# Patient Record
Sex: Male | Born: 1990
Health system: Southern US, Community
[De-identification: ages and names within clinical notes are randomized; demographics above are authoritative.]

## PROBLEM LIST (undated history)

## (undated) DIAGNOSIS — E119 Type 2 diabetes mellitus without complications: Secondary | ICD-10-CM

## (undated) DIAGNOSIS — I10 Essential (primary) hypertension: Secondary | ICD-10-CM

## (undated) DIAGNOSIS — F1410 Cocaine abuse, uncomplicated: Secondary | ICD-10-CM

## (undated) DIAGNOSIS — K6289 Other specified diseases of anus and rectum: Secondary | ICD-10-CM

## (undated) DIAGNOSIS — K219 Gastro-esophageal reflux disease without esophagitis: Secondary | ICD-10-CM

## (undated) DIAGNOSIS — Z21 Asymptomatic human immunodeficiency virus [HIV] infection status: Secondary | ICD-10-CM

## (undated) DIAGNOSIS — B20 Human immunodeficiency virus [HIV] disease: Secondary | ICD-10-CM

## (undated) HISTORY — DX: Type 2 diabetes mellitus without complications: E11.9

## (undated) HISTORY — DX: Essential (primary) hypertension: I10

## (undated) HISTORY — PX: NO PAST SURGERIES: SHX2092

---

## 2010-03-16 ENCOUNTER — Encounter: Admission: RE | Admit: 2010-03-16 | Discharge: 2010-03-16 | Payer: Self-pay | Admitting: Family Medicine

## 2011-11-15 IMAGING — CT CT ABD-PELV W/ CM
2 of 4 series · 14 of 32 positions shown, 19 images · IV contrast (30CC OMNI 300 & [ID] OMNI 300)
Comparison: None.

CLINICAL DATA: Abdominal pain and bloating, evaluate for
obstruction

CT ABDOMEN AND PELVIS WITH CONTRAST
TECHNIQUE: Multidetector CT imaging of the abdomen and pelvis was
performed following the standard protocol during bolus
administration of intravenous contrast.
Contrast: 100 ml 5mnipaque-IEE

[Series 2: abdomen w/ · axial · 0.62mm/px · z∈[-305,+30]mm · 7 of 91 slices shown, 12 images]
[im 12/91  soft-tissue]
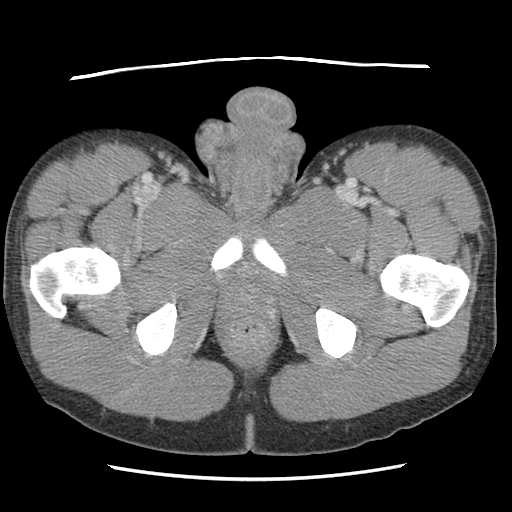
[im 12/91  bone]
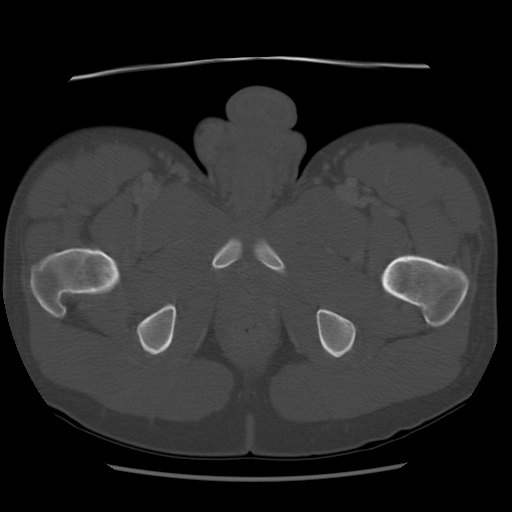
[im 23/91  soft-tissue]
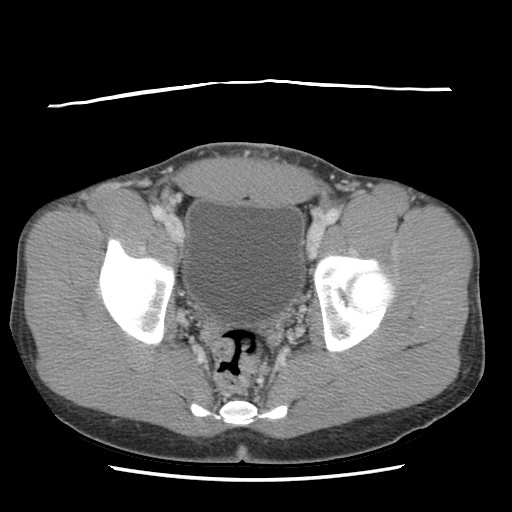
[im 34/91  soft-tissue]
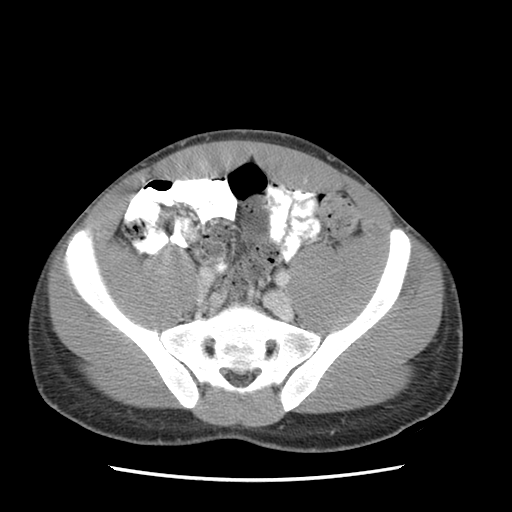
[im 46/91  soft-tissue]
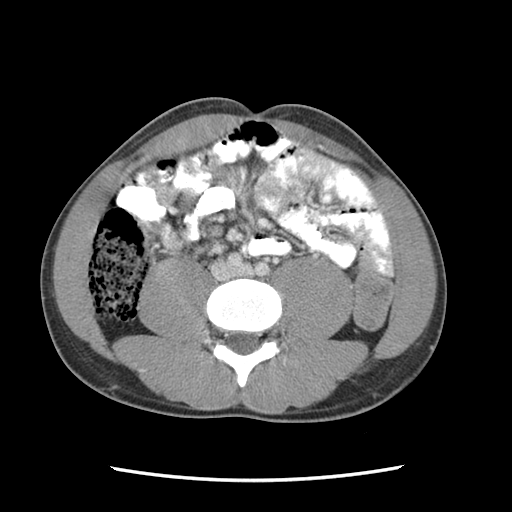
[im 46/91  lung]
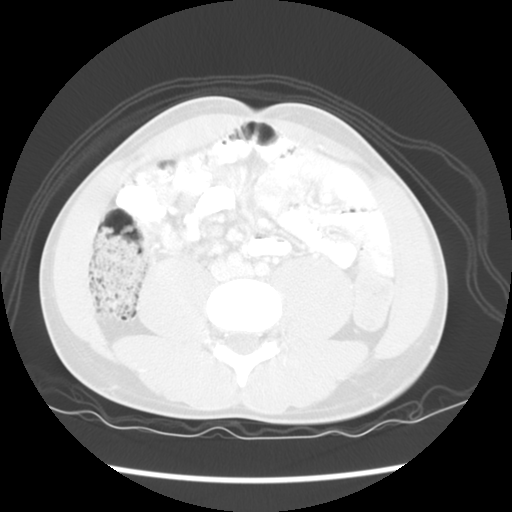
[im 57/91  soft-tissue]
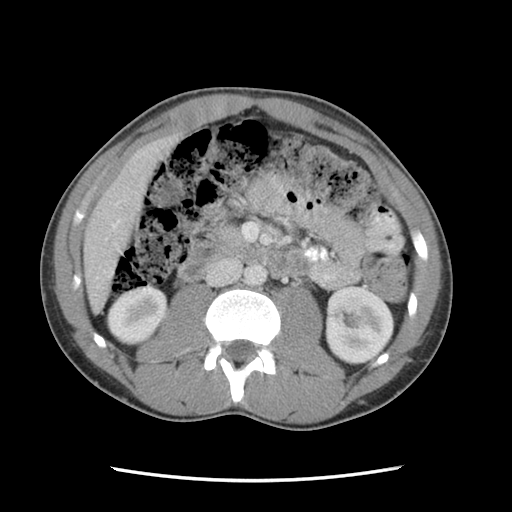
[im 57/91  lung]
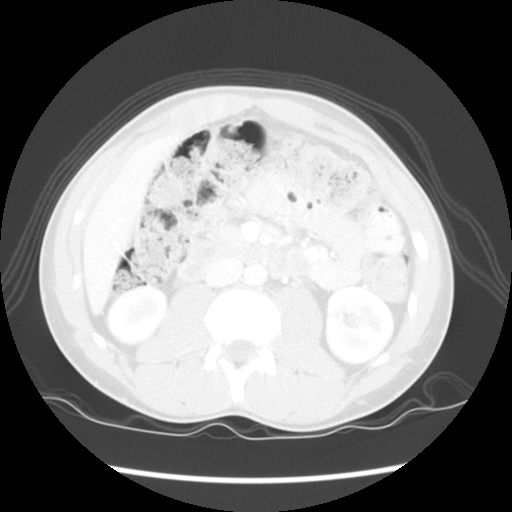
[im 68/91  soft-tissue]
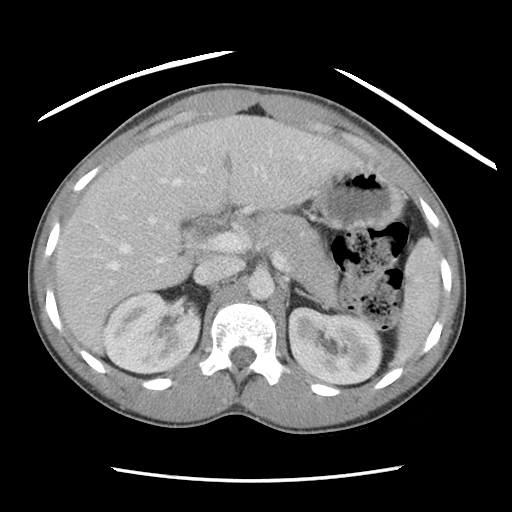
[im 68/91  lung]
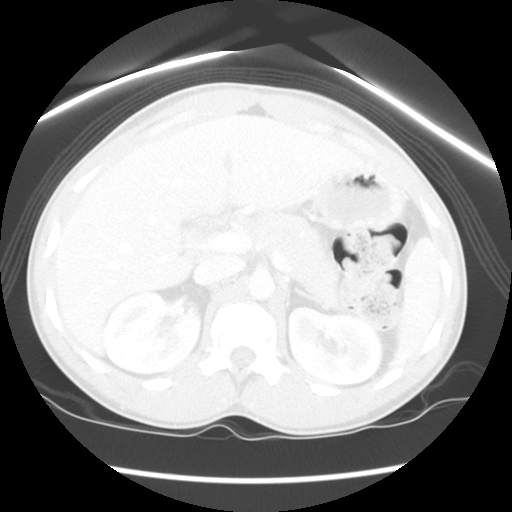
[im 79/91  soft-tissue]
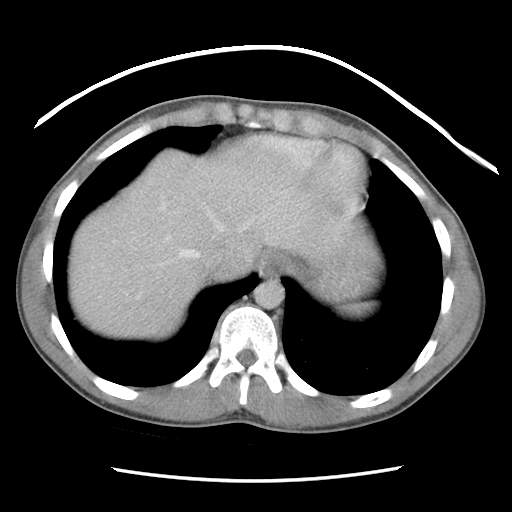
[im 79/91  lung]
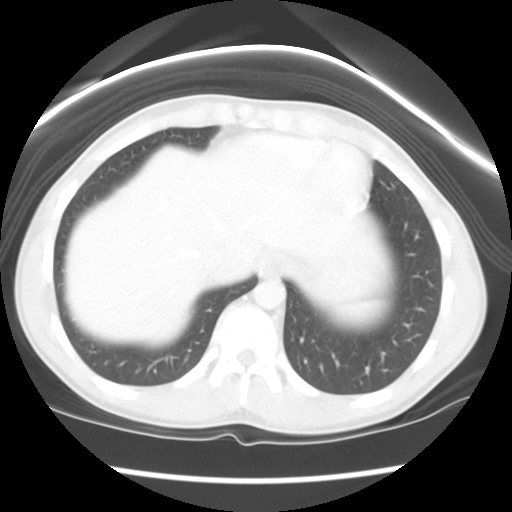

[Series 400: sagittal · sagittal · 0.90mm/px · 7 of 106 slices shown]
[im 11/106  soft-tissue]
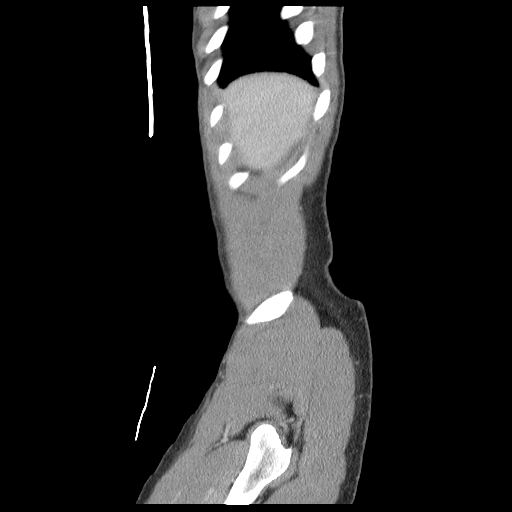
[im 22/106  soft-tissue]
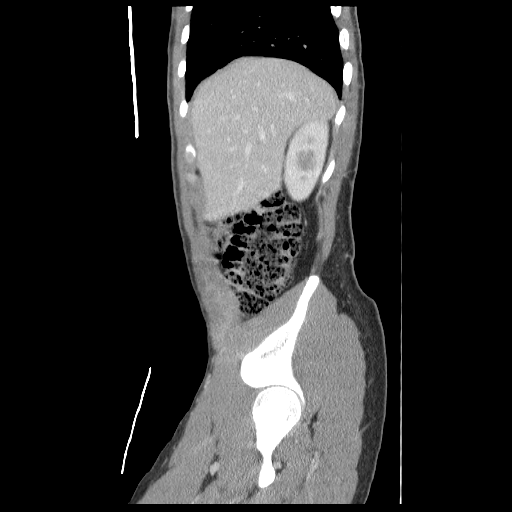
[im 32/106  soft-tissue]
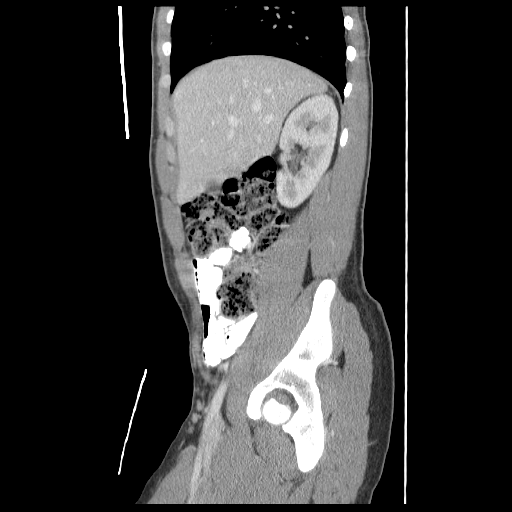
[im 43/106  soft-tissue]
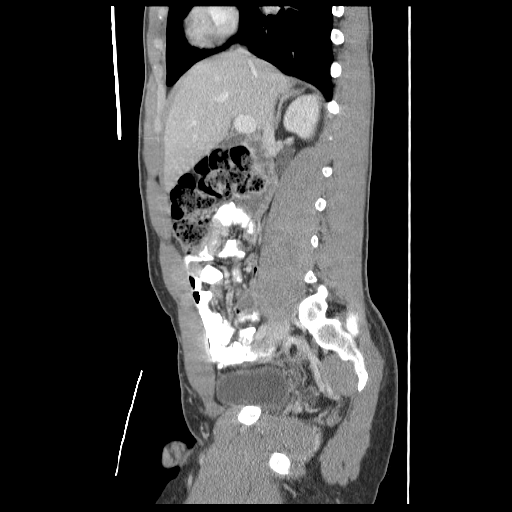
[im 64/106  soft-tissue]
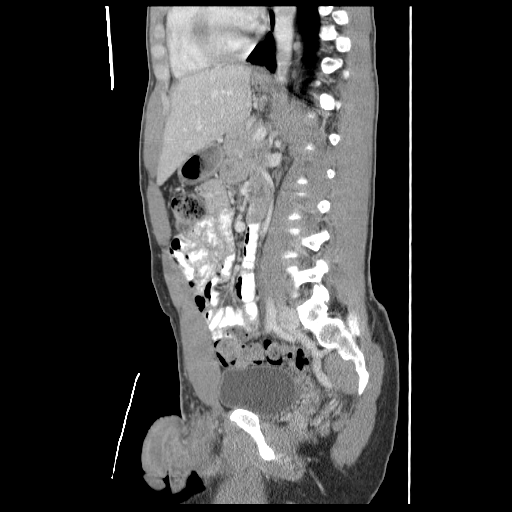
[im 74/106  soft-tissue]
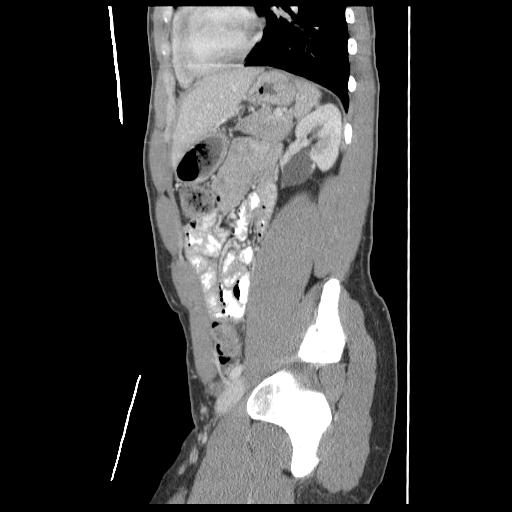
[im 85/106  soft-tissue]
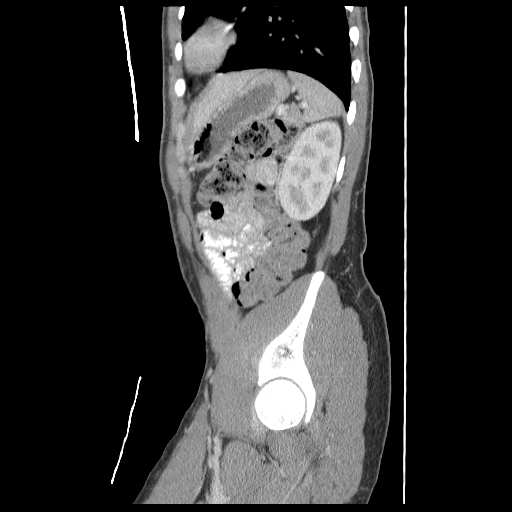

[14 of 32 positions shown; findings below may reference images not displayed]

FINDINGS: The lung bases are clear.  Probable small hemangioma is
noted in the lateral segment left lobe of liver near the dome.  No
ductal dilatation is seen.  No calcified gallstones are noted.  The
pancreas is normal in size and the pancreatic duct is not dilated.
The adrenal glands and spleen are unremarkable.  The stomach is not
well distended but is unremarkable.  The kidneys enhance with no
calculus or mass.  Slight prominence of the pelvocaliceal systems
is noted of questionable significance.  The abdominal aorta is
normal in caliber.  No adenopathy is seen.

There is a moderate amount of feces throughout the entire colon.
No small bowel obstruction is evident.  The appendix fills with air
in the right lower quadrant and appears normal.  There is some
fecalization of distal small bowel loops.  Without evidence of
obstruction this finding may indicate slow transit time.  The
urinary bladder is unremarkable.  The prostate is normal in size.
No fluid is seen within the pelvis.  No bony abnormality is seen.
IMPRESSION: 1.  Moderate amount of feces throughout the colon.  No evidence of
bowel obstruction.
2.  Some fecalization of distal small bowel loops.  With no
evidence of obstruction, this may indicate slow transit time
through the small bowel.
3.  Slight prominence of the pelvocaliceal systems of doubtful
significance.  No renal calculi.
4.  The appendix appears normal.

## 2012-02-27 ENCOUNTER — Ambulatory Visit (INDEPENDENT_AMBULATORY_CARE_PROVIDER_SITE_OTHER): Payer: BC Managed Care – PPO | Admitting: Family Medicine

## 2012-02-27 ENCOUNTER — Ambulatory Visit: Payer: BC Managed Care – PPO

## 2012-02-27 VITALS — BP 110/60 | HR 64 | Temp 98.3°F | Resp 16 | Ht 65.5 in | Wt 118.4 lb

## 2012-02-27 DIAGNOSIS — S99929A Unspecified injury of unspecified foot, initial encounter: Secondary | ICD-10-CM

## 2012-02-27 DIAGNOSIS — IMO0002 Reserved for concepts with insufficient information to code with codable children: Secondary | ICD-10-CM

## 2012-02-27 DIAGNOSIS — S8990XA Unspecified injury of unspecified lower leg, initial encounter: Secondary | ICD-10-CM

## 2012-02-27 MED ORDER — SULFAMETHOXAZOLE-TRIMETHOPRIM 800-160 MG PO TABS: 1.0000 | ORAL_TABLET | Freq: Two times a day (BID) | ORAL | Status: DC

## 2012-02-27 NOTE — Progress Notes (Signed)
Subjective: 21 year old male who dropped a bed frame on the right fifth toe Friday. Over the past 3 days worse. He's been soaking in Epsom salts. It's painful.  Objective: Swollen right fifth toe with paronychia on the medial aspect of the base of the nail.  Assessment: Fifth toe pain and infection  Plan: X-ray Plan to drain the paronychia.  I and D done with ethyl chloride to reduce pain. Culture taken

## 2012-02-27 NOTE — Patient Instructions (Addendum)
Try to elevate foot when possible.  Take full course of antibiotics over next 10 days.    Thursday before 3 PM  return for recheck

## 2012-03-01 LAB — WOUND CULTURE: Gram Stain: NONE SEEN

## 2013-08-08 ENCOUNTER — Emergency Department (INDEPENDENT_AMBULATORY_CARE_PROVIDER_SITE_OTHER)
Admission: EM | Admit: 2013-08-08 | Discharge: 2013-08-08 | Disposition: A | Discharge status: Home / Self Care | Payer: BC Managed Care – PPO | Source: Home / Self Care | Attending: Emergency Medicine | Admitting: Emergency Medicine

## 2013-08-08 ENCOUNTER — Encounter (HOSPITAL_COMMUNITY): Payer: Self-pay | Admitting: Emergency Medicine

## 2013-08-08 ENCOUNTER — Other Ambulatory Visit (HOSPITAL_COMMUNITY)
Admission: RE | Admit: 2013-08-08 | Discharge: 2013-08-08 | Disposition: A | Discharge status: Home / Self Care | Payer: BC Managed Care – PPO | Source: Ambulatory Visit | Attending: Emergency Medicine | Admitting: Emergency Medicine

## 2013-08-08 DIAGNOSIS — Z113 Encounter for screening for infections with a predominantly sexual mode of transmission: Secondary | ICD-10-CM | POA: Insufficient documentation

## 2013-08-08 DIAGNOSIS — Z202 Contact with and (suspected) exposure to infections with a predominantly sexual mode of transmission: Secondary | ICD-10-CM

## 2013-08-08 HISTORY — DX: Human immunodeficiency virus (HIV) disease: B20

## 2013-08-08 HISTORY — DX: Asymptomatic human immunodeficiency virus (hiv) infection status: Z21

## 2013-08-08 MED ORDER — CEFTRIAXONE SODIUM 250 MG IJ SOLR
250.0000 mg | Freq: Once | INTRAMUSCULAR | Status: AC
  Administered 2013-08-08: 250 mg via INTRAMUSCULAR

## 2013-08-08 MED ORDER — AZITHROMYCIN 250 MG PO TABS
ORAL_TABLET | ORAL | Status: AC
  Filled 2013-08-08: qty 4

## 2013-08-08 MED ORDER — AZITHROMYCIN 250 MG PO TABS
1000.0000 mg | ORAL_TABLET | Freq: Once | ORAL | Status: AC
  Administered 2013-08-08: 1000 mg via ORAL

## 2013-08-08 MED ORDER — LIDOCAINE HCL (PF) 1 % IJ SOLN
INTRAMUSCULAR | Status: AC
  Filled 2013-08-08: qty 5

## 2013-08-08 MED ORDER — CEFTRIAXONE SODIUM 250 MG IJ SOLR
INTRAMUSCULAR | Status: AC
  Filled 2013-08-08: qty 250

## 2013-08-08 NOTE — Discharge Instructions (Signed)
You have been diagnosed with a possible STD.  Your results should be back in  1 - 3 days.  We will call you with the results of any positive tests, so if you don't hear from us, you can assume your results are all negative.  If you wish, you can call us here at [phone] and ask for a nurse to give you the results.  They can give you the results of all your tests over the phone.  If your HIV should come back positive, we must give you this result in person to protect your confidentiality.  We can give you a negative HIV result over the phone.   ° °In the meantime, you should avoid intercourse altogether for 1 week.  After that, you should always use condoms--100% of the time.  This will not only prevent pregnancy, but has been shown to prevent HIV, syphilis, gonorrhea, chlamydia, hepatis C and other STDs. ° °If your test comes back positive, we are required by law to report it to the Health Department.  We also suggest you inform your partner or partners so they can get tested and treated as well. ° °You can get STD testing for free at the [facility].  It is recommended that you have repeat testing for HIV and syphilis in 3 and 6 months, since it can take a while for these tests to become positive.  ° °

## 2013-08-08 NOTE — ED Provider Notes (Signed)
  Chief Complaint   Chief Complaint  Patient presents with  . Exposure to STD    History of Present Illness   Cory Russell is -year-old male who is in tonight with his partner. His partner has had some urethral discharge. The patient denies any urethral symptoms but has had some sore throat. He denies any fever or chills. He's had no skin rash or joint pain. No dysuria, penile lesions, inguinal adenopathy, or testicular pain or swelling. He denies any previous STDs. He is HIV positive and is on Stribild. He is being followed by infectious disease specialist in [place], [place] and has not transferred his care here yet.  Review of Systems   Other than as noted above, the patient denies any of the following symptoms: Systemic:  No fevers chills, arthralgias, or adenopathy. GI:  No abdominal pain, nausea or vomiting. GU:  No dysuria, penile pain, discharge, itching, dysuria, genital lesions, testicular pain or swelling. Skin:  No rash or itching.  PMFSH   Past medical history, family history, social history, meds, and allergies were reviewed.   Physical Examination    Vital signs:  BP 128/85  Pulse 63  Temp(Src) 98 F (36.7 C) (Oral)  Resp 16  SpO2 100% Gen:  Alert, oriented, in no distress. ENT: Tonsils are enlarged and red without any exudate. Abdomen:  Soft and flat, non-distended, and non-tender.  No organomegaly or mass. Genital:  No urethral discharge, no genital lesions, no inguinal adenopathy, testes are normal. Skin:  Warm and dry.  No rash.   Labs   Throat swab was obtained for DNA probe for gonorrhea and Chlamydia, and urine obtained for DNA probe for gonorrhea, Chlamydia, Trichomonas.  Course in Urgent Care Center   Given Rocephin 250 mg IM and azithromycin 1000 mg by mouth.   Assessment   The encounter diagnosis was Exposure to STD.  Plan    1.  Meds:  The following meds were prescribed:   New Prescriptions   No medications on file    2.   Patient Education/Counseling:  The patient was given appropriate handouts, self care instructions, and instructed in symptomatic relief.The patient was instructed to inform all sexual contacts, avoid intercourse completely for 2 weeks and then only with a condom.  The patient was told that we would call about all abnormal lab results, and that we would need to report certain kinds of infection to the health department.    3.  Follow up:  The patient was told to follow up here if no better in 3 to 4 days, or sooner if becoming worse in any way, and given some red flag symptoms such as fever, pain, or difficulty urinating which would prompt immediate return.       Reuben Likesavid C Holdan Stucke, MD 08/08/13 2032

## 2013-08-08 NOTE — ED Notes (Signed)
Denies any symptoms, possible exoposure

## 2013-08-09 LAB — GC/CHLAMYDIA PROBE AMP
CT PROBE, AMP APTIMA: NEGATIVE
GC Probe RNA: NEGATIVE

## 2013-08-09 LAB — URINE CYTOLOGY ANCILLARY ONLY
CHLAMYDIA, DNA PROBE: NEGATIVE
Neisseria Gonorrhea: NEGATIVE
Trichomonas: NEGATIVE

## 2013-08-09 NOTE — Progress Notes (Signed)
Quick Note:  Test result was normal. No further action is needed at this time. ______ 

## 2013-10-28 IMAGING — CR DG TOE 5TH 2+V*R*
1 series · 1 of 1 positions shown · non-contrast
Comparison: None.

CLINICAL DATA: Right small toe pain.

RIGHT FIFTH TOE

[AP]
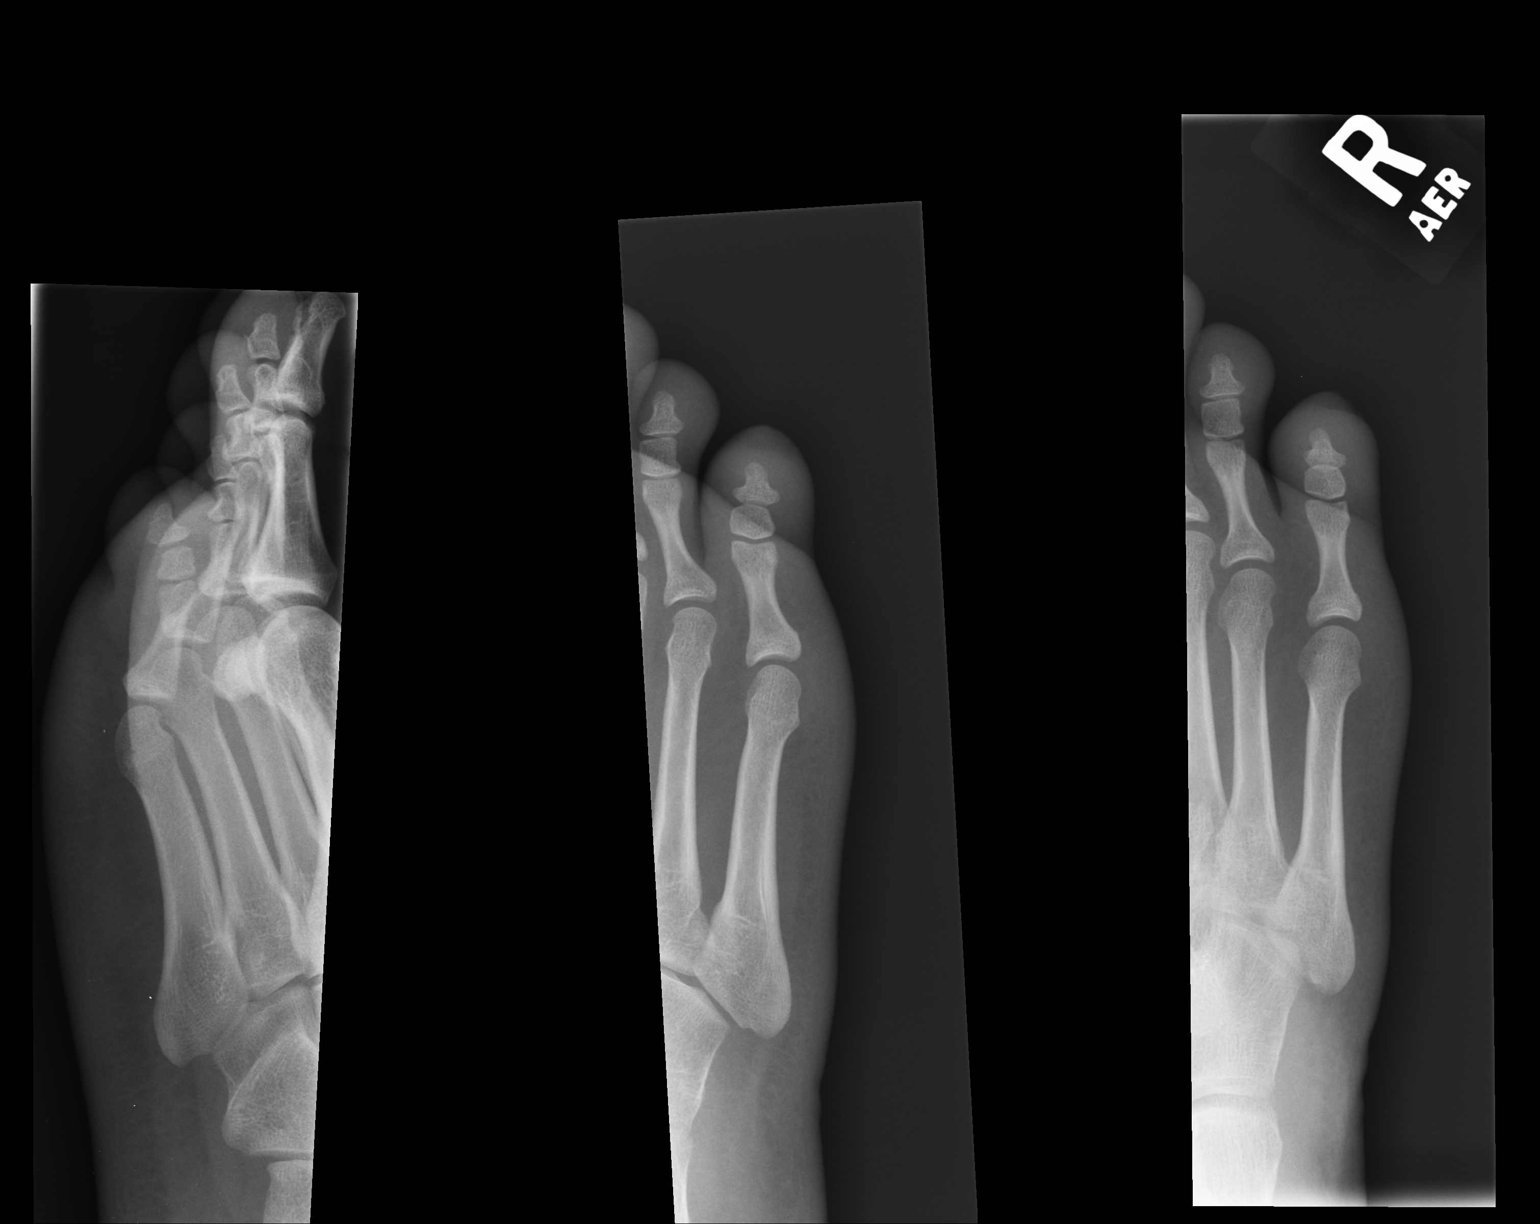

[1 of 1 positions shown; findings below may reference images not displayed]

FINDINGS: Anatomic alignment.  No fracture.  Soft tissue swelling
along the right small toe.  Phalanges appear intact.
IMPRESSION: No osseous injury.  Soft tissue swelling over the right small toe.

## 2013-11-27 ENCOUNTER — Telehealth: Payer: Self-pay

## 2013-11-27 NOTE — Telephone Encounter (Signed)
Patient contacted regarding new intake appointment. Date and time given. Information given regarding documents needed to qualify for financial eligibility.  Pt is insured.  Laurell Josephsammy K King, RN

## 2013-12-02 ENCOUNTER — Ambulatory Visit (INDEPENDENT_AMBULATORY_CARE_PROVIDER_SITE_OTHER): Payer: BC Managed Care – PPO

## 2013-12-02 DIAGNOSIS — Z79899 Other long term (current) drug therapy: Secondary | ICD-10-CM

## 2013-12-02 DIAGNOSIS — Z113 Encounter for screening for infections with a predominantly sexual mode of transmission: Secondary | ICD-10-CM

## 2013-12-02 DIAGNOSIS — B20 Human immunodeficiency virus [HIV] disease: Secondary | ICD-10-CM

## 2013-12-02 LAB — CBC WITH DIFFERENTIAL/PLATELET
BASOS PCT: 0 % (ref 0–1)
Basophils Absolute: 0 10*3/uL (ref 0.0–0.1)
EOS ABS: 0 10*3/uL (ref 0.0–0.7)
Eosinophils Relative: 1 % (ref 0–5)
HCT: 39.1 % (ref 39.0–52.0)
HEMOGLOBIN: 13.4 g/dL (ref 13.0–17.0)
Lymphocytes Relative: 63 % — ABNORMAL HIGH (ref 12–46)
Lymphs Abs: 1.8 10*3/uL (ref 0.7–4.0)
MCH: 29.8 pg (ref 26.0–34.0)
MCHC: 34.3 g/dL (ref 30.0–36.0)
MCV: 87.1 fL (ref 78.0–100.0)
MONO ABS: 0.4 10*3/uL (ref 0.1–1.0)
MONOS PCT: 13 % — AB (ref 3–12)
NEUTROS PCT: 23 % — AB (ref 43–77)
Neutro Abs: 0.6 10*3/uL — ABNORMAL LOW (ref 1.7–7.7)
Platelets: 216 10*3/uL (ref 150–400)
RBC: 4.49 MIL/uL (ref 4.22–5.81)
RDW: 15.4 % (ref 11.5–15.5)
WBC: 2.8 10*3/uL — ABNORMAL LOW (ref 4.0–10.5)

## 2013-12-02 LAB — URINALYSIS
BILIRUBIN URINE: NEGATIVE
GLUCOSE, UA: NEGATIVE mg/dL
HGB URINE DIPSTICK: NEGATIVE
Ketones, ur: NEGATIVE mg/dL
Leukocytes, UA: NEGATIVE
Nitrite: NEGATIVE
PROTEIN: NEGATIVE mg/dL
Specific Gravity, Urine: 1.029 (ref 1.005–1.030)
Urobilinogen, UA: 1 mg/dL (ref 0.0–1.0)
pH: 6 (ref 5.0–8.0)

## 2013-12-02 LAB — COMPLETE METABOLIC PANEL WITH GFR
ALK PHOS: 56 U/L (ref 39–117)
ALT: 15 U/L (ref 0–53)
AST: 20 U/L (ref 0–37)
Albumin: 4.4 g/dL (ref 3.5–5.2)
BILIRUBIN TOTAL: 0.8 mg/dL (ref 0.2–1.2)
BUN: 14 mg/dL (ref 6–23)
CO2: 30 mEq/L (ref 19–32)
CREATININE: 1.06 mg/dL (ref 0.50–1.35)
Calcium: 9.3 mg/dL (ref 8.4–10.5)
Chloride: 103 mEq/L (ref 96–112)
GFR, Est African American: >89 mL/min
GLUCOSE: 78 mg/dL (ref 70–99)
Potassium: 4.8 mEq/L (ref 3.5–5.3)
SODIUM: 140 meq/L (ref 135–145)
Total Protein: 6.9 g/dL (ref 6.0–8.3)

## 2013-12-02 LAB — LIPID PANEL
CHOL/HDL RATIO: 3.5 ratio
CHOLESTEROL: 134 mg/dL (ref 0–200)
HDL: 38 mg/dL — AB (ref >39)
LDL Cholesterol: 84 mg/dL (ref 0–99)
Triglycerides: 62 mg/dL (ref <150)
VLDL: 12 mg/dL (ref 0–40)

## 2013-12-02 LAB — RPR

## 2013-12-02 NOTE — Progress Notes (Signed)
Patient is here today for New patient 042 intake, he is transferring from [facility] in [place], [place]. He reports being off  medications for 2 months because he found a new job and was unable to schedule a visit with them to get refills while waiting [facility]. He has been HIV positive since 10/2008 last tested negative in 2009. He reports some fatigue today and a little stress. He is anxious to get started on medication. Patient tested positive in the past for syphilis, which led to a lumbar puncture to rule out neurosyphilis. Patient reports that was negative and he states he was treated with 3 shots. Records received and vaccines updated.

## 2013-12-03 LAB — HEPATITIS A ANTIBODY, TOTAL: Hep A Total Ab: REACTIVE — AB

## 2013-12-03 LAB — T-HELPER CELL (CD4) - (RCID CLINIC ONLY)
CD4 % Helper T Cell: 21 % — ABNORMAL LOW (ref 33–55)
CD4 T Cell Abs: 430 /uL (ref 400–2700)

## 2013-12-03 LAB — HEPATITIS C ANTIBODY: HCV Ab: NEGATIVE

## 2013-12-03 LAB — HEPATITIS B SURFACE ANTIBODY,QUALITATIVE: HEP B S AB: POSITIVE — AB

## 2013-12-03 LAB — HEPATITIS B SURFACE ANTIGEN: HEP B S AG: NEGATIVE

## 2013-12-03 LAB — HEPATITIS B CORE ANTIBODY, TOTAL: HEP B C TOTAL AB: NONREACTIVE

## 2013-12-04 LAB — HIV-1 RNA ULTRAQUANT REFLEX TO GENTYP+
HIV 1 RNA QUANT: 1917 {copies}/mL — AB (ref <20)
HIV-1 RNA QUANT, LOG: 3.28 {Log} — AB (ref <1.30)

## 2013-12-09 LAB — HLA B*5701: HLA-B*5701 w/rflx HLA-B High: NEGATIVE

## 2013-12-16 LAB — HIV-1 GENOTYPR PLUS

## 2013-12-20 ENCOUNTER — Telehealth: Payer: Self-pay | Admitting: *Deleted

## 2013-12-20 ENCOUNTER — Encounter: Payer: Self-pay | Admitting: Internal Medicine

## 2013-12-20 ENCOUNTER — Ambulatory Visit (INDEPENDENT_AMBULATORY_CARE_PROVIDER_SITE_OTHER): Payer: BC Managed Care – PPO | Admitting: Internal Medicine

## 2013-12-20 VITALS — BP 130/75 | HR 64 | Temp 98.6°F | Ht 66.5 in | Wt 125.0 lb

## 2013-12-20 DIAGNOSIS — B20 Human immunodeficiency virus [HIV] disease: Secondary | ICD-10-CM

## 2013-12-20 MED ORDER — ELVITEG-COBIC-EMTRICIT-TENOFDF 150-150-200-300 MG PO TABS: 1.0000 | ORAL_TABLET | Freq: Every day | ORAL | Status: DC

## 2013-12-20 NOTE — Progress Notes (Signed)
   Subjective:    Patient ID: Cory Russell, male    DOB: 05/30/1991, 23 y.o.   MRN: [id number]  HPI Here as a new patient with HIV.  Was diagnosed in 2012 and has been previously on Atripla, but developed resistance, then a 4 and 3 drug regimen (presumed PI), then started on Stribild about September of 2013.  Had been on that with good suppression but last took it February 2015.  Has not followed up with his ID doc since then since it is out of town and he now lives here.  He had been attending [employer] and now working here.  Some night sweats the last few weeks.  History of syphilis 2 years ago, no gc, no chlamydia, no HSV.  No weight loss, no diarrhea. Unknown CD4 nadir, prior viral load maximum.     Review of Systems  Constitutional: Negative for fever, chills, fatigue and unexpected weight change.  HENT: Negative for trouble swallowing.   Respiratory: Negative for shortness of breath.   Gastrointestinal: Negative for nausea and diarrhea.  Musculoskeletal: Negative for arthralgias.  Skin: Negative for rash.  Neurological: Negative for dizziness and light-headedness.  Hematological: Negative for adenopathy.  Psychiatric/Behavioral: Negative for dysphoric mood.       Objective:   Physical Exam  Constitutional: He appears well-developed and well-nourished. No distress.  HENT:  Mouth/Throat: No oropharyngeal exudate.  Eyes: Right eye exhibits no discharge. Left eye exhibits no discharge. No scleral icterus.  Cardiovascular: Normal rate, regular rhythm and normal heart sounds.   No murmur heard. Pulmonary/Chest: Effort normal and breath sounds normal. No respiratory distress. He has no wheezes.  Abdominal: Soft. Bowel sounds are normal. He exhibits no distension and no mass. There is no tenderness. There is no rebound.  Genitourinary: Penis normal.  Musculoskeletal: He exhibits no edema.  Lymphadenopathy:    He has no cervical adenopathy.  Skin: Skin is warm and dry. No rash noted.    Psychiatric: He has a normal mood and affect.          Assessment & Plan:

## 2013-12-20 NOTE — Telephone Encounter (Signed)
Called patient and left a voice mail if he needs a copay card for the Stribild he can pick one up at this office. Cory MolaJacqueline Joclynn Lumb

## 2013-12-20 NOTE — Assessment & Plan Note (Addendum)
Will restart Stribild and hopefully no resistance.  I discussed issues of resistance with stopping and starting and he has already experienced this.   Will check labs in 3 weeks and follow up after that.   Previous records reviewed.

## 2014-01-06 ENCOUNTER — Other Ambulatory Visit: Payer: BC Managed Care – PPO

## 2014-01-14 ENCOUNTER — Ambulatory Visit: Payer: BC Managed Care – PPO | Admitting: Internal Medicine

## 2014-02-05 ENCOUNTER — Other Ambulatory Visit: Payer: BC Managed Care – PPO

## 2014-02-27 ENCOUNTER — Ambulatory Visit: Payer: BC Managed Care – PPO | Admitting: Internal Medicine

## 2014-03-26 ENCOUNTER — Encounter (HOSPITAL_COMMUNITY): Payer: Self-pay | Admitting: Emergency Medicine

## 2014-03-26 ENCOUNTER — Emergency Department (INDEPENDENT_AMBULATORY_CARE_PROVIDER_SITE_OTHER)
Admission: EM | Admit: 2014-03-26 | Discharge: 2014-03-26 | Disposition: A | Discharge status: Home / Self Care | Payer: BC Managed Care – PPO | Source: Home / Self Care | Attending: Family Medicine | Admitting: Family Medicine

## 2014-03-26 DIAGNOSIS — K600 Acute anal fissure: Secondary | ICD-10-CM

## 2014-03-26 MED ORDER — DILTIAZEM GEL 2 %: 1.0000 "application " | Freq: Three times a day (TID) | CUTANEOUS | Status: DC

## 2014-03-26 NOTE — ED Provider Notes (Signed)
CSN: [id number]     Arrival date & time 03/26/14  1621 History   First MD Initiated Contact with Patient 03/26/14 1655     Chief Complaint  Patient presents with  . Rectal Pain   (Consider location/radiation/quality/duration/timing/severity/associated sxs/prior Treatment) Patient is a 23 y.o. male presenting with hematochezia. The history is provided by the patient.  Rectal Bleeding Quality:  Bright red Amount:  Scant Chronicity:  Recurrent Context: constipation and hemorrhoids   Similar prior episodes: yes   Relieved by:  None tried Associated symptoms: no abdominal pain and no vomiting   Risk factors comment:  HIV DISEASE.   Past Medical History  Diagnosis Date  . HIV (human immunodeficiency virus infection)   . Hypertension   . Diabetes mellitus without complication    History reviewed. No pertinent past surgical history. Family History  Problem Relation Age of Onset  . Diabetes Mother   . Hypertension Father    History  Substance Use Topics  . Smoking status: Never Smoker   . Smokeless tobacco: Never Used  . Alcohol Use: Yes     Comment: occasional    Review of Systems  Gastrointestinal: Positive for constipation, blood in stool, hematochezia and anal bleeding. Negative for nausea, vomiting, abdominal pain and diarrhea.    Allergies  Review of patient's allergies indicates no known allergies.  Home Medications   Prior to Admission medications   Medication Sig Start Date End Date Taking? Authorizing Provider  diltiazem 2 % GEL Apply 1 application topically 3 (three) times daily. 03/26/14   Linna HoffJames D Kindl, MD  elvitegravir-cobicistat-emtricitabine-tenofovir (STRIBILD) 150-150-200-300 MG TABS tablet Take 1 tablet by mouth daily. 12/20/13   Gardiner Barefootobert W Comer, MD   BP 119/68  Pulse 90  Temp(Src) 99.4 F (37.4 C) (Oral)  SpO2 96% Physical Exam  Nursing note and vitals reviewed. Constitutional: He is oriented to person, place, and time. He appears well-developed  and well-nourished. No distress.  Abdominal: Soft. Bowel sounds are normal.  Genitourinary:  No hemorrhoids int or ext, painful fissure to palp., no bleeding.  Neurological: He is alert and oriented to person, place, and time.  Skin: Skin is warm.    ED Course  Procedures (including critical care time) Labs Review Labs Reviewed - No data to display  Imaging Review No results found.   MDM   1. Acute anal fissure        Linna HoffJames D Kindl, MD 03/26/14 [phone]

## 2014-03-26 NOTE — ED Notes (Signed)
C/o rectal pain and bleeding since 10-16. History of prior hemorrhoids , and has been using warm sitz baths, preparation H for symptoms. C/o has been having chills

## 2014-03-26 NOTE — ED Notes (Signed)
Had lengthy discussion w patient regarding his route to heal

## 2014-03-27 MED ORDER — DILTIAZEM GEL 2 %: 1.0000 "application " | Freq: Three times a day (TID) | CUTANEOUS | Status: DC

## 2014-06-11 ENCOUNTER — Other Ambulatory Visit: Payer: BC Managed Care – PPO

## 2014-06-11 DIAGNOSIS — B20 Human immunodeficiency virus [HIV] disease: Secondary | ICD-10-CM

## 2014-06-12 LAB — T-HELPER CELL (CD4) - (RCID CLINIC ONLY)
CD4 T CELL ABS: 530 /uL (ref 400–2700)
CD4 T CELL HELPER: 20 % — AB (ref 33–55)

## 2014-06-12 LAB — COMPLETE METABOLIC PANEL WITH GFR
ALBUMIN: 4.1 g/dL (ref 3.5–5.2)
ALT: 17 U/L (ref 0–53)
AST: 20 U/L (ref 0–37)
Alkaline Phosphatase: 64 U/L (ref 39–117)
BUN: 16 mg/dL (ref 6–23)
CALCIUM: 9.6 mg/dL (ref 8.4–10.5)
CHLORIDE: 103 meq/L (ref 96–112)
CO2: 29 mEq/L (ref 19–32)
Creat: 1.04 mg/dL (ref 0.50–1.35)
GFR, Est African American: >89 mL/min
GFR, Est Non African American: >89 mL/min
Glucose, Bld: 76 mg/dL (ref 70–99)
POTASSIUM: 4.4 meq/L (ref 3.5–5.3)
SODIUM: 139 meq/L (ref 135–145)
TOTAL PROTEIN: 7.2 g/dL (ref 6.0–8.3)
Total Bilirubin: 0.3 mg/dL (ref 0.2–1.2)

## 2014-06-12 LAB — CBC WITH DIFFERENTIAL/PLATELET
BASOS ABS: 0 10*3/uL (ref 0.0–0.1)
BASOS PCT: 0 % (ref 0–1)
EOS ABS: 0 10*3/uL (ref 0.0–0.7)
EOS PCT: 1 % (ref 0–5)
HCT: 41 % (ref 39.0–52.0)
Hemoglobin: 13.4 g/dL (ref 13.0–17.0)
Lymphocytes Relative: 56 % — ABNORMAL HIGH (ref 12–46)
Lymphs Abs: 2.5 10*3/uL (ref 0.7–4.0)
MCH: 29.1 pg (ref 26.0–34.0)
MCHC: 32.7 g/dL (ref 30.0–36.0)
MCV: 89.1 fL (ref 78.0–100.0)
MONO ABS: 0.6 10*3/uL (ref 0.1–1.0)
MONOS PCT: 13 % — AB (ref 3–12)
MPV: 8.2 fL — ABNORMAL LOW (ref 8.6–12.4)
NEUTROS ABS: 1.3 10*3/uL — AB (ref 1.7–7.7)
Neutrophils Relative %: 30 % — ABNORMAL LOW (ref 43–77)
PLATELETS: 279 10*3/uL (ref 150–400)
RBC: 4.6 MIL/uL (ref 4.22–5.81)
RDW: 15.1 % (ref 11.5–15.5)
WBC: 4.4 10*3/uL (ref 4.0–10.5)

## 2014-06-13 LAB — HIV-1 RNA QUANT-NO REFLEX-BLD
HIV 1 RNA Quant: 2497 copies/mL — ABNORMAL HIGH (ref <20)
HIV-1 RNA Quant, Log: 3.4 {Log} — ABNORMAL HIGH (ref <1.30)

## 2014-06-25 ENCOUNTER — Encounter: Payer: Self-pay | Admitting: Internal Medicine

## 2014-06-25 ENCOUNTER — Ambulatory Visit (INDEPENDENT_AMBULATORY_CARE_PROVIDER_SITE_OTHER): Payer: BC Managed Care – PPO | Admitting: Internal Medicine

## 2014-06-25 VITALS — BP 122/76 | HR 81 | Temp 98.2°F | Wt 122.0 lb

## 2014-06-25 DIAGNOSIS — Z23 Encounter for immunization: Secondary | ICD-10-CM | POA: Diagnosis not present

## 2014-06-25 DIAGNOSIS — B20 Human immunodeficiency virus [HIV] disease: Secondary | ICD-10-CM

## 2014-06-25 NOTE — Progress Notes (Signed)
   Subjective:    Patient ID: Cory ShawlCurtis Russell, male    DOB: Jul 05, 1990, 24 y.o.   MRN: [id number]  HPI Here for his second visit with HIV.  Was diagnosed in 2012 and has been previously on Atripla, but developed resistance, then a 4 and 3 drug regimen (presumed PI), then started on Stribild about September of 2013.  Had been on that with good suppression but last took it February 2015.  Has not followed up with his ID doc since then since it is out of town and he now lives here.  He had been attending [employer] and now working here.  History of syphilis 2 years ago, no gc, no chlamydia, no HSV.  No weight loss, no diarrhea. Unknown CD4 nadir, prior viral load up.  He missed an appointment and now is back about 7 months after his first visit. He again reports sporadic compliance until December when he started taking it daily. Unfortunately, one month on Stribild daily, his viral load has no suppression. Fortunately his CD4 count remains okay at 530.   Review of Systems  Constitutional: Negative for fever, chills, fatigue and unexpected weight change.  HENT: Negative for trouble swallowing.   Respiratory: Negative for shortness of breath.   Gastrointestinal: Negative for nausea and diarrhea.  Musculoskeletal: Negative for arthralgias.  Skin: Negative for rash.  Neurological: Negative for dizziness and light-headedness.  Hematological: Negative for adenopathy.  Psychiatric/Behavioral: Negative for dysphoric mood.       Objective:   Physical Exam  Constitutional: He appears well-developed and well-nourished. No distress.  HENT:  Mouth/Throat: No oropharyngeal exudate.  Eyes: Left eye exhibits no discharge. No scleral icterus.  Cardiovascular: Normal rate, regular rhythm and normal heart sounds.   No murmur heard. Pulmonary/Chest: Effort normal and breath sounds normal. No respiratory distress. He has no wheezes.  Lymphadenopathy:    He has no cervical adenopathy.  Skin: No rash noted.           Assessment & Plan:

## 2014-06-25 NOTE — Addendum Note (Signed)
Addended by: Gardiner BarefootOMER, ROBERT W on: 06/25/2014 04:16 PM   Modules accepted: Orders

## 2014-06-25 NOTE — Assessment & Plan Note (Signed)
I discussed with him at length the need to take the medication daily without missing doses. I again, as it did before, The resistance mechanism and taking it sporadically is worse than stopping altogether. He did voice his understanding of this and that he was already aware of this. He understands the need to take it daily and he is motivated to take daily. I do suspect with no activity and his viral load that he is now resistant and 90 and concern that he has more than 1 drug resistance. I'm going to check a genotype for integrates resistance and other mutations and have him come back after these results and consider a new therapy.

## 2014-06-30 LAB — HIV-1 RNA QUANT-NO REFLEX-BLD
HIV 1 RNA Quant: 1132 copies/mL — ABNORMAL HIGH (ref <20)
HIV-1 RNA Quant, Log: 3.05 {Log} — ABNORMAL HIGH (ref <1.30)

## 2014-07-04 LAB — HIV-1 GENOTYPING (RTI,PI,IN INHBTR): HIV-1 Genotype: DETECTED

## 2014-07-24 ENCOUNTER — Ambulatory Visit (INDEPENDENT_AMBULATORY_CARE_PROVIDER_SITE_OTHER): Payer: BC Managed Care – PPO | Admitting: Internal Medicine

## 2014-07-24 ENCOUNTER — Encounter: Payer: Self-pay | Admitting: Internal Medicine

## 2014-07-24 VITALS — BP 123/86 | HR 77 | Temp 98.3°F | Ht 67.0 in | Wt 127.0 lb

## 2014-07-24 DIAGNOSIS — B20 Human immunodeficiency virus [HIV] disease: Secondary | ICD-10-CM | POA: Diagnosis not present

## 2014-07-24 DIAGNOSIS — Z23 Encounter for immunization: Secondary | ICD-10-CM

## 2014-07-24 MED ORDER — RITONAVIR 100 MG PO TABS: 100.0000 mg | ORAL_TABLET | Freq: Every day | ORAL | Status: DC

## 2014-07-24 MED ORDER — ABACAVIR-DOLUTEGRAVIR-LAMIVUD 600-50-300 MG PO TABS
1.0000 | ORAL_TABLET | Freq: Every day | ORAL | Status: DC
Start: 2014-07-24 — End: 2014-09-19

## 2014-07-24 MED ORDER — DARUNAVIR ETHANOLATE 800 MG PO TABS: 800.0000 mg | ORAL_TABLET | Freq: Every day | ORAL | Status: DC

## 2014-07-25 NOTE — Assessment & Plan Note (Signed)
I discussed the resistance and why he got resistance. He understands it is due to his poor compliance and that he will soon run out of chances if he continues to have poor compliance. He tells me now that he is motivated to take. I did discuss with him different regimens and will start Triumeq with Prezista and Norvir. He prefers the smaller pill size of Prezista with an additional Norvir rather than Prezcobix.  Co-pay cards were provided and he will start his regimen this week and follow-up labs in 1 month.

## 2014-07-25 NOTE — Addendum Note (Signed)
Addended by: Andree CossHOWELL, MICHELLE M on: 07/25/2014 08:54 AM   Modules accepted: Orders

## 2014-07-25 NOTE — Progress Notes (Signed)
   Subjective:    Patient ID: Cory Russell, male    DOB: 25-Jan-1991, 24 y.o.   MRN: [id number]  HPI He is here for a follow-up of HIV.   He was diagnosed in 2012 and has been previously on Atripla, but developed resistance, then a 4 and 3 drug regimen (presumed PI), then started on Stribild about September of 2013.  Had been on that with good suppression but stopped it after not following up.   He again reported last visit sporadic compliance until December when he started taking it daily. 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     Review of Systems  Constitutional: Negative for fever, chills, fatigue and unexpected weight change.  HENT: Negative for trouble swallowing.   Respiratory: Negative for shortness of breath.   Gastrointestinal: Negative for nausea and diarrhea.  Musculoskeletal: Negative for arthralgias.  Skin: Negative for rash.  Neurological: Negative for dizziness and light-headedness.  Hematological: Negative for adenopathy.  Psychiatric/Behavioral: Negative for dysphoric mood.       Objective:   Physical Exam  Constitutional: He appears well-developed and well-nourished. No distress.  HENT:  Mouth/Throat: No oropharyngeal exudate.  Eyes: Left eye exhibits no discharge. No scleral icterus.  Cardiovascular: Normal rate, regular rhythm and normal heart sounds.   No murmur heard. Pulmonary/Chest: Effort normal and breath sounds normal. No respiratory distress. He has no wheezes.  Lymphadenopathy:    He has no cervical adenopathy.  Skin: No rash noted.          Assessment & Plan:

## 2014-08-21 ENCOUNTER — Other Ambulatory Visit: Payer: BC Managed Care – PPO

## 2014-08-21 DIAGNOSIS — B20 Human immunodeficiency virus [HIV] disease: Secondary | ICD-10-CM

## 2014-08-21 LAB — CBC WITH DIFFERENTIAL/PLATELET
Basophils Absolute: 0 10*3/uL (ref 0.0–0.1)
Basophils Relative: 0 % (ref 0–1)
EOS PCT: 1 % (ref 0–5)
Eosinophils Absolute: 0 10*3/uL (ref 0.0–0.7)
HCT: 39 % (ref 39.0–52.0)
HEMOGLOBIN: 12.5 g/dL — AB (ref 13.0–17.0)
LYMPHS PCT: 62 % — AB (ref 12–46)
Lymphs Abs: 2.6 10*3/uL (ref 0.7–4.0)
MCH: 28.5 pg (ref 26.0–34.0)
MCHC: 32.1 g/dL (ref 30.0–36.0)
MCV: 88.8 fL (ref 78.0–100.0)
MPV: 8.7 fL (ref 8.6–12.4)
Monocytes Absolute: 0.4 10*3/uL (ref 0.1–1.0)
Monocytes Relative: 10 % (ref 3–12)
NEUTROS ABS: 1.1 10*3/uL — AB (ref 1.7–7.7)
Neutrophils Relative %: 27 % — ABNORMAL LOW (ref 43–77)
Platelets: 212 10*3/uL (ref 150–400)
RBC: 4.39 MIL/uL (ref 4.22–5.81)
RDW: 15.2 % (ref 11.5–15.5)
WBC: 4.2 10*3/uL (ref 4.0–10.5)

## 2014-08-21 LAB — COMPLETE METABOLIC PANEL WITH GFR
ALK PHOS: 61 U/L (ref 39–117)
ALT: 12 U/L (ref 0–53)
AST: 19 U/L (ref 0–37)
Albumin: 3.9 g/dL (ref 3.5–5.2)
BUN: 8 mg/dL (ref 6–23)
CO2: 26 mEq/L (ref 19–32)
CREATININE: 0.98 mg/dL (ref 0.50–1.35)
Calcium: 9.1 mg/dL (ref 8.4–10.5)
Chloride: 104 mEq/L (ref 96–112)
GFR, Est African American: >89 mL/min
Glucose, Bld: 60 mg/dL — ABNORMAL LOW (ref 70–99)
POTASSIUM: 4 meq/L (ref 3.5–5.3)
Sodium: 140 mEq/L (ref 135–145)
Total Bilirubin: 0.3 mg/dL (ref 0.2–1.2)
Total Protein: 7 g/dL (ref 6.0–8.3)

## 2014-08-22 LAB — T-HELPER CELL (CD4) - (RCID CLINIC ONLY)
CD4 % Helper T Cell: 20 % — ABNORMAL LOW (ref 33–55)
CD4 T Cell Abs: 530 /uL (ref 400–2700)

## 2014-08-23 LAB — HIV-1 RNA QUANT-NO REFLEX-BLD: HIV-1 RNA Quant, Log: <1.3 {Log} (ref <1.30)

## 2014-08-28 ENCOUNTER — Ambulatory Visit (INDEPENDENT_AMBULATORY_CARE_PROVIDER_SITE_OTHER): Payer: BC Managed Care – PPO | Admitting: Internal Medicine

## 2014-08-28 ENCOUNTER — Encounter: Payer: Self-pay | Admitting: Internal Medicine

## 2014-08-28 VITALS — BP 111/74 | HR 65 | Temp 98.2°F | Ht 67.0 in | Wt 130.0 lb

## 2014-08-28 DIAGNOSIS — R630 Anorexia: Secondary | ICD-10-CM | POA: Diagnosis not present

## 2014-08-28 DIAGNOSIS — Z113 Encounter for screening for infections with a predominantly sexual mode of transmission: Secondary | ICD-10-CM | POA: Diagnosis not present

## 2014-08-28 DIAGNOSIS — B20 Human immunodeficiency virus [HIV] disease: Secondary | ICD-10-CM | POA: Diagnosis not present

## 2014-08-28 DIAGNOSIS — Z79899 Other long term (current) drug therapy: Secondary | ICD-10-CM | POA: Diagnosis not present

## 2014-08-28 MED ORDER — MEGESTROL ACETATE 625 MG/5ML PO SUSP: 625.0000 mg | Freq: Every day | ORAL | Status: DC

## 2014-08-28 NOTE — Progress Notes (Signed)
   Subjective:    Patient ID: Cory Russell, male    DOB: 1991-04-18, 24 y.o.   MRN: [id number]  HPI He is here for a follow-up of HIV.   He was diagnosed in 2012 and has been previously on Atripla, but developed resistance, then a 4 and 3 drug regimen (presumed PI), then started on Stribild about September of 2013.  Had been on that with good suppression but stopped it after not following up.   He again reported last visit sporadic compliance until December when he started taking it daily. 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  I then changed him to Prezista, norvir, [employer].  He now has been on it for more than a month and viral load now undetectable, CD4 the same at 530.  No complaints. No missed doses.     Review of Systems  Constitutional: Negative for fever, chills, fatigue and unexpected weight change.  HENT: Negative for trouble swallowing.   Respiratory: Negative for shortness of breath.   Gastrointestinal: Negative for nausea and diarrhea.  Musculoskeletal: Negative for arthralgias.  Skin: Negative for rash.  Neurological: Negative for dizziness and light-headedness.  Hematological: Negative for adenopathy.  Psychiatric/Behavioral: Negative for dysphoric mood.       Objective:   Physical Exam  Constitutional: He appears well-developed and well-nourished. No distress.  HENT:  Mouth/Throat: No oropharyngeal exudate.  Eyes: Left eye exhibits no discharge. No scleral icterus.  Cardiovascular: Normal rate, regular rhythm and normal heart sounds.   No murmur heard. Pulmonary/Chest: Effort normal and breath sounds normal. No respiratory distress. He has no wheezes.  Lymphadenopathy:    He has no cervical adenopathy.  Skin: No rash noted.          Assessment & Plan:

## 2014-08-28 NOTE — Assessment & Plan Note (Signed)
He does not eat much and I will try Megace to see if that helps stimulate his appetite.

## 2014-08-28 NOTE — Assessment & Plan Note (Signed)
He is doing great on this regimen pleased with it. He will get labs again in 3 months and I did emphasize again compliance.

## 2014-09-19 ENCOUNTER — Other Ambulatory Visit: Payer: Self-pay | Admitting: *Deleted

## 2014-09-19 DIAGNOSIS — B20 Human immunodeficiency virus [HIV] disease: Secondary | ICD-10-CM

## 2014-09-19 MED ORDER — RITONAVIR 100 MG PO TABS: 100.0000 mg | ORAL_TABLET | Freq: Every day | ORAL | Status: DC

## 2014-09-19 MED ORDER — DARUNAVIR ETHANOLATE 800 MG PO TABS: 800.0000 mg | ORAL_TABLET | Freq: Every day | ORAL | Status: DC

## 2014-09-19 MED ORDER — ABACAVIR-DOLUTEGRAVIR-LAMIVUD 600-50-300 MG PO TABS: 1.0000 | ORAL_TABLET | Freq: Every day | ORAL | Status: DC

## 2014-09-19 MED ORDER — MEGESTROL ACETATE 625 MG/5ML PO SUSP: 625.0000 mg | Freq: Every day | ORAL | Status: DC

## 2014-10-23 ENCOUNTER — Ambulatory Visit (INDEPENDENT_AMBULATORY_CARE_PROVIDER_SITE_OTHER): Payer: BC Managed Care – PPO | Admitting: Internal Medicine

## 2014-10-23 VITALS — BP 130/82 | HR 76 | Temp 98.2°F | Wt 116.0 lb

## 2014-10-23 DIAGNOSIS — Z113 Encounter for screening for infections with a predominantly sexual mode of transmission: Secondary | ICD-10-CM

## 2014-10-23 DIAGNOSIS — Z79899 Other long term (current) drug therapy: Secondary | ICD-10-CM

## 2014-10-23 DIAGNOSIS — B20 Human immunodeficiency virus [HIV] disease: Secondary | ICD-10-CM

## 2014-10-23 DIAGNOSIS — J111 Influenza due to unidentified influenza virus with other respiratory manifestations: Secondary | ICD-10-CM

## 2014-10-23 LAB — COMPLETE METABOLIC PANEL WITH GFR
ALBUMIN: 4.6 g/dL (ref 3.5–5.2)
ALT: 14 U/L (ref 0–53)
AST: 23 U/L (ref 0–37)
Alkaline Phosphatase: 71 U/L (ref 39–117)
BUN: 17 mg/dL (ref 6–23)
CO2: 25 mEq/L (ref 19–32)
Calcium: 9.5 mg/dL (ref 8.4–10.5)
Chloride: 100 mEq/L (ref 96–112)
Creat: 1.01 mg/dL (ref 0.50–1.35)
GFR, Est African American: >89 mL/min
GFR, Est Non African American: >89 mL/min
Glucose, Bld: 82 mg/dL (ref 70–99)
POTASSIUM: 4.3 meq/L (ref 3.5–5.3)
Sodium: 138 mEq/L (ref 135–145)
Total Bilirubin: 0.7 mg/dL (ref 0.2–1.2)
Total Protein: 7.8 g/dL (ref 6.0–8.3)

## 2014-10-23 LAB — CBC WITH DIFFERENTIAL/PLATELET
Basophils Absolute: 0 10*3/uL (ref 0.0–0.1)
Basophils Relative: 0 % (ref 0–1)
Eosinophils Absolute: 0.1 10*3/uL (ref 0.0–0.7)
Eosinophils Relative: 1 % (ref 0–5)
HCT: 42.9 % (ref 39.0–52.0)
Hemoglobin: 14.4 g/dL (ref 13.0–17.0)
LYMPHS ABS: 2.4 10*3/uL (ref 0.7–4.0)
LYMPHS PCT: 29 % (ref 12–46)
MCH: 29.1 pg (ref 26.0–34.0)
MCHC: 33.6 g/dL (ref 30.0–36.0)
MCV: 86.7 fL (ref 78.0–100.0)
MPV: 8.8 fL (ref 8.6–12.4)
Monocytes Absolute: 1 10*3/uL (ref 0.1–1.0)
Monocytes Relative: 12 % (ref 3–12)
Neutro Abs: 4.8 10*3/uL (ref 1.7–7.7)
Neutrophils Relative %: 58 % (ref 43–77)
PLATELETS: 223 10*3/uL (ref 150–400)
RBC: 4.95 MIL/uL (ref 4.22–5.81)
RDW: 15.6 % — AB (ref 11.5–15.5)
WBC: 8.3 10*3/uL (ref 4.0–10.5)

## 2014-10-23 LAB — LIPID PANEL
Cholesterol: 148 mg/dL (ref 0–200)
HDL: 40 mg/dL (ref >=40)
LDL Cholesterol: 91 mg/dL (ref 0–99)
TRIGLYCERIDES: 85 mg/dL (ref <150)
Total CHOL/HDL Ratio: 3.7 Ratio
VLDL: 17 mg/dL (ref 0–40)

## 2014-10-23 MED ORDER — OSELTAMIVIR PHOSPHATE 75 MG PO CAPS: 75.0000 mg | ORAL_CAPSULE | Freq: Two times a day (BID) | ORAL | Status: DC

## 2014-10-23 NOTE — Assessment & Plan Note (Signed)
I will do his labs today and he can return in 3 months everything is reassuring.

## 2014-10-23 NOTE — Assessment & Plan Note (Signed)
I will try Tamiflu for a few days though he is likely getting over it.

## 2014-10-23 NOTE — Progress Notes (Signed)
   Subjective:    Patient ID: Cory Russell, male    DOB: 11-23-1990, 24 y.o.   MRN: [id number]  Sore Throat  This is a new problem. The current episode started in the past 7 days. The problem has been gradually improving. The maximum temperature recorded prior to his arrival was 101 - 101.9 F. The fever has been present for 3 to 4 days. Associated symptoms include diarrhea and trouble swallowing. Pertinent negatives include no shortness of breath. He has tried nothing for the symptoms.  Fever  Associated symptoms include diarrhea. Pertinent negatives include no nausea or rash.   He is here for a follow-up of HIV.   He was diagnosed in 2012 and has been previously on Atripla, but developed resistance, then a 4 and 3 drug regimen (presumed PI), then started on Stribild about September of 2013.  Had been on that with good suppression but stopped it after not following up.   He again reported last visit sporadic compliance until December when he started taking it daily. 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  I then changed him to Prezista, norvir, [employer].  He continues to be on this and last visit was undetectable. No labs prior to this visit.  He also has myalgias, sore throat and fever up to 101.   Review of Systems  Constitutional: Positive for fever. Negative for chills, fatigue and unexpected weight change.  HENT: Positive for trouble swallowing.   Respiratory: Negative for shortness of breath.   Gastrointestinal: Positive for diarrhea. Negative for nausea.  Musculoskeletal: Negative for arthralgias.  Skin: Negative for rash.  Neurological: Negative for dizziness and light-headedness.  Hematological: Negative for adenopathy.  Psychiatric/Behavioral: Negative for dysphoric mood.       Objective:   Physical Exam  Constitutional: He appears well-developed and well-nourished. No  distress.  HENT:  He has enlarged bilateral tonsils with erythema. No exudates.  Eyes: Left eye exhibits no discharge. No scleral icterus.  Cardiovascular: Normal rate, regular rhythm and normal heart sounds.   No murmur heard. Pulmonary/Chest: Effort normal and breath sounds normal. No respiratory distress. He has no wheezes.  Lymphadenopathy:    He has no cervical adenopathy.  Skin: No rash noted.          Assessment & Plan:

## 2014-10-24 LAB — T-HELPER CELL (CD4) - (RCID CLINIC ONLY)
CD4 % Helper T Cell: 19 % — ABNORMAL LOW (ref 33–55)
CD4 T Cell Abs: 460 /uL (ref 400–2700)

## 2014-10-24 LAB — URINE CYTOLOGY ANCILLARY ONLY
CHLAMYDIA, DNA PROBE: NEGATIVE
Neisseria Gonorrhea: NEGATIVE

## 2014-10-24 LAB — RPR

## 2014-10-26 LAB — HIV-1 RNA QUANT-NO REFLEX-BLD: HIV 1 RNA Quant: <20 copies/mL (ref <20)

## 2015-01-12 ENCOUNTER — Other Ambulatory Visit: Payer: BC Managed Care – PPO

## 2015-01-12 DIAGNOSIS — Z113 Encounter for screening for infections with a predominantly sexual mode of transmission: Secondary | ICD-10-CM

## 2015-01-12 DIAGNOSIS — Z79899 Other long term (current) drug therapy: Secondary | ICD-10-CM

## 2015-01-12 DIAGNOSIS — B20 Human immunodeficiency virus [HIV] disease: Secondary | ICD-10-CM

## 2015-01-12 LAB — CBC WITH DIFFERENTIAL/PLATELET
BASOS PCT: 0 % (ref 0–1)
Basophils Absolute: 0 10*3/uL (ref 0.0–0.1)
Eosinophils Absolute: 0.1 10*3/uL (ref 0.0–0.7)
Eosinophils Relative: 1 % (ref 0–5)
HCT: 36.8 % — ABNORMAL LOW (ref 39.0–52.0)
Hemoglobin: 12.6 g/dL — ABNORMAL LOW (ref 13.0–17.0)
LYMPHS PCT: 55 % — AB (ref 12–46)
Lymphs Abs: 2.9 10*3/uL (ref 0.7–4.0)
MCH: 30.2 pg (ref 26.0–34.0)
MCHC: 34.2 g/dL (ref 30.0–36.0)
MCV: 88.2 fL (ref 78.0–100.0)
MONO ABS: 0.6 10*3/uL (ref 0.1–1.0)
MPV: 8.2 fL — ABNORMAL LOW (ref 8.6–12.4)
Monocytes Relative: 11 % (ref 3–12)
NEUTROS ABS: 1.7 10*3/uL (ref 1.7–7.7)
NEUTROS PCT: 33 % — AB (ref 43–77)
Platelets: 220 10*3/uL (ref 150–400)
RBC: 4.17 MIL/uL — ABNORMAL LOW (ref 4.22–5.81)
RDW: 15 % (ref 11.5–15.5)
WBC: 5.2 10*3/uL (ref 4.0–10.5)

## 2015-01-12 LAB — LIPID PANEL
CHOLESTEROL: 148 mg/dL (ref 125–200)
HDL: 36 mg/dL — ABNORMAL LOW (ref >=40)
LDL CALC: 95 mg/dL (ref <130)
Total CHOL/HDL Ratio: 4.1 Ratio (ref <=5.0)
Triglycerides: 84 mg/dL (ref <150)
VLDL: 17 mg/dL (ref <30)

## 2015-01-12 LAB — COMPLETE METABOLIC PANEL WITH GFR
ALBUMIN: 4.2 g/dL (ref 3.6–5.1)
ALK PHOS: 41 U/L (ref 40–115)
ALT: 9 U/L (ref 9–46)
AST: 17 U/L (ref 10–40)
BUN: 11 mg/dL (ref 7–25)
CHLORIDE: 105 mmol/L (ref 98–110)
CO2: 26 mmol/L (ref 20–31)
CREATININE: 1 mg/dL (ref 0.60–1.35)
Calcium: 9.5 mg/dL (ref 8.6–10.3)
GFR, Est African American: >89 mL/min (ref >=60)
GLUCOSE: 85 mg/dL (ref 65–99)
Potassium: 4 mmol/L (ref 3.5–5.3)
Sodium: 139 mmol/L (ref 135–146)
Total Bilirubin: 0.5 mg/dL (ref 0.2–1.2)
Total Protein: 6.9 g/dL (ref 6.1–8.1)

## 2015-01-12 LAB — RPR

## 2015-01-12 NOTE — Addendum Note (Signed)
Addended by: Mariea Clonts D on: 01/12/2015 03:33 PM   Modules accepted: Orders

## 2015-01-13 LAB — T-HELPER CELL (CD4) - (RCID CLINIC ONLY)
CD4 % Helper T Cell: 24 % — ABNORMAL LOW (ref 33–55)
CD4 T Cell Abs: 710 /uL (ref 400–2700)

## 2015-01-13 LAB — URINE CYTOLOGY ANCILLARY ONLY
Chlamydia: NEGATIVE
NEISSERIA GONORRHEA: NEGATIVE

## 2015-01-14 LAB — HIV-1 RNA QUANT-NO REFLEX-BLD
HIV 1 RNA QUANT: 116 {copies}/mL — AB (ref <20)
HIV-1 RNA Quant, Log: 2.06 {Log} — ABNORMAL HIGH (ref <1.30)

## 2015-01-26 ENCOUNTER — Ambulatory Visit (INDEPENDENT_AMBULATORY_CARE_PROVIDER_SITE_OTHER): Payer: BC Managed Care – PPO | Admitting: Internal Medicine

## 2015-01-26 ENCOUNTER — Encounter: Payer: Self-pay | Admitting: Internal Medicine

## 2015-01-26 VITALS — BP 128/82 | HR 88 | Temp 98.1°F | Ht 67.0 in | Wt 125.0 lb

## 2015-01-26 DIAGNOSIS — F329 Major depressive disorder, single episode, unspecified: Secondary | ICD-10-CM | POA: Diagnosis not present

## 2015-01-26 DIAGNOSIS — R630 Anorexia: Secondary | ICD-10-CM | POA: Diagnosis not present

## 2015-01-26 DIAGNOSIS — B20 Human immunodeficiency virus [HIV] disease: Secondary | ICD-10-CM

## 2015-01-26 DIAGNOSIS — F32A Depression, unspecified: Secondary | ICD-10-CM | POA: Insufficient documentation

## 2015-01-26 NOTE — Progress Notes (Unsigned)
Patient ID: Cory Russell, male   DOB: May 28, 1991, 24 y.o.   MRN: [id number] Met with patient for "warm handoff" and gave him an appointment next week. [name], LCSW

## 2015-01-26 NOTE — Assessment & Plan Note (Signed)
Some improvement with megace and will use intermittently.

## 2015-01-26 NOTE — Assessment & Plan Note (Signed)
Doing well, some detectable virus though good compliance.  Will keep at q 3 months.

## 2015-01-26 NOTE — Progress Notes (Signed)
   Subjective:    Patient ID: Cory Russell, male    DOB: [birth year], 24 y.o.   MRN: [id number]  HPI He is here for a follow-up of HIV.   He was diagnosed in 2012 and has been previously on Atripla, but developed resistance, then a 4 and 3 drug regimen (presumed PI), then started on Stribild about September of 2013.  Had been on that with good suppression but stopped it after not following up.   He again reported last visit sporadic compliance until December when he started taking it daily. 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  I then changed him to Prezista, norvir, [employer].  He now has been on it for several months and viral load 116 and CD4 up to 710.  No complaints. No missed doses.  Had to get used to pill size but ok now, does not want to try to change.     Review of Systems  Constitutional: Negative for fever, chills, fatigue and unexpected weight change.  HENT: Negative for trouble swallowing.   Respiratory: Negative for shortness of breath.   Gastrointestinal: Negative for nausea and diarrhea.  Musculoskeletal: Negative for arthralgias.  Skin: Negative for rash.  Neurological: Negative for dizziness and light-headedness.  Hematological: Negative for adenopathy.  Psychiatric/Behavioral: Negative for dysphoric mood.       Objective:   Physical Exam  Constitutional: He appears well-developed and well-nourished. No distress.  HENT:  Mouth/Throat: No oropharyngeal exudate.  Eyes: Left eye exhibits no discharge. No scleral icterus.  Cardiovascular: Normal rate, regular rhythm and normal heart sounds.   No murmur heard. Pulmonary/Chest: Effort normal and breath sounds normal. No respiratory distress. He has no wheezes.  Lymphadenopathy:    He has no cervical adenopathy.  Skin: No rash noted.          Assessment & Plan:

## 2015-01-26 NOTE — Assessment & Plan Note (Signed)
Seen by our counselor today and will continue with him.

## 2015-01-27 ENCOUNTER — Ambulatory Visit: Payer: BC Managed Care – PPO

## 2015-01-27 DIAGNOSIS — F4323 Adjustment disorder with mixed anxiety and depressed mood: Secondary | ICD-10-CM

## 2015-01-27 NOTE — BH Specialist Note (Signed)
I met with Court today for the first time and he reports a history of depression that will last for 2-3 weeks at a time as well as anxiety.  He said his anxiety is usually tied to doing something new or going somewhere new and involves sweaty hands, heart racing, butterflies in his stomach, etc.  He said he has sweaty hands frequently now.  He talked about his parents, who live in [place], [place], and how he used to go there for a week at a time to relax, but then had to return to the real world and made him feel worse.  His mood is currently good, he said, rating it at 6-7 on a 10 pt scale, but when he is depressed it drops to 4-5.  He is feeling pressure from his parents because his successful brother has started to "go downhill", drinking and becoming bizarre and paranoid at times.  He said his father actually said to him "it's on you now to make good, since your brother is doing so poorly".  I provided psycho-education on breathing techniques to reduce anxiety and on cognitive behavior therapy, explaining how thoughts affect feelings and giving him Quay Burow' list of cognitive distortions.  Plan to meet in one week. [name], LCSW

## 2015-02-03 ENCOUNTER — Ambulatory Visit: Payer: BC Managed Care – PPO

## 2015-02-03 DIAGNOSIS — F32A Depression, unspecified: Secondary | ICD-10-CM

## 2015-02-03 DIAGNOSIS — F329 Major depressive disorder, single episode, unspecified: Secondary | ICD-10-CM
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
## 2015-02-12 ENCOUNTER — Ambulatory Visit: Payer: BC Managed Care – PPO

## 2015-03-02 ENCOUNTER — Telehealth: Payer: Self-pay | Admitting: *Deleted

## 2015-03-02 NOTE — Telephone Encounter (Signed)
Patient walked into clinic with a card from the [facility] Department stating he was listed as a contact for an STD. His partner had called the clinic earlier and also told him to come in. Advised patient to go to the health department for testing and possible treatment.

## 2015-03-21 ENCOUNTER — Encounter (HOSPITAL_COMMUNITY): Payer: Self-pay | Admitting: Emergency Medicine

## 2015-03-21 ENCOUNTER — Emergency Department (HOSPITAL_COMMUNITY)
Admission: EM | Admit: 2015-03-21 | Discharge: 2015-03-21 | Disposition: A | Discharge status: Home / Self Care | Payer: BC Managed Care – PPO | Attending: Emergency Medicine | Admitting: Emergency Medicine

## 2015-03-21 DIAGNOSIS — I10 Essential (primary) hypertension: Secondary | ICD-10-CM | POA: Diagnosis not present

## 2015-03-21 DIAGNOSIS — E119 Type 2 diabetes mellitus without complications: Secondary | ICD-10-CM | POA: Insufficient documentation

## 2015-03-21 DIAGNOSIS — Z79899 Other long term (current) drug therapy: Secondary | ICD-10-CM | POA: Diagnosis not present

## 2015-03-21 DIAGNOSIS — B20 Human immunodeficiency virus [HIV] disease: Secondary | ICD-10-CM | POA: Insufficient documentation

## 2015-03-21 DIAGNOSIS — Y92410 Unspecified street and highway as the place of occurrence of the external cause: Secondary | ICD-10-CM | POA: Insufficient documentation

## 2015-03-21 DIAGNOSIS — S3992XA Unspecified injury of lower back, initial encounter: Secondary | ICD-10-CM | POA: Diagnosis not present

## 2015-03-21 DIAGNOSIS — S199XXA Unspecified injury of neck, initial encounter: Secondary | ICD-10-CM | POA: Diagnosis present

## 2015-03-21 DIAGNOSIS — S161XXA Strain of muscle, fascia and tendon at neck level, initial encounter: Secondary | ICD-10-CM | POA: Diagnosis not present

## 2015-03-21 DIAGNOSIS — Y9389 Activity, other specified: Secondary | ICD-10-CM | POA: Diagnosis not present

## 2015-03-21 DIAGNOSIS — Y998 Other external cause status: Secondary | ICD-10-CM | POA: Insufficient documentation

## 2015-03-21 MED ORDER — METHOCARBAMOL 500 MG PO TABS: 500.0000 mg | ORAL_TABLET | Freq: Three times a day (TID) | ORAL | Status: DC | PRN

## 2015-03-21 MED ORDER — NAPROXEN 500 MG PO TABS: 500.0000 mg | ORAL_TABLET | Freq: Two times a day (BID) | ORAL | Status: DC

## 2015-03-21 MED ORDER — METHOCARBAMOL 500 MG PO TABS
1000.0000 mg | ORAL_TABLET | Freq: Once | ORAL | Status: AC
  Administered 2015-03-21: 1000 mg via ORAL
  Filled 2015-03-21: qty 2

## 2015-03-21 MED ORDER — IBUPROFEN 800 MG PO TABS
800.0000 mg | ORAL_TABLET | Freq: Once | ORAL | Status: AC
  Administered 2015-03-21: 800 mg via ORAL
  Filled 2015-03-21: qty 1

## 2015-03-21 NOTE — Discharge Instructions (Signed)
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
## 2015-04-27 ENCOUNTER — Other Ambulatory Visit: Payer: BC Managed Care – PPO

## 2015-04-28 ENCOUNTER — Other Ambulatory Visit: Payer: BC Managed Care – PPO

## 2015-04-29 ENCOUNTER — Other Ambulatory Visit (HOSPITAL_COMMUNITY)
Admission: RE | Admit: 2015-04-29 | Discharge: 2015-04-29 | Disposition: A | Discharge status: Home / Self Care | Payer: BC Managed Care – PPO | Source: Ambulatory Visit | Attending: Family Medicine | Admitting: Family Medicine

## 2015-04-29 ENCOUNTER — Encounter (HOSPITAL_COMMUNITY): Payer: Self-pay | Admitting: Emergency Medicine

## 2015-04-29 ENCOUNTER — Emergency Department (INDEPENDENT_AMBULATORY_CARE_PROVIDER_SITE_OTHER)
Admission: EM | Admit: 2015-04-29 | Discharge: 2015-04-29 | Disposition: A | Discharge status: Home / Self Care | Payer: BC Managed Care – PPO | Source: Home / Self Care

## 2015-04-29 DIAGNOSIS — Z202 Contact with and (suspected) exposure to infections with a predominantly sexual mode of transmission: Secondary | ICD-10-CM

## 2015-04-29 DIAGNOSIS — Z113 Encounter for screening for infections with a predominantly sexual mode of transmission: Secondary | ICD-10-CM | POA: Diagnosis not present

## 2015-04-29 LAB — POCT URINALYSIS DIP (DEVICE)
BILIRUBIN URINE: NEGATIVE
Glucose, UA: NEGATIVE mg/dL
HGB URINE DIPSTICK: NEGATIVE
Ketones, ur: NEGATIVE mg/dL
LEUKOCYTES UA: NEGATIVE
NITRITE: NEGATIVE
Protein, ur: NEGATIVE mg/dL
SPECIFIC GRAVITY, URINE: 1.025 (ref 1.005–1.030)
Urobilinogen, UA: 1 mg/dL (ref 0.0–1.0)
pH: 7 (ref 5.0–8.0)

## 2015-04-29 MED ORDER — AZITHROMYCIN 250 MG PO TABS
ORAL_TABLET | ORAL | Status: AC
  Filled 2015-04-29: qty 4

## 2015-04-29 MED ORDER — PENICILLIN G BENZATHINE 1200000 UNIT/2ML IM SUSP
INTRAMUSCULAR | Status: AC
  Filled 2015-04-29: qty 2

## 2015-04-29 MED ORDER — AZITHROMYCIN 250 MG PO TABS
1000.0000 mg | ORAL_TABLET | Freq: Once | ORAL | Status: AC
  Administered 2015-04-29: 1000 mg via ORAL

## 2015-04-29 MED ORDER — PENICILLIN G BENZATHINE & PROC 900000-300000 UNIT/2ML IM SUSP
2.4000 10*6.[IU] | Freq: Once | INTRAMUSCULAR | Status: AC
  Administered 2015-04-29: 2.4 10*6.[IU] via INTRAMUSCULAR

## 2015-04-29 NOTE — Discharge Instructions (Signed)
Syphilis Syphilis is an infectious disease. It can cause serious complications if left untreated.  CAUSES  Syphilis is caused by a type of bacteria called Treponema pallidum. It is most commonly spread through sexual contact. Syphilis may also spread to a fetus through the blood of the mother.  SIGNS AND SYMPTOMS Symptoms vary depending on the stage of the disease. Some symptoms may disappear without treatment. However, this does not mean that the infection is gone. One form of syphilis (called latent syphilis) has no symptoms.  Primary Syphilis  Painless sores (chancres) in and around the genital organs and mouth.  Swollen lymph nodes near the sores. Secondary Syphilis  A rash or sores over any portion of the body, including the palms of the hands and soles of the feet.  Fever.  Headache.  Sore throat.  Swollen lymph nodes.  New sores in the mouth or on the genitals.  Feeling generally ill.  Having pain in the joints. Tertiary Syphilis The third stage of syphilis involves severe damage to different organs in the body, such as the brain, spinal cord, and heart. Signs and symptoms may include:   Dementia.  Personality and mood changes.  Difficulty walking.  Heart failure.  Fainting.  Enlargement (aneurysm) of the aorta.  Tumors of the skin, bones, or liver.  Muscle weakness.  Sudden "lightning" pains, numbness, or tingling.  Problems with coordination.  Vision changes. DIAGNOSIS   A physical exam will be done.  Blood tests will be done to confirm the diagnosis.  If the disease is in the first or second stages, a fluid (drainage) sample from a sore or rash may be examined under a microscope to detect the disease-causing bacteria.  Fluid around the spine may need to be examined to detect brain damage or inflammation of the brain lining (meningitis).  If the disease is in the third stage, X-rays, CT scans, MRIs, echocardiograms, ultrasounds, or cardiac  catheterization may also be done to detect disease of the heart, aorta, or brain. TREATMENT  Syphilis can be cured with antibiotic medicine if a diagnosis is made early. During the first day of treatment, you may experience fever, chills, headache, nausea, or aching all over your body. This is a normal reaction to the antibiotics.  HOME CARE INSTRUCTIONS   Take your antibiotic medicine as directed by your health care provider. Finish the antibiotic even if you start to feel better. Incomplete treatment will put you at risk for continued infection and could be life threatening.  Take medicines only as directed by your health care provider.  Do not have sexual intercourse until your treatment is completed or as directed by your health care provider.  Inform your recent sexual partners that you were diagnosed with syphilis. They need to seek care and treatment, even if they have no symptoms. It is necessary that all your sexual partners be tested for infection and treated if they have the disease.  Keep all follow-up visits as directed by your health care provider. It is important to keep all your appointments.  If your test results are not ready during your visit, make an appointment with your health care provider to find out the results. Do not assume everything is normal if you have not heard from your health care provider or the medical facility. It is your responsibility to get your test results. SEEK MEDICAL CARE IF:  You continue to have any of the following 24 hours after beginning treatment:  Fever.  Chills.  Headache.  Nausea.  Aching all over your body.  You have symptoms of an allergic reaction to medicine, such as:  Chills.  A headache.  Light-headedness.  A new rash (especially hives).  Difficulty breathing. MAKE SURE YOU:   Understand these instructions.  Will watch your condition.  Will get help right away if you are not doing well or get worse.   This  information is not intended to replace advice given to you by your health care provider. Make sure you discuss any questions you have with your health care provider.   Document Released: 03/13/2013 Document Revised: 06/13/2014 Document Reviewed: 03/13/2013 Elsevier Interactive Patient Education 2016 [employer] Sexually Transmitted Disease A sexually transmitted disease (STD) is a disease or infection often passed to another person during sex. However, STDs can be passed through nonsexual ways. An STD can be passed through:  Spit (saliva).  Semen.  Blood.  Mucus from the vagina.  Pee (urine). HOW CAN I LESSEN MY CHANCES OF GETTING AN STD?  Use:  Latex condoms.  Water-soluble lubricants with condoms. Do not use petroleum jelly or oils.  Dental dams. These are small pieces of latex that are used as a barrier during oral sex.  Avoid having more than one sex partner.  Do not have sex with someone who has other sex partners.  Do not have sex with anyone you do not know or who is at high risk for an STD.  Avoid risky sex that can break your skin.  Do not have sex if you have open sores on your mouth or skin.  Avoid drinking too much alcohol or taking illegal drugs. Alcohol and drugs can affect your good judgment.  Avoid oral and anal sex acts.  Get shots (vaccines) for HPV and hepatitis.  If you are at risk of being infected with HIV, it is advised that you take a certain medicine daily to prevent HIV infection. This is called pre-exposure prophylaxis (PrEP). You may be at risk if:  You are a man who has sex with other men (MSM).  You are attracted to the opposite sex (heterosexual) and are having sex with more than one partner.  You take drugs with a needle.  You have sex with someone who has HIV.  Talk with your doctor about if you are at high risk of being infected with HIV. If you begin to take PrEP, get tested for HIV first. Get tested every 3 months for as long  as you are taking PrEP.  Get tested for STDs every year if you are sexually active. If you are treated for an STD, get tested again 3 months after you are treated. WHAT SHOULD I DO IF I THINK I HAVE AN STD?  See your doctor.  Tell your sex partner(s) that you have an STD. They should be tested and treated.  Do not have sex until your doctor says it is okay. WHEN SHOULD I GET HELP? Get help right away if:  You have bad belly (abdominal) pain.  You are a man and have puffiness (swelling) or pain in your testicles.  You are a woman and have puffiness in your vagina.   This information is not intended to replace advice given to you by your health care provider. Make sure you discuss any questions you have with your health care provider.   Document Released: 06/30/2004 Document Revised: 06/13/2014 Document Reviewed: 11/16/2012 Elsevier Interactive Patient Education [employer] Elsevier Inc.

## 2015-04-29 NOTE — ED Notes (Signed)
[facility] a [facility]. The patient stated that he has no symptoms however his partner does.

## 2015-04-30 LAB — RPR: RPR Ser Ql: NONREACTIVE

## 2015-05-01 LAB — CYTOLOGY, (ORAL, ANAL, URETHRAL) ANCILLARY ONLY
CHLAMYDIA, DNA PROBE: NEGATIVE
Neisseria Gonorrhea: NEGATIVE
TRICH (WINDOWPATH): NEGATIVE

## 2015-05-01 NOTE — ED Notes (Signed)
Final report of STD testing negative for pathogens 

## 2015-05-07 NOTE — ED Provider Notes (Signed)
CSN: [id number]     Arrival date & time 04/29/15  1343 History   None    Chief Complaint  Patient presents with  . SEXUALLY TRANSMITTED DISEASE   (Consider location/radiation/quality/duration/timing/severity/associated sxs/prior Treatment) HPI Pt with known HIV, previous syphilis infection presents for evaluation. Partner with active syphilis infection, pt has no symptoms.   Past Medical History  Diagnosis Date  . HIV (human immunodeficiency virus infection) (HCC)   . Hypertension   . Diabetes mellitus without complication (HCC)    History reviewed. No pertinent past surgical history. Family History  Problem Relation Age of Onset  . Diabetes Mother   . Hypertension Father    Social History  Substance Use Topics  . Smoking status: Never Smoker   . Smokeless tobacco: Never Used  . Alcohol Use: 0.0 oz/week    0 Standard drinks or equivalent per week     Comment: occasional    Review of Systems ROS +'ve STD exposure  Denies: HEADACHE, NAUSEA, ABDOMINAL PAIN, CHEST PAIN, CONGESTION, DYSURIA, SHORTNESS OF BREATH  Allergies  Review of patient's allergies indicates no known allergies.  Home Medications   Prior to Admission medications   Medication Sig Start Date End Date Taking? Authorizing Provider  Abacavir-Dolutegravir-Lamivud 600-50-300 MG TABS Take 1 tablet by mouth daily. Patient not taking: Reported on 03/21/2015 09/19/14   Gardiner Barefoot, MD  Darunavir Ethanolate (PREZISTA) 800 MG tablet Take 1 tablet (800 mg total) by mouth daily. 09/19/14   Gardiner Barefoot, MD  megestrol (MEGACE ES) 625 MG/5ML suspension Take 5 mLs (625 mg total) by mouth daily. 09/19/14   Gardiner Barefoot, MD  methocarbamol (ROBAXIN) 500 MG tablet Take 1 tablet (500 mg total) by mouth 3 (three) times daily between meals as needed. 03/21/15   Rolland Porter, MD  naproxen (NAPROSYN) 500 MG tablet Take 1 tablet (500 mg total) by mouth 2 (two) times daily. 03/21/15   Rolland Porter, MD  ritonavir (NORVIR) 100 MG  TABS tablet Take 1 tablet (100 mg total) by mouth daily. 09/19/14   Gardiner Barefoot, MD   Meds Ordered and Administered this Visit   Medications  penicillin g benzathine-penicillin g procaine (BICILLIN-CR) injection 900000-300000 units (2.4 Million Units Intramuscular Given 04/29/15 1459)  azithromycin (ZITHROMAX) tablet 1,000 mg (1,000 mg Oral Given 04/29/15 1459)    BP 145/88 mmHg  Pulse 71  Temp(Src) 98.1 F (36.7 C) (Oral)  Resp 18  SpO2 98% No data found.   Physical Exam  Constitutional: He is oriented to person, place, and time. He appears well-developed and well-nourished.  HENT:  Head: Atraumatic.  Pulmonary/Chest: Effort normal.  Genitourinary: Penis normal. No penile tenderness.  Musculoskeletal: Normal range of motion.  Neurological: He is alert and oriented to person, place, and time.  Skin: Skin is warm and dry.  Psychiatric: He has a normal mood and affect. His behavior is normal. Judgment normal.  Nursing note and vitals reviewed.   ED Course  Procedures (including critical care time)  Labs Review Labs Reviewed  RPR  POCT URINALYSIS DIP (DEVICE)  CYTOLOGY, (ORAL, ANAL, URETHRAL) ANCILLARY ONLY  URINE CYTOLOGY ANCILLARY ONLY    Imaging Review No results found.   Visual Acuity Review  Right Eye Distance:   Left Eye Distance:   Bilateral Distance:    Right Eye Near:   Left Eye Near:    Bilateral Near:         MDM   1. Exposure to sexually transmitted disease (STD)    Culture  pending, Treatment with bicillin LA/ azithromax Condom use encouraged    Tharon AquasFrank C Natori Gudino, [place] 05/07/15 [phone]

## 2015-05-18 ENCOUNTER — Ambulatory Visit: Payer: BC Managed Care – PPO | Admitting: Infectious Disease

## 2015-07-06 ENCOUNTER — Encounter (HOSPITAL_COMMUNITY): Payer: Self-pay | Admitting: Emergency Medicine

## 2015-07-06 ENCOUNTER — Other Ambulatory Visit (HOSPITAL_COMMUNITY)
Admission: RE | Admit: 2015-07-06 | Discharge: 2015-07-06 | Disposition: A | Discharge status: Home / Self Care | Payer: BC Managed Care – PPO | Source: Ambulatory Visit | Attending: Family Medicine | Admitting: Family Medicine

## 2015-07-06 ENCOUNTER — Emergency Department (INDEPENDENT_AMBULATORY_CARE_PROVIDER_SITE_OTHER)
Admission: EM | Admit: 2015-07-06 | Discharge: 2015-07-06 | Disposition: A | Discharge status: Home / Self Care | Payer: BC Managed Care – PPO | Source: Home / Self Care | Attending: Family Medicine | Admitting: Family Medicine

## 2015-07-06 DIAGNOSIS — R6889 Other general symptoms and signs: Secondary | ICD-10-CM

## 2015-07-06 DIAGNOSIS — H6691 Otitis media, unspecified, right ear: Secondary | ICD-10-CM | POA: Insufficient documentation

## 2015-07-06 LAB — POCT RAPID STREP A: STREPTOCOCCUS, GROUP A SCREEN (DIRECT): NEGATIVE

## 2015-07-06 MED ORDER — AMOXICILLIN 500 MG PO CAPS: 500.0000 mg | ORAL_CAPSULE | Freq: Three times a day (TID) | ORAL | Status: DC

## 2015-07-06 NOTE — ED Notes (Signed)
The patient presented to the [facility] with a complaint of a sore throat, off and on fever, general body aches and a headache x 4 days.

## 2015-07-06 NOTE — Discharge Instructions (Signed)
Otitis Media, Adult Otitis media is redness, soreness, and puffiness (swelling) in the space just behind your eardrum (middle ear). It may be caused by allergies or infection. It often happens along with a cold. HOME CARE  Take your medicine as told. Finish it even if you start to feel better.  Only take over-the-counter or prescription medicines for pain, discomfort, or fever as told by your doctor.  Follow up with your doctor as told. GET HELP IF:  You have otitis media only in one ear, or bleeding from your nose, or both.  You notice a lump on your neck.  You are not getting better in 3-5 days.  You feel worse instead of better. GET HELP RIGHT AWAY IF:   You have pain that is not helped with medicine.  You have puffiness, redness, or pain around your ear.  You get a stiff neck.  You cannot move part of your face (paralysis).  You notice that the bone behind your ear hurts when you touch it. MAKE SURE YOU:   Understand these instructions.  Will watch your condition.  Will get help right away if you are not doing well or get worse.   This information is not intended to replace advice given to you by your health care provider. Make sure you discuss any questions you have with your health care provider.   Document Released: 11/09/2007 Document Revised: 06/13/2014 Document Reviewed: 12/18/2012 Elsevier Interactive Patient Education 2016 Elsevier Inc. Influenza, Adult Influenza ("the flu") is a viral infection of the respiratory tract. It occurs more often in winter months because people spend more time in close contact with one another. Influenza can make you feel very sick. Influenza easily spreads from person to person (contagious). CAUSES  Influenza is caused by a virus that infects the respiratory tract. You can catch the virus by breathing in droplets from an infected person's cough or sneeze. You can also catch the virus by touching something that was recently  contaminated with the virus and then touching your mouth, nose, or eyes. RISKS AND COMPLICATIONS You may be at risk for a more severe case of influenza if you smoke cigarettes, have diabetes, have chronic heart disease (such as heart failure) or lung disease (such as asthma), or if you have a weakened immune system. Elderly people and pregnant women are also at risk for more serious infections. The most common problem of influenza is a lung infection (pneumonia). Sometimes, this problem can require emergency medical care and may be life threatening. SIGNS AND SYMPTOMS  Symptoms typically last 4 to 10 days and may include:  Fever.  Chills.  Headache, body aches, and muscle aches.  Sore throat.  Chest discomfort and cough.  Poor appetite.  Weakness or feeling tired.  Dizziness.  Nausea or vomiting. DIAGNOSIS  Diagnosis of influenza is often made based on your history and a physical exam. A nose or throat swab test can be done to confirm the diagnosis. TREATMENT  In mild cases, influenza goes away on its own. Treatment is directed at relieving symptoms. For more severe cases, your health care provider may prescribe antiviral medicines to shorten the sickness. Antibiotic medicines are not effective because the infection is caused by a virus, not by bacteria. HOME CARE INSTRUCTIONS  Take medicines only as directed by your health care provider.  Use a cool mist humidifier to make breathing easier.  Get plenty of rest until your temperature returns to normal. This usually takes 3 to 4 days.  Drink enough fluid to keep your urine clear or pale yellow.  Cover yourmouth and nosewhen coughing or sneezing,and wash your handswellto prevent thevirusfrom spreading.  Stay homefromwork orschool untilthe fever is gonefor at least 26full day. PREVENTION  An annual influenza vaccination (flu shot) is the best way to avoid getting influenza. An annual flu shot is now routinely  recommended for all adults in the [place] SEEK MEDICAL CARE IF:  You experiencechest pain, yourcough worsens,or you producemore mucus.  Youhave nausea,vomiting, ordiarrhea.  Your fever returns or gets worse. SEEK IMMEDIATE MEDICAL CARE IF:  You havetrouble breathing, you become short of breath,or your skin ornails becomebluish.  You have severe painor stiffnessin the neck.  You develop a sudden headache, or pain in the face or ear.  You have nausea or vomiting that you cannot control. MAKE SURE YOU:   Understand these instructions.  Will watch your condition.  Will get help right away if you are not doing well or get worse.   This information is not intended to replace advice given to you by your health care provider. Make sure you discuss any questions you have with your health care provider.   Document Released: 05/20/2000 Document Revised: 06/13/2014 Document Reviewed: 08/22/2011 Elsevier Interactive Patient Education [employer]

## 2015-07-06 NOTE — ED Provider Notes (Signed)
CSN: [id number]     Arrival date & time 07/06/15  1846 History   First MD Initiated Contact with Patient 07/06/15 1955     Chief Complaint  Patient presents with  . Fever  . Sore Throat  . Generalized Body Aches   (Consider location/radiation/quality/duration/timing/severity/associated sxs/prior Treatment) HPI Sore throat, not feeling well. Fever last week., body aches. Past Medical History  Diagnosis Date  . HIV (human immunodeficiency virus infection) (HCC)   . Hypertension   . Diabetes mellitus without complication (HCC)    No past surgical history on file. Family History  Problem Relation Age of Onset  . Diabetes Mother   . Hypertension Father    Social History  Substance Use Topics  . Smoking status: Never Smoker   . Smokeless tobacco: Never Used  . Alcohol Use: 0.0 oz/week    0 Standard drinks or equivalent per week     Comment: occasional    Review of Systems ROS +'ve body aches, sore throat  Denies: HEADACHE, NAUSEA, ABDOMINAL PAIN, CHEST PAIN, CONGESTION, DYSURIA, SHORTNESS OF BREATH  Allergies  Review of patient's allergies indicates no known allergies.  Home Medications   Prior to Admission medications   Medication Sig Start Date End Date Taking? Authorizing Provider  Darunavir Ethanolate (PREZISTA) 800 MG tablet Take 1 tablet (800 mg total) by mouth daily. 09/19/14  Yes Gardiner Barefoot, MD  ritonavir (NORVIR) 100 MG TABS tablet Take 1 tablet (100 mg total) by mouth daily. 09/19/14  Yes Gardiner Barefoot, MD  Abacavir-Dolutegravir-Lamivud 600-50-300 MG TABS Take 1 tablet by mouth daily. Patient not taking: Reported on 03/21/2015 09/19/14   Gardiner Barefoot, MD  megestrol (MEGACE ES) 625 MG/5ML suspension Take 5 mLs (625 mg total) by mouth daily. 09/19/14   Gardiner Barefoot, MD  methocarbamol (ROBAXIN) 500 MG tablet Take 1 tablet (500 mg total) by mouth 3 (three) times daily between meals as needed. 03/21/15   Rolland Porter, MD  naproxen (NAPROSYN) 500 MG tablet Take 1  tablet (500 mg total) by mouth 2 (two) times daily. 03/21/15   Rolland Porter, MD   Meds Ordered and Administered this Visit  Medications - No data to display  BP 125/77 mmHg  Pulse 64  Temp(Src) 97.5 F (36.4 C) (Oral)  Resp 18  SpO2 100% No data found.   Physical Exam  Constitutional: He is oriented to person, place, and time. He appears well-developed and well-nourished.  HENT:  Head: Normocephalic and atraumatic.  Right Ear: Tympanic membrane is injected, erythematous and bulging. Tympanic membrane mobility is abnormal.  Left Ear: External ear normal.  Mouth/Throat: Oropharynx is clear and moist.  Eyes: Conjunctivae are normal.  Neck: Normal range of motion. Neck supple.  Cardiovascular: Normal rate.   Pulmonary/Chest: Effort normal and breath sounds normal.  Abdominal: Soft. Bowel sounds are normal.  Musculoskeletal: Normal range of motion.  Neurological: He is alert and oriented to person, place, and time.  Skin: Skin is warm and dry.  Psychiatric: He has a normal mood and affect. His behavior is normal.  Nursing note and vitals reviewed.   ED Course  Procedures (including critical care time)  Labs Review Labs Reviewed - No data to display  Imaging Review No results found.   Visual Acuity Review  Right Eye Distance:   Left Eye Distance:   Bilateral Distance:    Right Eye Near:   Left Eye Near:    Bilateral Near:         MDM  1. Acute right otitis media, recurrence not specified, unspecified otitis media type   2. Flu-like symptoms    Patient is advised to continue home symptomatic treatment. Prescription for amoxil sent pharmacy patient has indicated. Patient is advised that if there are new or worsening symptoms or attend the emergency department, or contact primary care provider. Instructions of care provided discharged home in stable condition. Return to work note provided.  THIS NOTE WAS GENERATED USING A VOICE RECOGNITION SOFTWARE PROGRAM.  ALL REASONABLE EFFORTS  WERE MADE TO PROOFREAD THIS DOCUMENT FOR ACCURACY.     Tharon Aquas, PA 07/06/15 2016

## 2015-07-08 LAB — CULTURE, GROUP A STREP (THRC)

## 2015-08-28 ENCOUNTER — Other Ambulatory Visit: Payer: Self-pay | Admitting: Internal Medicine

## 2015-08-28 ENCOUNTER — Other Ambulatory Visit: Payer: BC Managed Care – PPO

## 2015-08-28 DIAGNOSIS — B20 Human immunodeficiency virus [HIV] disease: Secondary | ICD-10-CM

## 2015-08-28 DIAGNOSIS — Z113 Encounter for screening for infections with a predominantly sexual mode of transmission: Secondary | ICD-10-CM

## 2015-08-28 LAB — T-HELPER CELL (CD4) - (RCID CLINIC ONLY)
CD4 % Helper T Cell: 17 % — ABNORMAL LOW (ref 33–55)
CD4 T Cell Abs: 370 /uL — ABNORMAL LOW (ref 400–2700)

## 2015-08-29 LAB — RPR

## 2015-08-31 LAB — URINE CYTOLOGY ANCILLARY ONLY
CHLAMYDIA, DNA PROBE: NEGATIVE
NEISSERIA GONORRHEA: NEGATIVE

## 2015-09-01 ENCOUNTER — Telehealth: Payer: Self-pay | Admitting: *Deleted

## 2015-09-01 LAB — HIV-1 RNA QUANT-NO REFLEX-BLD
HIV 1 RNA QUANT: 15397 {copies}/mL — AB (ref <20)
HIV-1 RNA QUANT, LOG: 4.19 {Log_copies}/mL — AB (ref <1.30)

## 2015-09-01 NOTE — Telephone Encounter (Signed)
Patient called for his lab results, specifically the STD tests. RN advised that we did not test all orifices, only urine, for gonorrhea and chlamydia (negative result).  RPR was negative as well.  RN advised patient that if he was concerned regarding other exposure, he would have to have oral and rectal testing at his appointment.  RN encouraged patient to keep his follow up appointment in May as not all of his labs have resulted as of yet. Patient verbalized understanding and agreement. Andree CossHowell, Shaquelle Hernon M, RN

## 2015-09-02 ENCOUNTER — Telehealth: Payer: Self-pay | Admitting: *Deleted

## 2015-09-02 NOTE — Telephone Encounter (Signed)
Called the lab and added genotype

## 2015-09-02 NOTE — Telephone Encounter (Signed)
-----   Message from Gardiner Barefootobert W Comer, MD sent at 09/02/2015  7:39 AM EDT ----- His virus is detectable despite Triumeq + prezista norvir.  Is he taking it?  Have him see Minh asap and sort it out.  Add a gentoype too, thanks.

## 2015-09-07 ENCOUNTER — Ambulatory Visit (INDEPENDENT_AMBULATORY_CARE_PROVIDER_SITE_OTHER): Payer: BC Managed Care – PPO | Admitting: Pharmacist Clinician (PhC)/ Clinical Pharmacy Specialist

## 2015-09-07 DIAGNOSIS — B20 Human immunodeficiency virus [HIV] disease: Secondary | ICD-10-CM

## 2015-09-07 MED ORDER — EMTRICITABINE-TENOFOVIR AF 200-25 MG PO TABS: 1.0000 | ORAL_TABLET | Freq: Every day | ORAL | Status: DC

## 2015-09-07 MED ORDER — DOLUTEGRAVIR SODIUM 50 MG PO TABS: 50.0000 mg | ORAL_TABLET | Freq: Every day | ORAL | Status: DC

## 2015-09-07 MED ORDER — ONDANSETRON HCL 4 MG PO TABS: 4.0000 mg | ORAL_TABLET | Freq: Every day | ORAL | Status: DC

## 2015-09-07 MED ORDER — DARUNAVIR-COBICISTAT 800-150 MG PO TABS: 1.0000 | ORAL_TABLET | Freq: Every day | ORAL | Status: DC

## 2015-09-07 MED FILL — DESCOVY 200-25 MG TABS: 200-25 | 30 days supply | Qty: 30 | Fill #0

## 2015-09-07 MED FILL — TIVICAY 50 MG TABLET: 50 | 30 days supply | Qty: 30 | Fill #0

## 2015-09-07 MED FILL — PREZCOBIX 800 MG-150 MG TAB: 800-150 | 30 days supply | Qty: 30 | Fill #0

## 2015-09-07 MED FILL — ONDANSETRON HCL 4 MG TABLET: 4 | 30 days supply | Qty: 30 | Fill #0

## 2015-09-07 NOTE — Progress Notes (Signed)
Patient ID: Cory Russell, male   DOB: Oct 15, 1990, 25 y.o.   MRN: [id number] HPI: Cory Russell is a 25 y.o. male who is here after referral for his uncontrolled HIV VL.   Allergies: No Known Allergies  Vitals:    Past Medical History: Past Medical History  Diagnosis Date  . HIV (human immunodeficiency virus infection) (HCC)   . Hypertension   . Diabetes mellitus without complication [facility](HCC)     Social History: Social History   Social History  . Marital Status: Single    Spouse Name: N/A  . Number of Children: N/A  . Years of Education: N/A   Social History Main Topics  . Smoking status: Never Smoker   . Smokeless tobacco: Never Used  . Alcohol Use: 0.0 oz/week    0 Standard drinks or equivalent per week     Comment: occasional  . Drug Use: Yes    Special: Marijuana     Comment: marijuana-- occ  . Sexual Activity:    Partners: Male     Comment: pt given condoms   Other Topics Concern  . Not on file   Social History Narrative    Previous Regimen: ATP>>TRV/ATVr>>Stribild (according to pt)  Current Regimen: Off of Triumeq/DRV/r x 2 months  Labs: HIV 1 RNA QUANT (copies/mL)  Date Value  08/28/2015 15397*  01/12/2015 116*  10/23/2014 <20   CD4 T CELL ABS (/uL)  Date Value  08/28/2015 370*  01/12/2015 710  10/23/2014 460   HEP B S AB (no units)  Date Value  12/02/2013 POS*   HEPATITIS B SURFACE AG (no units)  Date Value  12/02/2013 NEGATIVE   HCV AB (no units)  Date Value  12/02/2013 NEGATIVE    CrCl: CrCl cannot be calculated (Unknown ideal weight.).  Lipids:    Component Value Date/Time   CHOL 148 01/12/2015 1023   TRIG 84 01/12/2015 1023   HDL 36* 01/12/2015 1023   CHOLHDL 4.1 01/12/2015 1023   VLDL 17 01/12/2015 1023   LDLCALC 95 01/12/2015 1023   HIV Genotype Composite Data Genotype Dates:   Mutations in Bold impact drug susceptibility RT Mutations D67N, K70R, M184V, K219E, V179E, Y181C  PI Mutations None  Integrase Mutations  T66I   Interpretation of Genotype Data per [employer] HIV Database Nucleoside RTIs  abacavir (ABC) High-Level Resistance zidovudine (AZT) Intermediate Resistance emtricitabine (FTC) High-Level Resistance lamivudine (3TC) High-Level Resistance tenofovir (TDF) Low-Level Resistance   Non-Nucleoside RTIs  efavirenz (EFV) Intermediate Resistance etravirine (ETR) Intermediate Resistance nevirapine (NVP) High-Level Resistance rilpivirine (RPV) Intermediate Resistance   Protease Inhibitors  atazanavir/r (ATV/r) Susceptible darunavir/r (DRV/r) Susceptible lopinavir/r (LPV/r) Susceptible   Integrase Inhibitors  dolutegravir (DTG) Susceptible elvitegravir (EVG) High-Level Resistance raltegravir (RAL) Low-Level Resistance    Assessment:  Cory Russell is here with his Mom to discuss his HIV issue. He was started on ATP>>ATV/r>>Stribild>>DRV/r/Triumeq. He stated that the reason for some of the change in his ART was due to side effects issue and adherence issue after the new ART. He has been off of ART completely in the past 2 mo. He has acquired quite a few resistance mutations. NRTI/NNRTIs/Integrase are almost gone. See table above. I spent a lot of time talking to him and his mom about the limited choice that we have at this point. Stressed to him that no matter what we give him, it would failed if he doesn't take it. The primary issue might be nausea and food intake. Stressed to him the importance of not missing due to food  and it's ok to take with a small meal if he can't take a large meal. We are going to give him a schedule dose of zofran with his daily ART that he usually take at 8pm each night because that is the most consistent time for his meal. Recent data has shown that when DTG is given with cobicistat, it can boost the level up to almost BID dosing. This might be beneficial here due to his extensive mutations. We still have some activity left for TAF so we are going to use Descovy. He is still  on his mom's insurance coverage. I think will probably need ADAP at some point after he is kicked off of it.   Recommendations: Dc DRV/r/Triumeq Start DTG  PO qday Start Prezcobix 1 PO qday Start Descovy 1 PO qday Start Zofran  PO qday Come back and see me in 2 wks to check  Clide Cliff, PharmD Clinical Infectious Disease Pharmacist [facility] 09/07/2015, 10:50 AM

## 2015-09-07 NOTE — Patient Instructions (Signed)
Stop Prezista/Norvir/Triumeq Start Tivicay/Descovy/Prezcobix Start Zofran everyday at the same time with the HIV medications Come back to see me in 2 wks

## 2015-09-08 LAB — HIV-1 GENOTYPR PLUS

## 2015-09-21 ENCOUNTER — Ambulatory Visit: Payer: BC Managed Care – PPO | Admitting: Pharmacist

## 2015-09-21 DIAGNOSIS — B20 Human immunodeficiency virus [HIV] disease: Secondary | ICD-10-CM

## 2015-09-21 NOTE — Progress Notes (Signed)
Patient ID: Cory Russell, male   DOB: [birth year], 25 y.o.   MRN: [id number]  HPI: Cory ShawlCurtis Russell is a 25 y.o. male here for a 2 week follow up after starting his new HIV medication regimen. Cory Russell reports adherence to his new regimen with no missed doses. He does report constant stomach upset without associated nausea, vomiting, or diarrhea. He does take Zofran daily with his medications and believes this has helped avoid nausea. Nothing has made his stomach upset better or worse since starting the medications.  Allergies: No Known Allergies  Past Medical History: Past Medical History  Diagnosis Date  . HIV (human immunodeficiency virus infection) (HCC)   . Hypertension   . Diabetes mellitus without complication [facility](HCC)     Social History: Social History   Social History  . Marital Status: Single    Spouse Name: N/A  . Number of Children: N/A  . Years of Education: N/A   Social History Main Topics  . Smoking status: Never Smoker   . Smokeless tobacco: Never Used  . Alcohol Use: 0.0 oz/week    0 Standard drinks or equivalent per week     Comment: occasional  . Drug Use: Yes    Special: Marijuana     Comment: marijuana-- occ  . Sexual Activity:    Partners: Male     Comment: pt given condoms   Other Topics Concern  . Not on file   Social History Narrative    Previous Regimen: Tivicay, Prezcobix, Descovy  Current Regimen: Same as above  Labs: HIV 1 RNA QUANT (copies/mL)  Date Value  08/28/2015 15397*  01/12/2015 116*  10/23/2014 <20   CD4 T CELL ABS (/uL)  Date Value  08/28/2015 370*  01/12/2015 710  10/23/2014 460   HEP B S AB (no units)  Date Value  12/02/2013 POS*   HEPATITIS B SURFACE AG (no units)  Date Value  12/02/2013 NEGATIVE   HCV AB (no units)  Date Value  12/02/2013 NEGATIVE    CrCl: CrCl cannot be calculated (Unknown ideal weight.).  Lipids:    Component Value Date/Time   CHOL 148 01/12/2015 1023   TRIG 84 01/12/2015 1023   HDL 36* 01/12/2015 1023   CHOLHDL 4.1 01/12/2015 1023   VLDL 17 01/12/2015 1023   LDLCALC 95 01/12/2015 1023    Assessment: Cory Russell has uncontrolled HIV with an elevated VL and low CD4 count last drawn in March 2017. His HIV treatment is complicated secondary to history of noncompliance and multiple mutations. He is currently on a salvage regimen of Prezcobix, Tivicay, and Descovy. He is in agreement with continuing this medication regimen despite his stomach upset for 2 more weeks to see if there is any improvement in his stomach upset and VL.   Recommendations: Continue taking Prezcobix, Tivicay, and Descovy once daily with a meal along with Zofran to prevent nausea. Return to clinic in 2 weeks for lab work to assess viral load response to new regimen Follow up with Dr. Luciana Axeomer on 10/12/15 for lab results Counseled on adherence and taking his medications with a meal  Greggory Stallionristy Reyes, PharmD Clinical Pharmacy Resident Pager # [phone] 09/21/2015 10:44 AM

## 2015-09-21 NOTE — Patient Instructions (Addendum)
Please continue taking Prezcobix, Tivicay, and Descovy daily along with Zofran. Return in 2 weeks for lab work prior to your appointment with Dr. Luciana Axeomer on 10/12/15.

## 2015-09-30 ENCOUNTER — Other Ambulatory Visit: Payer: Self-pay | Admitting: *Deleted

## 2015-09-30 ENCOUNTER — Telehealth: Payer: Self-pay | Admitting: *Deleted

## 2015-09-30 NOTE — Telephone Encounter (Signed)
Patient called stating he lost his HIV meds and [place] new [place]. Explained that the insurance will not cover these meds early. Asked when he last filled it and he said early April. Advised he should be able to fill it next week. Unfortunately we do not have samples of these meds. Thought he could pay out of pocket and explained they cost several thousand dollars. Wendall MolaJacqueline Cockerham

## 2015-10-02 ENCOUNTER — Other Ambulatory Visit: Payer: Self-pay | Admitting: *Deleted

## 2015-10-02 MED ORDER — DOLUTEGRAVIR SODIUM 50 MG PO TABS: 50.0000 mg | ORAL_TABLET | Freq: Every day | ORAL | Status: DC

## 2015-10-02 MED ORDER — EMTRICITABINE-TENOFOVIR AF 200-25 MG PO TABS: 1.0000 | ORAL_TABLET | Freq: Every day | ORAL | Status: DC

## 2015-10-02 MED ORDER — DARUNAVIR-COBICISTAT 800-150 MG PO TABS: 1.0000 | ORAL_TABLET | Freq: Every day | ORAL | Status: DC

## 2015-10-06 ENCOUNTER — Telehealth: Payer: Self-pay | Admitting: Pharmacy Technician

## 2015-10-06 NOTE — Telephone Encounter (Signed)
Left message to call.  He needs to call his specialty pharmacy (mail order) at [phone]) for future HIV med fills.

## 2015-10-07 ENCOUNTER — Other Ambulatory Visit: Payer: BC Managed Care – PPO

## 2015-10-07 ENCOUNTER — Telehealth: Payer: Self-pay | Admitting: Pharmacy Technician

## 2015-10-07 DIAGNOSIS — B20 Human immunodeficiency virus [HIV] disease: Secondary | ICD-10-CM

## 2015-10-07 NOTE — Telephone Encounter (Signed)
Left VM, must get meds thr. insurance mail order...no longer thr. [facility] pharmacy.

## 2015-10-08 LAB — T-HELPER CELL (CD4) - (RCID CLINIC ONLY)
CD4 % Helper T Cell: 18 % — ABNORMAL LOW (ref 33–55)
CD4 T Cell Abs: 390 /uL — ABNORMAL LOW (ref 400–2700)

## 2015-10-08 LAB — HIV-1 RNA QUANT-NO REFLEX-BLD: HIV 1 RNA Quant: <20 copies/mL (ref <20)

## 2015-10-12 ENCOUNTER — Ambulatory Visit (INDEPENDENT_AMBULATORY_CARE_PROVIDER_SITE_OTHER): Payer: BC Managed Care – PPO | Admitting: Internal Medicine

## 2015-10-12 VITALS — BP 127/87 | HR 96 | Temp 98.0°F | Wt 119.0 lb

## 2015-10-12 DIAGNOSIS — B20 Human immunodeficiency virus [HIV] disease: Secondary | ICD-10-CM | POA: Diagnosis not present

## 2015-10-12 DIAGNOSIS — Z113 Encounter for screening for infections with a predominantly sexual mode of transmission: Secondary | ICD-10-CM

## 2015-10-12 DIAGNOSIS — R630 Anorexia: Secondary | ICD-10-CM

## 2015-10-12 DIAGNOSIS — Z79899 Other long term (current) drug therapy: Secondary | ICD-10-CM

## 2015-10-12 NOTE — Assessment & Plan Note (Signed)
Will continue megace.

## 2015-10-12 NOTE — Assessment & Plan Note (Signed)
No issues.  Will follow up in 3 months.

## 2015-10-12 NOTE — Progress Notes (Signed)
CC: Follow up for HIV  Interval history: He comes in for follow up after starting a new salvage regimen.  He had been on Atripla in the past, then some reported resistance at an outside facility and then went on Stribild in September of 2013.  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  He then changed to Prezista, norvir and Triumeq but again fell out of care.  He returned and got labs and noted he was off of medications.  He came and saw Minh of pharmacy and restarted medications with Prezcobix, tivicay and Descovy.  He is getting zofran as well with some stomach upset.  Poor weight gain and asking for megace.   Prior to Admission medications   Medication Sig Start Date End Date Taking? Authorizing Provider  darunavir-cobicistat (PREZCOBIX) 800-150 MG tablet Take 1 tablet by mouth daily. Can Crush or Cut. Take with food. 10/02/15   Gardiner Barefootobert W Valyncia Wiens, MD  dolutegravir (TIVICAY) 50 MG tablet Take 1 tablet (50 mg total) by mouth daily. 10/02/15   Gardiner Barefootobert W Jasmyne Lodato, MD  emtricitabine-tenofovir AF (DESCOVY) 200-25 MG tablet Take 1 tablet by mouth daily. 10/02/15   Gardiner Barefootobert W Lunabelle Oatley, MD  megestrol (MEGACE ES) 625 MG/5ML suspension Take 5 mLs (625 mg total) by mouth daily. 09/19/14   Gardiner Barefootobert W Shyna Duignan, MD  ondansetron (ZOFRAN) 4 MG tablet Take 1 tablet (4 mg total) by mouth daily at 8 pm. Take with all the other meds 09/07/15   Gardiner Barefootobert W Norman Piacentini, MD    Review of Systems Constitutional: negative for fatigue and malaise Gastrointestinal: negative for diarrhea Musculoskeletal: negative for myalgias and arthralgias All other systems reviewed and are negative   Physical Exam: CONSTITUTIONAL:in no apparent distress and alert  Eyes: anicteric HENT: no thrush, no cervical lymphadenopathy Respiratory: Normal respiratory effort; CTA B  Lab Results  Component Value Date   HIV1RNAQUANT <20 10/07/2015   HIV1RNAQUANT 15397* 08/28/2015   HIV1RNAQUANT 116* 01/12/2015    No components found for: HIV1GENOTYPRPLUS No components found for: THELPERCELL

## 2015-10-26 ENCOUNTER — Other Ambulatory Visit: Payer: Self-pay | Admitting: *Deleted

## 2015-10-26 DIAGNOSIS — R630 Anorexia: Secondary | ICD-10-CM

## 2015-10-26 DIAGNOSIS — B20 Human immunodeficiency virus [HIV] disease: Secondary | ICD-10-CM

## 2015-10-26 MED ORDER — MEGESTROL ACETATE 625 MG/5ML PO SUSP: 625.0000 mg | Freq: Every day | ORAL | Status: DC

## 2015-10-26 MED ORDER — ONDANSETRON HCL 4 MG PO TABS: 4.0000 mg | ORAL_TABLET | Freq: Every day | ORAL | Status: DC

## 2016-01-25 ENCOUNTER — Encounter (HOSPITAL_COMMUNITY): Payer: Self-pay | Admitting: Family Medicine

## 2016-01-25 ENCOUNTER — Ambulatory Visit (HOSPITAL_COMMUNITY)
Admission: EM | Admit: 2016-01-25 | Discharge: 2016-01-25 | Disposition: A | Discharge status: Home / Self Care | Payer: BC Managed Care – PPO | Attending: Emergency Medicine | Admitting: Emergency Medicine

## 2016-01-25 DIAGNOSIS — K644 Residual hemorrhoidal skin tags: Secondary | ICD-10-CM

## 2016-01-25 DIAGNOSIS — K648 Other hemorrhoids: Secondary | ICD-10-CM

## 2016-01-25 MED ORDER — HYDROCORTISONE ACE-PRAMOXINE 2.5-1 % RE CREA: 1.0000 "application " | TOPICAL_CREAM | Freq: Three times a day (TID) | RECTAL | 0 refills | Status: DC

## 2016-01-25 NOTE — ED Triage Notes (Signed)
Pt here for external hemoroid . sts x 1 week with hx of same. sts that he was given a compound a few years a it was very helpful.

## 2016-01-25 NOTE — ED Provider Notes (Signed)
CSN: [id number]     Arrival date & time 01/25/16  1207 History   First MD Initiated Contact with Patient 01/25/16 1357     Chief Complaint  Patient presents with  . Hemorrhoids   (Consider location/radiation/quality/duration/timing/severity/associated sxs/prior Treatment) 25 year old male complaining of painful hemorrhoids for one week. He states about 3 weeks ago he had been constipated and straining. Complaining of pain with bowel movement. No current bleeding. History of hemorrhoids with the last one being approximately 2 years ago.      Past Medical History:  Diagnosis Date  . Diabetes mellitus without complication (HCC)   . HIV (human immunodeficiency virus infection) (HCC)   . Hypertension    History reviewed. No pertinent surgical history. Family History  Problem Relation Age of Onset  . Diabetes Mother   . Hypertension Father    Social History  Substance Use Topics  . Smoking status: Never Smoker  . Smokeless tobacco: Never Used  . Alcohol use 0.0 oz/week     Comment: occasional    Review of Systems  Constitutional: Negative.   Gastrointestinal: Positive for rectal pain. Negative for abdominal pain, blood in stool, nausea and vomiting.       As per history of present illness  Genitourinary: Negative.   Musculoskeletal: Negative.   Neurological: Negative.   All other systems reviewed and are negative.   Allergies  Review of patient's allergies indicates no known allergies.  Home Medications   Prior to Admission medications   Medication Sig Start Date End Date Taking? Authorizing Provider  darunavir-cobicistat (PREZCOBIX) 800-150 MG tablet Take 1 tablet by mouth daily. Can Crush or Cut. Take with food. 10/02/15   Gardiner Barefootobert W Comer, MD  dolutegravir (TIVICAY) 50 MG tablet Take 1 tablet (50 mg total) by mouth daily. 10/02/15   Gardiner Barefootobert W Comer, MD  emtricitabine-tenofovir AF (DESCOVY) 200-25 MG tablet Take 1 tablet by mouth daily. 10/02/15   Gardiner Barefootobert W Comer, MD   hydrocortisone-pramoxine [facility](ANALPRAM HC) 2.5-1 % rectal cream Place 1 application rectally 3 (three) times daily. 01/25/16   Hayden Rasmussenavid Dearl Rudden, NP  megestrol (MEGACE ES) 625 MG/5ML suspension Take 5 mLs (625 mg total) by mouth daily. 10/26/15   Gardiner Barefootobert W Comer, MD  ondansetron (ZOFRAN) 4 MG tablet Take 1 tablet (4 mg total) by mouth daily at 8 pm. Take with all the other meds 10/26/15   Gardiner Barefootobert W Comer, MD   Meds Ordered and Administered this Visit  Medications - No data to display  BP 133/78   Pulse 75   Temp 98.4 F (36.9 C)   Resp 16   SpO2 99%  No data found.   Physical Exam  Constitutional: He is oriented to person, place, and time. He appears well-developed and well-nourished. No distress.  HENT:  Head: Normocephalic and atraumatic.  Eyes: EOM are normal.  Neck: Normal range of motion. Neck supple.  Cardiovascular: Normal rate.   Pulmonary/Chest: Effort normal. No respiratory distress.  Genitourinary:  Genitourinary Comments: Small non-thrombotic hemorrhoid at 4:00 position. Tender to palpation. No bleeding.  Musculoskeletal: He exhibits no edema.  Neurological: He is alert and oriented to person, place, and time. He exhibits normal muscle tone.  Skin: Skin is warm and dry.  Psychiatric: He has a normal mood and affect.  Nursing note and vitals reviewed.   Urgent Care Course   Clinical Course    Procedures (including critical care time)  Labs Review Labs Reviewed - No data to display  Imaging Review No results found.  Visual Acuity Review  Right Eye Distance:   Left Eye Distance:   Bilateral Distance:    Right Eye Near:   Left Eye Near:    Bilateral Near:         MDM   1. External hemorrhoid    Meds ordered this encounter  Medications  . hydrocortisone-pramoxine (ANALPRAM HC) 2.5-1 % rectal cream    Sig: Place 1 application rectally 3 (three) times daily.    Dispense:  30 g    Refill:  0    Order Specific Question:   Supervising Provider    Answer:    Linna HoffKINDL, JAMES D [[id number]]   Stool softeners, Plenty of water, veggies and fruits. Meds ordered this encounter  Medications  . hydrocortisone-pramoxine (ANALPRAM HC) 2.5-1 % rectal cream    Sig: Place 1 application rectally 3 (three) times daily.    Dispense:  30 g    Refill:  0    Order Specific Question:   Supervising Provider    Answer:   Linna HoffKINDL, JAMES D [[id number]]      Hayden Rasmussenavid Clydette Privitera, NP 01/25/16 1425

## 2016-01-26 ENCOUNTER — Other Ambulatory Visit (HOSPITAL_COMMUNITY)
Admission: RE | Admit: 2016-01-26 | Discharge: 2016-01-26 | Disposition: A | Discharge status: Home / Self Care | Payer: BC Managed Care – PPO | Source: Ambulatory Visit | Attending: Internal Medicine | Admitting: Internal Medicine

## 2016-01-26 ENCOUNTER — Other Ambulatory Visit: Payer: BC Managed Care – PPO

## 2016-01-26 DIAGNOSIS — Z113 Encounter for screening for infections with a predominantly sexual mode of transmission: Secondary | ICD-10-CM | POA: Diagnosis not present

## 2016-01-26 DIAGNOSIS — Z79899 Other long term (current) drug therapy: Secondary | ICD-10-CM

## 2016-01-26 DIAGNOSIS — B20 Human immunodeficiency virus [HIV] disease: Secondary | ICD-10-CM

## 2016-01-26 LAB — LIPID PANEL
Cholesterol: 168 mg/dL (ref 125–200)
HDL: 41 mg/dL (ref >=40)
LDL CALC: 110 mg/dL (ref <130)
TRIGLYCERIDES: 84 mg/dL (ref <150)
Total CHOL/HDL Ratio: 4.1 Ratio (ref <=5.0)
VLDL: 17 mg/dL (ref <30)

## 2016-01-26 LAB — COMPLETE METABOLIC PANEL WITH GFR
ALBUMIN: 4.6 g/dL (ref 3.6–5.1)
ALT: 12 U/L (ref 9–46)
AST: 15 U/L (ref 10–40)
Alkaline Phosphatase: 43 U/L (ref 40–115)
BILIRUBIN TOTAL: 0.6 mg/dL (ref 0.2–1.2)
BUN: 16 mg/dL (ref 7–25)
CALCIUM: 10 mg/dL (ref 8.6–10.3)
CHLORIDE: 106 mmol/L (ref 98–110)
CO2: 26 mmol/L (ref 20–31)
CREATININE: 1.2 mg/dL (ref 0.60–1.35)
GFR, Est Non African American: 84 mL/min (ref >=60)
Glucose, Bld: 107 mg/dL — ABNORMAL HIGH (ref 65–99)
Potassium: 4 mmol/L (ref 3.5–5.3)
Sodium: 141 mmol/L (ref 135–146)
TOTAL PROTEIN: 7.3 g/dL (ref 6.1–8.1)

## 2016-01-26 LAB — CBC WITH DIFFERENTIAL/PLATELET
Basophils Absolute: 0 cells/uL (ref 0–200)
Basophils Relative: 0 %
Eosinophils Absolute: 0 cells/uL — ABNORMAL LOW (ref 15–500)
Eosinophils Relative: 0 %
HEMATOCRIT: 37.5 % — AB (ref 38.5–50.0)
Hemoglobin: 12.2 g/dL — ABNORMAL LOW (ref 13.2–17.1)
LYMPHS PCT: 40 %
Lymphs Abs: 1880 cells/uL (ref 850–3900)
MCH: 29.8 pg (ref 27.0–33.0)
MCHC: 32.5 g/dL (ref 32.0–36.0)
MCV: 91.7 fL (ref 80.0–100.0)
MONO ABS: 423 {cells}/uL (ref 200–950)
MONOS PCT: 9 %
MPV: 8.4 fL (ref 7.5–12.5)
NEUTROS PCT: 51 %
Neutro Abs: 2397 cells/uL (ref 1500–7800)
PLATELETS: 235 10*3/uL (ref 140–400)
RBC: 4.09 MIL/uL — AB (ref 4.20–5.80)
RDW: 15 % (ref 11.0–15.0)
WBC: 4.7 10*3/uL (ref 3.8–10.8)

## 2016-01-27 LAB — URINE CYTOLOGY ANCILLARY ONLY
Chlamydia: NEGATIVE
NEISSERIA GONORRHEA: NEGATIVE

## 2016-01-27 LAB — T-HELPER CELL (CD4) - (RCID CLINIC ONLY)
CD4 T CELL ABS: 370 /uL — AB (ref 400–2700)
CD4 T CELL HELPER: 20 % — AB (ref 33–55)

## 2016-01-27 LAB — RPR

## 2016-01-28 LAB — HIV-1 RNA QUANT-NO REFLEX-BLD: HIV-1 RNA Quant, Log: <1.3 Log copies/mL (ref <1.30)

## 2016-02-09 ENCOUNTER — Encounter: Payer: Self-pay | Admitting: Internal Medicine

## 2016-02-09 ENCOUNTER — Ambulatory Visit (INDEPENDENT_AMBULATORY_CARE_PROVIDER_SITE_OTHER): Payer: BC Managed Care – PPO | Admitting: Internal Medicine

## 2016-02-09 ENCOUNTER — Other Ambulatory Visit: Payer: Self-pay | Admitting: Pharmacist Clinician (PhC)/ Clinical Pharmacy Specialist

## 2016-02-09 ENCOUNTER — Other Ambulatory Visit: Payer: Self-pay | Admitting: *Deleted

## 2016-02-09 VITALS — BP 145/92 | HR 65 | Temp 97.9°F | Wt 131.0 lb

## 2016-02-09 DIAGNOSIS — Z23 Encounter for immunization: Secondary | ICD-10-CM | POA: Diagnosis not present

## 2016-02-09 DIAGNOSIS — F329 Major depressive disorder, single episode, unspecified: Secondary | ICD-10-CM

## 2016-02-09 DIAGNOSIS — R630 Anorexia: Secondary | ICD-10-CM

## 2016-02-09 DIAGNOSIS — B20 Human immunodeficiency virus [HIV] disease: Secondary | ICD-10-CM

## 2016-02-09 DIAGNOSIS — F32A Depression, unspecified: Secondary | ICD-10-CM

## 2016-02-09 MED ORDER — DOLUTEGRAVIR SODIUM 50 MG PO TABS: 50.0000 mg | ORAL_TABLET | Freq: Every day | ORAL | 3 refills | Status: DC

## 2016-02-09 MED ORDER — DOLUTEGRAVIR SODIUM 50 MG PO TABS: 50.0000 mg | ORAL_TABLET | Freq: Every day | ORAL | 11 refills | Status: DC

## 2016-02-09 MED ORDER — EMTRICITABINE-TENOFOVIR AF 200-25 MG PO TABS: 1.0000 | ORAL_TABLET | Freq: Every day | ORAL | 3 refills | Status: DC

## 2016-02-09 MED ORDER — DARUNAVIR-COBICISTAT 800-150 MG PO TABS: 1.0000 | ORAL_TABLET | Freq: Every day | ORAL | 5 refills | Status: DC

## 2016-02-09 MED ORDER — EMTRICITABINE-TENOFOVIR AF 200-25 MG PO TABS: 1.0000 | ORAL_TABLET | Freq: Every day | ORAL | 5 refills | Status: DC

## 2016-02-09 MED ORDER — DARUNAVIR-COBICISTAT 800-150 MG PO TABS: 1.0000 | ORAL_TABLET | Freq: Every day | ORAL | 11 refills | Status: DC

## 2016-02-09 MED ORDER — MEGESTROL ACETATE 625 MG/5ML PO SUSP: 625.0000 mg | Freq: Every day | ORAL | 5 refills | Status: DC

## 2016-02-09 MED ORDER — DOLUTEGRAVIR SODIUM 50 MG PO TABS: 50.0000 mg | ORAL_TABLET | Freq: Every day | ORAL | 5 refills | Status: DC

## 2016-02-09 MED ORDER — ONDANSETRON HCL 4 MG PO TABS: 4.0000 mg | ORAL_TABLET | Freq: Every day | ORAL | 11 refills | Status: DC

## 2016-02-09 MED ORDER — EMTRICITABINE-TENOFOVIR AF 200-25 MG PO TABS: 1.0000 | ORAL_TABLET | Freq: Every day | ORAL | 11 refills | Status: DC

## 2016-02-09 MED ORDER — DARUNAVIR-COBICISTAT 800-150 MG PO TABS: 1.0000 | ORAL_TABLET | Freq: Every day | ORAL | 3 refills | Status: DC

## 2016-02-09 MED ORDER — ONDANSETRON HCL 4 MG PO TABS: 4.0000 mg | ORAL_TABLET | Freq: Every day | ORAL | 3 refills | Status: DC

## 2016-02-09 NOTE — Assessment & Plan Note (Signed)
Much improved with megace.  Can taper off as needed.

## 2016-02-09 NOTE — Assessment & Plan Note (Addendum)
Unfortunately out again. I counseled him on the importance of staying on it.  He additionally after the visit let us know he was going to lose his insurance on his birthday from his mother's insurance.   He will get referred for ADAP.  He is going [facility] and then apply for ADAP.  He will follow up with Pharm D in 5 weeks after repeat labs back on medications and hopefully is getting ADAP approved then follow up with me in 4 months if he get on medication

## 2016-02-09 NOTE — Assessment & Plan Note (Signed)
improved

## 2016-02-09 NOTE — Progress Notes (Signed)
CC: Follow up for HIV  Interval history: He comes in for follow up on a salvage regimen.   He had been on Atripla in the past, then some reported resistance at an outside facility and then went on Stribild in September of 2013.  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  He then changed to Prezista, norvir and Triumeq but again fell out of care.  He returned and got labs and noted he was off of medications.  He was then started on Prezcobix, tivicay and Descovy.  He is getting zofran as well with some stomach upset and continues on megace.  Unfortunately, he is again off of medications for the last 2 weeks.  He ran out after changing his pharmacy when he was out of town but then moved back and did not get them locally.    Prior to Admission medications   Medication Sig Start Date End Date Taking? Authorizing Provider  darunavir-cobicistat (PREZCOBIX) 800-150 MG tablet Take 1 tablet by mouth daily. Can Crush or Cut. Take with food. 10/02/15   Gardiner Barefootobert W Comer, MD  dolutegravir (TIVICAY) 50 MG tablet Take 1 tablet (50 mg total) by mouth daily. 10/02/15   Gardiner Barefootobert W Comer, MD  emtricitabine-tenofovir AF (DESCOVY) 200-25 MG tablet Take 1 tablet by mouth daily. 10/02/15   Gardiner Barefootobert W Comer, MD  megestrol (MEGACE ES) 625 MG/5ML suspension Take 5 mLs (625 mg total) by mouth daily. 09/19/14   Gardiner Barefootobert W Comer, MD  ondansetron (ZOFRAN) 4 MG tablet Take 1 tablet (4 mg total) by mouth daily at 8 pm. Take with all the other meds 09/07/15   Gardiner Barefootobert W Comer, MD    Review of Systems Constitutional: negative for fatigue and malaise Gastrointestinal: negative for diarrhea Musculoskeletal: negative for myalgias and arthralgias All other systems reviewed and are negative   Physical Exam: CONSTITUTIONAL:in no apparent distress and alert  Eyes: anicteric HENT: no thrush, no cervical lymphadenopathy Respiratory: Normal respiratory effort; CTA B  Lab Results  Component  Value Date   HIV1RNAQUANT <20 01/26/2016   HIV1RNAQUANT <20 10/07/2015   HIV1RNAQUANT 15,397 (H) 08/28/2015   No components found for: HIV1GENOTYPRPLUS No components found for: THELPERCELL

## 2016-03-08 ENCOUNTER — Other Ambulatory Visit: Payer: BC Managed Care – PPO

## 2016-03-08 DIAGNOSIS — B20 Human immunodeficiency virus [HIV] disease: Secondary | ICD-10-CM

## 2016-03-08 LAB — CBC WITH DIFFERENTIAL/PLATELET
BASOS ABS: 0 {cells}/uL (ref 0–200)
Basophils Relative: 0 %
EOS PCT: 1 %
Eosinophils Absolute: 37 cells/uL (ref 15–500)
HCT: 36.8 % — ABNORMAL LOW (ref 38.5–50.0)
Hemoglobin: 12 g/dL — ABNORMAL LOW (ref 13.2–17.1)
LYMPHS PCT: 46 %
Lymphs Abs: 1702 cells/uL (ref 850–3900)
MCH: 29.9 pg (ref 27.0–33.0)
MCHC: 32.6 g/dL (ref 32.0–36.0)
MCV: 91.8 fL (ref 80.0–100.0)
MONOS PCT: 12 %
MPV: 8.3 fL (ref 7.5–12.5)
Monocytes Absolute: 444 cells/uL (ref 200–950)
NEUTROS ABS: 1517 {cells}/uL (ref 1500–7800)
NEUTROS PCT: 41 %
PLATELETS: 234 10*3/uL (ref 140–400)
RBC: 4.01 MIL/uL — ABNORMAL LOW (ref 4.20–5.80)
RDW: 13.7 % (ref 11.0–15.0)
WBC: 3.7 10*3/uL — ABNORMAL LOW (ref 3.8–10.8)

## 2016-03-08 LAB — COMPLETE METABOLIC PANEL WITH GFR
ALBUMIN: 4.2 g/dL (ref 3.6–5.1)
ALK PHOS: 50 U/L (ref 40–115)
ALT: 13 U/L (ref 9–46)
AST: 19 U/L (ref 10–40)
BILIRUBIN TOTAL: 0.4 mg/dL (ref 0.2–1.2)
BUN: 14 mg/dL (ref 7–25)
CO2: 26 mmol/L (ref 20–31)
CREATININE: 1.09 mg/dL (ref 0.60–1.35)
Calcium: 9.3 mg/dL (ref 8.6–10.3)
Chloride: 105 mmol/L (ref 98–110)
GFR, Est African American: >89 mL/min (ref >=60)
GLUCOSE: 83 mg/dL (ref 65–99)
Potassium: 3.8 mmol/L (ref 3.5–5.3)
SODIUM: 140 mmol/L (ref 135–146)
TOTAL PROTEIN: 6.8 g/dL (ref 6.1–8.1)

## 2016-03-09 LAB — T-HELPER CELL (CD4) - (RCID CLINIC ONLY)
CD4 T CELL ABS: 460 /uL (ref 400–2700)
CD4 T CELL HELPER: 25 % — AB (ref 33–55)

## 2016-03-10 ENCOUNTER — Telehealth: Payer: Self-pay | Admitting: *Deleted

## 2016-03-10 ENCOUNTER — Ambulatory Visit: Payer: Self-pay

## 2016-03-10 LAB — HIV-1 RNA QUANT-NO REFLEX-BLD
HIV 1 RNA QUANT: 624 {copies}/mL — AB (ref <20)
HIV-1 RNA QUANT, LOG: 2.8 {Log_copies}/mL — AB (ref <1.30)

## 2016-03-10 NOTE — Telephone Encounter (Signed)
Patient called and advised he was to have a referral to Dermatology for skin tags. After review of the patient chart asked him if he has insurance and he advised it termed 02/24/16 as he turned 25. Advised him without insurance he will have to pay out of pocket. He advised his is not ale to do this. Asked him how he was going to pay for his medications and reminded him Dr Luciana AxeOmer wanted him to meet with Olegario MessierKathy about [facility] and he has not. He advised he is not sure about paying for medication and asked if I could make him an appt for today. Advised him yes and he will need to [facility] as well to have a Dermatology referral.  [facility] and reminded [facility] to [facility] as [facility].

## 2016-03-22 ENCOUNTER — Ambulatory Visit: Payer: Self-pay | Admitting: Pharmacist

## 2016-03-22 DIAGNOSIS — B20 Human immunodeficiency virus [HIV] disease: Secondary | ICD-10-CM

## 2016-03-22 MED ORDER — EMTRICITABINE-TENOFOVIR AF 200-25 MG PO TABS: 1.0000 | ORAL_TABLET | Freq: Every day | ORAL | 5 refills | Status: DC

## 2016-03-22 MED ORDER — DARUNAVIR-COBICISTAT 800-150 MG PO TABS: 1.0000 | ORAL_TABLET | Freq: Every day | ORAL | 5 refills | Status: DC

## 2016-03-22 MED ORDER — DOLUTEGRAVIR SODIUM 50 MG PO TABS: 50.0000 mg | ORAL_TABLET | Freq: Every day | ORAL | 5 refills | Status: DC

## 2016-03-22 NOTE — Progress Notes (Signed)
I have seen the patient with our pharmacy resident, Cory Russell, and agree with the assessment and plan. Cory Russell comes in today and states he hasn't taken his medications in a week or so because he ran out.  He did bring in his last pay stub and Olegario MessierKathy was able to enroll him in ADAP.  We also signed him up for [facility] in the mean time.  We urged him to take his medications and explained to him the importance of adherence.

## 2016-03-22 NOTE — Progress Notes (Signed)
HPI: Cory Russell is a 25 y.o. male who presents to the clinic today to follow-up on his medications after missing several weeks of therapy.   Allergies: No Known Allergies  Vitals:    Past Medical History: Past Medical History:  Diagnosis Date  . Diabetes mellitus without complication (HCC)   . HIV (human immunodeficiency virus infection) (HCC)   . Hypertension     Social History: Social History   Social History  . Marital status: Single    Spouse name: N/A  . Number of children: N/A  . Years of education: N/A   Social History Main Topics  . Smoking status: Never Smoker  . Smokeless tobacco: Never Used  . Alcohol use 0.0 oz/week     Comment: occasional  . Drug use:     Types: Marijuana     Comment: marijuana-- occ  . Sexual activity: Yes    Partners: Male     Comment: pt given condoms   Other Topics Concern  . Not on file   Social History Narrative  . No narrative on file     Labs: HIV 1 RNA Quant (copies/mL)  Date Value  03/08/2016 624 (H)  01/26/2016 <20  10/07/2015 <20   CD4 T Cell Abs (/uL)  Date Value  03/08/2016 460  01/26/2016 370 (L)  10/07/2015 390 (L)   Hep B S Ab (no units)  Date Value  12/02/2013 POS (A)   Hepatitis B Surface Ag (no units)  Date Value  12/02/2013 NEGATIVE   HCV Ab (no units)  Date Value  12/02/2013 NEGATIVE    CrCl: CrCl cannot be calculated (Unknown ideal weight.).  Lipids:    Component Value Date/Time   CHOL 168 01/26/2016 1544   TRIG 84 01/26/2016 1544   HDL 41 01/26/2016 1544   CHOLHDL 4.1 01/26/2016 1544   VLDL 17 01/26/2016 1544   LDLCALC 110 01/26/2016 1544    Assessment: Cory Russell presents to the clinic today after missing several weeks of therapy of his Prezcobix, Descovy, and Tivicay. 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 He is still without  insurance after falling off of his parents insurance when he turned 25. We assisted him with filling out an application for ADAP and [facility] today. [facility] and he should receive medications by the end of the week.   Recommendations: Continue Prezcobix, Descovy, and Tivicay.  Finish ADAP application    Cory Russell, PharmD PGY1 Pharmacy Resident  Pager: [phone] 03/22/2016, 4:29 PM

## 2016-03-24 ENCOUNTER — Encounter: Payer: Self-pay | Admitting: Internal Medicine

## 2016-04-29 MED FILL — ONDANSETRON HCL 4 MG TABLET: 4 | 30 days supply | Qty: 30 | Fill #1

## 2016-05-07 ENCOUNTER — Encounter (HOSPITAL_COMMUNITY): Payer: Self-pay | Admitting: Emergency Medicine

## 2016-05-07 ENCOUNTER — Ambulatory Visit (HOSPITAL_COMMUNITY)
Admission: EM | Admit: 2016-05-07 | Discharge: 2016-05-07 | Disposition: A | Discharge status: Home / Self Care | Payer: Self-pay | Attending: Emergency Medicine | Admitting: Emergency Medicine

## 2016-05-07 DIAGNOSIS — Z833 Family history of diabetes mellitus: Secondary | ICD-10-CM | POA: Insufficient documentation

## 2016-05-07 DIAGNOSIS — I10 Essential (primary) hypertension: Secondary | ICD-10-CM | POA: Insufficient documentation

## 2016-05-07 DIAGNOSIS — Z79899 Other long term (current) drug therapy: Secondary | ICD-10-CM | POA: Insufficient documentation

## 2016-05-07 DIAGNOSIS — Z8249 Family history of ischemic heart disease and other diseases of the circulatory system: Secondary | ICD-10-CM | POA: Insufficient documentation

## 2016-05-07 DIAGNOSIS — E109 Type 1 diabetes mellitus without complications: Secondary | ICD-10-CM | POA: Insufficient documentation

## 2016-05-07 DIAGNOSIS — Z113 Encounter for screening for infections with a predominantly sexual mode of transmission: Secondary | ICD-10-CM | POA: Insufficient documentation

## 2016-05-07 DIAGNOSIS — B20 Human immunodeficiency virus [HIV] disease: Secondary | ICD-10-CM | POA: Insufficient documentation

## 2016-05-07 NOTE — ED Triage Notes (Signed)
[facility], he states that his partner has a lesion and wants to be tested. Patient states he is not exhibiting symptoms. No burning with urination or lesions present

## 2016-05-07 NOTE — Discharge Instructions (Signed)
STD screening, cytology and RPR obtained. You will be called with results and treated likely over the phone. Sometimes it is necessary to return for specific injection medication.

## 2016-05-07 NOTE — ED Provider Notes (Signed)
CSN: [id number]     Arrival date & time 05/07/16  1739 History   First MD Initiated Contact with Patient 05/07/16 1859     Chief Complaint  Patient presents with  . S74.5   (Consider location/radiation/quality/duration/timing/severity/associated sxs/prior Treatment) 25 year old male with a history of HIV and type 1 diabetes presents with his sexual partner who thought he had syphilis. The patient states that he has no symptoms. He is not completely asymptomatic. He has no lesions, no urethral discharge or pain, dysuria or other urogenital problems. He is here for screening only.      Past Medical History:  Diagnosis Date  . Diabetes mellitus without complication (HCC)   . HIV (human immunodeficiency virus infection) (HCC)   . Hypertension    History reviewed. No pertinent surgical history. Family History  Problem Relation Age of Onset  . Diabetes Mother   . Hypertension Father    Social History  Substance Use Topics  . Smoking status: Never Smoker  . Smokeless tobacco: Never Used  . Alcohol use 0.0 oz/week     Comment: occasional    Review of Systems  Constitutional: Negative.   Genitourinary: Negative.   Musculoskeletal: Negative.   Skin: Negative.     Allergies  Patient has no known allergies.  Home Medications   Prior to Admission medications   Medication Sig Start Date End Date Taking? Authorizing Provider  darunavir-cobicistat (PREZCOBIX) 800-150 MG tablet Take 1 tablet by mouth daily with breakfast. Swallow whole. Do NOT crush, break or chew tablets. Take with food. 02/09/16  Yes Gardiner Barefootobert W Comer, MD  darunavir-cobicistat (PREZCOBIX) 800-150 MG tablet Take 1 tablet by mouth at bedtime. Swallow whole. Do NOT crush, break or chew tablets. Take with food. 03/22/16  Yes [name] Hissornelius N Van Dam, MD  dolutegravir (TIVICAY) 50 MG tablet Take 1 tablet (50 mg total) by mouth daily with breakfast. 02/09/16  Yes Gardiner Barefootobert W Comer, MD  emtricitabine-tenofovir AF (DESCOVY) 200-25 MG  tablet Take 1 tablet by mouth daily with breakfast. 02/09/16  Yes Gardiner Barefootobert W Comer, MD  emtricitabine-tenofovir AF (DESCOVY) 200-25 MG tablet Take 1 tablet by mouth at bedtime. 03/22/16  Yes [name] Hissornelius N Van Dam, MD  ondansetron (ZOFRAN) 4 MG tablet Take 1 tablet (4 mg total) by mouth daily at 8 pm. Take with all the other meds 02/09/16  Yes Gardiner Barefootobert W Comer, MD  dolutegravir (TIVICAY) 50 MG tablet Take 1 tablet (50 mg total) by mouth daily. 03/22/16   [name] Hissornelius N Van Dam, MD  megestrol (MEGACE ES) 625 MG/5ML suspension Take 5 mLs (625 mg total) by mouth daily. 02/09/16   Gardiner Barefootobert W Comer, MD  megestrol (MEGACE ES) 625 MG/5ML suspension Take 5 mLs (625 mg total) by mouth daily. 02/09/16   Gardiner Barefootobert W Comer, MD  ondansetron (ZOFRAN) 4 MG tablet Take 1 tablet (4 mg total) by mouth daily at 8 pm. Take with all the other meds 02/09/16   Gardiner Barefootobert W Comer, MD   Meds Ordered and Administered this Visit  Medications - No data to display  BP 121/77 (BP Location: Left Arm)   Pulse (!) 58   Temp 98.2 F (36.8 C) (Oral)   Resp 16   SpO2 100%  No data found.   Physical Exam  Constitutional: He appears well-developed and well-nourished.  HENT:  Head: Normocephalic and atraumatic.  Neck: Neck supple.  Cardiovascular: Normal rate.   Pulmonary/Chest: Effort normal.  Genitourinary: Penis normal.  Musculoskeletal: Normal range of motion.  Skin: Skin is warm and dry.  Nursing note and vitals reviewed.   Urgent Care Course   Clinical Course     Procedures (including critical care time)  Labs Review Labs Reviewed  RPR  URINE CYTOLOGY ANCILLARY ONLY    Imaging Review No results found.   Visual Acuity Review  Right Eye Distance:   Left Eye Distance:   Bilateral Distance:    Right Eye Near:   Left Eye Near:    Bilateral Near:         MDM   1. Screening examination for STD (sexually transmitted disease)    STD screening, cytology and RPR obtained. You will be called with results and treated  likely over the phone. Sometimes it is necessary to return for specific injection medication.     Hayden Rasmussenavid Arnell Slivinski, NP 05/07/16 Windell [id number]

## 2016-05-07 NOTE — ED Notes (Signed)
Call back number verified and updated in EPIC... Adv pt to not have SI until lab results comeback neg.... Also adv pt lab results will be on MyChart; instructions given .... Pt verb understanding.   

## 2016-05-08 LAB — RPR: RPR: NONREACTIVE

## 2016-05-09 LAB — URINE CYTOLOGY ANCILLARY ONLY
CHLAMYDIA, DNA PROBE: NEGATIVE
NEISSERIA GONORRHEA: NEGATIVE
TRICH (WINDOWPATH): NEGATIVE

## 2016-05-23 ENCOUNTER — Other Ambulatory Visit: Payer: BC Managed Care – PPO

## 2016-06-13 ENCOUNTER — Ambulatory Visit: Payer: BC Managed Care – PPO | Admitting: Internal Medicine

## 2016-07-19 ENCOUNTER — Ambulatory Visit: Payer: Self-pay

## 2016-08-24 ENCOUNTER — Telehealth: Payer: Self-pay | Admitting: *Deleted

## 2016-08-24 DIAGNOSIS — B20 Human immunodeficiency virus [HIV] disease: Secondary | ICD-10-CM

## 2016-08-24 MED ORDER — DOLUTEGRAVIR SODIUM 50 MG PO TABS: 50.0000 mg | ORAL_TABLET | Freq: Every day | ORAL | 0 refills | Status: DC

## 2016-08-24 MED ORDER — DARUNAVIR-COBICISTAT 800-150 MG PO TABS: 1.0000 | ORAL_TABLET | Freq: Every day | ORAL | 0 refills | Status: DC

## 2016-08-24 MED ORDER — EMTRICITABINE-TENOFOVIR AF 200-25 MG PO TABS: 1.0000 | ORAL_TABLET | Freq: Every day | ORAL | 0 refills | Status: DC

## 2016-08-24 NOTE — Telephone Encounter (Signed)
Patient left message in triage asking for medication. RN returned the call, left voicemail instructing him to come in ASAP for RW/ADAP renewal.  He has not renewed ADAP. He no-showed his last 2 MD appointments with Dr Luciana Axeomer. [facility] with the hopes to bridge him through the potential ADAP coverage gap. Andree CossHowell, Dastan Krider M, RN

## 2016-08-25 ENCOUNTER — Telehealth: Payer: Self-pay | Admitting: *Deleted

## 2016-08-25 NOTE — Telephone Encounter (Signed)
agree

## 2016-08-25 NOTE — Telephone Encounter (Signed)
Patient called.  He has been off medication for "many months." He stated he was out of town until April and would not be able to come in for ADAP renewal until 4/16.  Although he has ADAP coverage until 3/31, RN held medication refill until he is guaranteed coverage to actually continue to have access to medication.  Patient has had multiple no-shows, did not communicate with [facility] when he had issues accessing medication, stating "I don't have time to call once a week to figure out why I can't get meds." RN made appointment for 4/16 with Dr Luciana Axeomer.  He will need labs and will bring documentation for ADAP renewal. Patient verbalized agreement. Andree CossHowell, Kalley Nicholl M, RN

## 2016-09-19 ENCOUNTER — Encounter: Payer: Self-pay | Admitting: Internal Medicine

## 2016-09-19 ENCOUNTER — Ambulatory Visit (INDEPENDENT_AMBULATORY_CARE_PROVIDER_SITE_OTHER): Payer: Self-pay | Admitting: Internal Medicine

## 2016-09-19 VITALS — BP 115/74 | HR 69 | Temp 98.5°F | Ht 66.0 in | Wt 124.0 lb

## 2016-09-19 DIAGNOSIS — R630 Anorexia: Secondary | ICD-10-CM

## 2016-09-19 DIAGNOSIS — B20 Human immunodeficiency virus [HIV] disease: Secondary | ICD-10-CM

## 2016-09-19 DIAGNOSIS — Z23 Encounter for immunization: Secondary | ICD-10-CM

## 2016-09-19 MED ORDER — DARUNAVIR ETHANOLATE 800 MG PO TABS: 800.0000 mg | ORAL_TABLET | Freq: Every day | ORAL | 11 refills | Status: DC

## 2016-09-19 MED ORDER — DARUNAVIR ETHANOLATE 800 MG PO TABS: 800.0000 mg | ORAL_TABLET | Freq: Every day | ORAL | 3 refills | Status: DC

## 2016-09-19 MED ORDER — ONDANSETRON HCL 4 MG PO TABS: 4.0000 mg | ORAL_TABLET | Freq: Every day | ORAL | 5 refills | Status: DC

## 2016-09-19 MED ORDER — ELVITEG-COBIC-EMTRICIT-TENOFAF 150-150-200-10 MG PO TABS: 1.0000 | ORAL_TABLET | Freq: Every day | ORAL | 11 refills | Status: DC

## 2016-09-19 MED ORDER — MEGESTROL ACETATE 625 MG/5ML PO SUSP: 625.0000 mg | Freq: Every day | ORAL | 5 refills | Status: DC

## 2016-09-19 MED ORDER — ELVITEG-COBIC-EMTRICIT-TENOFAF 150-150-200-10 MG PO TABS: 1.0000 | ORAL_TABLET | Freq: Every day | ORAL | 3 refills | Status: DC

## 2016-09-19 NOTE — Assessment & Plan Note (Signed)
Can restart megace when coverage active again

## 2016-09-19 NOTE — Assessment & Plan Note (Addendum)
I discussed with him at length the need to stay on treatment, not stopping and starting and to keep up with required paperwork that assures him coverage.  I disussed the severity of untreated HIV. Will get him again medication through [facility] and recheck labs in 4 weeks.  Will use Genvoya and prezista

## 2016-09-19 NOTE — Addendum Note (Signed)
Addended by: Wendall Mola A on: 09/19/2016 11:25 AM   Modules accepted: Orders

## 2016-09-19 NOTE — Progress Notes (Signed)
   Subjective:    Patient ID: Cory Russell, male    DOB: [birth year], 26 y.o.   MRN: [id number]  HPI Here for follow up for HIV.  Unfortunately he is off of medications and really never got started other than what he got with [facility].  He is frustrated with the process and paperwork and not getting on it. He has not completed the ADAP paperwork again.  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  Has some weight loss, night sweats associated with being off of medication.  Some nausea with medication but ok with zofran.    Review of Systems  Constitutional: Negative for fatigue.  Gastrointestinal: Negative for diarrhea.  Skin: Negative for rash.  Neurological: Negative for dizziness.       Objective:   Physical Exam  Constitutional: He appears well-developed and well-nourished. No distress.  HENT:  Mouth/Throat: No oropharyngeal exudate.  Eyes: No scleral icterus.  Cardiovascular: Normal rate, regular rhythm and normal heart sounds.   Skin: No rash noted.   SH: employed at [employer]       Assessment & Plan:

## 2016-10-10 ENCOUNTER — Encounter (HOSPITAL_COMMUNITY): Payer: Self-pay | Admitting: Emergency Medicine

## 2016-10-10 ENCOUNTER — Ambulatory Visit (HOSPITAL_COMMUNITY)
Admission: EM | Admit: 2016-10-10 | Discharge: 2016-10-10 | Disposition: A | Discharge status: Home / Self Care | Payer: Self-pay | Attending: Internal Medicine | Admitting: Internal Medicine

## 2016-10-10 DIAGNOSIS — R1013 Epigastric pain: Secondary | ICD-10-CM

## 2016-10-10 DIAGNOSIS — R1012 Left upper quadrant pain: Secondary | ICD-10-CM

## 2016-10-10 LAB — POCT I-STAT, CHEM 8
BUN: 18 mg/dL (ref 6–20)
CALCIUM ION: 1.23 mmol/L (ref 1.15–1.40)
Chloride: 104 mmol/L (ref 101–111)
Creatinine, Ser: 1.1 mg/dL (ref 0.61–1.24)
Glucose, Bld: 89 mg/dL (ref 65–99)
HEMATOCRIT: 39 % (ref 39.0–52.0)
HEMOGLOBIN: 13.3 g/dL (ref 13.0–17.0)
Potassium: 4.2 mmol/L (ref 3.5–5.1)
Sodium: 141 mmol/L (ref 135–145)
TCO2: 28 mmol/L (ref 0–100)

## 2016-10-10 MED ORDER — OMEPRAZOLE 40 MG PO CPDR: 40.0000 mg | DELAYED_RELEASE_CAPSULE | Freq: Two times a day (BID) | ORAL | 0 refills | Status: DC

## 2016-10-10 MED ORDER — FAMOTIDINE 40 MG PO TABS: 40.0000 mg | ORAL_TABLET | Freq: Two times a day (BID) | ORAL | 0 refills | Status: DC

## 2016-10-10 NOTE — Discharge Instructions (Addendum)
Symptoms today seem most consistent with a stomach acid problem.  Prescriptions for famotidine and for [facility]eprazole [facility] [address].  Could try famotidine first and then add or substitute omeprazole if needed in a few days.  Might need to avoid taking Prezista and Genvoya at the same time as omeprazole, pharmacist will be able to guide you on this.  Anticipate gradual improvement over the next few days; recheck or go to ED if vomiting blood/coffee ground material, or for severe/persistent pain.

## 2016-10-10 NOTE — ED Triage Notes (Signed)
Here for constant LUQ pain onset 2 days associated w/bloody stools this am  Pain increases w/deep breaths and activity  Denies fevers, v/n/d, constipation  A&O x4... NAD  Needing note for work

## 2016-10-10 NOTE — ED Provider Notes (Signed)
[facility]    CSN: [id number] Arrival date & time: 10/10/16  1115     History   Chief Complaint Chief Complaint  Patient presents with  . Abdominal Pain    HPI Cory Russell is a 26 y.o. male. He presents today with about a week's history of discomfort in the left upper quadrant and epigastrium. This felt a little bloated to him, thought maybe it was gas. Aggravated by movement of the torso, more noticeable with deep breaths. No change in appetite. No nausea/vomiting. No change in bowel habits, has sometimes had some trouble with constipation. Tends to have a bowel movement twice a day. Stools are sometimes hard, not usually large, not usually painful. No urinary discomfort, no frequency. No fever. Did have some blood in the bowl this morning, no external mass or pain as with previous hemorrhoids.    HPI  Past Medical History:  Diagnosis Date  . Diabetes mellitus without complication (HCC)   . HIV (human immunodeficiency virus infection) (HCC)   . Hypertension     Patient Active Problem List   Diagnosis Date Noted  . Depression 01/26/2015  . Encounter for long-term (current) use of medications 08/28/2014  . Screening examination for venereal disease 08/28/2014  . Poor appetite 08/28/2014  . Human immunodeficiency virus (HIV) disease (HCC) 12/20/2013    History reviewed. No pertinent surgical history.     Home Medications    Prior to Admission medications   Medication Sig Start Date End Date Taking? Authorizing Provider  darunavir (PREZISTA) 800 MG tablet Take 1 tablet (800 mg total) by mouth daily. 09/19/16   ComerBelia Heman, Robert W, MD  elvitegravir-cobicistat-emtricitabine-tenofovir (GENVOYA) 150-150-200-10 MG TABS tablet Take 1 tablet by mouth daily. 09/19/16   Gardiner Barefootomer, Robert W, MD  famotidine (PEPCID) 40 MG tablet Take 1 tablet (40 mg total) by mouth 2 (two) times daily. 10/10/16 10/24/16  Eustace MooreMurray, Kanyia Heaslip W, MD  megestrol (MEGACE ES) 625 MG/5ML suspension Take 5 mLs  (625 mg total) by mouth daily. 09/19/16   Gardiner Barefootomer, Robert W, MD  omeprazole (PRILOSEC) 40 MG capsule Take 1 capsule (40 mg total) by mouth 2 (two) times daily before a meal. 10/10/16 10/24/16  Eustace MooreMurray, Jarryd Gratz W, MD  ondansetron (ZOFRAN) 4 MG tablet Take 1 tablet (4 mg total) by mouth daily at 8 pm. 09/19/16   Comer, Belia Hemanobert W, MD    Family History Family History  Problem Relation Age of Onset  . Diabetes Mother   . Hypertension Father     Social History Social History  Substance Use Topics  . Smoking status: Never Smoker  . Smokeless tobacco: Never Used  . Alcohol use 0.0 oz/week     Comment: occasional     Allergies   Patient has no known allergies.   Review of Systems Review of Systems  All other systems reviewed and are negative.    Physical Exam Triage Vital Signs ED Triage Vitals  Enc Vitals Group     BP 10/10/16 1148 103/63     Pulse Rate 10/10/16 1148 84     Resp 10/10/16 1148 20     Temp 10/10/16 1148 98.7 F (37.1 C)     Temp Source 10/10/16 1148 Oral     SpO2 10/10/16 1148 100 %     Weight --      Height --      Pain Score 10/10/16 1147 7     Pain Loc --    No data found.   Updated Vital Signs  BP 103/63 (BP Location: Right Arm)   Pulse 84   Temp 98.7 F (37.1 C) (Oral)   Resp 20   SpO2 100%   Physical Exam  Constitutional: He is oriented to person, place, and time. No distress.  Alert, nicely groomed  HENT:  Head: Atraumatic.  Eyes:  Conjugate gaze, no eye redness/drainage  Neck: Neck supple.  Cardiovascular: Normal rate and regular rhythm.   Pulmonary/Chest: No respiratory distress. He has no wheezes. He has no rales.  Lungs clear, symmetric breath sounds  Abdominal: Soft. He exhibits no distension. There is no rebound and no guarding.  Mild epigastric/left upper quadrant tenderness to deep palpation  Musculoskeletal: Normal range of motion.  Neurological: He is alert and oriented to person, place, and time.  Skin: Skin is warm and dry.  No  cyanosis  Nursing note and vitals reviewed.    UC Treatments / Results  Labs Results for orders placed or performed during the hospital encounter of 10/10/16  I-STAT, chem 8  Result Value Ref Range   Sodium 141 135 - 145 mmol/L   Potassium 4.2 3.5 - 5.1 mmol/L   Chloride 104 101 - 111 mmol/L   BUN 18 6 - 20 mg/dL   Creatinine, Ser 1.61 0.61 - 1.24 mg/dL   Glucose, Bld 89 65 - 99 mg/dL   Calcium, Ion 0.96 0.45 - 1.40 mmol/L   TCO2 28 0 - 100 mmol/L   Hemoglobin 13.3 13.0 - 17.0 g/dL   HCT 40.9 81.1 - 91.4 %    Procedures Procedures (including critical care time) None today  Final Clinical Impressions(s) / UC Diagnoses   Final diagnoses:  Acute epigastric pain   Symptoms today seem most consistent with a stomach acid problem.  Prescriptions for famotidine and for omeprazole were sent to the [facility] on [address].  Could try famotidine first and then add or substitute omeprazole if needed in a few days.  Might need to avoid taking Prezista and Genvoya at the same time as omeprazole, pharmacist will be able to guide you on this.  Anticipate gradual improvement over the next few days; recheck or go to ED if vomiting blood/coffee ground material, or for severe/persistent pain.    New Prescriptions Discharge Medication List as of 10/10/2016  1:40 PM    START taking these medications   Details  famotidine (PEPCID) 40 MG tablet Take 1 tablet (40 mg total) by mouth 2 (two) times daily., Starting Mon 10/10/2016, Until Mon 10/24/2016, Normal    omeprazole (PRILOSEC) 40 MG capsule Take 1 capsule (40 mg total) by mouth 2 (two) times daily before a meal., Starting Mon 10/10/2016, Until Mon 10/24/2016, Normal         Eustace Moore, MD 10/11/16 2238

## 2016-10-12 ENCOUNTER — Ambulatory Visit (HOSPITAL_COMMUNITY)
Admission: EM | Admit: 2016-10-12 | Discharge: 2016-10-12 | Disposition: A | Discharge status: Other Acute Inpatient Hospital | Payer: Self-pay

## 2016-10-12 ENCOUNTER — Ambulatory Visit (INDEPENDENT_AMBULATORY_CARE_PROVIDER_SITE_OTHER): Payer: Self-pay | Admitting: Internal Medicine

## 2016-10-12 ENCOUNTER — Encounter: Payer: Self-pay | Admitting: Internal Medicine

## 2016-10-12 VITALS — BP 112/73 | HR 83 | Temp 98.4°F | Ht 67.0 in | Wt 121.0 lb

## 2016-10-12 DIAGNOSIS — B20 Human immunodeficiency virus [HIV] disease: Secondary | ICD-10-CM

## 2016-10-12 DIAGNOSIS — R21 Rash and other nonspecific skin eruption: Secondary | ICD-10-CM

## 2016-10-12 DIAGNOSIS — Z113 Encounter for screening for infections with a predominantly sexual mode of transmission: Secondary | ICD-10-CM

## 2016-10-12 MED ORDER — OMEPRAZOLE 40 MG PO CPDR: 40.0000 mg | DELAYED_RELEASE_CAPSULE | Freq: Every day | ORAL | 1 refills | Status: DC

## 2016-10-12 NOTE — Progress Notes (Signed)
   Subjective:    Patient ID: Cory Russell, male    DOB: [birth year], 26 y.o.   MRN: [id number]  HPI Here for follow up of HIV> Has a history of poor compliance and I saw him last month again off of medications.  He has issues with renewing ADAP and started back on Genvoya with prezista.  He has a known 184V mutation.  Is currently going to start with [employer] and finishing the application.   He came in with a complaint of a rash all over.  He went to urgent care 2 days ago with abdominal pain with bloating and felt to be ulcer and started on famotidine.  He was also supposed to start omeprazole but did not receive it.  After starting that, he got a rash all over.  Is pruritic.  On arms, torso, GU.  Stopped the famotidine.  Also with some recent sexual encounters.   Apparently had some blood in the stool.     Review of Systems  Constitutional: Negative for chills and fever.  Gastrointestinal: Positive for abdominal distention. Negative for diarrhea and nausea.  Skin: Positive for rash.       Objective:   Physical Exam  Constitutional: He appears well-developed and well-nourished. No distress.  HENT:  Mouth/Throat: No oropharyngeal exudate.  Eyes: No scleral icterus.  Cardiovascular: Normal rate, regular rhythm and normal heart sounds.   Pulmonary/Chest: Effort normal and breath sounds normal. No respiratory distress.  Lymphadenopathy:    He has no cervical adenopathy.  Skin: Rash noted.   SH: no tobacco       Assessment & Plan:

## 2016-10-12 NOTE — Assessment & Plan Note (Signed)
May be famotidine reaction  Advised it may last 1-2 weeks

## 2016-10-12 NOTE — Assessment & Plan Note (Signed)
Will push back his appt to 2 months since he has not yet started and hopefully will be on Genvoya and prezista by then.

## 2016-10-12 NOTE — Assessment & Plan Note (Signed)
Will check an RPR to be sure not syphilis.

## 2016-10-13 LAB — RPR: RPR Ser Ql: REACTIVE — AB

## 2016-10-13 LAB — RPR TITER: RPR Titer: 1:32 {titer} — AB

## 2016-10-13 LAB — FLUORESCENT TREPONEMAL AB(FTA)-IGG-BLD: Fluorescent Treponemal ABS: REACTIVE — AB

## 2016-10-14 ENCOUNTER — Telehealth: Payer: Self-pay | Admitting: *Deleted

## 2016-10-14 ENCOUNTER — Ambulatory Visit (INDEPENDENT_AMBULATORY_CARE_PROVIDER_SITE_OTHER): Payer: Self-pay | Admitting: Internal Medicine

## 2016-10-14 VITALS — BP 108/66 | HR 79 | Temp 98.4°F | Ht 67.0 in | Wt 120.0 lb

## 2016-10-14 DIAGNOSIS — A539 Syphilis, unspecified: Secondary | ICD-10-CM

## 2016-10-14 MED ORDER — PENICILLIN G BENZATHINE 1200000 UNIT/2ML IM SUSP
2.4000 10*6.[IU] | Freq: Once | INTRAMUSCULAR | Status: AC
  Administered 2016-10-14: 2.4 10*6.[IU] via INTRAMUSCULAR

## 2016-10-14 NOTE — Progress Notes (Signed)
Pt came in to receive treatment for syphilis

## 2016-10-14 NOTE — Telephone Encounter (Signed)
Patient informed and he is coming in at 11:30 AM for treatment. He was not reactive when RPR was last checked. Wendall MolaJacqueline Cockerham

## 2016-10-14 NOTE — Telephone Encounter (Signed)
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 He needs one bicillin shot unless his titer was higher recently. I don't have access to his history on my phone.

## 2016-10-17 ENCOUNTER — Other Ambulatory Visit: Payer: Self-pay

## 2016-10-24 ENCOUNTER — Ambulatory Visit: Payer: Self-pay | Admitting: Internal Medicine

## 2016-12-14 ENCOUNTER — Ambulatory Visit: Payer: Self-pay

## 2016-12-16 ENCOUNTER — Encounter: Payer: Self-pay | Admitting: Internal Medicine

## 2017-01-04 ENCOUNTER — Ambulatory Visit: Payer: Self-pay | Admitting: Internal Medicine

## 2017-01-06 ENCOUNTER — Other Ambulatory Visit: Payer: Self-pay | Admitting: *Deleted

## 2017-01-06 DIAGNOSIS — R630 Anorexia: Secondary | ICD-10-CM

## 2017-01-06 MED ORDER — DARUNAVIR ETHANOLATE 800 MG PO TABS: 800.0000 mg | ORAL_TABLET | Freq: Every day | ORAL | 5 refills | Status: DC

## 2017-01-06 MED ORDER — ELVITEG-COBIC-EMTRICIT-TENOFAF 150-150-200-10 MG PO TABS: 1.0000 | ORAL_TABLET | Freq: Every day | ORAL | 5 refills | Status: DC

## 2017-01-06 MED ORDER — MEGESTROL ACETATE 625 MG/5ML PO SUSP: 625.0000 mg | Freq: Every day | ORAL | 5 refills | Status: DC

## 2017-01-26 ENCOUNTER — Ambulatory Visit: Payer: Self-pay

## 2017-02-03 ENCOUNTER — Other Ambulatory Visit: Payer: Self-pay | Admitting: *Deleted

## 2017-02-03 ENCOUNTER — Telehealth: Payer: Self-pay | Admitting: Internal Medicine

## 2017-02-03 MED ORDER — ELVITEG-COBIC-EMTRICIT-TENOFAF 150-150-200-10 MG PO TABS: 1.0000 | ORAL_TABLET | Freq: Every day | ORAL | 5 refills | Status: DC

## 2017-02-03 MED ORDER — DARUNAVIR ETHANOLATE 800 MG PO TABS: 800.0000 mg | ORAL_TABLET | Freq: Every day | ORAL | 5 refills | Status: DC

## 2017-02-03 NOTE — Telephone Encounter (Signed)
Patient says that [facility] does not have his prescriptions.  He said that [facility] has them for some reason but he is on the Adap program.

## 2017-02-03 NOTE — Telephone Encounter (Signed)
I do not see that [facility] was [facility]. [facility] Wendall MolaJacqueline Cockerham

## 2017-02-21 ENCOUNTER — Ambulatory Visit: Payer: Self-pay | Admitting: Internal Medicine

## 2017-03-16 ENCOUNTER — Ambulatory Visit (INDEPENDENT_AMBULATORY_CARE_PROVIDER_SITE_OTHER): Payer: Self-pay | Admitting: Internal Medicine

## 2017-03-16 ENCOUNTER — Encounter: Payer: Self-pay | Admitting: Internal Medicine

## 2017-03-16 VITALS — BP 100/63 | HR 64 | Temp 98.1°F | Wt 112.0 lb

## 2017-03-16 DIAGNOSIS — B20 Human immunodeficiency virus [HIV] disease: Secondary | ICD-10-CM

## 2017-03-16 DIAGNOSIS — Z7189 Other specified counseling: Secondary | ICD-10-CM

## 2017-03-16 DIAGNOSIS — Z7185 Encounter for immunization safety counseling: Secondary | ICD-10-CM

## 2017-03-16 DIAGNOSIS — Z23 Encounter for immunization: Secondary | ICD-10-CM | POA: Insufficient documentation

## 2017-03-16 DIAGNOSIS — R630 Anorexia: Secondary | ICD-10-CM

## 2017-03-16 MED ORDER — DARUNAVIR ETHANOLATE 800 MG PO TABS: 800.0000 mg | ORAL_TABLET | Freq: Every day | ORAL | 5 refills | Status: DC

## 2017-03-16 MED ORDER — MEGESTROL ACETATE 625 MG/5ML PO SUSP: 625.0000 mg | Freq: Every day | ORAL | 5 refills | Status: DC

## 2017-03-16 MED ORDER — ELVITEG-COBIC-EMTRICIT-TENOFAF 150-150-200-10 MG PO TABS: 1.0000 | ORAL_TABLET | Freq: Every day | ORAL | 5 refills | Status: DC

## 2017-03-16 NOTE — Progress Notes (Signed)
   Subjective:    Patient ID: Cory Russell, male    DOB: 30-Jun-1990, 26 y.o.   MRN: [id number]  HPI Here for follow up of HIV Has again been off of medications for about 6 months. Has also lost about 25 lbs.  Tells me he could not get the medication from the pharmacy though his ADAP has been approved.  No associated n/v/d.  No rashes.  Poor appetite and asking for megace again.     Review of Systems  Constitutional: Negative for fatigue.  Gastrointestinal: Negative for diarrhea and nausea.  Neurological: Negative for dizziness.       Objective:   Physical Exam  Constitutional: He appears well-developed. No distress.  Thin appearing  HENT:  Mouth/Throat: No oropharyngeal exudate.  Eyes: No scleral icterus.  Cardiovascular: Normal rate, regular rhythm and normal heart sounds.   Pulmonary/Chest: Effort normal and breath sounds normal. No respiratory distress.  Abdominal: Soft. Bowel sounds are normal.  Lymphadenopathy:    He has no cervical adenopathy.  Skin: No rash noted.   SH: no tobacco       Assessment & Plan:

## 2017-03-16 NOTE — Assessment & Plan Note (Signed)
I resent his medication and he will pick up now.  rtc 3 weeks and I will do labs then

## 2017-03-16 NOTE — Assessment & Plan Note (Signed)
Counseled on flu shot and given 

## 2017-03-16 NOTE — Assessment & Plan Note (Signed)
With weight loss.  I will resend megace

## 2017-04-25 ENCOUNTER — Encounter: Payer: Self-pay | Admitting: Internal Medicine

## 2017-04-25 ENCOUNTER — Ambulatory Visit (INDEPENDENT_AMBULATORY_CARE_PROVIDER_SITE_OTHER): Payer: Self-pay | Admitting: Internal Medicine

## 2017-04-25 VITALS — BP 125/86 | HR 84 | Temp 98.0°F | Wt 123.0 lb

## 2017-04-25 DIAGNOSIS — Z113 Encounter for screening for infections with a predominantly sexual mode of transmission: Secondary | ICD-10-CM

## 2017-04-25 DIAGNOSIS — R630 Anorexia: Secondary | ICD-10-CM

## 2017-04-25 DIAGNOSIS — B20 Human immunodeficiency virus [HIV] disease: Secondary | ICD-10-CM

## 2017-04-25 NOTE — Assessment & Plan Note (Signed)
Will check labs today and rtc 2 months to recheck again then.  Counseled on compliance

## 2017-04-25 NOTE — Assessment & Plan Note (Signed)
Improved with megace and gained weight.  Will continue to monitor

## 2017-04-25 NOTE — Assessment & Plan Note (Signed)
Will recheck his rpr with history of syphilis

## 2017-04-25 NOTE — Progress Notes (Signed)
   Subjective:    Patient ID: Cory Russell, male    DOB: [birth year], 26 y.o.   MRN: [id number]  HPI Here for follow up of HIV He was off medication last visit after letting ADAP lapse.  Started back on Genvoya and Prezista and tolerating well.  Has gained some weight back.  Some fatigue and mood swings but better in the last week.  No associated diarrhea, rash. Last CD4 460.     Review of Systems  Constitutional: Negative for fatigue.  Gastrointestinal: Negative for diarrhea and nausea.  Skin: Negative for rash.       Objective:   Physical Exam  Constitutional: He appears well-developed and well-nourished. No distress.  Eyes: No scleral icterus.  Cardiovascular: Normal rate, regular rhythm and normal heart sounds.  No murmur heard. Pulmonary/Chest: Effort normal and breath sounds normal. No respiratory distress.  Lymphadenopathy:    He has no cervical adenopathy.  Skin: No rash noted.   SH: no tobacco       Assessment & Plan:

## 2017-04-26 LAB — COMPLETE METABOLIC PANEL WITH GFR
AG RATIO: 1.4 (calc) (ref 1.0–2.5)
ALT: 22 U/L (ref 9–46)
AST: 24 U/L (ref 10–40)
Albumin: 4.4 g/dL (ref 3.6–5.1)
Alkaline phosphatase (APISO): 38 U/L — ABNORMAL LOW (ref 40–115)
BUN: 13 mg/dL (ref 7–25)
CALCIUM: 9.9 mg/dL (ref 8.6–10.3)
CHLORIDE: 101 mmol/L (ref 98–110)
CO2: 26 mmol/L (ref 20–32)
Creat: 0.89 mg/dL (ref 0.60–1.35)
GFR, EST NON AFRICAN AMERICAN: 117 mL/min/{1.73_m2} (ref >=60)
GFR, Est African American: 136 mL/min/{1.73_m2} (ref >=60)
GLUCOSE: 88 mg/dL (ref 65–99)
Globulin: 3.2 g/dL (calc) (ref 1.9–3.7)
POTASSIUM: 4.2 mmol/L (ref 3.5–5.3)
Sodium: 135 mmol/L (ref 135–146)
Total Bilirubin: 0.5 mg/dL (ref 0.2–1.2)
Total Protein: 7.6 g/dL (ref 6.1–8.1)

## 2017-04-26 LAB — CBC WITH DIFFERENTIAL/PLATELET
BASOS ABS: 18 {cells}/uL (ref 0–200)
Basophils Relative: 0.3 %
EOS ABS: 18 {cells}/uL (ref 15–500)
Eosinophils Relative: 0.3 %
HCT: 38.6 % (ref 38.5–50.0)
Hemoglobin: 13.2 g/dL (ref 13.2–17.1)
Lymphs Abs: 2885 cells/uL (ref 850–3900)
MCH: 29 pg (ref 27.0–33.0)
MCHC: 34.2 g/dL (ref 32.0–36.0)
MCV: 84.8 fL (ref 80.0–100.0)
MONOS PCT: 10.8 %
MPV: 8.2 fL (ref 7.5–12.5)
NEUTROS PCT: 41.3 %
Neutro Abs: 2519 cells/uL (ref 1500–7800)
PLATELETS: 248 10*3/uL (ref 140–400)
RBC: 4.55 10*6/uL (ref 4.20–5.80)
RDW: 15.4 % — AB (ref 11.0–15.0)
TOTAL LYMPHOCYTE: 47.3 %
WBC: 6.1 10*3/uL (ref 3.8–10.8)
WBCMIX: 659 {cells}/uL (ref 200–950)

## 2017-04-26 LAB — T-HELPER CELL (CD4) - (RCID CLINIC ONLY)
CD4 T CELL HELPER: 17 % — AB (ref 33–55)
CD4 T Cell Abs: 520 /uL (ref 400–2700)

## 2017-04-26 LAB — RPR: RPR: REACTIVE — AB

## 2017-04-26 LAB — FLUORESCENT TREPONEMAL AB(FTA)-IGG-BLD: Fluorescent Treponemal ABS: REACTIVE — AB

## 2017-04-26 LAB — RPR TITER

## 2017-05-01 LAB — HIV-1 RNA QUANT-NO REFLEX-BLD
HIV 1 RNA QUANT: 260 {copies}/mL — AB
HIV-1 RNA QUANT, LOG: 2.41 {Log_copies}/mL — AB

## 2017-06-20 ENCOUNTER — Ambulatory Visit: Payer: Self-pay | Admitting: Internal Medicine

## 2017-07-06 ENCOUNTER — Telehealth: Payer: Self-pay | Admitting: *Deleted

## 2017-07-06 NOTE — Telephone Encounter (Signed)
[facility]. Patient had a visit with them on 1/29 and tested positive RPR, titer = 1:64. He presented with rash on his chest, torso, hands, arms. He was given doxycycline 100 mg twice daily x 28 days. [facility]. RN gave Cloria Springarissa our fax number for her to fax the notes, notified [facility]bi [facility]. Patient missed his appointment 1/15, but rescheduled 1/28 for follow up on 2/28. Andree CossHowell, Rex Magee M, RN

## 2017-08-03 ENCOUNTER — Ambulatory Visit: Payer: Self-pay | Admitting: Internal Medicine

## 2017-10-03 ENCOUNTER — Other Ambulatory Visit: Payer: Self-pay | Admitting: *Deleted

## 2017-10-03 ENCOUNTER — Other Ambulatory Visit: Payer: Self-pay

## 2017-10-03 DIAGNOSIS — B20 Human immunodeficiency virus [HIV] disease: Secondary | ICD-10-CM

## 2017-10-03 LAB — CBC WITH DIFFERENTIAL/PLATELET
BASOS PCT: 0.4 %
Basophils Absolute: 19 cells/uL (ref 0–200)
EOS ABS: 29 {cells}/uL (ref 15–500)
EOS PCT: 0.6 %
HCT: 40.3 % (ref 38.5–50.0)
HEMOGLOBIN: 13.4 g/dL (ref 13.2–17.1)
Lymphs Abs: 2395 cells/uL (ref 850–3900)
MCH: 29.5 pg (ref 27.0–33.0)
MCHC: 33.3 g/dL (ref 32.0–36.0)
MCV: 88.6 fL (ref 80.0–100.0)
MONOS PCT: 9.4 %
MPV: 9.2 fL (ref 7.5–12.5)
NEUTROS ABS: 1906 {cells}/uL (ref 1500–7800)
Neutrophils Relative %: 39.7 %
PLATELETS: 222 10*3/uL (ref 140–400)
RBC: 4.55 10*6/uL (ref 4.20–5.80)
RDW: 13.6 % (ref 11.0–15.0)
TOTAL LYMPHOCYTE: 49.9 %
WBC mixed population: 451 cells/uL (ref 200–950)
WBC: 4.8 10*3/uL (ref 3.8–10.8)

## 2017-10-03 LAB — COMPLETE METABOLIC PANEL WITH GFR
AG RATIO: 1.4 (calc) (ref 1.0–2.5)
ALT: 18 U/L (ref 9–46)
AST: 24 U/L (ref 10–40)
Albumin: 4.5 g/dL (ref 3.6–5.1)
Alkaline phosphatase (APISO): 58 U/L (ref 40–115)
BILIRUBIN TOTAL: 0.6 mg/dL (ref 0.2–1.2)
BUN: 15 mg/dL (ref 7–25)
CHLORIDE: 104 mmol/L (ref 98–110)
CO2: 28 mmol/L (ref 20–32)
Calcium: 9.8 mg/dL (ref 8.6–10.3)
Creat: 1.14 mg/dL (ref 0.60–1.35)
GFR, EST NON AFRICAN AMERICAN: 88 mL/min/{1.73_m2} (ref >=60)
GFR, Est African American: 102 mL/min/{1.73_m2} (ref >=60)
GLOBULIN: 3.3 g/dL (ref 1.9–3.7)
Glucose, Bld: 86 mg/dL (ref 65–99)
POTASSIUM: 4.4 mmol/L (ref 3.5–5.3)
SODIUM: 139 mmol/L (ref 135–146)
Total Protein: 7.8 g/dL (ref 6.1–8.1)

## 2017-10-04 LAB — T-HELPER CELL (CD4) - (RCID CLINIC ONLY)
CD4 % Helper T Cell: 19 % — ABNORMAL LOW (ref 33–55)
CD4 T Cell Abs: 470 /uL (ref 400–2700)

## 2017-10-05 LAB — HIV-1 RNA QUANT-NO REFLEX-BLD
HIV 1 RNA QUANT: 70400 {copies}/mL — AB
HIV-1 RNA QUANT, LOG: 4.85 {Log_copies}/mL — AB

## 2017-10-17 ENCOUNTER — Ambulatory Visit: Payer: Self-pay | Admitting: Internal Medicine

## 2017-11-02 ENCOUNTER — Ambulatory Visit: Payer: Self-pay | Admitting: Internal Medicine

## 2018-06-08 ENCOUNTER — Other Ambulatory Visit: Payer: Self-pay

## 2018-06-08 ENCOUNTER — Other Ambulatory Visit: Payer: Self-pay | Admitting: *Deleted

## 2018-06-08 ENCOUNTER — Ambulatory Visit: Payer: Self-pay

## 2018-06-08 DIAGNOSIS — B20 Human immunodeficiency virus [HIV] disease: Secondary | ICD-10-CM

## 2018-06-08 LAB — T-HELPER CELL (CD4) - (RCID CLINIC ONLY)
CD4 % Helper T Cell: 14 % — ABNORMAL LOW (ref 33–55)
CD4 T Cell Abs: 340 /uL — ABNORMAL LOW (ref 400–2700)

## 2018-06-12 LAB — COMPLETE METABOLIC PANEL WITH GFR
AG RATIO: 1.3 (calc) (ref 1.0–2.5)
ALT: 36 U/L (ref 9–46)
AST: 29 U/L (ref 10–40)
Albumin: 4.6 g/dL (ref 3.6–5.1)
Alkaline phosphatase (APISO): 69 U/L (ref 40–115)
BUN: 15 mg/dL (ref 7–25)
CHLORIDE: 103 mmol/L (ref 98–110)
CO2: 28 mmol/L (ref 20–32)
Calcium: 9.7 mg/dL (ref 8.6–10.3)
Creat: 1.08 mg/dL (ref 0.60–1.35)
GFR, Est African American: 108 mL/min/{1.73_m2} (ref >=60)
GFR, Est Non African American: 93 mL/min/{1.73_m2} (ref >=60)
GLOBULIN: 3.5 g/dL (ref 1.9–3.7)
Glucose, Bld: 92 mg/dL (ref 65–99)
POTASSIUM: 4.1 mmol/L (ref 3.5–5.3)
Sodium: 138 mmol/L (ref 135–146)
Total Bilirubin: 0.5 mg/dL (ref 0.2–1.2)
Total Protein: 8.1 g/dL (ref 6.1–8.1)

## 2018-06-12 LAB — CBC WITH DIFFERENTIAL/PLATELET
Absolute Monocytes: 572 cells/uL (ref 200–950)
Basophils Absolute: 12 cells/uL (ref 0–200)
Basophils Relative: 0.3 %
Eosinophils Absolute: 12 cells/uL — ABNORMAL LOW (ref 15–500)
Eosinophils Relative: 0.3 %
HEMATOCRIT: 40.3 % (ref 38.5–50.0)
Hemoglobin: 13.4 g/dL (ref 13.2–17.1)
Lymphs Abs: 2360 cells/uL (ref 850–3900)
MCH: 29.1 pg (ref 27.0–33.0)
MCHC: 33.3 g/dL (ref 32.0–36.0)
MCV: 87.4 fL (ref 80.0–100.0)
MPV: 9.4 fL (ref 7.5–12.5)
Monocytes Relative: 14.3 %
Neutro Abs: 1044 cells/uL — ABNORMAL LOW (ref 1500–7800)
Neutrophils Relative %: 26.1 %
Platelets: 217 10*3/uL (ref 140–400)
RBC: 4.61 10*6/uL (ref 4.20–5.80)
RDW: 14 % (ref 11.0–15.0)
Total Lymphocyte: 59 %
WBC: 4 10*3/uL (ref 3.8–10.8)

## 2018-06-12 LAB — FLUORESCENT TREPONEMAL AB(FTA)-IGG-BLD: Fluorescent Treponemal ABS: REACTIVE — AB

## 2018-06-12 LAB — HIV-1 RNA QUANT-NO REFLEX-BLD
HIV 1 RNA Quant: 168000 copies/mL — ABNORMAL HIGH
HIV-1 RNA Quant, Log: 5.23 Log copies/mL — ABNORMAL HIGH

## 2018-06-12 LAB — RPR: RPR Ser Ql: REACTIVE — AB

## 2018-06-12 LAB — RPR TITER: RPR Titer: 1:8 {titer} — ABNORMAL HIGH

## 2018-06-18 ENCOUNTER — Encounter: Payer: Self-pay | Admitting: Family

## 2018-06-22 ENCOUNTER — Telehealth: Payer: Self-pay | Admitting: Pharmacist

## 2018-06-22 NOTE — Telephone Encounter (Signed)
error 

## 2018-06-25 ENCOUNTER — Ambulatory Visit (INDEPENDENT_AMBULATORY_CARE_PROVIDER_SITE_OTHER): Payer: Self-pay | Admitting: Pharmacist

## 2018-06-25 ENCOUNTER — Ambulatory Visit (INDEPENDENT_AMBULATORY_CARE_PROVIDER_SITE_OTHER): Payer: Self-pay | Admitting: Family

## 2018-06-25 ENCOUNTER — Encounter: Payer: Self-pay | Admitting: Family

## 2018-06-25 VITALS — BP 127/82 | HR 64 | Temp 98.1°F | Wt 132.0 lb

## 2018-06-25 DIAGNOSIS — B20 Human immunodeficiency virus [HIV] disease: Secondary | ICD-10-CM

## 2018-06-25 DIAGNOSIS — F324 Major depressive disorder, single episode, in partial remission: Secondary | ICD-10-CM

## 2018-06-25 DIAGNOSIS — Z23 Encounter for immunization: Secondary | ICD-10-CM

## 2018-06-25 DIAGNOSIS — Z0000 Encounter for general adult medical examination without abnormal findings: Secondary | ICD-10-CM

## 2018-06-25 MED ORDER — DOLUTEGRAVIR-RILPIVIRINE 50-25 MG PO TABS: 1.0000 | ORAL_TABLET | Freq: Every day | ORAL | 2 refills | Status: DC

## 2018-06-25 MED ORDER — DARUNAVIR-COBICISTAT 800-150 MG PO TABS: 1.0000 | ORAL_TABLET | Freq: Every day | ORAL | 2 refills | Status: DC

## 2018-06-25 NOTE — Assessment & Plan Note (Signed)
Cory Russell has poorly controlled HIV disease due to poor medication adherence over the past 1+ year. He has no signs/symptoms of opportunistic infection or progressive HIV disease at present. He does have significant mutations leading to multiple medication resistance including his most recent regimen of Genvoya and Prezista. Will obtain new gentotype with integrase inhibitors. Discussed importance of taking medication to reduce risk of progression of HIV disease associated with inflammation. ADAP remains pending at present and pharmacy staff was able to obtain medication from assistance program. Will start him on Juluca and Prezcobix pending results of genotype. Follow up in 1 month or sooner if needed to recheck viral load and progress.

## 2018-06-25 NOTE — Progress Notes (Signed)
HPI: Cory Russell is a 28 y.o. male presenting to [facility] for an appointment with Tammy Sours.  Patient Active Problem List   Diagnosis Date Noted  . Healthcare maintenance 06/25/2018  . Vaccine counseling 03/16/2017  . Depression 01/26/2015  . Encounter for long-term (current) use of medications 08/28/2014  . Poor appetite 08/28/2014  . Human immunodeficiency virus (HIV) disease (HCC) 12/20/2013    Patient's Medications  New Prescriptions   DARUNAVIR-COBICISTAT (PREZCOBIX) 800-150 MG TABLET    Take 1 tablet by mouth daily. Swallow whole. Do NOT crush, break or chew tablets. Take with food.   DOLUTEGRAVIR-RILPIVIRINE (JULUCA) 50-25 MG TABS    Take 1 tablet by mouth daily.  Previous Medications   ONDANSETRON (ZOFRAN) 4 MG TABLET    Take 1 tablet (4 mg total) by mouth daily at 8 pm.  Modified Medications   No medications on file  Discontinued Medications   No medications on file    Allergies: No Known Allergies  Past Medical History: Past Medical History:  Diagnosis Date  . Diabetes mellitus without complication (HCC)   . HIV (human immunodeficiency virus infection) (HCC)   . Hypertension     Social History: Social History   Socioeconomic History  . Marital status: Single    Spouse name: Not on file  . Number of children: Not on file  . Years of education: Not on file  . Highest education level: Not on file  Occupational History  . Not on file  Social Needs  . Financial resource strain: Not on file  . Food insecurity:    Worry: Not on file    Inability: Not on file  . Transportation needs:    Medical: Not on file    Non-medical: Not on file  Tobacco Use  . Smoking status: Never Smoker  . Smokeless tobacco: Never Used  Substance and Sexual Activity  . Alcohol use: Yes    Alcohol/week: 0.0 standard drinks    Comment: occasional  . Drug use: Yes    Types: Marijuana  . Sexual activity: Yes    Partners: Male    Comment: declined condoms  Lifestyle  . Physical  activity:    Days per week: Not on file    Minutes per session: Not on file  . Stress: Not on file  Relationships  . Social connections:    Talks on phone: Not on file    Gets together: Not on file    Attends religious service: Not on file    Active member of club or organization: Not on file    Attends meetings of clubs or organizations: Not on file    Relationship status: Not on file  Other Topics Concern  . Not on file  Social History Narrative  . Not on file    Labs: Lab Results  Component Value Date   HIV1RNAQUANT 168,000 (H) 06/08/2018   HIV1RNAQUANT 70,400 (H) 10/03/2017   HIV1RNAQUANT 260 (H) 04/25/2017   CD4TABS 340 (L) 06/08/2018   CD4TABS 470 10/03/2017   CD4TABS 520 04/25/2017    RPR and STI Lab Results  Component Value Date   LABRPR REACTIVE (A) 06/08/2018   LABRPR REACTIVE (A) 04/25/2017   LABRPR REACTIVE (A) 10/12/2016   LABRPR Non Reactive 05/07/2016   LABRPR NON REAC 01/26/2016   RPRTITER 1:8 (H) 06/08/2018   RPRTITER 1:4 (H) 04/25/2017   RPRTITER 1:32 (A) 10/12/2016    STI Results GC GC CT CT  Latest Ref Rng & Units - NEGATIVE - NEGATIVE  05/07/2016 Negative - Negative -  01/26/2016 Negative - Negative -  08/28/2015 Negative - Negative -  04/29/2015 Negative - Negative -  01/12/2015 Negative - Negative -  10/23/2014 Negative - Negative -  08/08/2013 NG: Negative NEGATIVE CT: Negative NEGATIVE    Hepatitis B Lab Results  Component Value Date   HEPBSAB POS (A) 12/02/2013   HEPBSAG NEGATIVE 12/02/2013   HEPBCAB NON REACTIVE 12/02/2013   Hepatitis C No results found for: HEPCAB, HCVRNAPCRQN Hepatitis A Lab Results  Component Value Date   HAV REACTIVE (A) 12/02/2013   Lipids: Lab Results  Component Value Date   CHOL 168 01/26/2016   TRIG 84 01/26/2016   HDL 41 01/26/2016   CHOLHDL 4.1 01/26/2016   VLDL 17 01/26/2016   LDLCALC 110 01/26/2016    Current HIV Regimen: Genvoya & darunavir  Assessment: Cory Russell is here today to establish  care with Tammy Sours due to his poorly controlled HIV disease as a result of poor medication adherence. He has significant mutations leading to resistance of many medications including his most recent regimen of Genvoya and darunavir. Discussed new medication regimen of Juluca and Prezcobix based on current resistance patterns.  Counseled to take both medications once daily with a meal. Advised taking with largest meal, dinner, to promote efficacy and adherence to medications. Counseled on how important it is to take both of the medications everyday to prevent further resistance so we can effectively treat HIV. Counseled on possible side effects of headache and nausea but that these will typically go away after a couple weeks of taking the new medications. Discussed having a meal with the medications will also mitigate some of the associated nausea that may occur. Discussed taking Zofran about 30 minutes before taking the medications to also reduce nausea as the patient has stopped taking medications in the past due to nausea.  Ralphel has been prescribed acid suppressants in the past but states that this was for a short-term treatment. He is no longer taking these medications and was advised to call if he begins any new medications to ensure no drug interactions.  His ADAP is still pending and will likely be approved within the next week or two. Kathie Rhodes is working on getting him approved for a 30-day supply from the two manufacturers to get him started on his medications as soon as possible. Gave him Cassie's card and encouraged him to call her with any questions or concerns regarding his medications.    Plan: -Discontinue Genvoya & darunavir -Apply for manufacturer assistance for one month until ADAP approved -Initiate Juluca PO daily -Initiate Prezcobix PO daily -Zofran PRN -Follow-up with Tammy Sours in one month on 2/20 at 9:30am   Lawernce Keas P4 pharmacy Student [facility] 06/25/2018,  3:03 PM

## 2018-06-25 NOTE — Telephone Encounter (Addendum)
Cumulative HIV Genotype Data  Genotype Dates: June 2015, January 2016, March 2017  RT Mutations  D67N, A62V, K70R, M184V, K219E, Y179E, Y181C, V90I  PI Mutations  E35D, I62V, L63P, I64L, A71V, V77I  Integrase Mutations  N/A   Interpretation of Genotype Data per [employer] HIV Drug Resistance Database:  Nucleoside RTIs  Abacavir - high level resistance Zidovudine - high level resistance Emtricitabine - high level resistance Lamivudine - high level resistance Tenofovir - low level resistance   Non-Nucleoside RTIs  Doravirine - potential low level resistance Efavirenz - intermediate resistance Etravirine - intermediate resistance Nevirapine - high level resistance Rilpivirine - intermediate resistance   Protease Inhibitors  Atazanavir - susceptible Darunavir - susceptible Lopinavir - susceptible   Integrase Inhibitors  Bictegravir - N/A Dolutegravir - N/A Elvitegravir - predicted resistance Raltegravir - no predicted resistance   Patient's labs also indicate predicted resistance for elvitegravir and no predicted resistance for raltegravir.   Arnesia Vincelette L. Clova Morlock, PharmD, BCIDP, AAHIVP, CPP Infectious Diseases Clinical Pharmacist [facility] 06/25/2018, 9:08 AM

## 2018-06-25 NOTE — Patient Instructions (Signed)
Nice to meet you.  We will get you started on medication - Juluca and Prezcobix.  We will check your blood work today.  Plan for office follow up and blood work in 1 month or sooner if needed.  Let us know if you have any questions!

## 2018-06-25 NOTE — Assessment & Plan Note (Signed)
   Menveo and influenza updated today.  He is of good weight and discussed no indication for Megace at present.  Declines condoms. Discussed importance of safe sexual practice to reduce risk of acquisition and transmission of STI.  Referral to [facility] placed for dental screening.

## 2018-06-25 NOTE — Progress Notes (Signed)
Subjective:    Patient ID: Cory Russell, male    DOB: 1990/09/02, 28 y.o.   MRN: [id number]  Chief Complaint  Patient presents with  . Follow-up     HPI:  Cory ShawlCurtis Russell is a 28 y.o. male who presents today for routine follow up of HIV disease. He is present today with his mother who is present with his permission.   Cory Russell was last seen in the office on 04/25/17 for routine follow up with an ART regimen of Genvoya and Prezista having lt his ADAP lapse. Viral load at the time was 260 with CD4 of 470. Cory Russell has been out of care since that office appointment and most recently completed blood work on 06/08/18 with a CD4 count of 340 and viral load of 168,000. RPR was positive with a titer of 1:8 with most recent being 1:4. Kidney function, liver function and electrolytes are within the normal ranges. Health maintenance due includes Mevneo, influenza vaccination and dental screening.   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 He is currently unemployed and seeking work while living with his mother here in [place]. Treated for Syphilis by the Health Department receiving 2.4 million units of Bicillin once. Having occasional night sweats,decreased appetite and flares of depression. Has not been to a dentist in several years. Wanting to get back into care and get re-established. Cory Russell has history of nausea with most of his medication regimens including the most recent Genvoya and Prezcobix.   Cory Russell has a significant amount of resistance to medications as follows:  Cumulative HIV Genotype Data  Genotype Dates: June 2015, January 2016, March 2017  RT Mutations  D67N, A62V, K70R, M184V, K219E, Y179E, Y181C, V90I  PI Mutations  E35D, I62V, L63P, I64L, A71V, V77I  Integrase Mutations  N/A   Interpretation of Genotype Data per [facility] HIV Drug Resistance Database:  Nucleoside RTIs   Abacavir - high level resistance Zidovudine - high level resistance Emtricitabine - high level resistance Lamivudine - high level resistance Tenofovir - low level resistance   Non-Nucleoside RTIs  Doravirine - potential low level resistance Efavirenz - intermediate resistance Etravirine - intermediate resistance Nevirapine - high level resistance Rilpivirine - intermediate resistance   Protease Inhibitors  Atazanavir - susceptible Darunavir - susceptible Lopinavir - susceptible   Integrase Inhibitors  Bictegravir - N/A Dolutegravir - N/A Elvitegravir - predicted resistance Raltegravir - no predicted resistance   Patient's labs also indicate predicted resistance for elvitegravir and no predicted resistance for raltegravir.    No Known Allergies    Outpatient Medications Prior to Visit  Medication Sig Dispense Refill  . darunavir (PREZISTA) 800 MG tablet Take 1 tablet (800 mg total) by mouth daily. 30 tablet 5  . elvitegravir-cobicistat-emtricitabine-tenofovir (GENVOYA) 150-150-200-10 MG TABS tablet Take 1 tablet by mouth daily. 30 tablet 5  . famotidine (PEPCID) 40 MG tablet   0  . megestrol (MEGACE ES) 625 MG/5ML suspension Take 5 mLs (625 mg total) by mouth daily. 150 mL 5  . ondansetron (ZOFRAN) 4 MG tablet Take 1 tablet (4 mg total) by mouth daily at 8 pm. (Patient not taking: Reported on 10/12/2016) 30 tablet 5  . omeprazole (PRILOSEC) 40 MG capsule Take 1 capsule (40 mg total) by mouth daily. (Patient not taking: Reported on 10/14/2016) 28 capsule 1   No facility-administered medications prior to visit.      Past Medical  History:  Diagnosis Date  . Diabetes mellitus without complication (HCC)   . HIV (human immunodeficiency virus infection) (HCC)   . Hypertension      History reviewed. No pertinent surgical history.     Review of Systems  Constitutional: Positive for appetite change and unexpected weight change. Negative for chills, fatigue and fever.   Eyes: Negative for visual disturbance.  Respiratory: Negative for cough, chest tightness, shortness of breath and wheezing.   Cardiovascular: Negative for chest pain and leg swelling.  Gastrointestinal: Positive for nausea. Negative for abdominal pain, constipation, diarrhea and vomiting.  Genitourinary: Negative for dysuria, flank pain, frequency, genital sores, hematuria and urgency.  Skin: Negative for rash.  Allergic/Immunologic: Negative for immunocompromised state.  Neurological: Negative for dizziness and headaches.  Psychiatric/Behavioral: Positive for dysphoric mood.      Objective:    BP 127/82   Pulse 64   Temp 98.1 F (36.7 C) (Oral)   Wt 132 lb (59.9 kg)   BMI 20.67 kg/m  Nursing note and vital signs reviewed.  Physical Exam Constitutional:      General: He is not in acute distress.    Appearance: He is well-developed.  Eyes:     Conjunctiva/sclera: Conjunctivae normal.  Neck:     Musculoskeletal: Neck supple.  Cardiovascular:     Rate and Rhythm: Normal rate and regular rhythm.     Heart sounds: Normal heart sounds. No murmur. No friction rub. No gallop.   Pulmonary:     Effort: Pulmonary effort is normal. No respiratory distress.     Breath sounds: Normal breath sounds. No wheezing or rales.  Chest:     Chest wall: No tenderness.  Abdominal:     General: Bowel sounds are normal.     Palpations: Abdomen is soft.     Tenderness: There is no abdominal tenderness.  Lymphadenopathy:     Cervical: No cervical adenopathy.  Skin:    General: Skin is warm and dry.     Findings: No rash.  Neurological:     Mental Status: He is alert and oriented to person, place, and time.  Psychiatric:        Behavior: Behavior normal.        Thought Content: Thought content normal.        Judgment: Judgment normal.     Depression screen [facility] 2/9 06/25/2018 03/16/2017 10/12/2016 09/19/2016 02/09/2016  Decreased Interest 0 0 0 0 0  Down, Depressed, Hopeless 0 0 0 0 0  PHQ -  2 Score 0 0 0 0 0  Altered sleeping - - - - -  Tired, decreased energy - - - - -  Change in appetite - - - - -  Feeling bad or failure about yourself  - - - - -  Trouble concentrating - - - - -  Moving slowly or fidgety/restless - - - - -  Suicidal thoughts - - - - -  PHQ-9 Score - - - - -  Difficult doing work/chores - - - - -       Assessment & Plan:   Problem List Items Addressed This Visit      Other   Human immunodeficiency virus (HIV) disease (HCC) - Primary    Cory Russell has poorly controlled HIV disease due to poor medication adherence over the past 1+ year. He has no signs/symptoms of opportunistic infection or progressive HIV disease at present. He does have significant mutations leading to multiple medication resistance including his most recent  regimen of Genvoya and Prezista. Will obtain new gentotype with integrase inhibitors. Discussed importance of taking medication to reduce risk of progression of HIV disease associated with inflammation. ADAP remains pending at present and pharmacy staff was able to obtain medication from assistance program. Will start him on Juluca and Prezcobix pending results of genotype. Follow up in 1 month or sooner if needed to recheck viral load and progress.       Relevant Medications   darunavir-cobicistat (PREZCOBIX) 800-150 MG tablet   Dolutegravir-Rilpivirine (JULUCA) 50-25 MG TABS   Other Relevant Orders   HIV RNA, RTPCR W/R GT (RTI, PI,INT)   MENINGOCOCCAL MCV4O (Completed)   Depression    Current PHQ score of 0 and not maintained on medication. Declines speaking with counseling staff today. No evidence of exacerbation or signs of psychosis. Continue to monitor.         Healthcare maintenance     Menveo and influenza updated today.  He is of good weight and discussed no indication for Megace at present.  Declines condoms. Discussed importance of safe sexual practice to reduce risk of acquisition and transmission of  STI.  [facility] for dental screening.        Other Visit Diagnoses    Need for immunization against influenza       Relevant Orders   Flu Vaccine QUAD 36+ mos IM (Completed)       I have discontinued Cory Russell's omeprazole, famotidine, elvitegravir-cobicistat-emtricitabine-tenofovir, darunavir, and megestrol. I am also having him start on darunavir-cobicistat and Dolutegravir-Rilpivirine. Additionally, I am having him maintain his ondansetron.   Meds ordered this encounter  Medications  . darunavir-cobicistat (PREZCOBIX) 800-150 MG tablet    Sig: Take 1 tablet by mouth daily. Swallow whole. Do NOT crush, break or chew tablets. Take with food.    Dispense:  30 tablet    Refill:  2    Order Specific Question:   Supervising Provider    Answer:   Judyann MunsonSNIDER, CYNTHIA [[id number]]  . Dolutegravir-Rilpivirine (JULUCA) 50-25 MG TABS    Sig: Take 1 tablet by mouth daily.    Dispense:  30 tablet    Refill:  2    Order Specific Question:   Supervising Provider    Answer:   Judyann MunsonSNIDER, CYNTHIA [[id number]]    Follow-up: Return in about 1 month (around 07/26/2018), or if symptoms worsen or fail to improve.   Marcos EkeGreg Calone, MSN, FNP-C Nurse Practitioner [facility] Office phone: [phone] Pager: [phone] [facility] Main number: [phone]

## 2018-06-25 NOTE — Assessment & Plan Note (Signed)
Current PHQ score of 0 and not maintained on medication. Declines speaking with counseling staff today. No evidence of exacerbation or signs of psychosis. Continue to monitor.

## 2018-07-02 MED ORDER — DOLUTEGRAVIR-RILPIVIRINE 50-25 MG PO TABS: 1.0000 | ORAL_TABLET | Freq: Every day | ORAL | 0 refills | Status: DC

## 2018-07-02 NOTE — Addendum Note (Signed)
Addended by: Robinette Haines on: 07/02/2018 03:15 PM   Modules accepted: Orders

## 2018-07-04 LAB — HIV-1 GENOTYPE: HIV-1 Genotype: DETECTED — AB

## 2018-07-04 LAB — HIV RNA, RTPCR W/R GT (RTI, PI,INT)
HIV 1 RNA Quant: 264000 copies/mL — ABNORMAL HIGH
HIV-1 RNA Quant, Log: 5.42 Log copies/mL — ABNORMAL HIGH

## 2018-07-04 LAB — HIV-1 INTEGRASE GENOTYPE

## 2018-07-09 ENCOUNTER — Telehealth: Payer: Self-pay | Admitting: Pharmacy Technician

## 2018-07-18 NOTE — Telephone Encounter (Signed)
Per Cory Russell, now approved for ADAP.  Called Cory Russell to give the pharmacy phone number so that he can refill at [facility], [address] in [place].  Cory Russell. [name] CPhT [facility] Pharmacy Patient [facility] Phone: [phone] Fax:  [phone]

## 2018-07-19 ENCOUNTER — Other Ambulatory Visit: Payer: Self-pay | Admitting: Behavioral Health

## 2018-07-19 ENCOUNTER — Other Ambulatory Visit: Payer: Self-pay | Admitting: Internal Medicine

## 2018-07-19 DIAGNOSIS — B20 Human immunodeficiency virus [HIV] disease: Secondary | ICD-10-CM

## 2018-07-19 DIAGNOSIS — R630 Anorexia: Secondary | ICD-10-CM

## 2018-07-19 MED ORDER — DOLUTEGRAVIR-RILPIVIRINE 50-25 MG PO TABS: 1.0000 | ORAL_TABLET | Freq: Every day | ORAL | 2 refills | Status: DC

## 2018-07-19 MED ORDER — DARUNAVIR-COBICISTAT 800-150 MG PO TABS: 1.0000 | ORAL_TABLET | Freq: Every day | ORAL | 2 refills | Status: DC

## 2018-07-26 ENCOUNTER — Ambulatory Visit: Payer: Self-pay | Admitting: Family

## 2018-08-23 ENCOUNTER — Ambulatory Visit: Payer: Self-pay | Admitting: Family

## 2018-09-06 ENCOUNTER — Telehealth: Payer: Self-pay

## 2018-09-06 NOTE — Telephone Encounter (Signed)
Attempted to call patient to schedule overdue office visit. Unable to reach patient at this time. Left message requesting a call back. Lorenso Courier, [place]

## 2018-09-24 ENCOUNTER — Other Ambulatory Visit: Payer: Self-pay

## 2018-09-24 ENCOUNTER — Other Ambulatory Visit: Payer: Self-pay | Admitting: *Deleted

## 2018-09-24 DIAGNOSIS — B20 Human immunodeficiency virus [HIV] disease: Secondary | ICD-10-CM

## 2018-09-24 DIAGNOSIS — K649 Unspecified hemorrhoids: Secondary | ICD-10-CM

## 2018-09-24 MED ORDER — HYDROCORTISONE ACE-PRAMOXINE 2.5-1 % RE CREA: 1.0000 "application " | TOPICAL_CREAM | Freq: Three times a day (TID) | RECTAL | 1 refills | Status: DC

## 2018-09-24 NOTE — Telephone Encounter (Signed)
Patient was in the office for labs today and advised he has been having issues with his hemorrhoids. He advised for the past 3-4 days he has been having pain and light bleeding. He advised he was given Analpram 2.5-1% cream in the past and it worked and wants to ask the provider if he will prescribe it again. Advised him will ask the provider and give him a call once he responds.

## 2018-09-24 NOTE — Telephone Encounter (Signed)
Yes, ok to fill

## 2018-09-24 NOTE — Addendum Note (Signed)
Addended by: Lurlean Leyden on: 09/24/2018 03:59 PM   Modules accepted: Orders

## 2018-09-25 LAB — URINE CYTOLOGY ANCILLARY ONLY
Chlamydia: NEGATIVE
Neisseria Gonorrhea: NEGATIVE

## 2018-09-25 LAB — T-HELPER CELL (CD4) - (RCID CLINIC ONLY)
CD4 % Helper T Cell: 14 % — ABNORMAL LOW (ref 33–55)
CD4 T Cell Abs: 422 /uL (ref 400–2700)

## 2018-09-26 LAB — CBC WITH DIFFERENTIAL/PLATELET
Absolute Monocytes: 459 cells/uL (ref 200–950)
Basophils Absolute: 11 cells/uL (ref 0–200)
Basophils Relative: 0.2 %
Eosinophils Absolute: 11 cells/uL — ABNORMAL LOW (ref 15–500)
Eosinophils Relative: 0.2 %
HCT: 39.6 % (ref 38.5–50.0)
Hemoglobin: 13.1 g/dL — ABNORMAL LOW (ref 13.2–17.1)
Lymphs Abs: 2576 cells/uL (ref 850–3900)
MCH: 28.2 pg (ref 27.0–33.0)
MCHC: 33.1 g/dL (ref 32.0–36.0)
MCV: 85.2 fL (ref 80.0–100.0)
MPV: 9.4 fL (ref 7.5–12.5)
Monocytes Relative: 8.5 %
Neutro Abs: 2344 cells/uL (ref 1500–7800)
Neutrophils Relative %: 43.4 %
Platelets: 221 10*3/uL (ref 140–400)
RBC: 4.65 10*6/uL (ref 4.20–5.80)
RDW: 14 % (ref 11.0–15.0)
Total Lymphocyte: 47.7 %
WBC: 5.4 10*3/uL (ref 3.8–10.8)

## 2018-09-26 LAB — COMPLETE METABOLIC PANEL WITH GFR
AG Ratio: 1.3 (calc) (ref 1.0–2.5)
ALT: 11 U/L (ref 9–46)
AST: 16 U/L (ref 10–40)
Albumin: 4.3 g/dL (ref 3.6–5.1)
Alkaline phosphatase (APISO): 55 U/L (ref 36–130)
BUN: 15 mg/dL (ref 7–25)
CO2: 29 mmol/L (ref 20–32)
Calcium: 9.9 mg/dL (ref 8.6–10.3)
Chloride: 104 mmol/L (ref 98–110)
Creat: 1.02 mg/dL (ref 0.60–1.35)
GFR, Est African American: 115 mL/min/{1.73_m2} (ref >=60)
GFR, Est Non African American: 100 mL/min/{1.73_m2} (ref >=60)
Globulin: 3.4 g/dL (calc) (ref 1.9–3.7)
Glucose, Bld: 98 mg/dL (ref 65–99)
Potassium: 4.1 mmol/L (ref 3.5–5.3)
Sodium: 138 mmol/L (ref 135–146)
Total Bilirubin: 0.4 mg/dL (ref 0.2–1.2)
Total Protein: 7.7 g/dL (ref 6.1–8.1)

## 2018-09-26 LAB — HIV-1 RNA QUANT-NO REFLEX-BLD
HIV 1 RNA Quant: 303 copies/mL — ABNORMAL HIGH
HIV-1 RNA Quant, Log: 2.48 Log copies/mL — ABNORMAL HIGH

## 2018-10-23 ENCOUNTER — Telehealth: Payer: Self-pay | Admitting: Family

## 2018-10-23 NOTE — Telephone Encounter (Signed)
COVID-19 Pre-Screening Questions: ° °Do you currently have a fever (>100 °F), chills or unexplained body aches? No  ° °Are you currently experiencing new cough, shortness of breath, sore throat, runny nose?no   °•  °Have you recently travelled outside the state of [place] in the last 14 days? No  °•  °1. Have you been in contact with someone that is currently pending confirmation of Covid19 testing or has been confirmed to have the Covid19 virus?  No  ° °

## 2018-10-24 ENCOUNTER — Ambulatory Visit: Payer: Self-pay | Admitting: Internal Medicine

## 2018-10-24 ENCOUNTER — Ambulatory Visit: Payer: Self-pay | Admitting: Family

## 2018-11-26 ENCOUNTER — Telehealth: Payer: Self-pay | Admitting: Internal Medicine

## 2018-11-26 NOTE — Telephone Encounter (Signed)
COVID-19 Pre-Screening Questions: ° °Do you currently have a fever (>100 °F), chills or unexplained body aches? No  ° °Are you currently experiencing new cough, shortness of breath, sore throat, runny nose?no   °•  °Have you recently travelled outside the state of [place] in the last 14 days? No  °•  °1. Have you been in contact with someone that is currently pending confirmation of Covid19 testing or has been confirmed to have the Covid19 virus?  No  ° °

## 2018-11-27 ENCOUNTER — Other Ambulatory Visit: Payer: Self-pay

## 2018-11-27 ENCOUNTER — Ambulatory Visit (INDEPENDENT_AMBULATORY_CARE_PROVIDER_SITE_OTHER): Payer: Self-pay | Admitting: Internal Medicine

## 2018-11-27 ENCOUNTER — Encounter: Payer: Self-pay | Admitting: Internal Medicine

## 2018-11-27 VITALS — BP 110/68 | HR 60 | Temp 98.1°F | Wt 120.0 lb

## 2018-11-27 DIAGNOSIS — Z23 Encounter for immunization: Secondary | ICD-10-CM

## 2018-11-27 DIAGNOSIS — R630 Anorexia: Secondary | ICD-10-CM

## 2018-11-27 DIAGNOSIS — B20 Human immunodeficiency virus [HIV] disease: Secondary | ICD-10-CM

## 2018-11-27 DIAGNOSIS — Z113 Encounter for screening for infections with a predominantly sexual mode of transmission: Secondary | ICD-10-CM | POA: Insufficient documentation

## 2018-11-27 DIAGNOSIS — Z79899 Other long term (current) drug therapy: Secondary | ICD-10-CM

## 2018-11-27 MED ORDER — BICTEGRAVIR-EMTRICITAB-TENOFOV 50-200-25 MG PO TABS: 1.0000 | ORAL_TABLET | Freq: Every day | ORAL | 11 refills | Status: DC

## 2018-11-27 MED ORDER — MEGESTROL ACETATE 625 MG/5ML PO SUSP: 625.0000 mg | Freq: Every day | ORAL | 5 refills | Status: DC

## 2018-11-27 NOTE — Assessment & Plan Note (Signed)
Doing better.  I will go ahead and recheck his labs today since it has been two months.   Also, he feels he will be better off with a one pill a day regimen and despite a 184 v mutation, I do feel once daily Biktarvy will be sufficient, though I reminded him it will require better compliance and he is eager to change.   I will have him return in 3 months.

## 2018-11-27 NOTE — Progress Notes (Signed)
   Subjective:    Patient ID: Cory Russell, male    DOB: [birth year], 28 y.o.   MRN: [id number]  HPI Here for follow up of HIV After a long abscence saw my partner, Terri Piedra NP in January after having been off of medication.  He has a history of a 184V mutation along with a 181 and has been on Juluca and Prezcobix.  He misses about 2-3 doses per month.  He continues to struggle with weight gain.  CD4 in April up to 422 and viral load down to 303 from 264,000.  He feels well now and is getting back on track.     Review of Systems  Constitutional: Negative for fatigue.  Gastrointestinal: Negative for diarrhea and nausea.  Skin: Negative for rash.       Objective:   Physical Exam Constitutional:      Appearance: Normal appearance.  Eyes:     General: No scleral icterus. Musculoskeletal:        General: No swelling.  Skin:    Findings: No rash.  Neurological:     General: No focal deficit present.     Mental Status: He is alert.  Psychiatric:        Mood and Affect: Mood normal.        Behavior: Behavior normal.    SH: no tobacco       Assessment & Plan:

## 2018-11-27 NOTE — Assessment & Plan Note (Signed)
Lipid panel today

## 2018-11-27 NOTE — Assessment & Plan Note (Signed)
prevnar today Also HPV series #1

## 2018-11-27 NOTE — Assessment & Plan Note (Signed)
Will screen today 

## 2018-11-28 LAB — T-HELPER CELL (CD4) - (RCID CLINIC ONLY)
CD4 % Helper T Cell: 16 % — ABNORMAL LOW (ref 33–65)
CD4 T Cell Abs: 472 /uL (ref 400–1790)

## 2018-11-28 LAB — URINE CYTOLOGY ANCILLARY ONLY
Chlamydia: NEGATIVE
Neisseria Gonorrhea: NEGATIVE

## 2018-12-03 LAB — COMPLETE METABOLIC PANEL WITH GFR
AG Ratio: 1.5 (calc) (ref 1.0–2.5)
ALT: 13 U/L (ref 9–46)
AST: 20 U/L (ref 10–40)
Albumin: 4.3 g/dL (ref 3.6–5.1)
Alkaline phosphatase (APISO): 55 U/L (ref 36–130)
BUN: 16 mg/dL (ref 7–25)
CO2: 30 mmol/L (ref 20–32)
Calcium: 9.8 mg/dL (ref 8.6–10.3)
Chloride: 105 mmol/L (ref 98–110)
Creat: 1.27 mg/dL (ref 0.60–1.35)
GFR, Est African American: 89 mL/min/{1.73_m2} (ref >=60)
GFR, Est Non African American: 76 mL/min/{1.73_m2} (ref >=60)
Globulin: 2.9 g/dL (calc) (ref 1.9–3.7)
Glucose, Bld: 61 mg/dL — ABNORMAL LOW (ref 65–99)
Potassium: 4.9 mmol/L (ref 3.5–5.3)
Sodium: 139 mmol/L (ref 135–146)
Total Bilirubin: 0.5 mg/dL (ref 0.2–1.2)
Total Protein: 7.2 g/dL (ref 6.1–8.1)

## 2018-12-03 LAB — CBC WITH DIFFERENTIAL/PLATELET
Absolute Monocytes: 518 cells/uL (ref 200–950)
Basophils Absolute: 18 cells/uL (ref 0–200)
Basophils Relative: 0.4 %
Eosinophils Absolute: 41 cells/uL (ref 15–500)
Eosinophils Relative: 0.9 %
HCT: 38.7 % (ref 38.5–50.0)
Hemoglobin: 12.5 g/dL — ABNORMAL LOW (ref 13.2–17.1)
Lymphs Abs: 3128 cells/uL (ref 850–3900)
MCH: 28.5 pg (ref 27.0–33.0)
MCHC: 32.3 g/dL (ref 32.0–36.0)
MCV: 88.4 fL (ref 80.0–100.0)
MPV: 9.1 fL (ref 7.5–12.5)
Monocytes Relative: 11.5 %
Neutro Abs: 797 cells/uL — ABNORMAL LOW (ref 1500–7800)
Neutrophils Relative %: 17.7 %
Platelets: 222 10*3/uL (ref 140–400)
RBC: 4.38 10*6/uL (ref 4.20–5.80)
RDW: 14.7 % (ref 11.0–15.0)
Total Lymphocyte: 69.5 %
WBC: 4.5 10*3/uL (ref 3.8–10.8)

## 2018-12-03 LAB — LIPID PANEL
Cholesterol: 159 mg/dL (ref <200)
HDL: 40 mg/dL (ref >=40)
LDL Cholesterol (Calc): 105 mg/dL (calc) — ABNORMAL HIGH
Non-HDL Cholesterol (Calc): 119 mg/dL (calc) (ref <130)
Total CHOL/HDL Ratio: 4 (calc) (ref <5.0)
Triglycerides: 62 mg/dL (ref <150)

## 2018-12-03 LAB — HIV-1 RNA QUANT-NO REFLEX-BLD
HIV 1 RNA Quant: <20 copies/mL — AB
HIV-1 RNA Quant, Log: <1.3 Log copies/mL — AB

## 2018-12-03 LAB — RPR: RPR Ser Ql: REACTIVE — AB

## 2018-12-03 LAB — FLUORESCENT TREPONEMAL AB(FTA)-IGG-BLD: Fluorescent Treponemal ABS: REACTIVE — AB

## 2018-12-03 LAB — RPR TITER: RPR Titer: 1:2 {titer} — ABNORMAL HIGH

## 2018-12-04 ENCOUNTER — Telehealth: Payer: Self-pay | Admitting: *Deleted

## 2018-12-04 NOTE — Telephone Encounter (Signed)
Patient called to check and see what injections he needs when he comes in to re certify for his ADAP. After review of the chart advised him he needs HPV which is 3 shots.

## 2018-12-13 ENCOUNTER — Ambulatory Visit: Payer: Self-pay

## 2019-01-03 ENCOUNTER — Encounter: Payer: Self-pay | Admitting: Family

## 2019-01-23 ENCOUNTER — Ambulatory Visit (INDEPENDENT_AMBULATORY_CARE_PROVIDER_SITE_OTHER): Payer: Self-pay | Admitting: Internal Medicine

## 2019-01-23 ENCOUNTER — Encounter: Payer: Self-pay | Admitting: Internal Medicine

## 2019-01-23 ENCOUNTER — Other Ambulatory Visit: Payer: Self-pay

## 2019-01-23 DIAGNOSIS — K649 Unspecified hemorrhoids: Secondary | ICD-10-CM

## 2019-01-23 DIAGNOSIS — B20 Human immunodeficiency virus [HIV] disease: Secondary | ICD-10-CM

## 2019-01-23 MED ORDER — GABAPENTIN 300 MG PO CAPS: 300.0000 mg | ORAL_CAPSULE | Freq: Three times a day (TID) | ORAL | 1 refills | Status: DC

## 2019-01-23 NOTE — Assessment & Plan Note (Signed)
He has continued to well with this now.

## 2019-01-23 NOTE — Assessment & Plan Note (Addendum)
Known but now with pain.  ? If other issue going on - ? Abscess, ? Mass I will get him evaluated by a surgeon  22 minutes spent with patient and discussion of plan

## 2019-01-23 NOTE — Progress Notes (Signed)
   Subjective:    Patient ID: Normon Pettijohn, male    DOB: 1990/07/05, 28 y.o.   MRN: [id number]  HPI I connected with  Clary Meeker on 01/23/19 by a video enabled telemedicine application and verified that I am speaking with the correct person using two identifiers.   I discussed the limitations of evaluation and management by telemedicine. The patient expressed understanding and agreed to proceed. I engaged with him via phone.  He is complaining of rectal pain.  He has known hemorrhoids and I have treated him with hemorrhoidal cream.  He though now is having significant pain in his rectum.  Never had this before.  Is a nerve-like pain.  He also has a fever and decreased po intake.  No known sick contact.  No blood in his stool.  Some mucous-like stool output.  No weight loss.  Feels pain can be unbearable.     Review of Systems     Objective:   Physical Exam        Assessment & Plan:

## 2019-04-11 ENCOUNTER — Other Ambulatory Visit: Payer: Self-pay

## 2019-04-11 DIAGNOSIS — Z113 Encounter for screening for infections with a predominantly sexual mode of transmission: Secondary | ICD-10-CM

## 2019-04-11 DIAGNOSIS — Z79899 Other long term (current) drug therapy: Secondary | ICD-10-CM

## 2019-04-11 DIAGNOSIS — B20 Human immunodeficiency virus [HIV] disease: Secondary | ICD-10-CM

## 2019-04-29 ENCOUNTER — Encounter: Payer: Self-pay | Admitting: Internal Medicine

## 2019-06-28 ENCOUNTER — Other Ambulatory Visit: Payer: Self-pay

## 2019-06-28 MED ORDER — GABAPENTIN 300 MG PO CAPS: 300.0000 mg | ORAL_CAPSULE | Freq: Three times a day (TID) | ORAL | 1 refills | Status: DC

## 2019-07-03 ENCOUNTER — Ambulatory Visit: Payer: Self-pay

## 2019-07-03 ENCOUNTER — Other Ambulatory Visit: Payer: Self-pay

## 2019-07-16 ENCOUNTER — Ambulatory Visit: Payer: Self-pay | Admitting: Internal Medicine

## 2019-09-09 ENCOUNTER — Other Ambulatory Visit: Payer: Self-pay

## 2019-09-09 ENCOUNTER — Ambulatory Visit: Payer: Self-pay

## 2019-09-09 DIAGNOSIS — B20 Human immunodeficiency virus [HIV] disease: Secondary | ICD-10-CM

## 2019-09-09 DIAGNOSIS — Z113 Encounter for screening for infections with a predominantly sexual mode of transmission: Secondary | ICD-10-CM

## 2019-09-09 DIAGNOSIS — Z79899 Other long term (current) drug therapy: Secondary | ICD-10-CM

## 2019-09-10 LAB — URINE CYTOLOGY ANCILLARY ONLY
Chlamydia: NEGATIVE
Comment: NEGATIVE
Comment: NORMAL
Neisseria Gonorrhea: NEGATIVE

## 2019-09-10 LAB — T-HELPER CELL (CD4) - (RCID CLINIC ONLY)
CD4 % Helper T Cell: 18 % — ABNORMAL LOW (ref 33–65)
CD4 T Cell Abs: 359 /uL — ABNORMAL LOW (ref 400–1790)

## 2019-09-11 ENCOUNTER — Telehealth: Payer: Self-pay | Admitting: *Deleted

## 2019-09-11 LAB — COMPREHENSIVE METABOLIC PANEL
AG Ratio: 1.1 (calc) (ref 1.0–2.5)
ALT: 9 U/L (ref 9–46)
AST: 13 U/L (ref 10–40)
Albumin: 4.2 g/dL (ref 3.6–5.1)
Alkaline phosphatase (APISO): 72 U/L (ref 36–130)
BUN: 12 mg/dL (ref 7–25)
CO2: 23 mmol/L (ref 20–32)
Calcium: 9.6 mg/dL (ref 8.6–10.3)
Chloride: 106 mmol/L (ref 98–110)
Creat: 0.8 mg/dL (ref 0.60–1.35)
Globulin: 4 g/dL (calc) — ABNORMAL HIGH (ref 1.9–3.7)
Glucose, Bld: 97 mg/dL (ref 65–99)
Potassium: 3.6 mmol/L (ref 3.5–5.3)
Sodium: 137 mmol/L (ref 135–146)
Total Bilirubin: 0.5 mg/dL (ref 0.2–1.2)
Total Protein: 8.2 g/dL — ABNORMAL HIGH (ref 6.1–8.1)

## 2019-09-11 LAB — LIPID PANEL
Cholesterol: 148 mg/dL (ref <200)
HDL: 26 mg/dL — ABNORMAL LOW (ref >=40)
LDL Cholesterol (Calc): 106 mg/dL (calc) — ABNORMAL HIGH
Non-HDL Cholesterol (Calc): 122 mg/dL (calc) (ref <130)
Total CHOL/HDL Ratio: 5.7 (calc) — ABNORMAL HIGH (ref <5.0)
Triglycerides: 73 mg/dL (ref <150)

## 2019-09-11 LAB — CBC WITH DIFFERENTIAL/PLATELET
Absolute Monocytes: 903 cells/uL (ref 200–950)
Basophils Absolute: 22 cells/uL (ref 0–200)
Basophils Relative: 0.3 %
Eosinophils Absolute: 67 cells/uL (ref 15–500)
Eosinophils Relative: 0.9 %
HCT: 36.7 % — ABNORMAL LOW (ref 38.5–50.0)
Hemoglobin: 12.1 g/dL — ABNORMAL LOW (ref 13.2–17.1)
Lymphs Abs: 2146 cells/uL (ref 850–3900)
MCH: 28.6 pg (ref 27.0–33.0)
MCHC: 33 g/dL (ref 32.0–36.0)
MCV: 86.8 fL (ref 80.0–100.0)
MPV: 8.9 fL (ref 7.5–12.5)
Monocytes Relative: 12.2 %
Neutro Abs: 4262 cells/uL (ref 1500–7800)
Neutrophils Relative %: 57.6 %
Platelets: 360 10*3/uL (ref 140–400)
RBC: 4.23 10*6/uL (ref 4.20–5.80)
RDW: 14.8 % (ref 11.0–15.0)
Total Lymphocyte: 29 %
WBC: 7.4 10*3/uL (ref 3.8–10.8)

## 2019-09-11 LAB — RPR: RPR Ser Ql: REACTIVE — AB

## 2019-09-11 LAB — HIV-1 RNA QUANT-NO REFLEX-BLD
HIV 1 RNA Quant: 64 copies/mL — ABNORMAL HIGH
HIV-1 RNA Quant, Log: 1.81 Log copies/mL — ABNORMAL HIGH

## 2019-09-11 LAB — RPR TITER: RPR Titer: 1:128 {titer} — ABNORMAL HIGH

## 2019-09-11 LAB — FLUORESCENT TREPONEMAL AB(FTA)-IGG-BLD: Fluorescent Treponemal ABS: REACTIVE — AB

## 2019-09-11 NOTE — Telephone Encounter (Signed)
RN relayed results and treatment plan per Dr Luciana Axe. Patient verbalized understanding, will come 4/8 at 2:00 for treatment. Andree Coss, RN

## 2019-09-11 NOTE — Telephone Encounter (Signed)
-----   Message from Gardiner Barefoot, MD sent at 09/11/2019  9:52 AM EDT ----- New positive syphilis and needs 2.4 million units Bicillin x 1  thanks

## 2019-09-12 ENCOUNTER — Other Ambulatory Visit: Payer: Self-pay

## 2019-09-12 ENCOUNTER — Ambulatory Visit (INDEPENDENT_AMBULATORY_CARE_PROVIDER_SITE_OTHER): Payer: Self-pay

## 2019-09-12 DIAGNOSIS — A539 Syphilis, unspecified: Secondary | ICD-10-CM

## 2019-09-12 MED ORDER — PENICILLIN G BENZATHINE 1200000 UNIT/2ML IM SUSP
1.2000 10*6.[IU] | Freq: Once | INTRAMUSCULAR | Status: AC
  Administered 2019-09-12: 1.2 10*6.[IU] via INTRAMUSCULAR

## 2019-09-27 ENCOUNTER — Encounter: Payer: Self-pay | Admitting: Internal Medicine

## 2019-09-30 ENCOUNTER — Ambulatory Visit (INDEPENDENT_AMBULATORY_CARE_PROVIDER_SITE_OTHER): Payer: Self-pay | Admitting: Internal Medicine

## 2019-09-30 ENCOUNTER — Encounter: Payer: Self-pay | Admitting: Internal Medicine

## 2019-09-30 ENCOUNTER — Other Ambulatory Visit: Payer: Self-pay

## 2019-09-30 VITALS — BP 121/78 | HR 90 | Temp 97.9°F | Ht 67.0 in | Wt 123.0 lb

## 2019-09-30 DIAGNOSIS — Z113 Encounter for screening for infections with a predominantly sexual mode of transmission: Secondary | ICD-10-CM

## 2019-09-30 DIAGNOSIS — A539 Syphilis, unspecified: Secondary | ICD-10-CM | POA: Insufficient documentation

## 2019-09-30 DIAGNOSIS — K6289 Other specified diseases of anus and rectum: Secondary | ICD-10-CM

## 2019-09-30 DIAGNOSIS — B20 Human immunodeficiency virus [HIV] disease: Secondary | ICD-10-CM

## 2019-09-30 NOTE — Assessment & Plan Note (Signed)
Concerns found on recent rectal exam by surgery at [facility]  We are going to try to get him on the insurance exchange and then I will refer him to surgery here in [place] ([facility]) asap.

## 2019-09-30 NOTE — Assessment & Plan Note (Signed)
Doing well on his current regimen and no changes.   rtc 6 months

## 2019-09-30 NOTE — Progress Notes (Signed)
   Subjective:    Patient ID: Million Maharaj, male    DOB: 1991/01/27, 29 y.o.   MRN: [id number]  HPI Here for follow up of HIV Diagnosed with syphilis with his recent labs and received Bicillin x 1.  Since last visit he saw a surgeon at [facility] for his rectal pain and bleeding.  The surgeon found a concerning area but the patient was unable to afford the procedure cost.  No penile discharge.     Review of Systems  Constitutional: Negative for fatigue.  Gastrointestinal: Negative for diarrhea and nausea.  Skin: Negative for rash.       Objective:   Physical Exam Constitutional:      Appearance: Normal appearance.  Eyes:     General: No scleral icterus. Cardiovascular:     Rate and Rhythm: Normal rate and regular rhythm.     Heart sounds: No murmur.  Pulmonary:     Effort: Pulmonary effort is normal.  Neurological:     Mental Status: He is alert.  Psychiatric:        Mood and Affect: Mood normal.   SH: no tobacco        Assessment & Plan:

## 2019-09-30 NOTE — Assessment & Plan Note (Signed)
Will screen today 

## 2019-09-30 NOTE — Assessment & Plan Note (Signed)
Now treated.

## 2019-10-01 LAB — CYTOLOGY, (ORAL, ANAL, URETHRAL) ANCILLARY ONLY
Chlamydia: NEGATIVE
Chlamydia: NEGATIVE
Comment: NEGATIVE
Comment: NEGATIVE
Comment: NORMAL
Comment: NORMAL
Neisseria Gonorrhea: NEGATIVE
Neisseria Gonorrhea: POSITIVE — AB

## 2019-10-03 ENCOUNTER — Telehealth: Payer: Self-pay

## 2019-10-03 NOTE — Telephone Encounter (Signed)
Per MD called patient to schedule nurse visit for Gonorrhea. Will need Azithromycin and Rocephin.  Patient was made aware. Will come tomorrow for treatment.  Advised patient to inform recent partners to get tested treated at local health department Cory Russell, [place]

## 2019-10-03 NOTE — Telephone Encounter (Signed)
-----   Message from Gardiner Barefoot, MD sent at 10/02/2019  8:53 AM EDT ----- He is positive for GC if we could get him in for treatment.  thanks

## 2019-10-04 ENCOUNTER — Ambulatory Visit (INDEPENDENT_AMBULATORY_CARE_PROVIDER_SITE_OTHER): Payer: Self-pay

## 2019-10-04 ENCOUNTER — Other Ambulatory Visit: Payer: Self-pay

## 2019-10-04 DIAGNOSIS — Z202 Contact with and (suspected) exposure to infections with a predominantly sexual mode of transmission: Secondary | ICD-10-CM

## 2019-10-04 MED ORDER — CEFTRIAXONE SODIUM 250 MG IJ SOLR
500.0000 mg | Freq: Once | INTRAMUSCULAR | Status: AC
  Administered 2019-10-04: 500 mg via INTRAMUSCULAR

## 2019-10-04 MED ORDER — AZITHROMYCIN 250 MG PO TABS
1000.0000 mg | ORAL_TABLET | Freq: Once | ORAL | Status: AC
  Administered 2019-10-04: 1000 mg via ORAL

## 2019-12-02 ENCOUNTER — Other Ambulatory Visit: Payer: Self-pay | Admitting: Internal Medicine

## 2020-01-07 ENCOUNTER — Telehealth: Payer: Self-pay

## 2020-01-07 NOTE — Telephone Encounter (Signed)
Patient called to see if there was any response on his request; there is not yet. RN gave patient Internal Medicine phone number to establish primary care - [phone], asked him to please call to make an appointment for primary care. Will follow up with MD's response as well. Andree Coss, RN

## 2020-01-07 NOTE — Telephone Encounter (Signed)
Patient called office today requesting prescription to help with sleep/anxiety. States that he was prescribed Ambien by previous ID provider in [place], [place]. Has been off this medication for a year now, but due to recent stress factors feels like he needs to restart.  Patient states that for the past three months he has had issues falling/staying asleep. Has seen counselor in the past regarding anxiety, feels like appointments did not help. Patient does not have a PCP. Will look into finding local provider. Has tried Melatonin to help with sleep but does not help. Will forward message to MD to advise on prescription. Will call patient with response. Cory Russell, [place]

## 2020-01-08 ENCOUNTER — Other Ambulatory Visit: Payer: Self-pay | Admitting: Internal Medicine

## 2020-01-09 NOTE — Telephone Encounter (Signed)
Patient returned call. Patient updated with providers response.  Cory Russell

## 2020-01-09 NOTE — Telephone Encounter (Signed)
Called patient to update with MD's response. Patient is not able to take call at this time. Left voicemail requesting call back. Lorenso Courier, [place]

## 2020-01-09 NOTE — Telephone Encounter (Signed)
Ok to fill ambien 5 mg nightly prn.  I do not have current access to the verification app for controlled substances so will be a couple of days to get that set up.  thanks

## 2020-01-16 ENCOUNTER — Telehealth: Payer: Self-pay

## 2020-01-16 ENCOUNTER — Other Ambulatory Visit: Payer: Self-pay

## 2020-01-16 DIAGNOSIS — G4700 Insomnia, unspecified: Secondary | ICD-10-CM

## 2020-01-16 MED ORDER — ZOLPIDEM TARTRATE 5 MG PO TABS: 5.0000 mg | ORAL_TABLET | Freq: Every evening | ORAL | 0 refills | Status: DC | PRN

## 2020-01-16 NOTE — Telephone Encounter (Signed)
Yes, if you can send it, go ahead.  I have not yet got it set up to send electronically.  thanks

## 2020-01-16 NOTE — Progress Notes (Signed)
Medication called to [facility] in [place]  [phone]  Due to medication not being covered by  ADAP. Patient lives closer to this location. Previous order at [employer] [facility] [employer] location Rx canceled via phone in. Valarie Cones

## 2020-01-16 NOTE — Telephone Encounter (Signed)
Patient called to follow up on medication refill; previous notes indicate provider needed to check state verification app. Will follow up with patient after prescription sent.  Islay Polanco Loyola Mast, RN

## 2020-01-16 NOTE — Addendum Note (Signed)
Addended by: Valarie Cones on: 01/16/2020 04:29 PM   Modules accepted: Orders

## 2020-01-16 NOTE — Telephone Encounter (Signed)
Called in prescription per provider order. No refills provided; instructed patient to get further refills at PCP office.  Ulric Salzman Loyola Mast, RN

## 2020-01-16 NOTE — Telephone Encounter (Signed)
error 

## 2020-01-17 ENCOUNTER — Other Ambulatory Visit: Payer: Self-pay | Admitting: Internal Medicine

## 2020-01-22 ENCOUNTER — Ambulatory Visit: Payer: Self-pay | Admitting: Internal Medicine

## 2020-03-18 ENCOUNTER — Encounter: Payer: Self-pay | Admitting: Student

## 2020-03-19 ENCOUNTER — Encounter: Payer: Self-pay | Admitting: Student

## 2020-03-19 ENCOUNTER — Encounter: Payer: Self-pay | Admitting: Internal Medicine

## 2020-03-19 ENCOUNTER — Other Ambulatory Visit (HOSPITAL_COMMUNITY): Payer: Self-pay | Admitting: Student

## 2020-03-19 ENCOUNTER — Ambulatory Visit: Payer: Self-pay | Admitting: Student

## 2020-03-19 ENCOUNTER — Other Ambulatory Visit: Payer: Self-pay

## 2020-03-19 ENCOUNTER — Ambulatory Visit (INDEPENDENT_AMBULATORY_CARE_PROVIDER_SITE_OTHER): Payer: Self-pay | Admitting: Internal Medicine

## 2020-03-19 VITALS — BP 140/81 | HR 62 | Temp 98.4°F | Ht 67.0 in | Wt 130.0 lb

## 2020-03-19 DIAGNOSIS — K6289 Other specified diseases of anus and rectum: Secondary | ICD-10-CM

## 2020-03-19 DIAGNOSIS — E119 Type 2 diabetes mellitus without complications: Secondary | ICD-10-CM

## 2020-03-19 DIAGNOSIS — B20 Human immunodeficiency virus [HIV] disease: Secondary | ICD-10-CM

## 2020-03-19 DIAGNOSIS — I10 Essential (primary) hypertension: Secondary | ICD-10-CM

## 2020-03-19 DIAGNOSIS — F32A Depression, unspecified: Secondary | ICD-10-CM

## 2020-03-19 DIAGNOSIS — Z113 Encounter for screening for infections with a predominantly sexual mode of transmission: Secondary | ICD-10-CM

## 2020-03-19 DIAGNOSIS — F322 Major depressive disorder, single episode, severe without psychotic features: Secondary | ICD-10-CM

## 2020-03-19 DIAGNOSIS — Z23 Encounter for immunization: Secondary | ICD-10-CM

## 2020-03-19 HISTORY — DX: Depression, unspecified: F32.A

## 2020-03-19 MED ORDER — MIRTAZAPINE 15 MG PO TABS: 15.0000 mg | ORAL_TABLET | Freq: Every day | ORAL | 0 refills | Status: DC

## 2020-03-19 MED FILL — MIRTAZAPINE 15 MG TABLET: 15 | 30 days supply | Qty: 30 | Fill #0

## 2020-03-19 NOTE — Assessment & Plan Note (Signed)
He continues to do well with this and no issues.  Continue Biktarvy and rtc in 6 months.

## 2020-03-19 NOTE — Assessment & Plan Note (Signed)
Will screen today 

## 2020-03-19 NOTE — Assessment & Plan Note (Signed)
There was a concerning finding on his digital rectal exam by surgery at [facility] and needs follow up for this.  Will try to arrange again.

## 2020-03-19 NOTE — Progress Notes (Signed)
° °  CC: To establish care  HPI:  [name] is a 29 y.o. M with PMH of HIV, depression and hypertension who presents to the clinic to establish care.   Please see problem based charting for evaluation, assessment and plan.  Past Medical History:  Diagnosis Date   Depression 03/19/2020   Started on Rameron on 03/19/20   Diabetes mellitus without complication (HCC)    HIV (human immunodeficiency virus infection) (HCC)    Hypertension    Family History:  Mother with history of diabetes. History of hypertension in father who passed away from metastatic prostate cancer in 06-30-19. Brother has a history of HTN and alcohol abuse.  Social History:  Patient states he completed college at [employer] in 2015. 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 He denies ETOH use or smoking. He endorse marijuana use daily.   Review of Systems: Review of Systems  Constitutional: Negative for weight loss.  Eyes: Negative for blurred vision.  Respiratory: Negative for shortness of breath.   Cardiovascular: Negative for chest pain.  Gastrointestinal: Positive for nausea. Negative for abdominal pain.  Skin: Negative for rash.  Neurological: Negative for dizziness and headaches.  Psychiatric/Behavioral: Positive for depression. The patient has insomnia.    Physical Exam:  General: Pleasant young male. No acute distress. Well nourished, well developed. Head: Normocephalic, atraumatic w/o tenderness Cardiac: Bradycardia. Regular rhythm. No murmurs, rubs or gallops. S1, S2. No lower extremities edema Respiratory: Lungs CTAB. No wheezing or crackles. No increased WOB Abdominal: Soft, symmetric and non tender. No organomegaly. Normal bowel sounds Skin: Warm, dry and intact without rashes or lesions Extremities: Atraumatic. Full ROM. Pulse palpable. Neuro: A&O x  3. Moves all extremities. Normal sensation. Psych: Appropriate mood and affect. Normal judgement  Vitals:   03/19/20 1424  BP: 137/84  Pulse: (!) 54  Temp: 98.4 F (36.9 C)  TempSrc: Oral  SpO2: 99%  Weight: 130 lb 4.8 oz (59.1 kg)    Assessment & Plan:   See Encounters Tab for problem based charting.  Patient seen with Dr. Curt Bears, MD, MPH

## 2020-03-19 NOTE — Progress Notes (Signed)
   Subjective:    Patient ID: Cory Russell, male    DOB: April 24, 1991, 29 y.o.   MRN: [id number]  HPI Here for follow up of HIV He continues on Biktarvy with no missed doses.  I referred him last year to surgery where a concerning mass was found.  He did not follow up with them due to cost issues and was going to get on the exchange but never did.     Review of Systems  Constitutional: Negative for fatigue.  Gastrointestinal: Negative for diarrhea and nausea.  Skin: Negative for rash.       Objective:   Physical Exam Constitutional:      Appearance: Normal appearance.  Eyes:     General: No scleral icterus. Cardiovascular:     Rate and Rhythm: Normal rate and regular rhythm.     Heart sounds: No murmur heard.   Pulmonary:     Effort: Pulmonary effort is normal.  Neurological:     Mental Status: He is alert.  Psychiatric:        Mood and Affect: Mood normal.   SH: no tobacco        Assessment & Plan:

## 2020-03-19 NOTE — Patient Instructions (Addendum)
Thank you, [name] for allowing [place] to provide your care today. Today we discussed your depression. We plan to start you on a medication and this medication will be titrated up to help treat your depression. We want you to reach out to your local hospice to assist their free grief counseling. Feel free to call us if you have any questions or concerns.   I have ordered the following medication/changed the following medications:  1. Mirtazapine (Remeron) 15 mg daily at bedtime  Please follow-up in 4 weeks.  Should you have any questions or concerns please call the internal medicine clinic at [phone].    Sharrell Ku, MD, MPH [facility]  My Chart Access: [email or web address]   If you have not already done so, please get your COVID 19 vaccine  To schedule an appointment for a COVID vaccine choice any of the following: Go to [email or web address]   Go to [email or web address]                  Call [phone]                                     Call [phone] and select Option 2

## 2020-03-20 ENCOUNTER — Encounter: Payer: Self-pay | Admitting: Student

## 2020-03-20 LAB — URINE CYTOLOGY ANCILLARY ONLY
Chlamydia: NEGATIVE
Comment: NEGATIVE
Comment: NORMAL
Neisseria Gonorrhea: NEGATIVE

## 2020-03-20 LAB — CYTOLOGY, (ORAL, ANAL, URETHRAL) ANCILLARY ONLY
Chlamydia: NEGATIVE
Chlamydia: NEGATIVE
Comment: NEGATIVE
Comment: NEGATIVE
Comment: NORMAL
Comment: NORMAL
Neisseria Gonorrhea: NEGATIVE
Neisseria Gonorrhea: NEGATIVE

## 2020-03-20 LAB — T-HELPER CELL (CD4) - (RCID CLINIC ONLY)
CD4 % Helper T Cell: 13 % — ABNORMAL LOW (ref 33–65)
CD4 T Cell Abs: 339 /uL — ABNORMAL LOW (ref 400–1790)
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
## 2020-03-20 NOTE — Progress Notes (Signed)
Internal Medicine Clinic Attending  I saw and evaluated the patient.  I personally confirmed the key portions of the history and exam documented by Dr. Amponsah and I reviewed pertinent patient test results.  The assessment, diagnosis, and plan were formulated together and I agree with the documentation in the resident's note.  

## 2020-03-20 NOTE — Assessment & Plan Note (Signed)
Well-controlled on Biktarvy. Viral load is undetectable. Follows up with Dr. Luciana Axe every 6 months. Has rectal mass that needs to be addressed. Will continue to monitor.

## 2020-03-20 NOTE — Assessment & Plan Note (Signed)
BP is stable at 137/84. No need for an antihypertensive at the moment. Will continue to monitor. BMP at next follow up.

## 2020-03-21 LAB — RPR: RPR Ser Ql: REACTIVE — AB

## 2020-03-21 LAB — HIV-1 RNA QUANT-NO REFLEX-BLD
HIV 1 RNA Quant: 3150 Copies/mL — ABNORMAL HIGH
HIV-1 RNA Quant, Log: 3.5 Log cps/mL — ABNORMAL HIGH

## 2020-03-21 LAB — FLUORESCENT TREPONEMAL AB(FTA)-IGG-BLD: Fluorescent Treponemal ABS: REACTIVE — AB

## 2020-03-21 LAB — RPR TITER: RPR Titer: 1:8 {titer} — ABNORMAL HIGH

## 2020-03-23 ENCOUNTER — Telehealth: Payer: Self-pay | Admitting: Student

## 2020-03-23 ENCOUNTER — Telehealth: Payer: Self-pay

## 2020-03-23 DIAGNOSIS — B20 Human immunodeficiency virus [HIV] disease: Secondary | ICD-10-CM

## 2020-03-23 NOTE — Addendum Note (Signed)
Addended by: Valarie Cones on: 03/23/2020 04:27 PM   Modules accepted: Orders

## 2020-03-23 NOTE — Telephone Encounter (Signed)
Pt states mirtazapine (REMERON) 15 MG tablet is not working for him, please call pt back.

## 2020-03-23 NOTE — Addendum Note (Signed)
Addended by: Harley Alto on: 03/23/2020 04:35 PM   Modules accepted: Orders

## 2020-03-23 NOTE — Telephone Encounter (Signed)
-----   Message from Gardiner Barefoot, MD sent at 03/23/2020 12:49 PM EDT ----- Can a genotype be added to his viral load? Thanks

## 2020-03-23 NOTE — Addendum Note (Signed)
Addended by: Harley Alto on: 03/23/2020 04:30 PM   Modules accepted: Orders

## 2020-03-23 NOTE — Telephone Encounter (Signed)
Spoke with patient on the phone who expressed that he was thinking about stopping his Remeron because he has not had any improvement in his mood since starting the medication last Friday. Patient states the Remeron helps him sleep but makes him drowsy and down all day. I informed patient that these medications takes a while to work and that he should continue it for now and we can reassess after a week of starting the medication. He was informed to call the office if he starts having suicidal thoughts.

## 2020-04-06 ENCOUNTER — Telehealth: Payer: Self-pay | Admitting: *Deleted

## 2020-04-06 DIAGNOSIS — B20 Human immunodeficiency virus [HIV] disease: Secondary | ICD-10-CM

## 2020-04-06 NOTE — Telephone Encounter (Signed)
-----   Message from Gardiner Barefoot, MD sent at 04/06/2020  1:41 PM EDT ----- Genotype added on recent labs without significant concerns.  Let's just repeat his viral load in the next couple of weeks or so.  thanks

## 2020-04-06 NOTE — Telephone Encounter (Signed)
Relayed to patient, scheduled him for labs 11/16. Orders placed. Andree Coss, RN

## 2020-04-11 LAB — HIV-1 INTEGRASE GENOTYPE

## 2020-04-11 LAB — HIV RNA, RTPCR W/R GT (RTI, PI,INT)
HIV 1 RNA Quant: 4220 copies/mL — ABNORMAL HIGH
HIV-1 RNA Quant, Log: 3.63 Log copies/mL — ABNORMAL HIGH

## 2020-04-11 LAB — HIV-1 GENOTYPE: HIV-1 Genotype: DETECTED — AB

## 2020-04-21 ENCOUNTER — Other Ambulatory Visit: Payer: Self-pay

## 2020-04-21 NOTE — Telephone Encounter (Signed)
mirtazapine (REMERON) 15 MG tablet(Expired, refill request @  [facility] - [place], [place] - [address] [facility]. Phone:  [phone]  Fax:  [phone]

## 2020-04-22 MED ORDER — MIRTAZAPINE 15 MG PO TABS: 15.0000 mg | ORAL_TABLET | Freq: Every day | ORAL | 0 refills | Status: DC

## 2020-04-22 MED FILL — MIRTAZAPINE 15 MG TABLET: 15 | 30 days supply | Qty: 30 | Fill #0

## 2020-04-22 NOTE — Telephone Encounter (Signed)
Patient needs a follow up appointment to discuss efficacy of mirtazapine

## 2020-05-19 ENCOUNTER — Ambulatory Visit (INDEPENDENT_AMBULATORY_CARE_PROVIDER_SITE_OTHER): Payer: Self-pay | Admitting: Internal Medicine

## 2020-05-19 ENCOUNTER — Other Ambulatory Visit (HOSPITAL_COMMUNITY): Payer: Self-pay | Admitting: Internal Medicine

## 2020-05-19 VITALS — BP 131/81 | HR 87 | Temp 98.4°F | Wt 138.5 lb

## 2020-05-19 DIAGNOSIS — B20 Human immunodeficiency virus [HIV] disease: Secondary | ICD-10-CM

## 2020-05-19 DIAGNOSIS — F322 Major depressive disorder, single episode, severe without psychotic features: Secondary | ICD-10-CM

## 2020-05-19 DIAGNOSIS — K6289 Other specified diseases of anus and rectum: Secondary | ICD-10-CM

## 2020-05-19 DIAGNOSIS — Z21 Asymptomatic human immunodeficiency virus [HIV] infection status: Secondary | ICD-10-CM

## 2020-05-19 MED ORDER — ESCITALOPRAM OXALATE 10 MG PO TABS: 10.0000 mg | ORAL_TABLET | Freq: Every day | ORAL | 2 refills | Status: DC

## 2020-05-19 MED FILL — ESCITALOPRAM 10 MG TABLET: 10 | 30 days supply | Qty: 30 | Fill #0

## 2020-05-19 NOTE — Assessment & Plan Note (Signed)
Does not feel like mirtazapine has helped his depression much. Previous PHQ-9 20, today 17. It has helped him fall asleep but he continues to wake up around 4am. He also has started to have headaches sometimes since starting the medication.   - switch to lexapro 10 mg  - referred to behavioral health  - f/u in one month with telehealth visit or in person

## 2020-05-19 NOTE — Progress Notes (Signed)
° °  CC: depression  HPI:  Cory Russell is a 29 y.o. with PMH as below.   Please see A&P for assessment of the patient's acute and chronic medical conditions.   Past Medical History:  Diagnosis Date   Depression 03/19/2020   Started on Rameron on 03/19/20   Diabetes mellitus without complication (HCC)    HIV (human immunodeficiency virus infection) (HCC)    Hypertension    Review of Systems:   Review of Systems  Constitutional: Negative for chills, fever, malaise/fatigue and weight loss.  Gastrointestinal: Negative for abdominal pain, blood in stool, constipation, diarrhea, melena and nausea.  Neurological: Positive for headaches. Negative for dizziness.  Psychiatric/Behavioral: Positive for depression. Negative for suicidal ideas. The patient is not nervous/anxious and does not have insomnia.    Physical Exam:  Constitution: NAD, appears stated age Cardio: RRR, no m/r/g, no LE edema  Respiratory: CTA, no w/r/r MSK: moving all extremities Neuro: normal affect, a&ox3, pleasant Skin: c/d/i    Vitals:   05/19/20 1116  BP: 131/81  Pulse: 87  Temp: 98.4 F (36.9 C)  TempSrc: Oral  SpO2: 99%  Weight: 138 lb 8 oz (62.8 kg)    Assessment & Plan:   See Encounters Tab for problem based charting.  Patient discussed with Dr. Sandre Kitty

## 2020-05-19 NOTE — Patient Instructions (Addendum)
Thank you for allowing [place] to provide your care today. Today we discussed your depression.   I have ordered the following labs for you:  Complete metabolic panel, complete blood count   I will call if any are abnormal.    Today we made the following changes to your medications:   Please START taking  lexapro 10 mg - take one tablet per day  Please STOP taking   Mirtazapine  Please follow-up in one month to assess how the medication is working for you.   Please follow-up with the front desk for financial counseling.   Please call the internal medicine center clinic if you have any questions or concerns, we may be able to help and keep you from a long and expensive emergency room wait. Our clinic and after hours phone number is [phone], the best time to call is Monday through Friday 9 am to 4 pm but there is always someone available 24/7 if you have an emergency. If you need medication refills please notify your pharmacy one week in advance and they will send [place] a request.

## 2020-05-19 NOTE — Assessment & Plan Note (Addendum)
History of rectal mass on prior exam. He was unable to follow-up with surgery due to no insurance. Dr. Luciana Axe is planning to have him follow-up with another surgeon. The patient does not currently have the orange card. He has no bloody stool. No weight loss, no fever, chills.   - will have him meet with financial counseling to obtain orange card - message sent to Dr. Luciana Axe to see if they have additional resources for referral  - check CBC

## 2020-05-20 ENCOUNTER — Encounter: Payer: Self-pay | Admitting: Internal Medicine

## 2020-05-20 LAB — CBC
Hematocrit: 37.8 % (ref 37.5–51.0)
Hemoglobin: 12.6 g/dL — ABNORMAL LOW (ref 13.0–17.7)
MCH: 29.3 pg (ref 26.6–33.0)
MCHC: 33.3 g/dL (ref 31.5–35.7)
MCV: 88 fL (ref 79–97)
Platelets: 249 10*3/uL (ref 150–450)
RBC: 4.3 x10E6/uL (ref 4.14–5.80)
RDW: 14.1 % (ref 11.6–15.4)
WBC: 6.6 10*3/uL (ref 3.4–10.8)

## 2020-05-20 LAB — CMP14 + ANION GAP
ALT: 24 IU/L (ref 0–44)
AST: 34 IU/L (ref 0–40)
Albumin/Globulin Ratio: 1.6 (ref 1.2–2.2)
Albumin: 4.5 g/dL (ref 4.1–5.2)
Alkaline Phosphatase: 71 IU/L (ref 44–121)
Anion Gap: 15 mmol/L (ref 10.0–18.0)
BUN/Creatinine Ratio: 11 (ref 9–20)
BUN: 10 mg/dL (ref 6–20)
Bilirubin Total: 0.3 mg/dL (ref 0.0–1.2)
CO2: 26 mmol/L (ref 20–29)
Calcium: 9.4 mg/dL (ref 8.7–10.2)
Chloride: 97 mmol/L (ref 96–106)
Creatinine, Ser: 0.93 mg/dL (ref 0.76–1.27)
GFR calc Af Amer: 127 mL/min/{1.73_m2} (ref >59)
GFR calc non Af Amer: 110 mL/min/{1.73_m2} (ref >59)
Globulin, Total: 2.9 g/dL (ref 1.5–4.5)
Glucose: 104 mg/dL — ABNORMAL HIGH (ref 65–99)
Potassium: 3.7 mmol/L (ref 3.5–5.2)
Sodium: 138 mmol/L (ref 134–144)
Total Protein: 7.4 g/dL (ref 6.0–8.5)

## 2020-05-20 NOTE — Progress Notes (Signed)
Internal Medicine Clinic Attending  Case discussed with Dr. Seawell at the time of the visit.  We reviewed the resident's history and exam and pertinent patient test results.  I agree with the assessment, diagnosis, and plan of care documented in the resident's note.  Caya Soberanis, M.D., Ph.D.  

## 2020-06-16 ENCOUNTER — Institutional Professional Consult (permissible substitution): Payer: Self-pay | Admitting: Behavioral Health

## 2020-07-01 ENCOUNTER — Telehealth: Payer: Self-pay

## 2020-07-01 NOTE — Telephone Encounter (Signed)
ERROR

## 2020-07-01 NOTE — Telephone Encounter (Signed)
Received TC from patient who is asking for refills on Lexapro.  Pt informed there should be 2 refills available at [facility].  Patient states he would like to pick them up at another pharmacy.  Pt instructed to call his pharmacy of choice and ask if RX's can be transferred from [facility], he verbalized understanding. SChaplin, RN,BSN

## 2020-07-06 ENCOUNTER — Other Ambulatory Visit: Payer: Self-pay | Admitting: *Deleted

## 2020-07-06 ENCOUNTER — Other Ambulatory Visit (HOSPITAL_COMMUNITY)
Admission: RE | Admit: 2020-07-06 | Discharge: 2020-07-06 | Disposition: A | Discharge status: Home / Self Care | Payer: 59 | Source: Ambulatory Visit | Attending: Internal Medicine | Admitting: Internal Medicine

## 2020-07-06 ENCOUNTER — Ambulatory Visit (INDEPENDENT_AMBULATORY_CARE_PROVIDER_SITE_OTHER): Payer: 59 | Admitting: Internal Medicine

## 2020-07-06 ENCOUNTER — Encounter: Payer: Self-pay | Admitting: Internal Medicine

## 2020-07-06 ENCOUNTER — Other Ambulatory Visit: Payer: Self-pay

## 2020-07-06 VITALS — BP 131/81 | HR 72 | Temp 97.9°F | Ht 68.0 in | Wt 133.0 lb

## 2020-07-06 DIAGNOSIS — A539 Syphilis, unspecified: Secondary | ICD-10-CM

## 2020-07-06 DIAGNOSIS — B20 Human immunodeficiency virus [HIV] disease: Secondary | ICD-10-CM | POA: Diagnosis not present

## 2020-07-06 DIAGNOSIS — Z7689 Persons encountering health services in other specified circumstances: Secondary | ICD-10-CM | POA: Diagnosis not present

## 2020-07-06 DIAGNOSIS — Z113 Encounter for screening for infections with a predominantly sexual mode of transmission: Secondary | ICD-10-CM | POA: Insufficient documentation

## 2020-07-06 MED ORDER — BIKTARVY 50-200-25 MG PO TABS: 1.0000 | ORAL_TABLET | Freq: Every day | ORAL | 4 refills | Status: DC

## 2020-07-06 MED ORDER — PENICILLIN G BENZATHINE 1200000 UNIT/2ML IM SUSP
1.2000 10*6.[IU] | Freq: Once | INTRAMUSCULAR | Status: AC
  Administered 2020-07-06: 1.2 10*6.[IU] via INTRAMUSCULAR

## 2020-07-06 MED ORDER — CEFTRIAXONE SODIUM 500 MG IJ SOLR
500.0000 mg | Freq: Once | INTRAMUSCULAR | Status: AC
  Administered 2020-07-06: 500 mg via INTRAMUSCULAR

## 2020-07-06 MED ORDER — AZITHROMYCIN 250 MG PO TABS
1000.0000 mg | ORAL_TABLET | Freq: Once | ORAL | Status: AC
  Administered 2020-07-06: 1000 mg via ORAL

## 2020-07-06 MED FILL — ESCITALOPRAM 10 MG TABLET: 10 | 30 days supply | Qty: 30 | Fill #1
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
## 2020-07-06 NOTE — Addendum Note (Signed)
Addended by: Andree Coss on: 07/06/2020 12:27 PM   Modules accepted: Orders

## 2020-07-06 NOTE — Assessment & Plan Note (Signed)
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  Previous RPR 1:8 in October 2021.  PLAN: . Treat with Bicillin x 1 today . RPR . Follow up in 3 months

## 2020-07-06 NOTE — Progress Notes (Addendum)
[facility]  Reason for Consult: STI exposure  Referring Provider: n/a   HPI:    Cory Russell is a 30 y.o. male with PMHx as below who presents to the clinic for STI exposure.   He is followed by Dr Luciana Axe for HIV and currently on Biktarvy.  He had a new unprotected encounter with partner a few weeks back.  Subsequently developed an oral ulcer and one on tip of tongue about 2-3 weeks ago.  Partner told patient he tested positive for syphilis. Patient engaged in oral and anal intercourse with this partner.     Patient's Medications  New Prescriptions   No medications on file  Previous Medications   ESCITALOPRAM (LEXAPRO) 10 MG TABLET    Take 1 tablet (10 mg total) by mouth daily.   MEGESTROL (MEGACE ES) 625 MG/5ML SUSPENSION    SHAKE LIQUID AND TAKE 5 ML(625 MG) BY MOUTH DAILY  Modified Medications   Modified Medication Previous Medication   BICTEGRAVIR-EMTRICITABINE-TENOFOVIR AF (BIKTARVY) 50-200-25 MG TABS TABLET BIKTARVY 50-200-25 MG TABS tablet      Take 1 tablet by mouth daily.    TAKE 1 TABLET BY MOUTH DAILY  Discontinued Medications   GABAPENTIN (NEURONTIN) 300 MG CAPSULE    TAKE 1 CAPSULE(300 MG) BY MOUTH THREE TIMES DAILY   HYDROCORTISONE-PRAMOXINE (ANALPRAM-HC) 2.5-1 % RECTAL CREAM    Place 1 application rectally 3 (three) times daily.   ONDANSETRON (ZOFRAN) 4 MG TABLET    Take 1 tablet (4 mg total) by mouth daily at 8 pm.   ZOLPIDEM (AMBIEN) 5 MG TABLET    Take 1 tablet (5 mg total) by mouth at bedtime as needed for up to 30 doses for sleep.      Past Medical History:  Diagnosis Date  . Depression 03/19/2020   Started on Rameron on 03/19/20  . Diabetes mellitus without complication (HCC)   . HIV (human immunodeficiency virus infection) (HCC)   . Hypertension     Social History   Tobacco Use  . Smoking status: Never Smoker  . Smokeless tobacco: Never Used  Substance Use Topics  . Alcohol use: Yes    Alcohol/week: 0.0  standard drinks    Comment: occasional  . Drug use: Yes    Types: Marijuana    Comment: daily    Family History  Problem Relation Age of Onset  . Diabetes Mother   . Hypertension Father   . Prostate cancer Father   . Alcoholism Brother   . Hypertension Brother     No Known Allergies  Review of Systems  Constitutional: Negative.   HENT:       + oral lesion  Genitourinary: Negative.       OBJECTIVE:    Vitals:   07/06/20 1057  BP: 131/81  Pulse: 72  Temp: 97.9 F (36.6 C)  TempSrc: Oral  SpO2: 99%  Weight: 133 lb (60.3 kg)  Height: 5\' 8"  (1.727 m)     Body mass index is 20.22 kg/m.  Physical Exam Constitutional:      General: He is not in acute distress.    Appearance: Normal appearance.  HENT:     Head: Normocephalic and atraumatic.     Mouth/Throat:     Comments: Lesion noted on tip of tongue and right side of gum line Neurological:     General: No focal deficit present.     Mental Status: He is alert and oriented to person, place, and time.  Psychiatric:        Mood and Affect: Mood normal.        Behavior: Behavior normal.      Labs and Microbiology:  CBC Latest Ref Rng & Units 05/19/2020 09/09/2019 11/27/2018  WBC 3.4 - 10.8 x10E3/uL 6.6 7.4 4.5  Hemoglobin 13.0 - 17.7 g/dL 12.6(L) 12.1(L) 12.5(L)  Hematocrit 37.5 - 51.0 % 37.8 36.7(L) 38.7  Platelets 150 - 450 x10E3/uL 249 360 222   CMP Latest Ref Rng & Units 05/19/2020 09/09/2019 11/27/2018  Glucose 65 - 99 mg/dL 518(A) 97 41(Y)  BUN 6 - 20 mg/dL 10 12 16   Creatinine 0.76 - 1.27 mg/dL 6.06 3.01  Sodium 134 - 144 mmol/L 138 137 139  Potassium 3.5 - 5.2 mmol/L 3.7 3.6 4.9  Chloride 96 - 106 mmol/L 97 106 105  CO2 20 - 29 mmol/L 26 23 30   Calcium 8.7 - 10.2 mg/dL 9.4 9.6 9.8  Total Protein 6.0 - 8.5 g/dL 7.4 6.01) 7.2  Total Bilirubin 0.0 - 1.2 mg/dL 0.3 0.5 0.5  Alkaline Phos 44 - 121 IU/L 71 - -  AST 0 - 40 IU/L 34 13 20  ALT 0 - 44 IU/L 24 9 13      No results found for this or  any previous visit (from the past 240 hour(s)).     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  Previous RPR 1:8 in October 2021.  PLAN: . Treat with Bicillin x 1 today . RPR . Follow up in 3 months   Routine screening for STI (sexually transmitted infection) Recent exposure to syphilis from new partner.    PLAN: . Will treat with azithromycin 1gm and CTX IM 500mg  and screen for GC/CT from urine, oral, and anal source     0.9(N for Infectious Disease [facility] 07/06/2020, 12:03 PM

## 2020-07-06 NOTE — Patient Instructions (Signed)
Thank you for coming to see me today. It was a pleasure seeing you.  To Do: Marland Kitchen Bicillin injection today . Labs and swabs . Will call you if any other positive results . Follow up with Dr Luciana Axe as scheduled.   If you have any questions or concerns, please do not hesitate to call the office at [phone].  Take Care,   Gwynn Burly, DO

## 2020-07-07 ENCOUNTER — Other Ambulatory Visit: Payer: Self-pay

## 2020-07-07 LAB — RPR: RPR Ser Ql: REACTIVE — AB

## 2020-07-07 LAB — CYTOLOGY, (ORAL, ANAL, URETHRAL) ANCILLARY ONLY
Chlamydia: NEGATIVE
Chlamydia: NEGATIVE
Comment: NEGATIVE
Comment: NEGATIVE
Comment: NORMAL
Comment: NORMAL
Neisseria Gonorrhea: NEGATIVE
Neisseria Gonorrhea: NEGATIVE

## 2020-07-07 LAB — RPR TITER: RPR Titer: 1:128 {titer} — ABNORMAL HIGH

## 2020-07-07 LAB — FLUORESCENT TREPONEMAL AB(FTA)-IGG-BLD: Fluorescent Treponemal ABS: REACTIVE — AB

## 2020-07-09 ENCOUNTER — Telehealth: Payer: Self-pay

## 2020-07-09 NOTE — Progress Notes (Signed)
Patient ID: Cory Russell, male   DOB: 10-07-1990, 30 y.o.   MRN: [id number] Called patient left message to call to see if he has ins   [facility] surgical spoke with Vicente Serene who advised they have called the patient multiple times and he has not returned their calls  They will still see him until October of 2022

## 2020-07-09 NOTE — Telephone Encounter (Signed)
RN spoke with patient to really that per Dr. Earlene Plater, his labs show a reinfection with syphilis, but that he was adequately treated during his visit. Also let the patient know that his rectal and oral swabs for gonorrhea and chlamydia were negative, but that we are still waiting on urine results. Advised patient that per Dr. Earlene Plater, he was treated for both gonorrhea and chlamydia at his last visit, but that we will let him know if his urine comes back positive. Patient verbalized understanding and has no further questions.   [name], RN

## 2020-07-09 NOTE — Telephone Encounter (Signed)
-----   Message from Kathlynn Grate, DO sent at 07/09/2020  9:43 AM EST ----- Can you please let patient know that his labs showed reinfection with syphilis but he was adequately treated during his visit.  Also, rectal and oral GC/CT were negative.  I am waiting on urine result and will update him if it is positive.  Regardless, he was also treated for both during visit just in case.  Greig Castilla

## 2020-07-13 ENCOUNTER — Ambulatory Visit: Payer: 59

## 2020-07-14 ENCOUNTER — Institutional Professional Consult (permissible substitution): Payer: Self-pay | Admitting: Behavioral Health

## 2020-07-14 ENCOUNTER — Telehealth: Payer: Self-pay | Admitting: Behavioral Health

## 2020-07-14 NOTE — Telephone Encounter (Signed)
Contacted Pt on both h & m phone several times ea. Lft msg on home phone after no answer. Unable to leave msg on mobile due to mailbox being full.  Dr. Monna Fam

## 2020-07-20 NOTE — Progress Notes (Signed)
Patient ID: Cory Russell, male   DOB: 1990-07-10, 30 y.o.   MRN: [id number] [facility] Surgical at [phone] spoke with Alissa  Who advised they called patient numerous times and he has not returned the call   Previously saw Dr Rubye Oaks on 02-14-19 They have closed referral but advised still good until 10-22 if patient wants to call

## 2020-09-14 ENCOUNTER — Ambulatory Visit: Payer: Self-pay | Admitting: Internal Medicine

## 2020-09-30 ENCOUNTER — Ambulatory Visit: Payer: 59 | Admitting: Internal Medicine

## 2020-10-27 ENCOUNTER — Other Ambulatory Visit: Payer: Self-pay

## 2020-10-27 DIAGNOSIS — B20 Human immunodeficiency virus [HIV] disease: Secondary | ICD-10-CM

## 2020-10-27 MED ORDER — BIKTARVY 50-200-25 MG PO TABS: 1.0000 | ORAL_TABLET | Freq: Every day | ORAL | 0 refills | Status: DC

## 2020-10-27 NOTE — Telephone Encounter (Signed)
Patient placed call to triage nurse requesting to reschedule previously missed appointment for labs and office visit with Dr. Luciana Axe from April of this year. Patient also requesting medication refill. Patient advised that x1 refill will be sent to preferred pharmacy. Call transferred to front office staff to schedule appointments. Valarie Cones

## 2020-10-28 ENCOUNTER — Other Ambulatory Visit: Payer: Self-pay

## 2020-10-28 ENCOUNTER — Other Ambulatory Visit: Payer: 59

## 2020-10-28 ENCOUNTER — Ambulatory Visit (INDEPENDENT_AMBULATORY_CARE_PROVIDER_SITE_OTHER): Payer: 59

## 2020-10-28 ENCOUNTER — Encounter: Payer: Self-pay | Admitting: Internal Medicine

## 2020-10-28 ENCOUNTER — Telehealth: Payer: Self-pay

## 2020-10-28 ENCOUNTER — Ambulatory Visit: Payer: 59

## 2020-10-28 ENCOUNTER — Other Ambulatory Visit (HOSPITAL_COMMUNITY)
Admission: RE | Admit: 2020-10-28 | Discharge: 2020-10-28 | Disposition: A | Discharge status: Home / Self Care | Payer: 59 | Source: Ambulatory Visit | Attending: Internal Medicine | Admitting: Internal Medicine

## 2020-10-28 DIAGNOSIS — Z113 Encounter for screening for infections with a predominantly sexual mode of transmission: Secondary | ICD-10-CM | POA: Diagnosis present

## 2020-10-28 DIAGNOSIS — B20 Human immunodeficiency virus [HIV] disease: Secondary | ICD-10-CM

## 2020-10-28 DIAGNOSIS — Z79899 Other long term (current) drug therapy: Secondary | ICD-10-CM

## 2020-10-28 DIAGNOSIS — A539 Syphilis, unspecified: Secondary | ICD-10-CM | POA: Diagnosis not present

## 2020-10-28 MED ORDER — PENICILLIN G BENZATHINE 1200000 UNIT/2ML IM SUSY
1.2000 10*6.[IU] | PREFILLED_SYRINGE | Freq: Once | INTRAMUSCULAR | Status: AC
  Administered 2020-10-28: 1.2 10*6.[IU] via INTRAMUSCULAR

## 2020-10-28 NOTE — Progress Notes (Signed)
Patient walked in office today for labs and reported he was in contact with someone who tested positive for syphilis. Per Dr. Earlene Plater ok to treat patient for syphilis with 2.4 MU of Bicillin. Patient tolerated well. Patient advised to abstain from sex for 10 days and make sure his partner is treated. Patient verbalized understanding. Shey Yott [name]

## 2020-10-28 NOTE — Telephone Encounter (Signed)
Patient came in office today for labs and reported he had been in contact with someone with syphilis 3 weeks ago. Patient reports having anal pain today. Patient was last treated in January 2022 for syphilis.   I asked patient was his partner treated when he tested positive in January and patient reported he was unsure and that this time it was a new partner.  Per Dr. Earlene Plater ok to treat patient today without being seen with 2.4 MU of Bicillin by a provider. Patient tested today for syphilis as well. Patient reported he would not be able to return to the clinic if results came back positive because he will be out of town for a while.  Patient treated today in office with 2.4 MU of bicillin. Patient tolerated well. Patient advised no sex for 10 days and make sure his partner is treated. Patient verbalized understanding. Makaiya Geerdes [name]

## 2020-10-29 LAB — T-HELPER CELL (CD4) - (RCID CLINIC ONLY)
CD4 % Helper T Cell: 13 % — ABNORMAL LOW (ref 33–65)
CD4 T Cell Abs: 310 /uL — ABNORMAL LOW (ref 400–1790)

## 2020-10-29 LAB — URINE CYTOLOGY ANCILLARY ONLY
Chlamydia: NEGATIVE
Comment: NEGATIVE
Comment: NORMAL
Neisseria Gonorrhea: NEGATIVE

## 2020-10-31 LAB — COMPREHENSIVE METABOLIC PANEL
AG Ratio: 1.5 (calc) (ref 1.0–2.5)
ALT: 17 U/L (ref 9–46)
AST: 20 U/L (ref 10–40)
Albumin: 4.6 g/dL (ref 3.6–5.1)
Alkaline phosphatase (APISO): 58 U/L (ref 36–130)
BUN: 19 mg/dL (ref 7–25)
CO2: 30 mmol/L (ref 20–32)
Calcium: 9.5 mg/dL (ref 8.6–10.3)
Chloride: 106 mmol/L (ref 98–110)
Creat: 1.08 mg/dL (ref 0.60–1.35)
Globulin: 3.1 g/dL (calc) (ref 1.9–3.7)
Glucose, Bld: 83 mg/dL (ref 65–99)
Potassium: 4 mmol/L (ref 3.5–5.3)
Sodium: 140 mmol/L (ref 135–146)
Total Bilirubin: 0.5 mg/dL (ref 0.2–1.2)
Total Protein: 7.7 g/dL (ref 6.1–8.1)

## 2020-10-31 LAB — CBC WITH DIFFERENTIAL/PLATELET
Absolute Monocytes: 525 cells/uL (ref 200–950)
Basophils Absolute: 8 cells/uL (ref 0–200)
Basophils Relative: 0.2 %
Eosinophils Absolute: 21 cells/uL (ref 15–500)
Eosinophils Relative: 0.5 %
HCT: 42.9 % (ref 38.5–50.0)
Hemoglobin: 13.7 g/dL (ref 13.2–17.1)
Lymphs Abs: 2512 cells/uL (ref 850–3900)
MCH: 28.7 pg (ref 27.0–33.0)
MCHC: 31.9 g/dL — ABNORMAL LOW (ref 32.0–36.0)
MCV: 89.7 fL (ref 80.0–100.0)
MPV: 9.2 fL (ref 7.5–12.5)
Monocytes Relative: 12.5 %
Neutro Abs: 1134 cells/uL — ABNORMAL LOW (ref 1500–7800)
Neutrophils Relative %: 27 %
Platelets: 234 10*3/uL (ref 140–400)
RBC: 4.78 10*6/uL (ref 4.20–5.80)
RDW: 13.7 % (ref 11.0–15.0)
Total Lymphocyte: 59.8 %
WBC: 4.2 10*3/uL (ref 3.8–10.8)

## 2020-10-31 LAB — LIPID PANEL
Cholesterol: 158 mg/dL (ref <200)
HDL: 47 mg/dL (ref >=40)
LDL Cholesterol (Calc): 94 mg/dL (calc)
Non-HDL Cholesterol (Calc): 111 mg/dL (calc) (ref <130)
Total CHOL/HDL Ratio: 3.4 (calc) (ref <5.0)
Triglycerides: 79 mg/dL (ref <150)

## 2020-10-31 LAB — HIV-1 RNA QUANT-NO REFLEX-BLD
HIV 1 RNA Quant: 529 Copies/mL — ABNORMAL HIGH
HIV-1 RNA Quant, Log: 2.72 Log cps/mL — ABNORMAL HIGH

## 2020-10-31 LAB — RPR: RPR Ser Ql: REACTIVE — AB

## 2020-10-31 LAB — FLUORESCENT TREPONEMAL AB(FTA)-IGG-BLD: Fluorescent Treponemal ABS: REACTIVE — AB

## 2020-10-31 LAB — RPR TITER: RPR Titer: 1:16 {titer} — ABNORMAL HIGH

## 2020-11-11 ENCOUNTER — Ambulatory Visit: Payer: 59 | Admitting: Internal Medicine

## 2020-11-11 NOTE — Progress Notes (Deleted)
[facility]   CHIEF COMPLAINT    HIV follow up.   SUBJECTIVE:    Cory Russell is a 30 y.o. male with PMHx as below who presents to the clinic for HIV follow up.   Presents today for HIV follow up. Previously seen by me in January as same day visit for syphilis infection.  Treated with Bicillin x 1 and RPR titer was 1:128.  His GC/CT testing was negative but he was empirically treated. Presented 10/28/20 as a walk in for STI exposure again.  Was treated empirically for syphilis as he reported exposure.  RPR titer was 1:16.  Urine GC/CT was negative.  Patient has missed other follow up appointments with his routine HIV provider, Dr Linus Salmons, and presents today for follow up with me.  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   Other labs in May 25: CD4 310, 13%, viral load 529 copies.  Please see A&P for the details of today's visit and status of the patient's medical problems.   Patient's Medications  New Prescriptions   No medications on file  Previous Medications   BICTEGRAVIR-EMTRICITABINE-TENOFOVIR AF (BIKTARVY) 50-200-25 MG TABS TABLET    Take 1 tablet by mouth daily.   ESCITALOPRAM (LEXAPRO) 10 MG TABLET    Take 1 tablet (10 mg total) by mouth daily.   ESCITALOPRAM (LEXAPRO) 10 MG TABLET    TAKE 1 TABLET (10 MG TOTAL) BY MOUTH DAILY.   MEGESTROL (MEGACE ES) 625 MG/5ML SUSPENSION    SHAKE LIQUID AND TAKE 5 ML(625 MG) BY MOUTH DAILY   MIRTAZAPINE (REMERON) 15 MG TABLET    TAKE 1 TABLET (15 MG TOTAL) BY MOUTH AT BEDTIME.  Modified Medications   No medications on file  Discontinued Medications   No medications on file      Past Medical History:  Diagnosis Date  . Depression 03/19/2020   Started on Rameron on 03/19/20  . Diabetes mellitus without complication ([place])   . HIV (human immunodeficiency virus infection) ([place])   . Hypertension     Social  History   Tobacco Use  . Smoking status: Never Smoker  . Smokeless tobacco: Never Used  Substance Use Topics  . Alcohol use: Yes    Alcohol/week: 0.0 standard drinks    Comment: occasional  . Drug use: Yes    Types: Marijuana    Comment: daily    Family History  Problem Relation Age of Onset  . Diabetes Mother   . Hypertension Father   . Prostate cancer Father   . Alcoholism Brother   . Hypertension Brother     No Known Allergies  ROS   OBJECTIVE:    There were no vitals filed for this visit.   There is no height or weight on file to calculate BMI.  Physical Exam  Labs and Microbiology: CMP Latest Ref Rng & Units 10/28/2020 05/19/2020 09/09/2019  Glucose 65 - 99 mg/dL 83 104(H) 97  BUN 7 - 25 mg/dL '19 10 12  ' Creatinine 0.60 - 1.35 mg/dL 1.08 0.93 0.80  Sodium 135 - 146 mmol/L 140 138 137  Potassium 3.5 - 5.3 mmol/L 4.0 3.7 3.6  Chloride 98 - 110 mmol/L 106 97 106  CO2 20 - 32 mmol/L '30 26 23  ' Calcium 8.6 - 10.3 mg/dL 9.5 9.4 9.6  Total Protein 6.1 - 8.1 g/dL 7.7  7.4 8.2(H)  Total Bilirubin 0.2 - 1.2 mg/dL 0.5 0.3 0.5  Alkaline Phos 44 - 121 IU/L - 71 -  AST 10 - 40 U/L 20 34 13  ALT 9 - 46 U/L '17 24 9   ' CBC Latest Ref Rng & Units 10/28/2020 05/19/2020 09/09/2019  WBC 3.8 - 10.8 Thousand/uL 4.2 6.6 7.4  Hemoglobin 13.2 - 17.1 g/dL 13.7 12.6(L) 12.1(L)  Hematocrit 38.5 - 50.0 % 42.9 37.8 36.7(L)  Platelets 140 - 400 Thousand/uL 234 249 360     Lab Results  Component Value Date   HIV1RNAQUANT 529 (H) 10/28/2020   HIV1RNAQUANT 3,150 (H) 03/19/2020   HIV1RNAQUANT 4,220 (H) 03/19/2020   CD4TABS 310 (L) 10/28/2020   CD4TABS 339 (L) 03/19/2020   CD4TABS 359 (L) 09/09/2019    RPR and STI: Lab Results  Component Value Date   LABRPR REACTIVE (A) 10/28/2020   LABRPR REACTIVE (A) 07/06/2020   LABRPR REACTIVE (A) 03/19/2020   LABRPR REACTIVE (A) 09/09/2019   LABRPR REACTIVE (A) 11/27/2018   RPRTITER 1:16 (H) 10/28/2020   RPRTITER 1:128 (H) 07/06/2020    RPRTITER 1:8 (H) 03/19/2020   RPRTITER 1:128 (H) 09/09/2019   RPRTITER 1:2 (H) 11/27/2018    STI Results GC GC CT CT  Latest Ref Rng & Units - NEGATIVE - NEGATIVE  10/28/2020 Negative - Negative -  07/06/2020 Negative - Negative -  07/06/2020 Negative - Negative -  03/19/2020 Negative - Negative -  03/19/2020 Negative - Negative -  03/19/2020 Negative - Negative -  09/30/2019 Negative - Negative -  09/30/2019 Positive(A) - Negative -  09/09/2019 Negative - Negative -  11/27/2018 Negative - Negative -  09/24/2018 Negative - Negative -  05/07/2016 Negative - Negative -  01/26/2016 Negative - Negative -  08/28/2015 Negative - Negative -  04/29/2015 Negative - Negative -  01/12/2015 Negative - Negative -  10/23/2014 Negative - Negative -  08/08/2013 NG: Negative NEGATIVE CT: Negative NEGATIVE    Hepatitis B: Lab Results  Component Value Date   HEPBSAB POS (A) 12/02/2013   HEPBSAG NEGATIVE 12/02/2013   HEPBCAB NON REACTIVE 12/02/2013   Hepatitis C: No results found for: HEPCAB, HCVRNAPCRQN Hepatitis A: Lab Results  Component Value Date   HAV REACTIVE (A) 12/02/2013   Lipids: Lab Results  Component Value Date   CHOL 158 10/28/2020   TRIG 79 10/28/2020   HDL 47 10/28/2020   CHOLHDL 3.4 10/28/2020   VLDL 17 01/26/2016   LDLCALC 94 10/28/2020    Imaging: ***   ASSESSMENT & PLAN:    No problem-specific Assessment & Plan notes found for this encounter.   No orders of the defined types were placed in this encounter.      *** Vaccines Influenza: give every year COVID: recommend vaccination if not already done Pneumovax-23: (if CD4 >200) give twice every 5 years apart before age 46, then once at age 77.  Give >8 weeks from [place] Prevnar-13: (preferably when CD4 >200) give once, give >1 year from last Pneumovax-23 Hepatitis A: give Havrix 2 dose series at 0 and 6-12 months if non-immune Hepatitis B: give Heplisav 2 dose series at 0 and 4 weeks if non-immune.  Repeat  serology 2 months after vaccine and revaccinate if needed MenACWY: 2 dose primary series 8 weeks apart, then 1 dose booster every 5 years HPV: Gardasil-9 at 0, 2, and 6 months for ages 9-26 should be vaccinated.  Ages 71-45 should be offered if appropriate Tdap: give every 10 years Shingles:  give Shingrix 2 dose series at 0 and 2-6 months if >50 years on ART with CD4 cell count >200 Varicella: primary vaccination may be considered in VZV seronegative persons aged >8 years (if CD4 >200)  MMR: vaccine should be given if born in 1958 or after and do not have immunity (if CD4 >200)  Screening DEXA Scan: if age >23 Quantiferon: check at initiation of care Hepatitis C: check at initiation of care.  Screen annually if risk factors HLA B5701: check at initiation of care G6PD: check if starting therapy with oxidant drugs Lipids: check annually Urinalysis: check annually or every 6 months if on tenofovir Hgb A1c: check annually  ASCVD Risk Score Consider high-intensity statin therapy if 10-year ASCVD risk score >7.5% The ASCVD Risk score Mikey Bussing DC Jr., et al., 2013) failed to calculate for the following reasons:   The 2013 ASCVD risk score is only valid for ages 51 to [place] [facility] [place] 11/11/2020, 6:31 AM  HIV: ***  Vaccines: ***  STI: ***

## 2020-11-12 ENCOUNTER — Telehealth: Payer: Self-pay

## 2020-11-12 NOTE — Telephone Encounter (Signed)
Patient called office and spoke with front staff in regards to rescheduling his appointment. Patient stated he need to check his work schedule and he will then call back to make an appointment. [name] [name]

## 2020-11-12 NOTE — Telephone Encounter (Signed)
-----   Message from Kathlynn Grate, DO sent at 11/12/2020 11:24 AM EDT ----- Regarding: Needs appt Hi,  This pt was on my schedule yesterday but cancelled.  Can we try to get him rescheduled?  I saw him x 1 in January as a walk in and he has previously seen Dr Luciana Axe.  Follow up with either of [place] would probably be appropriate.  Thanks, Greig Castilla

## 2020-11-12 NOTE — Telephone Encounter (Signed)
I attempted to contact the patient to reschedule him. Patient does not have a secured voicemail setup. No message left and mychart message sent to have him call to reschedule.  Cory Russell [name]

## 2020-11-16 ENCOUNTER — Other Ambulatory Visit: Payer: Self-pay | Admitting: Family

## 2020-11-16 DIAGNOSIS — B20 Human immunodeficiency virus [HIV] disease: Secondary | ICD-10-CM

## 2020-11-16 NOTE — Progress Notes (Unsigned)
m °

## 2020-12-08 ENCOUNTER — Encounter: Payer: Self-pay | Admitting: *Deleted

## 2021-03-03 ENCOUNTER — Other Ambulatory Visit (HOSPITAL_COMMUNITY): Payer: Self-pay

## 2021-04-07 ENCOUNTER — Other Ambulatory Visit: Payer: Self-pay

## 2021-04-07 ENCOUNTER — Other Ambulatory Visit (HOSPITAL_COMMUNITY)
Admission: RE | Admit: 2021-04-07 | Discharge: 2021-04-07 | Disposition: A | Discharge status: Home / Self Care | Payer: 59 | Source: Ambulatory Visit | Attending: Internal Medicine | Admitting: Internal Medicine

## 2021-04-07 ENCOUNTER — Ambulatory Visit (INDEPENDENT_AMBULATORY_CARE_PROVIDER_SITE_OTHER): Payer: 59 | Admitting: Pharmacist

## 2021-04-07 DIAGNOSIS — B20 Human immunodeficiency virus [HIV] disease: Secondary | ICD-10-CM

## 2021-04-07 DIAGNOSIS — Z23 Encounter for immunization: Secondary | ICD-10-CM | POA: Diagnosis not present

## 2021-04-07 DIAGNOSIS — A630 Anogenital (venereal) warts: Secondary | ICD-10-CM

## 2021-04-07 MED ORDER — BICTEGRAVIR-EMTRICITAB-TENOFOV 50-200-25 MG PO TABS: 1.0000 | ORAL_TABLET | Freq: Every day | ORAL | 2 refills | Status: DC

## 2021-04-07 NOTE — Progress Notes (Signed)
HPI: Cory Russell is a 30 y.o. male who presents to the [facility] pharmacy clinic for HIV follow-up.  Patient Active Problem List   Diagnosis Date Noted   HIV (human immunodeficiency virus infection) (HCC)    Hypertension    Rectal mass 09/30/2019   Syphilis 09/30/2019   Hemorrhoids 01/23/2019   Routine screening for STI (sexually transmitted infection) 11/27/2018   Healthcare maintenance 06/25/2018   Depression 01/26/2015   Encounter for long-term (current) use of medications 08/28/2014   Poor appetite 08/28/2014   Human immunodeficiency virus (HIV) disease (HCC) 12/20/2013    Patient's Medications  New Prescriptions   No medications on file  Previous Medications   BICTEGRAVIR-EMTRICITABINE-TENOFOVIR AF (BIKTARVY) 50-200-25 MG TABS TABLET    Take 1 tablet by mouth daily.   ESCITALOPRAM (LEXAPRO) 10 MG TABLET    Take 1 tablet (10 mg total) by mouth daily.   ESCITALOPRAM (LEXAPRO) 10 MG TABLET    TAKE 1 TABLET (10 MG TOTAL) BY MOUTH DAILY.   MEGESTROL (MEGACE ES) 625 MG/5ML SUSPENSION    SHAKE LIQUID AND TAKE 5 ML(625 MG) BY MOUTH DAILY   MIRTAZAPINE (REMERON) 15 MG TABLET    TAKE 1 TABLET (15 MG TOTAL) BY MOUTH AT BEDTIME.  Modified Medications   No medications on file  Discontinued Medications   No medications on file    Allergies: No Known Allergies  Past Medical History: Past Medical History:  Diagnosis Date   Depression 03/19/2020   Started on Rameron on 03/19/20   Diabetes mellitus without complication (HCC)    HIV (human immunodeficiency virus infection) (HCC)    Hypertension     Social History: Social History   Socioeconomic History   Marital status: Single    Spouse name: Not on file   Number of children: 0   Years of education: Not on file   Highest education level: Bachelor's degree (e.g., BA, AB, BS)  Occupational History   Not on file  Tobacco Use   Smoking status: Never   Smokeless tobacco: Never  Substance and Sexual Activity   Alcohol use:  Yes    Alcohol/week: 0.0 standard drinks    Comment: occasional   Drug use: Yes    Types: Marijuana    Comment: daily   Sexual activity: Yes    Partners: Male    Comment: declined condoms  Other Topics Concern   Not on file  Social History Narrative   Not on file   Social Determinants of Health   Financial Resource Strain: Not on file  Food Insecurity: Not on file  Transportation Needs: Not on file  Physical Activity: Not on file  Stress: Not on file  Social Connections: Not on file    Labs: Lab Results  Component Value Date   HIV1RNAQUANT 529 (H) 10/28/2020   HIV1RNAQUANT 3,150 (H) 03/19/2020   HIV1RNAQUANT 4,220 (H) 03/19/2020   CD4TABS 310 (L) 10/28/2020   CD4TABS 339 (L) 03/19/2020   CD4TABS 359 (L) 09/09/2019    RPR and STI Lab Results  Component Value Date   LABRPR REACTIVE (A) 10/28/2020   LABRPR REACTIVE (A) 07/06/2020   LABRPR REACTIVE (A) 03/19/2020   LABRPR REACTIVE (A) 09/09/2019   LABRPR REACTIVE (A) 11/27/2018   RPRTITER 1:16 (H) 10/28/2020   RPRTITER 1:128 (H) 07/06/2020   RPRTITER 1:8 (H) 03/19/2020   RPRTITER 1:128 (H) 09/09/2019   RPRTITER 1:2 (H) 11/27/2018    STI Results GC GC CT CT  Latest Ref Rng & Units - NEGATIVE -  NEGATIVE  10/28/2020 Negative - Negative -  07/06/2020 Negative - Negative -  07/06/2020 Negative - Negative -  03/19/2020 Negative - Negative -  03/19/2020 Negative - Negative -  03/19/2020 Negative - Negative -  09/30/2019 Negative - Negative -  09/30/2019 Positive(A) - Negative -  09/09/2019 Negative - Negative -  11/27/2018 Negative - Negative -  09/24/2018 Negative - Negative -  05/07/2016 Negative - Negative -  01/26/2016 Negative - Negative -  08/28/2015 Negative - Negative -  04/29/2015 Negative - Negative -  01/12/2015 Negative - Negative -  10/23/2014 Negative - Negative -  08/08/2013 NG: Negative NEGATIVE CT: Negative NEGATIVE    Hepatitis B Lab Results  Component Value Date   HEPBSAB POS (A) 12/02/2013   HEPBSAG  NEGATIVE 12/02/2013   HEPBCAB NON REACTIVE 12/02/2013   Hepatitis C No results found for: HEPCAB, HCVRNAPCRQN Hepatitis A Lab Results  Component Value Date   HAV REACTIVE (A) 12/02/2013   Lipids: Lab Results  Component Value Date   CHOL 158 10/28/2020   TRIG 79 10/28/2020   HDL 47 10/28/2020   CHOLHDL 3.4 10/28/2020   VLDL 17 01/26/2016   LDLCALC 94 10/28/2020    Current HIV Regimen: Biktarvy  Assessment: Cory Russell presents to clinic today for HIV follow-up. His last HIV RNA was 210 in May and has fluctuated mostly staying detectable with a CD4 310. He states he has been out of medicine for 2 weeks. Patient would like Biktarvy refill sent to [facility] on [address].Will check HIV RNA with genotype, CD4, Bmet, CBC, lipid panel, RPR, and urine/oral/rectal cytologies. Follow-up with Dr. Luciana Axe in 1 month to assess adherence and any issues with pharmacy as well as address some of his complications discussed below.  States he has been experiencing increased body aches, headaches, and night sweats since off the Biktarvy. He states he has experienced all of these symptoms consistently since being diagnosed with HIV over 10 years ago. The headaches are generally behind one eye, and he may experience dizziness with them as well. They seem to improve on their own without anything making them better or worse. Discussed that his CD4 count is above 200 thus he is likely not presenting with any opportunistic infection. Informed him that I am unable to prescribe any further medication for migraines or pain, but this is something he can discuss further with Dr. Luciana Axe at his follow-up visit.   Patient also with history of HPV and treatment as well as abnormal rectal screening. Previously referred to surgery in [facility] for biopsy/testing but was denied due to ADAP. Patient now insured with [facility] and requested another referral; sending referral to [facility].   Patient due for annual flu  vaccine and updated COVID booster; both administered in clinic today.   Plan: Refill Biktarvy x 3 months Check HIV RNA with genotype, CD4, Bmet, CBC, lipid panel, RPR, and urine/oral/rectal cytologies Administer flu and COVID vaccine Follow-up with Dr. Luciana Axe on 12/5 at 2:30pm  Margarite Gouge, PharmD, CPP Clinical Pharmacist Practitioner Infectious Diseases Clinical Pharmacist [facility] 04/07/2021, 2:27 PM

## 2021-04-07 NOTE — Patient Instructions (Signed)
[facility] [address], [place], [place] [address] [phone] 

## 2021-04-08 LAB — T-HELPER CELLS (CD4) COUNT (NOT AT ARMC)
CD4 % Helper T Cell: 12 % — ABNORMAL LOW (ref 33–65)
CD4 T Cell Abs: 285 /uL — ABNORMAL LOW (ref 400–1790)

## 2021-04-08 LAB — CYTOLOGY, (ORAL, ANAL, URETHRAL) ANCILLARY ONLY
Chlamydia: NEGATIVE
Chlamydia: NEGATIVE
Comment: NEGATIVE
Comment: NEGATIVE
Comment: NORMAL
Comment: NORMAL
Neisseria Gonorrhea: NEGATIVE
Neisseria Gonorrhea: NEGATIVE

## 2021-04-08 LAB — URINE CYTOLOGY ANCILLARY ONLY
Chlamydia: NEGATIVE
Comment: NEGATIVE
Comment: NORMAL
Neisseria Gonorrhea: NEGATIVE

## 2021-04-08 NOTE — Addendum Note (Signed)
Addended by: Jennette Kettle on: 04/08/2021 11:06 AM   Modules accepted: Orders

## 2021-04-16 LAB — COMPREHENSIVE METABOLIC PANEL
AG Ratio: 1.4 (calc) (ref 1.0–2.5)
ALT: 19 U/L (ref 9–46)
AST: 22 U/L (ref 10–40)
Albumin: 4.4 g/dL (ref 3.6–5.1)
Alkaline phosphatase (APISO): 61 U/L (ref 36–130)
BUN: 12 mg/dL (ref 7–25)
CO2: 30 mmol/L (ref 20–32)
Calcium: 9.6 mg/dL (ref 8.6–10.3)
Chloride: 103 mmol/L (ref 98–110)
Creat: 1.09 mg/dL (ref 0.60–1.26)
Globulin: 3.1 g/dL (calc) (ref 1.9–3.7)
Glucose, Bld: 83 mg/dL (ref 65–99)
Potassium: 4.4 mmol/L (ref 3.5–5.3)
Sodium: 139 mmol/L (ref 135–146)
Total Bilirubin: 0.5 mg/dL (ref 0.2–1.2)
Total Protein: 7.5 g/dL (ref 6.1–8.1)

## 2021-04-16 LAB — RPR TITER: RPR Titer: 1:8 {titer} — ABNORMAL HIGH

## 2021-04-16 LAB — RPR: RPR Ser Ql: REACTIVE — AB

## 2021-04-16 LAB — HIV-1 INTEGRASE GENOTYPE

## 2021-04-16 LAB — HIV-1 GENOTYPE: HIV-1 Genotype: DETECTED — AB

## 2021-04-16 LAB — LIPID PANEL
Cholesterol: 147 mg/dL (ref <200)
HDL: 45 mg/dL (ref >=40)
LDL Cholesterol (Calc): 88 mg/dL (calc)
Non-HDL Cholesterol (Calc): 102 mg/dL (calc) (ref <130)
Total CHOL/HDL Ratio: 3.3 (calc) (ref <5.0)
Triglycerides: 46 mg/dL (ref <150)

## 2021-04-16 LAB — HIV RNA, RTPCR W/R GT (RTI, PI,INT)
HIV 1 RNA Quant: 1640 copies/mL — ABNORMAL HIGH
HIV-1 RNA Quant, Log: 3.21 Log copies/mL — ABNORMAL HIGH

## 2021-04-16 LAB — FLUORESCENT TREPONEMAL AB(FTA)-IGG-BLD: Fluorescent Treponemal ABS: REACTIVE — AB

## 2021-04-19 ENCOUNTER — Telehealth: Payer: Self-pay

## 2021-04-19 ENCOUNTER — Other Ambulatory Visit (HOSPITAL_COMMUNITY): Payer: Self-pay

## 2021-04-19 ENCOUNTER — Telehealth: Payer: Self-pay | Admitting: Pharmacist

## 2021-04-19 NOTE — Telephone Encounter (Signed)
[facility] Patient Advocate Encounter  Prior Authorization for Prezcobix has been approved.    PA# [id number] Effective dates: 04/19/21 through 04/19/22  Patients co-pay is $659.51.   I was able to get a [place] copay card to make the copay $0.00.       [facility] Clinic will continue to follow.  Clearance Coots, CPhT Specialty Pharmacy Patient [facility] for Infectious Disease Phone: [phone] Fax:  [phone]

## 2021-04-19 NOTE — Telephone Encounter (Signed)
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   Interpretation of Genotype Data per [employer] HIV Drug Resistance Database:  Nucleoside RTIs  Abacavir - High-level resistance Zidovudine - Intermediate resistance Emtricitabine - High-level resistance Lamivudine - High-level resistance Tenofovir - Low-level resistance   Non-Nucleoside RTIs  Doravirine - Potential low-level resistance Efavirenz - Intermediate resistance Etravirine - Intermediate resistance Nevirapine - High-level resistance Rilpivirine - Intermediate resistance   Protease Inhibitors  Atazanavir - Susceptible Darunavir - Susceptible Lopinavir - Susceptible   Integrase Inhibitors  Bictegravir - Susceptible Cabotegravir - Potential low-level resistance Dolutegravir - Susceptible Elvitegravir - High-level resistance Raltegravir - Low-level resistance   Patient currently receiving Biktarvy which provides incomplete coverage for HIV. Will add Prezcobix for additional coverage. Pending PA. LVM with patient's home number to call back about lab results. To follow-up with Dr. Luciana Axe in December.  Margarite Gouge, PharmD, CPP Clinical Pharmacist Practitioner Infectious Diseases Clinical Pharmacist [facility] for Infectious Disease

## 2021-04-19 NOTE — Telephone Encounter (Signed)
[facility] Patient Advocate Encounter   Received notification from [employer] that prior authorization for Prezcobix is required.   PA submitted on 04/19/21 Key [id number] Status is pending    [facility] Clinic will continue to follow.   Clearance Coots, CPhT Specialty Pharmacy Patient [facility] for Infectious Disease Phone: [phone] Fax:  [phone]

## 2021-04-20 ENCOUNTER — Telehealth: Payer: Self-pay | Admitting: Pharmacist

## 2021-04-20 ENCOUNTER — Other Ambulatory Visit: Payer: Self-pay | Admitting: Pharmacist

## 2021-04-20 DIAGNOSIS — B20 Human immunodeficiency virus [HIV] disease: Secondary | ICD-10-CM

## 2021-04-20 MED ORDER — PREZCOBIX 800-150 MG PO TABS: 1.0000 | ORAL_TABLET | Freq: Every day | ORAL | 2 refills | Status: DC

## 2021-04-20 NOTE — Progress Notes (Signed)
Adding Prezcobix to patient's current regimen (Biktarvy) due to newly developed resistance from non-adherence.  Margarite Gouge, PharmD, CPP Clinical Pharmacist Practitioner Infectious Diseases Clinical Pharmacist [facility] for Infectious Disease

## 2021-04-20 NOTE — Telephone Encounter (Signed)
Discussed new HIV resistance with Cory Russell today and reviewed adding Prezcobix to his current HIV regimen (Biktarvy). Emphasized the importance of adherence and need to take Prezcobix with Biktarvy always. Reviewed that if he only takes one or the other, the risk of resistance increases. He has taken Prezcobix before, but counseled that it is a once daily medicine he should take with food. Prescription sent to [facility] on [address]. He will follow-up with Dr. Luciana Axe on 12/5.  Margarite Gouge, PharmD, CPP Clinical Pharmacist Practitioner Infectious Diseases Clinical Pharmacist [facility] for Infectious Disease

## 2021-05-10 ENCOUNTER — Other Ambulatory Visit: Payer: Self-pay

## 2021-05-10 ENCOUNTER — Ambulatory Visit (INDEPENDENT_AMBULATORY_CARE_PROVIDER_SITE_OTHER): Payer: 59 | Admitting: Internal Medicine

## 2021-05-10 ENCOUNTER — Encounter: Payer: Self-pay | Admitting: Internal Medicine

## 2021-05-10 VITALS — BP 118/74 | HR 60 | Temp 98.2°F | Wt 125.0 lb

## 2021-05-10 DIAGNOSIS — B20 Human immunodeficiency virus [HIV] disease: Secondary | ICD-10-CM

## 2021-05-10 DIAGNOSIS — I10 Essential (primary) hypertension: Secondary | ICD-10-CM | POA: Diagnosis not present

## 2021-05-10 DIAGNOSIS — K6289 Other specified diseases of anus and rectum: Secondary | ICD-10-CM | POA: Diagnosis not present

## 2021-05-10 MED ORDER — ELVITEG-COBIC-EMTRICIT-TENOFAF 150-150-200-10 MG PO TABS: 1.0000 | ORAL_TABLET | Freq: Every day | ORAL | 11 refills | Status: DC

## 2021-05-10 MED ORDER — DARUNAVIR 800 MG PO TABS: 800.0000 mg | ORAL_TABLET | Freq: Every day | ORAL | 11 refills | Status: DC

## 2021-05-10 NOTE — Assessment & Plan Note (Signed)
Blood pressure stable today

## 2021-05-10 NOTE — Progress Notes (Signed)
   Subjective:    Patient ID: Cory Russell, male    DOB: 1990-10-18, 30 y.o.   MRN: [id number]  HPI Here for follow up of HIV He has been on Biktarvy and Prezcobix was added last visit with pharmacy due to resistance.  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  He had been on Biktarvy alone but has had sporadic follow up and more detectable virus so Prezcobix was added and he is here today for follow up.  He is asking about a different pill due to the size of Prezcobix.  He has an appointment with [facility] in January regarding his rectal mass.     Review of Systems  Constitutional:  Negative for fatigue.  Gastrointestinal:  Negative for diarrhea and nausea.  Skin:  Negative for rash.      Objective:   Physical Exam Eyes:     General: No scleral icterus. Pulmonary:     Effort: Pulmonary effort is normal.  Skin:    Findings: No rash.  Neurological:     General: No focal deficit present.     Mental Status: He is alert.  Psychiatric:        Mood and Affect: Mood normal.   SH: no tobacco       Assessment & Plan:

## 2021-05-10 NOTE — Assessment & Plan Note (Signed)
He has an appointment now with [facility] and I emphasized the need for him to keep this.  I will check an updated Hgb to be sure he has not lost too much blood.

## 2021-05-10 NOTE — Assessment & Plan Note (Signed)
He is doing well but struggles a bit with the pill size.  I discussed options and will change him to [facility] and prezista instead and see if that is a better fit for him.  I otherwise will check his labs today and if ok, he will return in about 2 months.

## 2021-05-11 LAB — T-HELPER CELL (CD4) - (RCID CLINIC ONLY)
CD4 % Helper T Cell: 12 % — ABNORMAL LOW (ref 33–65)
CD4 T Cell Abs: 302 /uL — ABNORMAL LOW (ref 400–1790)

## 2021-05-13 LAB — COMPLETE METABOLIC PANEL WITH GFR
AG Ratio: 1.3 (calc) (ref 1.0–2.5)
ALT: 19 U/L (ref 9–46)
AST: 20 U/L (ref 10–40)
Albumin: 4.2 g/dL (ref 3.6–5.1)
Alkaline phosphatase (APISO): 62 U/L (ref 36–130)
BUN: 14 mg/dL (ref 7–25)
CO2: 29 mmol/L (ref 20–32)
Calcium: 9.5 mg/dL (ref 8.6–10.3)
Chloride: 106 mmol/L (ref 98–110)
Creat: 0.96 mg/dL (ref 0.60–1.26)
Globulin: 3.2 g/dL (calc) (ref 1.9–3.7)
Glucose, Bld: 83 mg/dL (ref 65–99)
Potassium: 4.6 mmol/L (ref 3.5–5.3)
Sodium: 141 mmol/L (ref 135–146)
Total Bilirubin: 0.7 mg/dL (ref 0.2–1.2)
Total Protein: 7.4 g/dL (ref 6.1–8.1)
eGFR: 108 mL/min/{1.73_m2} (ref >=60)

## 2021-05-13 LAB — CBC WITH DIFFERENTIAL/PLATELET
Absolute Monocytes: 550 cells/uL (ref 200–950)
Basophils Absolute: 20 cells/uL (ref 0–200)
Basophils Relative: 0.4 %
Eosinophils Absolute: 40 cells/uL (ref 15–500)
Eosinophils Relative: 0.8 %
HCT: 38.8 % (ref 38.5–50.0)
Hemoglobin: 12.8 g/dL — ABNORMAL LOW (ref 13.2–17.1)
Lymphs Abs: 2640 cells/uL (ref 850–3900)
MCH: 29.8 pg (ref 27.0–33.0)
MCHC: 33 g/dL (ref 32.0–36.0)
MCV: 90.2 fL (ref 80.0–100.0)
MPV: 8.9 fL (ref 7.5–12.5)
Monocytes Relative: 11 %
Neutro Abs: 1750 cells/uL (ref 1500–7800)
Neutrophils Relative %: 35 %
Platelets: 254 10*3/uL (ref 140–400)
RBC: 4.3 10*6/uL (ref 4.20–5.80)
RDW: 14 % (ref 11.0–15.0)
Total Lymphocyte: 52.8 %
WBC: 5 10*3/uL (ref 3.8–10.8)

## 2021-05-13 LAB — HIV-1 RNA QUANT-NO REFLEX-BLD
HIV 1 RNA Quant: 401 Copies/mL — ABNORMAL HIGH
HIV-1 RNA Quant, Log: 2.6 Log cps/mL — ABNORMAL HIGH

## 2021-06-15 ENCOUNTER — Ambulatory Visit: Payer: Self-pay | Admitting: General Surgery

## 2021-06-15 NOTE — H&P (Signed)
°  REFERRING PHYSICIAN:  Comer, Robert W, MD ° °PROVIDER:  Analiz Tvedt CHRISTINE Shamiracle Gorden, MD ° °MRN: [id number] °DOB: 11/22/1990 °DATE OF ENCOUNTER: 06/15/2021 ° °Subjective  ° °Chief Complaint: Rectal Pain °  ° ° °History of Present Illness: °Cory Russell is a 31 y.o. male who is seen today as an office consultation at the request of Dr. Comer for evaluation of Rectal Pain ° 31-year-old male who presents to the office with about 2-year history of rectal pain and a rectal mass.  Over the past several months it has been enlarging in size and he has noticed several other skin tags in the area.  It occasionally causes him significant pain requiring narcotics as well as some intermittent bleeding.  He reports regular bowel habits.  Patient is HIV positive on treatment.   ° °  ° °Review of Systems: °A complete review of systems was obtained from the patient.  I have reviewed this information and discussed as appropriate with the patient.  See HPI as well for other ROS. ° ° ° °Medical History: °Past Medical History:  °Diagnosis Date  ° HIV -NO current or prior AIDS criteria or opportunistic infection, Asymptomatic (CMS-HCC)   ° ° °There is no problem list on file for this patient. ° ° °History reviewed. No pertinent surgical history.  ° °No Known Allergies ° °Current Outpatient Medications on File Prior to Visit  °Medication Sig Dispense Refill  ° darunavir ethanolate (PREZISTA) 800 mg tablet     ° elvitegravir-cobicistat-emtricitabine-tenofovir alafenamide (GENVOYA) 150-150-200-10 mg tablet Take 1 tablet by mouth once daily    ° °No current facility-administered medications on file prior to visit.  ° ° °Family History  °Problem Relation Age of Onset  ° Diabetes Mother   °  ° °Social History  ° °Tobacco Use  °Smoking Status Never  °Smokeless Tobacco Never  °  ° °Social History  ° °Socioeconomic History  ° Marital status: Single  °Tobacco Use  ° Smoking status: Never  ° Smokeless tobacco: Never  °Vaping Use  ° Vaping Use: Never  used  °Substance and Sexual Activity  ° Alcohol use: Defer  ° Drug use: Defer  ° ° °Objective:  ° ° °Vitals:  ° 06/15/21 1109  °BP: 112/62  °Pulse: 76  °SpO2: 100%  °Weight: 56 kg (123 lb 6.4 oz)  °Height: 170.2 cm (5' 7")  °  ° °Exam °Gen: NAD °CV: RRR °Lungs: CTA °Abd: soft °Rectal: Left lateral skin tag, pedunculated mass arising from the anal canal with prolapse, inflammatory polyp versus condyloma.  No external condyloma noted.  Unable to do digital rectal exam due to pain. ° ° °Labs, Imaging and Diagnostic Testing: °Last CD4 count was 302. 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  This is enlarging and becoming more painful.  I have recommended excision in the operating room.  We will then have this evaluated by pathology.  If he does have any internal condylomatous lesions we will plan on an ablation at the same time.  We have discussed this in detail.  We have discussed risk of postoperative pain and bleeding.  We have discussed risk of recurrence depending on pathology.  All questions were answered.  Patient would like to proceed with surgery. °

## 2021-06-15 NOTE — H&P (View-Only) (Signed)
°  REFERRING PHYSICIAN:  Comer, Okey Regal, MD  PROVIDER:  Monico Blitz, MD  MRN: [id number] DOB: [birth year] DATE OF ENCOUNTER: 06/15/2021  Subjective   Chief Complaint: Rectal Pain     History of Present Illness: Cory Russell is a 31 y.o. male who is seen today as an office consultation at the request of Dr. Linus Salmons for evaluation of Rectal Pain  31 year old male who presents to the office with about 2-year history of rectal pain and a rectal mass.  Over the past several months it has been enlarging in size and he has noticed several other skin tags in the area.  It occasionally causes him significant pain requiring narcotics as well as some intermittent bleeding.  He reports regular bowel habits.  Patient is HIV positive on treatment.       Review of Systems: A complete review of systems was obtained from the patient.  I have reviewed this information and discussed as appropriate with the patient.  See HPI as well for other ROS.    Medical History: Past Medical History:  Diagnosis Date   HIV -NO current or prior AIDS criteria or opportunistic infection, Asymptomatic (CMS-HCC)     There is no problem list on file for this patient.   History reviewed. No pertinent surgical history.   No Known Allergies  Current Outpatient Medications on File Prior to Visit  Medication Sig Dispense Refill   darunavir ethanolate (PREZISTA) 800 mg tablet      elvitegravir-cobicistat-emtricitabine-tenofovir alafenamide (GENVOYA) 150-150-200-10 mg tablet Take 1 tablet by mouth once daily     No current facility-administered medications on file prior to visit.    Family History  Problem Relation Age of Onset   Diabetes Mother      Social History   Tobacco Use  Smoking Status Never  Smokeless Tobacco Never     Social History   Socioeconomic History   Marital status: Single  Tobacco Use   Smoking status: Never   Smokeless tobacco: Never  Vaping Use   Vaping Use: Never  used  Substance and Sexual Activity   Alcohol use: Defer   Drug use: Defer    Objective:    Vitals:   06/15/21 1109  BP: 112/62  Pulse: 76  SpO2: 100%  Weight: 56 kg (123 lb 6.4 oz)  Height: 170.2 cm (5\' 7" )     Exam Gen: NAD CV: RRR Lungs: CTA Abd: soft Rectal: Left lateral skin tag, pedunculated mass arising from the anal canal with prolapse, inflammatory polyp versus condyloma.  No external condyloma noted.  Unable to do digital rectal exam due to pain.   Labs, Imaging and Diagnostic Testing: Last CD4 count was 302.  Assessment and Plan:  Diagnoses and all orders for this visit:  Mass of anus     31 year old male with HIV on treatment who presents to the office with a prolapsing anal mass over the past 2 years.  This is enlarging and becoming more painful.  I have recommended excision in the operating room.  We will then have this evaluated by pathology.  If he does have any internal condylomatous lesions we will plan on an ablation at the same time.  We have discussed this in detail.  We have discussed risk of postoperative pain and bleeding.  We have discussed risk of recurrence depending on pathology.  All questions were answered.  Patient would like to proceed with surgery.

## 2021-06-28 ENCOUNTER — Other Ambulatory Visit: Payer: Self-pay

## 2021-06-28 ENCOUNTER — Encounter (HOSPITAL_BASED_OUTPATIENT_CLINIC_OR_DEPARTMENT_OTHER): Payer: Self-pay | Admitting: General Surgery

## 2021-06-28 NOTE — Progress Notes (Signed)
Spoke w/ via phone for pre-op interview--- pt Lab needs dos----  [employer] , ekg and urine drug screen if mda request it (last cocaine use one month ago per pt)         Lab results------ no COVID test -----patient states asymptomatic no test needed Arrive at ------- 1300 on 06-30-2021 NPO after MN NO Solid Food.  Clear liquids from MN until--- 1200 Med rec completed Medications to take morning of surgery ----- none Diabetic medication ----- n/a Patient instructed no nail polish to be worn day of surgery Patient instructed to bring photo id and insurance card day of surgery Patient aware to have Driver (ride ) / caregiver for 24 hours after surgery --mother, jenny Patient Special Instructions ----- n/a Pre-Op special Istructions ----- n/a Patient verbalized understanding of instructions that were given at this phone interview. Patient denies shortness of breath, chest pain, fever, cough at this phone interview.

## 2021-06-29 NOTE — Progress Notes (Signed)
Patient notified of surgery time change and arrival time change. Patient is now to arrive at 0900 for 1100 case. Clear liquids until 0800. Patient notified of instructions and verbalized understanding.

## 2021-06-30 ENCOUNTER — Other Ambulatory Visit: Payer: Self-pay

## 2021-06-30 ENCOUNTER — Encounter (HOSPITAL_BASED_OUTPATIENT_CLINIC_OR_DEPARTMENT_OTHER)
Admission: RE | Disposition: A | Discharge status: Home / Self Care | Payer: Self-pay | Source: Ambulatory Visit | Attending: General Surgery

## 2021-06-30 ENCOUNTER — Encounter (HOSPITAL_BASED_OUTPATIENT_CLINIC_OR_DEPARTMENT_OTHER): Payer: Self-pay | Admitting: General Surgery

## 2021-06-30 ENCOUNTER — Other Ambulatory Visit (HOSPITAL_COMMUNITY): Payer: Self-pay

## 2021-06-30 ENCOUNTER — Ambulatory Visit (HOSPITAL_BASED_OUTPATIENT_CLINIC_OR_DEPARTMENT_OTHER): Payer: 59 | Admitting: Certified Registered Nurse Anesthetist

## 2021-06-30 ENCOUNTER — Ambulatory Visit (HOSPITAL_BASED_OUTPATIENT_CLINIC_OR_DEPARTMENT_OTHER)
Admission: RE | Admit: 2021-06-30 | Discharge: 2021-06-30 | Disposition: A | Discharge status: Home / Self Care | Payer: 59 | Source: Ambulatory Visit | Attending: General Surgery | Admitting: General Surgery

## 2021-06-30 DIAGNOSIS — K219 Gastro-esophageal reflux disease without esophagitis: Secondary | ICD-10-CM | POA: Diagnosis not present

## 2021-06-30 DIAGNOSIS — Z01818 Encounter for other preprocedural examination: Secondary | ICD-10-CM

## 2021-06-30 DIAGNOSIS — I10 Essential (primary) hypertension: Secondary | ICD-10-CM | POA: Diagnosis not present

## 2021-06-30 DIAGNOSIS — K6289 Other specified diseases of anus and rectum: Secondary | ICD-10-CM | POA: Diagnosis not present

## 2021-06-30 DIAGNOSIS — Z21 Asymptomatic human immunodeficiency virus [HIV] infection status: Secondary | ICD-10-CM | POA: Diagnosis not present

## 2021-06-30 DIAGNOSIS — R229 Localized swelling, mass and lump, unspecified: Secondary | ICD-10-CM | POA: Diagnosis present

## 2021-06-30 DIAGNOSIS — A630 Anogenital (venereal) warts: Secondary | ICD-10-CM | POA: Insufficient documentation

## 2021-06-30 HISTORY — DX: Cocaine abuse, uncomplicated: F14.10

## 2021-06-30 HISTORY — DX: Other specified diseases of anus and rectum: K62.89

## 2021-06-30 HISTORY — PX: MASS EXCISION: SHX2000

## 2021-06-30 LAB — POCT I-STAT, CHEM 8
BUN: 16 mg/dL (ref 6–20)
Calcium, Ion: 1.2 mmol/L (ref 1.15–1.40)
Chloride: 103 mmol/L (ref 98–111)
Creatinine, Ser: 0.9 mg/dL (ref 0.61–1.24)
Glucose, Bld: 105 mg/dL — ABNORMAL HIGH (ref 70–99)
HCT: 40 % (ref 39.0–52.0)
Hemoglobin: 13.6 g/dL (ref 13.0–17.0)
Potassium: 4.3 mmol/L (ref 3.5–5.1)
Sodium: 140 mmol/L (ref 135–145)
TCO2: 28 mmol/L (ref 22–32)

## 2021-06-30 SURGERY — EXCISION MASS
Anesthesia: Monitor Anesthesia Care | Site: Rectum

## 2021-06-30 MED ORDER — MEPERIDINE HCL 25 MG/ML IJ SOLN: 6.2500 mg | INTRAMUSCULAR | Status: DC | PRN

## 2021-06-30 MED ORDER — PROPOFOL 10 MG/ML IV BOLUS
INTRAVENOUS | Status: AC
  Filled 2021-06-30: qty 20

## 2021-06-30 MED ORDER — MIDAZOLAM HCL 5 MG/5ML IJ SOLN
INTRAMUSCULAR | Status: DC | PRN
  Administered 2021-06-30: 2 mg via INTRAVENOUS

## 2021-06-30 MED ORDER — ACETAMINOPHEN 500 MG PO TABS
1000.0000 mg | ORAL_TABLET | ORAL | Status: AC
Start: 2021-06-30 — End: 2021-06-30
  Administered 2021-06-30: 09:00:00 1000 mg via ORAL

## 2021-06-30 MED ORDER — KETAMINE HCL 10 MG/ML IJ SOLN
INTRAMUSCULAR | Status: DC | PRN
  Administered 2021-06-30: 20 mg via INTRAVENOUS
  Administered 2021-06-30: 10 mg via INTRAVENOUS

## 2021-06-30 MED ORDER — FENTANYL CITRATE (PF) 100 MCG/2ML IJ SOLN
INTRAMUSCULAR | Status: AC
  Filled 2021-06-30: qty 2

## 2021-06-30 MED ORDER — MIDAZOLAM HCL 2 MG/2ML IJ SOLN
INTRAMUSCULAR | Status: AC
  Filled 2021-06-30: qty 2

## 2021-06-30 MED ORDER — GABAPENTIN 300 MG PO CAPS
ORAL_CAPSULE | ORAL | Status: AC
  Filled 2021-06-30: qty 1

## 2021-06-30 MED ORDER — PROPOFOL 500 MG/50ML IV EMUL
INTRAVENOUS | Status: DC | PRN
  Administered 2021-06-30: 200 ug/kg/min via INTRAVENOUS

## 2021-06-30 MED ORDER — ONDANSETRON HCL 4 MG/2ML IJ SOLN
INTRAMUSCULAR | Status: DC | PRN
  Administered 2021-06-30: 4 mg via INTRAVENOUS

## 2021-06-30 MED ORDER — BUPIVACAINE LIPOSOME 1.3 % IJ SUSP
INTRAMUSCULAR | Status: DC | PRN
  Administered 2021-06-30: 20 mL

## 2021-06-30 MED ORDER — FENTANYL CITRATE (PF) 100 MCG/2ML IJ SOLN: 25.0000 ug | INTRAMUSCULAR | Status: DC | PRN

## 2021-06-30 MED ORDER — MIDAZOLAM HCL 2 MG/2ML IJ SOLN: 0.5000 mg | Freq: Once | INTRAMUSCULAR | Status: DC | PRN

## 2021-06-30 MED ORDER — OXYCODONE HCL 5 MG PO TABS
5.0000 mg | ORAL_TABLET | Freq: Four times a day (QID) | ORAL | 0 refills | Status: DC | PRN
  Filled 2021-06-30: qty 30, 4d supply, fill #0

## 2021-06-30 MED ORDER — FENTANYL CITRATE (PF) 100 MCG/2ML IJ SOLN
INTRAMUSCULAR | Status: DC | PRN
  Administered 2021-06-30: 50 ug via INTRAVENOUS

## 2021-06-30 MED ORDER — LACTATED RINGERS IV SOLN: INTRAVENOUS | Status: DC

## 2021-06-30 MED ORDER — PROPOFOL 10 MG/ML IV BOLUS
INTRAVENOUS | Status: DC | PRN
  Administered 2021-06-30 (×2): 20 mg via INTRAVENOUS

## 2021-06-30 MED ORDER — BUPIVACAINE-EPINEPHRINE 0.5% -1:200000 IJ SOLN
INTRAMUSCULAR | Status: DC | PRN
  Administered 2021-06-30: 30 mL

## 2021-06-30 MED ORDER — ONDANSETRON HCL 4 MG/2ML IJ SOLN
INTRAMUSCULAR | Status: AC
  Filled 2021-06-30: qty 2

## 2021-06-30 MED ORDER — 0.9 % SODIUM CHLORIDE (POUR BTL) OPTIME
TOPICAL | Status: DC | PRN
  Administered 2021-06-30: 12:00:00 500 mL

## 2021-06-30 MED ORDER — OXYCODONE HCL 5 MG PO TABS: 5.0000 mg | ORAL_TABLET | Freq: Once | ORAL | Status: DC | PRN

## 2021-06-30 MED ORDER — ACETAMINOPHEN 500 MG PO TABS
ORAL_TABLET | ORAL | Status: AC
  Filled 2021-06-30: qty 2

## 2021-06-30 MED ORDER — ACETAMINOPHEN 500 MG PO TABS: 1000.0000 mg | ORAL_TABLET | Freq: Once | ORAL | Status: AC

## 2021-06-30 MED ORDER — OXYCODONE HCL 5 MG/5ML PO SOLN: 5.0000 mg | Freq: Once | ORAL | Status: DC | PRN

## 2021-06-30 MED ORDER — GABAPENTIN 300 MG PO CAPS
300.0000 mg | ORAL_CAPSULE | ORAL | Status: AC
  Administered 2021-06-30: 09:00:00 300 mg via ORAL

## 2021-06-30 MED ORDER — HEMOSTATIC AGENTS (NO CHARGE) OPTIME
TOPICAL | Status: DC | PRN
  Administered 2021-06-30: 1

## 2021-06-30 MED ORDER — DIAZEPAM 5 MG PO TABS
5.0000 mg | ORAL_TABLET | Freq: Three times a day (TID) | ORAL | 0 refills | Status: DC | PRN
  Filled 2021-06-30: qty 10, 4d supply, fill #0

## 2021-06-30 MED ORDER — KETAMINE HCL 50 MG/5ML IJ SOSY
PREFILLED_SYRINGE | INTRAMUSCULAR | Status: AC
  Filled 2021-06-30: qty 5

## 2021-06-30 MED ORDER — PROMETHAZINE HCL 25 MG/ML IJ SOLN: 6.2500 mg | INTRAMUSCULAR | Status: DC | PRN

## 2021-06-30 MED ORDER — GLYCOPYRROLATE PF 0.2 MG/ML IJ SOSY
PREFILLED_SYRINGE | INTRAMUSCULAR | Status: DC | PRN
Start: 2021-06-30 — End: 2021-06-30
  Administered 2021-06-30: .2 mg via INTRAVENOUS

## 2021-06-30 SURGICAL SUPPLY — 75 items
ADH SKN CLS APL DERMABOND .7 (GAUZE/BANDAGES/DRESSINGS)
APL PRP STRL LF DISP 70% ISPRP (MISCELLANEOUS) ×1
APL SKNCLS STERI-STRIP NONHPOA (GAUZE/BANDAGES/DRESSINGS) ×1
BENZOIN TINCTURE PRP APPL 2/3 (GAUZE/BANDAGES/DRESSINGS) ×2 IMPLANT
BLADE CLIPPER SENSICLIP SURGIC (BLADE) IMPLANT
BLADE EXTENDED COATED 6.5IN (ELECTRODE) ×1 IMPLANT
BLADE SURG 10 STRL SS (BLADE) ×2 IMPLANT
CHLORAPREP W/TINT 26 (MISCELLANEOUS) ×2 IMPLANT
CLEANER CAUTERY TIP 5X5 PAD (MISCELLANEOUS) IMPLANT
COVER BACK TABLE 60X90IN (DRAPES) ×2 IMPLANT
COVER MAYO STAND STRL (DRAPES) ×2 IMPLANT
DECANTER SPIKE VIAL GLASS SM (MISCELLANEOUS) IMPLANT
DERMABOND ADVANCED (GAUZE/BANDAGES/DRESSINGS)
DERMABOND ADVANCED .7 DNX12 (GAUZE/BANDAGES/DRESSINGS) IMPLANT
DRAPE HYSTEROSCOPY (MISCELLANEOUS) IMPLANT
DRAPE LAPAROTOMY 100X72 PEDS (DRAPES) ×2 IMPLANT
DRAPE SHEET LG 3/4 BI-LAMINATE (DRAPES) IMPLANT
DRAPE UTILITY XL STRL (DRAPES) ×2 IMPLANT
DRSG PAD ABDOMINAL 8X10 ST (GAUZE/BANDAGES/DRESSINGS) ×2 IMPLANT
DRSG TEGADERM 4X4.75 (GAUZE/BANDAGES/DRESSINGS) IMPLANT
ELECT REM PT RETURN 9FT ADLT (ELECTROSURGICAL) ×2
ELECTRODE REM PT RTRN 9FT ADLT (ELECTROSURGICAL) ×1 IMPLANT
GAUZE 4X4 16PLY ~~LOC~~+RFID DBL (SPONGE) ×2 IMPLANT
GAUZE SPONGE 4X4 12PLY STRL (GAUZE/BANDAGES/DRESSINGS) ×2 IMPLANT
GLOVE SURG ENC MOIS LTX SZ6.5 (GLOVE) ×4 IMPLANT
GLOVE SURG UNDER LTX SZ6.5 (GLOVE) ×2 IMPLANT
GLOVE SURG UNDER POLY LF SZ6.5 (GLOVE) ×2 IMPLANT
GLOVE SURG UNDER POLY LF SZ7.5 (GLOVE) ×1 IMPLANT
GOWN STRL REUS W/ TWL LRG LVL3 (GOWN DISPOSABLE) IMPLANT
GOWN STRL REUS W/TWL LRG LVL3 (GOWN DISPOSABLE) ×2
GOWN STRL REUS W/TWL XL LVL3 (GOWN DISPOSABLE) ×2 IMPLANT
HYDROGEN PEROXIDE 16OZ (MISCELLANEOUS) IMPLANT
IV CATH 14GX2 1/4 (CATHETERS) IMPLANT
IV CATH 18G SAFETY (IV SOLUTION) IMPLANT
KIT SIGMOIDOSCOPE (SET/KITS/TRAYS/PACK) IMPLANT
KIT TURNOVER CYSTO (KITS) ×2 IMPLANT
LEGGING LITHOTOMY PAIR STRL (DRAPES) IMPLANT
LOOP VESSEL MAXI BLUE (MISCELLANEOUS) IMPLANT
NEEDLE HYPO 22GX1.5 SAFETY (NEEDLE) ×2 IMPLANT
NS IRRIG 500ML POUR BTL (IV SOLUTION) ×3 IMPLANT
PACK BASIN DAY SURGERY FS (CUSTOM PROCEDURE TRAY) ×2 IMPLANT
PAD ARMBOARD 7.5X6 YLW CONV (MISCELLANEOUS) ×2 IMPLANT
PAD CLEANER CAUTERY TIP 5X5 (MISCELLANEOUS) ×1
PAD PREP 24X48 CUFFED NSTRL (MISCELLANEOUS) IMPLANT
PANTS MESH DISP LRG (UNDERPADS AND DIAPERS) ×1 IMPLANT
PANTS MESH DISPOSABLE L (UNDERPADS AND DIAPERS) ×1
PENCIL SMOKE EVACUATOR (MISCELLANEOUS) ×2 IMPLANT
SPONGE HEMORRHOID 8X3CM (HEMOSTASIS) IMPLANT
SPONGE SURGIFOAM ABS GEL 100 (HEMOSTASIS) IMPLANT
SPONGE SURGIFOAM ABS GEL 12-7 (HEMOSTASIS) IMPLANT
STRIP CLOSURE SKIN 1/2X4 (GAUZE/BANDAGES/DRESSINGS) IMPLANT
SUT CHROMIC 2 0 SH (SUTURE) ×3 IMPLANT
SUT CHROMIC 3 0 SH 27 (SUTURE) IMPLANT
SUT ETHIBOND 0 (SUTURE) IMPLANT
SUT ETHILON 2 0 FS 18 (SUTURE) IMPLANT
SUT ETHILON 4 0 PS 2 18 (SUTURE) IMPLANT
SUT SILK 2 0 SH (SUTURE) IMPLANT
SUT VIC AB 2-0 SH 27 (SUTURE)
SUT VIC AB 2-0 SH 27XBRD (SUTURE) IMPLANT
SUT VIC AB 3-0 SH 18 (SUTURE) IMPLANT
SUT VIC AB 3-0 SH 27 (SUTURE)
SUT VIC AB 3-0 SH 27XBRD (SUTURE) IMPLANT
SUT VIC AB 4-0 PS2 18 (SUTURE) IMPLANT
SUT VIC AB 4-0 SH 18 (SUTURE) IMPLANT
SWAB CULTURE ESWAB REG 1ML (MISCELLANEOUS) IMPLANT
SYR 10ML LL (SYRINGE) IMPLANT
SYR BULB IRRIG 60ML STRL (SYRINGE) ×2 IMPLANT
SYR CONTROL 10ML LL (SYRINGE) ×2 IMPLANT
TOWEL OR 17X26 10 PK STRL BLUE (TOWEL DISPOSABLE) ×2 IMPLANT
TRAY DSU PREP LF (CUSTOM PROCEDURE TRAY) ×2 IMPLANT
TUBE CONNECTING 12X1/4 (SUCTIONS) ×2 IMPLANT
UNDERPAD 30X36 HEAVY ABSORB (UNDERPADS AND DIAPERS) ×2 IMPLANT
VACUUM HOSE 7/8X10 W/ WAND (MISCELLANEOUS) ×1 IMPLANT
WATER STERILE IRR 500ML POUR (IV SOLUTION) ×1 IMPLANT
YANKAUER SUCT BULB TIP NO VENT (SUCTIONS) ×2 IMPLANT

## 2021-06-30 NOTE — Anesthesia Preprocedure Evaluation (Addendum)
Anesthesia Evaluation  Patient identified by MRN, date of birth, ID band Patient awake    Reviewed: Allergy & Precautions, NPO status , Patient's Chart, lab work & pertinent test results  History of Anesthesia Complications Negative for: history of anesthetic complications  Airway Mallampati: I  TM Distance: >3 FB Neck ROM: Full    Dental  (+) Dental Advisory Given, Teeth Intact   Pulmonary Recent URI , Resolved, Current Smoker (vape)Patient did not abstain from smoking.,    breath sounds clear to auscultation       Cardiovascular hypertension, (-) angina Rhythm:Regular Rate:Normal     Neuro/Psych negative neurological ROS     GI/Hepatic GERD  Controlled,(+)     substance abuse (not recently)  cocaine use,   Endo/Other  negative endocrine ROS  Renal/GU negative Renal ROS     Musculoskeletal   Abdominal   Peds  Hematology negative hematology ROS (+) HIV,   Anesthesia Other Findings   Reproductive/Obstetrics                            Anesthesia Physical Anesthesia Plan  ASA: 3  Anesthesia Plan: MAC   Post-op Pain Management: Tylenol PO (pre-op)   Induction:   PONV Risk Score and Plan: 1 and Ondansetron and Treatment may vary due to age or medical condition  Airway Management Planned: Simple Face Mask and Natural Airway  Additional Equipment: None  Intra-op Plan:   Post-operative Plan:   Informed Consent: I have reviewed the patients History and Physical, chart, labs and discussed the procedure including the risks, benefits and alternatives for the proposed anesthesia with the patient or authorized representative who has indicated his/her understanding and acceptance.     Dental advisory given  Plan Discussed with: CRNA and Surgeon  Anesthesia Plan Comments:        Anesthesia Quick Evaluation

## 2021-06-30 NOTE — Anesthesia Postprocedure Evaluation (Signed)
Anesthesia Post Note  Patient: Cory Russell  Procedure(s) Performed: EXCISION OF ANAL CONDYLOMA , ABLATION OF ANAL CONDYLOMA (Rectum) EXAM UNDER ANESTHESIA (Rectum)     Patient location during evaluation: PACU Anesthesia Type: MAC Level of consciousness: patient cooperative, awake and alert and oriented Pain management: pain level controlled Vital Signs Assessment: post-procedure vital signs reviewed and stable Respiratory status: spontaneous breathing, nonlabored ventilation and respiratory function stable Cardiovascular status: stable and blood pressure returned to baseline Postop Assessment: no apparent nausea or vomiting and able to ambulate Anesthetic complications: no   No notable events documented.  Last Vitals:  Vitals:   06/30/21 1230 06/30/21 1245  BP: (!) 144/98 (!) 147/102  Pulse: 96 88  Resp: (!) 21 20  Temp:    SpO2: 99% 100%    Last Pain:  Vitals:   06/30/21 1245  TempSrc:   PainSc: 0-No pain                 Nicollette Wilhelmi,E. Toshiko Kemler

## 2021-06-30 NOTE — Discharge Instructions (Addendum)
ANORECTAL SURGERY: POST OP INSTRUCTIONS Take your usually prescribed home medications unless otherwise directed. DIET: During the first few hours after surgery sip on some liquids until you are able to urinate.  It is normal to not urinate for several hours after this surgery.  If you feel uncomfortable, please contact the office for instructions.  After you are able to urinate,you may eat, if you feel like it.  Follow a light bland diet the first 24 hours after arrival home, such as soup, liquids, crackers, etc.  Be sure to include lots of fluids daily (6-8 glasses).  Avoid fast food or heavy meals, as your are more likely to get nauseated.  Eat a low fat diet the next few days after surgery.  Limit caffeine intake to 1-2 servings a day. PAIN CONTROL: Pain is best controlled by a usual combination of several different methods TOGETHER: Muscle relaxation  Soak in a warm bath (or Sitz bath) three times a day and after bowel movements.  Continue to do this until all pain is resolved. Take the muscle relaxer (Valium) every 6 hours for the first 2 days after surgery  Over the counter pain medication Prescription pain medication Most patients will experience some swelling and discomfort in the anus/rectal area and incisions.  Heat such as warm towels, sitz baths, warm baths, etc to help relax tight/sore spots and speed recovery.  Some people prefer to use ice, especially in the first couple days after surgery, as it may decrease the pain and swelling, or alternate between ice & heat.  Experiment to what works for you.  Swelling and bruising can take several weeks to resolve.  Pain can take even longer to completely resolve. It is helpful to take an over-the-counter pain medication regularly for the first few weeks.  Choose one of the following that works best for you: Naproxen (Aleve, etc)  Two 220mg  tabs twice a day Ibuprofen (Advil, etc) Three 200mg  tabs four times a day (every meal & bedtime) A   prescription for pain medication (such as percocet, oxycodone, hydrocodone, etc) should be given to you upon discharge.  Take your pain medication as prescribed.  If you are having problems/concerns with the prescription medicine (does not control pain, nausea, vomiting, rash, itching, etc), please call us [phone] to see if we need to switch you to a different pain medicine that will work better for you and/or control your side effect better. If you need a refill on your pain medication, please contact your pharmacy.  They will contact our office to request authorization. Prescriptions will not be filled after 5 pm or on week-ends. KEEP YOUR BOWELS REGULAR and AVOID CONSTIPATION The goal is one to two soft bowel movements a day.  You should at least have a bowel movement every other day. Avoid getting constipated.  Between the surgery and the pain medications, it is common to experience some constipation. This can be very painful after rectal surgery.  Increasing fluid intake and taking a fiber supplement (such as Metamucil, Citrucel, FiberCon, etc) 1-2 times a day regularly will usually help prevent this problem from occurring.  A stool softener like colace is also recommended.  This can be purchased over the counter at your pharmacy.  You can take it up to 3 times a day.  If you do not have a bowel movement after 24 hrs since your surgery, take one does of milk of magnesia.  If you still haven't had a bowel movement 8-12 hours after  that dose, take another dose.  If you don't have a bowel movement 48 hrs after surgery, purchase a Fleets enema from the drug store and administer gently per package instructions.  If you still are having trouble with your bowel movements after that, please call the office for further instructions. If you develop diarrhea or have many loose bowel movements, simplify your diet to bland foods & liquids for a few days.  Stop any stool softeners and decrease your fiber  supplement.  Switching to mild anti-diarrheal medications (Kayopectate, Pepto Bismol) can help.  If this worsens or does not improve, please call us.  Wound Care Remove your bandages before your first bowel movement or 8 hours after surgery.     Remove any wound packing material at this tim,e as well.  You do not need to repack the wound unless instructed otherwise.  Wear an absorbent pad or soft cotton gauze in your underwear to catch any drainage and help keep the area clean. You should change this every 2-3 hours while awake. Keep the area clean and dry.  Bathe / shower every day, especially after bowel movements.  Keep the area clean by showering / bathing over the incision / wound.   It is okay to soak an open wound to help wash it.  Wet wipes or showers / gentle washing after bowel movements is often less traumatic than regular toilet paper. You may have some styrofoam-like soft packing in the rectum which will come out with the first bowel movement.  You will often notice bleeding with bowel movements.  This should slow down by the end of the first week of surgery Expect some drainage.  This should slow down, too, by the end of the first week of surgery.  Wear an absorbent pad or soft cotton gauze in your underwear until the drainage stops. Do Not sit on a rubber or pillow ring.  This can make you symptoms worse.  You may sit on a soft pillow if needed.  ACTIVITIES as tolerated:   You may resume regular (light) daily activities beginning the next day--such as daily self-care, walking, climbing stairs--gradually increasing activities as tolerated.  If you can walk 30 minutes without difficulty, it is safe to try more intense activity such as jogging, treadmill, bicycling, low-impact aerobics, swimming, etc. Save the most intensive and strenuous activity for last such as sit-ups, heavy lifting, contact sports, etc  Refrain from any heavy lifting or straining until you are off narcotics for pain  control.   You may drive when you are no longer taking prescription pain medication, you can comfortably sit for long periods of time, and you can safely maneuver your car and apply brakes. You may have sexual intercourse when it is comfortable.  FOLLOW UP in our office Please call [facility] at [phone] to set up an appointment to see your surgeon in the office for a follow-up appointment approximately 3-4 weeks after your surgery. Make sure that you call for this appointment the day you arrive home to insure a convenient appointment time. 10. IF YOU HAVE DISABILITY OR FAMILY LEAVE FORMS, BRING THEM TO THE OFFICE FOR PROCESSING.  DO NOT GIVE THEM TO YOUR DOCTOR.     WHEN TO CALL us [phone]: Poor pain control Reactions / problems with new medications (rash/itching, nausea, etc)  Fever over 101.5 F (38.5 C) Inability to urinate Nausea and/or vomiting Worsening swelling or bruising Continued bleeding from incision. Increased pain, redness, or drainage from the  incision  The clinic staff is available to answer your questions during regular business hours (8:30am-5pm).  Please dont hesitate to call and ask to speak to one of our nurses for clinical concerns.   A surgeon from [facility] is always on call at the hospitals   If you have a medical emergency, go to the nearest emergency room or call 911.    [facility], [place], [place], [place], [place]  [address] ? MAIN: [phone] ? TOLL FREE: [phone] ? FAX [phone] [email or web address]    Post Anesthesia Home Care Instructions  Activity: Get plenty of rest for the remainder of the day. A responsible adult should stay with you for 24 hours following the procedure.  For the next 24 hours, DO NOT: -Drive a car -Paediatric nurse -Drink alcoholic beverages -Take any medication unless instructed by your physician -Make any legal decisions or sign important  papers.  Meals: Start with liquid foods such as gelatin or soup. Progress to regular foods as tolerated. Avoid greasy, spicy, heavy foods. If nausea and/or vomiting occur, drink only clear liquids until the nausea and/or vomiting subsides. Call your physician if vomiting continues.  Special Instructions/Symptoms: Your throat may feel dry or sore from the anesthesia or the breathing tube placed in your throat during surgery. If this causes discomfort, gargle with warm salt water. The discomfort should disappear within 24 hours.  If you had a scopolamine patch placed behind your ear for the management of post- operative nausea and/or vomiting:  1. The medication in the patch is effective for 72 hours, after which it should be removed.  Wrap patch in a tissue and discard in the trash. Wash hands thoroughly with soap and water. 2. You may remove the patch earlier than 72 hours if you experience unpleasant side effects which may include dry mouth, dizziness or visual disturbances. 3. Avoid touching the patch. Wash your hands with soap and water after contact with the patch.   Information for Discharge Teaching: EXPAREL (bupivacaine liposome injectable suspension)   Your surgeon gave you EXPAREL(bupivacaine) in your surgical incision to help control your pain after surgery.  EXPAREL is a local anesthetic that provides pain relief by numbing the tissue around the surgical site. EXPAREL is designed to release pain medication over time and can control pain for up to 72 hours. Depending on how you respond to EXPAREL, you may require less pain medication during your recovery.  Possible side effects: Temporary loss of sensation or ability to move in the area where bupivacaine was injected. Nausea, vomiting, constipation Rarely, numbness and tingling in your mouth or lips, lightheadedness, or anxiety may occur. Call your doctor right away if you think you may be experiencing any of these sensations, or  if you have other questions regarding possible side effects.  Follow all other discharge instructions given to you by your surgeon or nurse. Eat a healthy diet and drink plenty of water or other fluids.  If you return to the hospital for any reason within 96 hours following the administration of EXPAREL, please inform your health care providers.

## 2021-06-30 NOTE — Op Note (Signed)
06/30/2021  12:09 PM  PATIENT:  Cory Russell  31 y.o. male  Patient Care Team: Lacinda Axon, MD as PCP - General (Internal Medicine) Comer, Okey Regal, MD as Consulting Physician (Infectious Diseases)  PRE-OPERATIVE DIAGNOSIS:  ANAL MASS  POST-OPERATIVE DIAGNOSIS:  ANAL CONDYLOMA  PROCEDURE:  Procedure(s): EXCISION OF ANAL CONDYLOMA , ABLATION OF ANAL CONDYLOMA EXAM UNDER ANESTHESIA   Surgeon(s): Leighton Ruff, MD  ASSISTANT: none   ANESTHESIA:   local and MAC  SPECIMEN:  Source of Specimen:  anal condyloma  DISPOSITION OF SPECIMEN:  PATHOLOGY  COUNTS:  YES  PLAN OF CARE: Discharge to home after PACU  PATIENT DISPOSITION:  PACU - hemodynamically stable.  INDICATION: 31 y.o. M with anal pain and prolapsing mass   OR FINDINGS: multiple large anal condyloma, all excised or ablated  DESCRIPTION: the patient was identified in the preoperative holding area and taken to the OR where they were laid on the operating room table.  MAC anesthesia was induced without difficulty. The patient was then positioned in prone jackknife position with buttocks gently taped apart.  The patient was then prepped and draped in usual sterile fashion.  SCDs were noted to be in place prior to the initiation of anesthesia. A surgical timeout was performed indicating the correct patient, procedure, positioning and need for preoperative antibiotics.  A rectal block was performed using Marcaine with epinephrine mixed with Experel.    I began with a digital rectal exam.  There was a left lateral prolapsing mass.  I then placed a Hill-Ferguson anoscope into the anal canal and evaluated this completely.  This was clearly condyloma.  I excised this sharply and sent it to pathology for evaluation.  The other surrounding lesions were ablated with cautery.  I then closed the defect with interrupted 2-0 Chromic sutures.  I then ablated all remaining lesions, which were mostly located at the dentate line.   Once no more lesions could be seen or palpated and hemostasis was good, I placed a gelfoam into the anal canal and covered with a dry dressing.  All counts were correct.  Patient was awakened and sent to the PACU in stable condition.

## 2021-06-30 NOTE — Transfer of Care (Signed)
Immediate Anesthesia Transfer of Care Note  Patient: Cory Russell  Procedure(s) Performed: EXCISION OF ANAL CONDYLOMA , ABLATION OF ANAL CONDYLOMA (Rectum) EXAM UNDER ANESTHESIA (Rectum)  Patient Location: PACU  Anesthesia Type:MAC  Level of Consciousness: awake, alert , oriented and patient cooperative  Airway & Oxygen Therapy: Patient Spontanous Breathing and Patient connected to face mask oxygen  Post-op Assessment: Report given to RN and Post -op Vital signs reviewed and stable  Post vital signs: Reviewed and stable  Last Vitals:  Vitals Value Taken Time  BP 145/90 06/30/21 1217  Temp    Pulse    Resp 25 06/30/21 1220  SpO2    Vitals shown include unvalidated device data.  Last Pain:  Vitals:   06/30/21 0921  TempSrc: Oral  PainSc: 0-No pain      Patients Stated Pain Goal: 6 (77/11/65 7903)  Complications: No notable events documented.

## 2021-06-30 NOTE — Interval H&P Note (Signed)
History and Physical Interval Note:  06/30/2021 11:11 AM  Cory Russell  has presented today for surgery, with the diagnosis of ANAL MASS.  The various methods of treatment have been discussed with the patient and family. After consideration of risks, benefits and other options for treatment, the patient has consented to  Procedure(s): EXCISION OF ANAL MASS (N/A) EXAM UNDER ANESTHESIA (N/A) as a surgical intervention.  The patient's history has been reviewed, patient examined, no change in status, stable for surgery.  I have reviewed the patient's chart and labs.  Questions were answered to the patient's satisfaction.     Vanita Panda, MD  Colorectal and General Surgery [facility]

## 2021-07-01 ENCOUNTER — Encounter (HOSPITAL_BASED_OUTPATIENT_CLINIC_OR_DEPARTMENT_OTHER): Payer: Self-pay | Admitting: General Surgery

## 2021-07-01 LAB — SURGICAL PATHOLOGY

## 2021-07-22 ENCOUNTER — Ambulatory Visit: Payer: 59 | Admitting: Internal Medicine

## 2021-08-31 ENCOUNTER — Ambulatory Visit: Payer: Self-pay | Admitting: Internal Medicine

## 2021-12-06 ENCOUNTER — Ambulatory Visit: Payer: Self-pay | Admitting: General Surgery

## 2021-12-06 NOTE — H&P (View-Only) (Signed)
   PROVIDER:  Jaasia Viglione CHRISTINE Ej Pinson, MD  MRN: [id number] DOB: 11/24/1990 DATE OF ENCOUNTER: 12/06/2021 Interval History:   Cory Russell is a 30 y.o. male who underwent excision of anal condyloma, ablation of anal condyloma on 06/30/2021.  He had trouble with significant pain after surgery for the first 2 weeks.  On follow-up exam he was noted to have no further issues.  We placed him on a 2-month course of Aldara.  He returns today for follow-up.  He continues to have some pain in the right anterior perirectal area with occasional swelling.  Past Medical History:  Diagnosis Date   HIV -NO current or prior AIDS criteria or opportunistic infection, Asymptomatic (CMS-HCC)    History reviewed. No pertinent surgical history. Social History   Socioeconomic History   Marital status: Single  Tobacco Use   Smoking status: Never   Smokeless tobacco: Never  Vaping Use   Vaping Use: Never used  Substance and Sexual Activity   Alcohol use: Defer   Drug use: Defer   Family History  Problem Relation Age of Onset   Diabetes Mother    No Known Allergies   Medications:   Current Outpatient Medications on File Prior to Visit  Medication Sig Dispense Refill   darunavir ethanolate (PREZISTA) 800 mg tablet      elvitegravir-cobicistat-emtricitabine-tenofovir alafenamide (GENVOYA) 150-150-200-10 mg tablet Take 1 tablet by mouth once daily     No current facility-administered medications on file prior to visit.  Review of Systems - Negative except as stated above   Physical Examination:   There were no vitals taken for this visit. General: Well-developed, well-nourished, in no acute distress.  CV: RRR Lungs: CTA Abd: soft Rectal: some discomfort,  TTP posteriorly and LL    Procedure: Anoscopy Surgeon: Cory Russell After the risks and benefits were explained, written consent was obtained for above procedure.  A medical assistant chaperone was present thoroughout the entire procedure.   Anesthesia: none Diagnosis: h/o AIN Findings: Large condylomatous lesion in the left lateral and posterior anal canal at the dentate line  Assessment and Plan:   Diagnoses and all orders for this visit:  Anal condyloma    He is status post excision of anal condyloma, ablation of anal condyloma on 06/30/2021.  On follow-up exam he was noted to have a recurrence in the left lateral anal canal at the dentate line.  This is approximately 7 to 8 mm in diameter.  I have recommended laser ablation with exam under anesthesia.  We have discussed the typical complications associated with this procedure including pain and bleeding.  Patient wishes to proceed with surgery.  Cory Kilcrease C Caelin Rosen, MD  Colorectal and General Surgery [facility]  

## 2021-12-06 NOTE — H&P (Signed)
   PROVIDER:  Elenora Gamma, MD  MRN: [id number] DOB: April 08, 1991 DATE OF ENCOUNTER: 12/06/2021 Interval History:   Cory Russell is a 31 y.o. male who underwent excision of anal condyloma, ablation of anal condyloma on 06/30/2021.  He had trouble with significant pain after surgery for the first 2 weeks.  On follow-up exam he was noted to have no further issues.  We placed him on a 14-month course of [place].  He returns today for follow-up.  He continues to have some pain in the right anterior perirectal area with occasional swelling.  Past Medical History:  Diagnosis Date   HIV -NO current or prior AIDS criteria or opportunistic infection, Asymptomatic (CMS-HCC)    History reviewed. No pertinent surgical history. Social History   Socioeconomic History   Marital status: Single  Tobacco Use   Smoking status: Never   Smokeless tobacco: Never  Vaping Use   Vaping Use: Never used  Substance and Sexual Activity   Alcohol use: Defer   Drug use: Defer   Family History  Problem Relation Age of Onset   Diabetes Mother    No Known Allergies   Medications:   Current Outpatient Medications on File Prior to Visit  Medication Sig Dispense Refill   darunavir ethanolate (PREZISTA) 800 mg tablet      elvitegravir-cobicistat-emtricitabine-tenofovir alafenamide (GENVOYA) 150-150-200-10 mg tablet Take 1 tablet by mouth once daily     No current facility-administered medications on file prior to visit.  Review of Systems - Negative except as stated above   Physical Examination:   There were no vitals taken for this visit. General: Well-developed, well-nourished, in no acute distress.  CV: RRR Lungs: CTA Abd: soft Rectal: some discomfort,  TTP posteriorly and LL    Procedure: Anoscopy Surgeon: Maisie Fus After the risks and benefits were explained, written consent was obtained for above procedure.  A medical assistant chaperone was present thoroughout the entire procedure.   Anesthesia: none Diagnosis: h/o AIN Findings: Large condylomatous lesion in the left lateral and posterior anal canal at the dentate line  Assessment and Plan:   Diagnoses and all orders for this visit:  Anal condyloma    He is status post excision of anal condyloma, ablation of anal condyloma on 06/30/2021.  On follow-up exam he was noted to have a recurrence in the left lateral anal canal at the dentate line.  This is approximately 7 to 8 mm in diameter.  I have recommended laser ablation with exam under anesthesia.  We have discussed the typical complications associated with this procedure including pain and bleeding.  Patient wishes to proceed with surgery.  Vanita Panda, MD  Colorectal and General Surgery [facility]

## 2021-12-10 ENCOUNTER — Encounter (HOSPITAL_BASED_OUTPATIENT_CLINIC_OR_DEPARTMENT_OTHER): Payer: Self-pay | Admitting: General Surgery

## 2021-12-13 ENCOUNTER — Other Ambulatory Visit: Payer: Self-pay

## 2021-12-13 ENCOUNTER — Encounter (HOSPITAL_BASED_OUTPATIENT_CLINIC_OR_DEPARTMENT_OTHER): Payer: Self-pay | Admitting: General Surgery

## 2021-12-13 NOTE — Progress Notes (Signed)
Spoke w/ via phone for pre-op interview---pt Lab needs dos---- I stat, ekg  uirne drug screen if requested by mda (pt states last cocaine use " a while ago" per pt on 12-13-2021             Lab results------none COVID test -----patient states asymptomatic no test needed Arrive at -------1015 am 12-13-2021 NPO after MN NO Solid Food.  Clear liquids from MN until---915 am Med rec completed Medications to take morning of surgery -----none Diabetic medication -----n/a Patient instructed no nail polish to be worn day of surgery Patient instructed to bring photo id and insurance card day of surgery Patient aware to have Driver (ride ) / caregiver   asahel risden mother  for 24 hours after surgery  Patient Special Instructions -----none Pre-Op special Istructions -----none Patient verbalized understanding of instructions that were given at this phone interview. Patient denies shortness of breath, chest pain, fever, cough at this phone interview.

## 2021-12-23 ENCOUNTER — Other Ambulatory Visit: Payer: Self-pay

## 2021-12-23 ENCOUNTER — Ambulatory Visit (HOSPITAL_BASED_OUTPATIENT_CLINIC_OR_DEPARTMENT_OTHER): Payer: 59 | Admitting: Certified Registered Nurse Anesthetist

## 2021-12-23 ENCOUNTER — Encounter (HOSPITAL_BASED_OUTPATIENT_CLINIC_OR_DEPARTMENT_OTHER)
Admission: RE | Disposition: A | Discharge status: Home / Self Care | Payer: Self-pay | Source: Home / Self Care | Attending: General Surgery

## 2021-12-23 ENCOUNTER — Ambulatory Visit (HOSPITAL_BASED_OUTPATIENT_CLINIC_OR_DEPARTMENT_OTHER)
Admission: RE | Admit: 2021-12-23 | Discharge: 2021-12-23 | Disposition: A | Discharge status: Home / Self Care | Payer: 59 | Attending: General Surgery | Admitting: General Surgery

## 2021-12-23 ENCOUNTER — Other Ambulatory Visit (HOSPITAL_COMMUNITY): Payer: Self-pay

## 2021-12-23 ENCOUNTER — Encounter (HOSPITAL_BASED_OUTPATIENT_CLINIC_OR_DEPARTMENT_OTHER): Payer: Self-pay | Admitting: General Surgery

## 2021-12-23 DIAGNOSIS — A630 Anogenital (venereal) warts: Secondary | ICD-10-CM | POA: Insufficient documentation

## 2021-12-23 DIAGNOSIS — I10 Essential (primary) hypertension: Secondary | ICD-10-CM | POA: Diagnosis not present

## 2021-12-23 DIAGNOSIS — Z21 Asymptomatic human immunodeficiency virus [HIV] infection status: Secondary | ICD-10-CM | POA: Insufficient documentation

## 2021-12-23 DIAGNOSIS — Z01818 Encounter for other preprocedural examination: Secondary | ICD-10-CM

## 2021-12-23 DIAGNOSIS — B20 Human immunodeficiency virus [HIV] disease: Secondary | ICD-10-CM

## 2021-12-23 HISTORY — PX: LASER ABLATION CONDOLAMATA: SHX5941

## 2021-12-23 HISTORY — DX: Gastro-esophageal reflux disease without esophagitis: K21.9

## 2021-12-23 LAB — POCT I-STAT, CHEM 8
BUN: 14 mg/dL (ref 6–20)
Calcium, Ion: 1.25 mmol/L (ref 1.15–1.40)
Chloride: 99 mmol/L (ref 98–111)
Creatinine, Ser: 0.9 mg/dL (ref 0.61–1.24)
Glucose, Bld: 81 mg/dL (ref 70–99)
HCT: 40 % (ref 39.0–52.0)
Hemoglobin: 13.6 g/dL (ref 13.0–17.0)
Potassium: 4.1 mmol/L (ref 3.5–5.1)
Sodium: 140 mmol/L (ref 135–145)
TCO2: 29 mmol/L (ref 22–32)

## 2021-12-23 SURGERY — ABLATION, CONDYLOMA, USING LASER
Anesthesia: Monitor Anesthesia Care | Site: Rectum

## 2021-12-23 MED ORDER — ACETAMINOPHEN 325 MG PO TABS: 650.0000 mg | ORAL_TABLET | ORAL | Status: DC | PRN

## 2021-12-23 MED ORDER — MIDAZOLAM HCL 5 MG/5ML IJ SOLN
INTRAMUSCULAR | Status: DC | PRN
  Administered 2021-12-23: 2 mg via INTRAVENOUS

## 2021-12-23 MED ORDER — PROPOFOL 10 MG/ML IV BOLUS
INTRAVENOUS | Status: DC | PRN
  Administered 2021-12-23: 20 mg via INTRAVENOUS

## 2021-12-23 MED ORDER — GABAPENTIN 300 MG PO CAPS
ORAL_CAPSULE | ORAL | Status: AC
  Filled 2021-12-23: qty 1

## 2021-12-23 MED ORDER — ONDANSETRON HCL 4 MG/2ML IJ SOLN
INTRAMUSCULAR | Status: AC
  Filled 2021-12-23: qty 2

## 2021-12-23 MED ORDER — FENTANYL CITRATE (PF) 100 MCG/2ML IJ SOLN
INTRAMUSCULAR | Status: AC
  Filled 2021-12-23: qty 2

## 2021-12-23 MED ORDER — LIDOCAINE HCL (CARDIAC) PF 100 MG/5ML IV SOSY
PREFILLED_SYRINGE | INTRAVENOUS | Status: DC | PRN
  Administered 2021-12-23: 50 mg via INTRAVENOUS

## 2021-12-23 MED ORDER — DEXAMETHASONE SODIUM PHOSPHATE 4 MG/ML IJ SOLN
INTRAMUSCULAR | Status: DC | PRN
  Administered 2021-12-23: 10 mg via INTRAVENOUS

## 2021-12-23 MED ORDER — DEXAMETHASONE SODIUM PHOSPHATE 10 MG/ML IJ SOLN
INTRAMUSCULAR | Status: AC
  Filled 2021-12-23: qty 1

## 2021-12-23 MED ORDER — GABAPENTIN 300 MG PO CAPS
300.0000 mg | ORAL_CAPSULE | ORAL | Status: AC
  Administered 2021-12-23: 300 mg via ORAL

## 2021-12-23 MED ORDER — KETOROLAC TROMETHAMINE 30 MG/ML IJ SOLN
INTRAMUSCULAR | Status: DC | PRN
  Administered 2021-12-23: 30 mg via INTRAVENOUS

## 2021-12-23 MED ORDER — SODIUM CHLORIDE 0.9% FLUSH
3.0000 mL | INTRAVENOUS | Status: DC | PRN
Start: 2021-12-23 — End: 2021-12-23

## 2021-12-23 MED ORDER — ONDANSETRON HCL 4 MG/2ML IJ SOLN
INTRAMUSCULAR | Status: DC | PRN
  Administered 2021-12-23: 4 mg via INTRAVENOUS

## 2021-12-23 MED ORDER — 0.9 % SODIUM CHLORIDE (POUR BTL) OPTIME
TOPICAL | Status: DC | PRN
  Administered 2021-12-23: 500 mL

## 2021-12-23 MED ORDER — ACETAMINOPHEN 500 MG PO TABS: 1000.0000 mg | ORAL_TABLET | ORAL | Status: DC

## 2021-12-23 MED ORDER — OXYCODONE HCL 5 MG PO TABS: 5.0000 mg | ORAL_TABLET | ORAL | Status: DC | PRN

## 2021-12-23 MED ORDER — FENTANYL CITRATE (PF) 100 MCG/2ML IJ SOLN: 25.0000 ug | INTRAMUSCULAR | Status: DC | PRN

## 2021-12-23 MED ORDER — LACTATED RINGERS IV SOLN: INTRAVENOUS | Status: DC

## 2021-12-23 MED ORDER — GLYCOPYRROLATE 0.2 MG/ML IJ SOLN
INTRAMUSCULAR | Status: DC | PRN
  Administered 2021-12-23: .2 mg via INTRAVENOUS

## 2021-12-23 MED ORDER — OXYCODONE HCL 5 MG PO TABS: 5.0000 mg | ORAL_TABLET | Freq: Once | ORAL | Status: DC | PRN

## 2021-12-23 MED ORDER — SODIUM CHLORIDE 0.9 % IV SOLN
250.0000 mL | INTRAVENOUS | Status: DC | PRN
Start: 2021-12-23 — End: 2021-12-23

## 2021-12-23 MED ORDER — PROPOFOL 1000 MG/100ML IV EMUL
INTRAVENOUS | Status: AC
  Filled 2021-12-23: qty 100

## 2021-12-23 MED ORDER — LIDOCAINE HCL (PF) 2 % IJ SOLN
INTRAMUSCULAR | Status: AC
  Filled 2021-12-23: qty 5

## 2021-12-23 MED ORDER — OXYCODONE HCL 5 MG/5ML PO SOLN: 5.0000 mg | Freq: Once | ORAL | Status: DC | PRN

## 2021-12-23 MED ORDER — SODIUM CHLORIDE 0.9% FLUSH
3.0000 mL | Freq: Two times a day (BID) | INTRAVENOUS | Status: DC
Start: 2021-12-23 — End: 2021-12-23

## 2021-12-23 MED ORDER — ACETAMINOPHEN 500 MG PO TABS
ORAL_TABLET | ORAL | Status: AC
  Filled 2021-12-23: qty 2

## 2021-12-23 MED ORDER — BUPIVACAINE LIPOSOME 1.3 % IJ SUSP: 20.0000 mL | Freq: Once | INTRAMUSCULAR | Status: DC

## 2021-12-23 MED ORDER — PROPOFOL 500 MG/50ML IV EMUL
INTRAVENOUS | Status: DC | PRN
  Administered 2021-12-23: 150 ug/kg/min via INTRAVENOUS

## 2021-12-23 MED ORDER — ACETAMINOPHEN 500 MG PO TABS
1000.0000 mg | ORAL_TABLET | Freq: Once | ORAL | Status: AC
  Administered 2021-12-23: 1000 mg via ORAL

## 2021-12-23 MED ORDER — MIDAZOLAM HCL 2 MG/2ML IJ SOLN
INTRAMUSCULAR | Status: AC
  Filled 2021-12-23: qty 2

## 2021-12-23 MED ORDER — OXYCODONE HCL 5 MG PO TABS
5.0000 mg | ORAL_TABLET | Freq: Four times a day (QID) | ORAL | 0 refills | Status: DC | PRN
  Filled 2021-12-23: qty 30, 4d supply, fill #0

## 2021-12-23 MED ORDER — BUPIVACAINE-EPINEPHRINE (PF) 0.5% -1:200000 IJ SOLN
INTRAMUSCULAR | Status: DC | PRN
  Administered 2021-12-23: 50 mL via SURGICAL_CAVITY

## 2021-12-23 MED ORDER — FENTANYL CITRATE (PF) 100 MCG/2ML IJ SOLN
INTRAMUSCULAR | Status: DC | PRN
  Administered 2021-12-23 (×2): 50 ug via INTRAVENOUS

## 2021-12-23 MED ORDER — ACETIC ACID 5 % SOLN
Status: DC | PRN
  Administered 2021-12-23: 1 via TOPICAL

## 2021-12-23 MED ORDER — ACETAMINOPHEN 325 MG RE SUPP: 650.0000 mg | RECTAL | Status: DC | PRN

## 2021-12-23 SURGICAL SUPPLY — 41 items
BLADE SURG 10 STRL SS (BLADE) IMPLANT
COVER BACK TABLE 60X90IN (DRAPES) ×2 IMPLANT
COVER MAYO STAND STRL (DRAPES) ×2 IMPLANT
DECANTER SPIKE VIAL GLASS SM (MISCELLANEOUS) ×2 IMPLANT
DRAPE LAPAROTOMY 100X72 PEDS (DRAPES) ×2 IMPLANT
DRAPE UTILITY XL STRL (DRAPES) ×2 IMPLANT
DRSG PAD ABDOMINAL 8X10 ST (GAUZE/BANDAGES/DRESSINGS) ×2 IMPLANT
ELECT REM PT RETURN 9FT ADLT (ELECTROSURGICAL) ×2
ELECTRODE REM PT RTRN 9FT ADLT (ELECTROSURGICAL) ×1 IMPLANT
GAUZE 4X4 16PLY ~~LOC~~+RFID DBL (SPONGE) ×2 IMPLANT
GAUZE SPONGE 4X4 12PLY STRL (GAUZE/BANDAGES/DRESSINGS) ×1 IMPLANT
GLOVE BIO SURGEON STRL SZ 6 (GLOVE) ×1 IMPLANT
GLOVE BIO SURGEON STRL SZ 6.5 (GLOVE) ×3 IMPLANT
GLOVE BIOGEL PI IND STRL 6 (GLOVE) IMPLANT
GLOVE BIOGEL PI IND STRL 6.5 (GLOVE) IMPLANT
GLOVE BIOGEL PI IND STRL 7.0 (GLOVE) ×1 IMPLANT
GLOVE BIOGEL PI INDICATOR 6 (GLOVE) ×1
GLOVE BIOGEL PI INDICATOR 6.5 (GLOVE) ×1
GLOVE BIOGEL PI INDICATOR 7.0 (GLOVE) ×1
GLOVE INDICATOR 6.5 STRL GRN (GLOVE) ×2 IMPLANT
GOWN STRL REUS W/TWL LRG LVL3 (GOWN DISPOSABLE) ×2 IMPLANT
GOWN STRL REUS W/TWL XL LVL3 (GOWN DISPOSABLE) ×2 IMPLANT
KIT SIGMOIDOSCOPE (SET/KITS/TRAYS/PACK) IMPLANT
KIT TURNOVER CYSTO (KITS) ×2 IMPLANT
NDL SAFETY ECLIPSE 18X1.5 (NEEDLE) IMPLANT
NEEDLE HYPO 18GX1.5 SHARP (NEEDLE)
NEEDLE HYPO 22GX1.5 SAFETY (NEEDLE) ×2 IMPLANT
NS IRRIG 500ML POUR BTL (IV SOLUTION) ×2 IMPLANT
PACK BASIN DAY SURGERY FS (CUSTOM PROCEDURE TRAY) ×2 IMPLANT
PANTS MESH DISP LRG (UNDERPADS AND DIAPERS) ×2 IMPLANT
PENCIL SMOKE EVACUATOR (MISCELLANEOUS) ×2 IMPLANT
SOL PREP POV-IOD 4OZ 10% (MISCELLANEOUS) ×1 IMPLANT
SPONGE SURGIFOAM ABS GEL 12-7 (HEMOSTASIS) IMPLANT
SUT CHROMIC 2 0 SH (SUTURE) IMPLANT
SUT CHROMIC 3 0 SH 27 (SUTURE) IMPLANT
SYR CONTROL 10ML LL (SYRINGE) ×2 IMPLANT
TOWEL OR 17X26 10 PK STRL BLUE (TOWEL DISPOSABLE) ×3 IMPLANT
TRAY DSU PREP LF (CUSTOM PROCEDURE TRAY) ×2 IMPLANT
TUBE CONNECTING 12X1/4 (SUCTIONS) ×2 IMPLANT
VACUUM HOSE 7/8X10 W/ WAND (MISCELLANEOUS) ×2 IMPLANT
YANKAUER SUCT BULB TIP NO VENT (SUCTIONS) ×2 IMPLANT

## 2021-12-23 NOTE — Transfer of Care (Signed)
Immediate Anesthesia Transfer of Care Note  Patient: Cory Russell  Procedure(s) Performed: LASER ABLATION CONDOLAMATA (Rectum)  Patient Location: PACU  Anesthesia Type:MAC  Level of Consciousness: awake, alert  and oriented  Airway & Oxygen Therapy: Patient Spontanous Breathing and Patient connected to nasal cannula oxygen  Post-op Assessment: Report given to RN and Post -op Vital signs reviewed and stable  Post vital signs: Reviewed and stable  Last Vitals:  Vitals Value Taken Time  BP 113/73 12/23/21 1315  Temp 36.6 C 12/23/21 1315  Pulse 70 12/23/21 1318  Resp 13 12/23/21 1318  SpO2 100 % 12/23/21 1318  Vitals shown include unvalidated device data.  Last Pain:  Vitals:   12/23/21 1315  TempSrc:   PainSc: Asleep      Patients Stated Pain Goal: 6 (03/70/96 4383)  Complications: No notable events documented.

## 2021-12-23 NOTE — Progress Notes (Signed)
Pre-op checklist shows patient refuses blood.  Spoke with Synthia Innocent, pre-op RN and she states that patient did not refuse blood / blood products during pre-op interview.

## 2021-12-23 NOTE — Anesthesia Postprocedure Evaluation (Signed)
Anesthesia Post Note  Patient: Cory Russell  Procedure(s) Performed: LASER ABLATION [place] (Rectum)     Patient location during evaluation: PACU Anesthesia Type: MAC Level of consciousness: awake and alert Pain management: pain level controlled Vital Signs Assessment: post-procedure vital signs reviewed and stable Respiratory status: spontaneous breathing, nonlabored ventilation and respiratory function stable Cardiovascular status: blood pressure returned to baseline Postop Assessment: no apparent nausea or vomiting Anesthetic complications: no Comments: Patient c/o bilateral lower chest/upper abdominal "soreness" in PACU. 12-lead obtained which was unremarkable. Somewhat tender to palpation across lower ribs. Suspect this discomfort is related to positioning. Pain mostly resolved during course of phase 1 PACU stay. Daiva Huge, MD   No notable events documented.  Last Vitals:  Vitals:   12/23/21 1400 12/23/21 1403  BP: (!) 139/98   Pulse: 70 64  Resp: 20 14  Temp:    SpO2: 100% 100%    Last Pain:  Vitals:   12/23/21 1352  TempSrc:   PainSc: [name]

## 2021-12-23 NOTE — Anesthesia Preprocedure Evaluation (Addendum)
Anesthesia Evaluation  Patient identified by MRN, date of birth, ID band Patient awake    Reviewed: Allergy & Precautions, NPO status , Patient's Chart, lab work & pertinent test results  History of Anesthesia Complications Negative for: history of anesthetic complications  Airway Mallampati: II  TM Distance: >3 FB Neck ROM: Full    Dental no notable dental hx.    Pulmonary neg pulmonary ROS,    Pulmonary exam normal        Cardiovascular hypertension, Normal cardiovascular exam     Neuro/Psych Depression negative neurological ROS     GI/Hepatic GERD  ,(+)     substance abuse  cocaine use, Anal condyloma   Endo/Other  negative endocrine ROS  Renal/GU negative Renal ROS  negative genitourinary   Musculoskeletal negative musculoskeletal ROS (+)   Abdominal   Peds  Hematology  (+) HIV,   Anesthesia Other Findings Day of surgery medications reviewed with patient.  Reproductive/Obstetrics negative OB ROS                            Anesthesia Physical Anesthesia Plan  ASA: 2  Anesthesia Plan: MAC   Post-op Pain Management: Tylenol PO (pre-op)* and Toradol IV (intra-op)*   Induction:   PONV Risk Score and Plan: 1 and Treatment may vary due to age or medical condition, Ondansetron, Midazolam and Propofol infusion  Airway Management Planned: Natural Airway and Simple Face Mask  Additional Equipment: None  Intra-op Plan:   Post-operative Plan:   Informed Consent: I have reviewed the patients History and Physical, chart, labs and discussed the procedure including the risks, benefits and alternatives for the proposed anesthesia with the patient or authorized representative who has indicated his/her understanding and acceptance.       Plan Discussed with: CRNA  Anesthesia Plan Comments:        Anesthesia Quick Evaluation

## 2021-12-23 NOTE — Interval H&P Note (Signed)
History and Physical Interval Note:  12/23/2021 12:13 PM  Cory Russell  has presented today for surgery, with the diagnosis of ANAL CONDYLOMA.  The various methods of treatment have been discussed with the patient and family. After consideration of risks, benefits and other options for treatment, the patient has consented to  Procedure(s): LASER ABLATION CONDOLAMATA, POSSIBLE BIOPSY (N/A) as a surgical intervention.  The patient's history has been reviewed, patient examined, no change in status, stable for surgery.  I have reviewed the patient's chart and labs.  Questions were answered to the patient's satisfaction.     Vanita Panda, MD  Colorectal and General Surgery [facility]

## 2021-12-23 NOTE — Discharge Instructions (Addendum)
Post Operative Instructions Following Laser Surgery  Laser treatment of condyloma (warts) is used to vaporize or eliminate the wart.  The laser actually creates a burn effect on the skin to accomplish this.  The following instructions will help in you comfort postoperatively:  First 24 h Remove the dressing this evening after you return home from the hospital Sit in a tub or sitz bath of COOL water for 20 minutes every hour while awake.  After the bath, carefully blot the area dry and place a piece of 100% Cotton with Silvadene on it next to the anal opening.  You may sit on a covered ice pack between the baths as needed.   You should have crushed ice in small amounts until you are able to urinate.  After you urinate, you may drink fluids.  It is normal to not urinate for the first few hours after surgery.  If you become uncomfortable, call the office for instructions.  Take your pain medications as prescribed You may eat whatever you feel like.  Start with a light meal and gradually advance your diet.    Beginning the day after your surgery Sit in a tub of cool to warm water for 15 minutes at least 3 times a day and after bowel movements After the bowel movement, clean with wet cotton, Tucks or baby wipes. Apply Silvadene to a thin piece of cotton and place over the anal opening after your baths.  This will collect any drainage or bleeding.  Drainage is usually a pink to tan color and is normal for the following 2-3 weeks after surgery.  Change to cotton ever 2-3 hours while awake.  Diet Eat a regular diet.  Avoid foods that may constipate you or give you diarrhea.  Avoid foods with seeds, nuts, corn or popcorn. Beginning the day after surgery, drink 6-8 glasses of water a day in addition to your meals.  Limit you caffeine intake to 1-2 servings per day.  Medication Take a fiber supplement (Metamucil, Citrucel, FiberCon) twice a day. Take a stool softener like Colace twice daily Take your  pain medication as directed.  You may use Extra Strength Tylenol instead of your prescribed pain medication to relieve mild discomfort.  Avoid products containing aspirin.  Bowel Habits You should have a bowel movement at least every other day.  If you are constipated, you may take a Fleet enema or 2 Dulcolax tablets.  Call the office if no results occur. You may bear down a normal amount to have a bowel movement without hurting your tissues after the operation.  Activity Walking is encouraged.  You may drive when you are no longer on prescription pain medication.  You may go up and down stairs carefully. No heavy lifting or strenuous activity until after your first post operative appointment Do NOT sit on a rubber ring; instead use a soft pillow if needed.  Call the office if you have any questions or concerns.  Call IMMEDIATELY if you develop: Rectal bleeding Increasing rectal pain Fever greater than 100 F Difficulty urinating    Post Anesthesia Home Care Instructions  Activity: Get plenty of rest for the remainder of the day. A responsible individual must stay with you for 24 hours following the procedure.  For the next 24 hours, DO NOT: -Drive a car -Advertising copywriter -Drink alcoholic beverages -Take any medication unless instructed by your physician -Make any legal decisions or sign important papers.  Meals: Start with liquid foods such as gelatin  or soup. Progress to regular foods as tolerated. Avoid greasy, spicy, heavy foods. If nausea and/or vomiting occur, drink only clear liquids until the nausea and/or vomiting subsides. Call your physician if vomiting continues.  Special Instructions/Symptoms: Your throat may feel dry or sore from the anesthesia or the breathing tube placed in your throat during surgery. If this causes discomfort, gargle with warm salt water. The discomfort should disappear within 24 hours.  Do not take any nonsteroidal anti inflammatories until after  7:00 pm today if needed, Do not take any Tylenol until after 4:15 pm today if needed.   Information for Discharge Teaching: EXPAREL (bupivacaine liposome injectable suspension)   Your surgeon or anesthesiologist gave you EXPAREL(bupivacaine) to help control your pain after surgery.  EXPAREL is a local anesthetic that provides pain relief by numbing the tissue around the surgical site. EXPAREL is designed to release pain medication over time and can control pain for up to 72 hours. Depending on how you respond to EXPAREL, you may require less pain medication during your recovery.  Possible side effects: Temporary loss of sensation or ability to move in the area where bupivacaine was injected. Nausea, vomiting, constipation Rarely, numbness and tingling in your mouth or lips, lightheadedness, or anxiety may occur. Call your doctor right away if you think you may be experiencing any of these sensations, or if you have other questions regarding possible side effects.  Follow all other discharge instructions given to you by your surgeon or nurse. Eat a healthy diet and drink plenty of water or other fluids.  If you return to the hospital for any reason within 96 hours following the administration of EXPAREL, it is important for health care providers to know that you have received this anesthetic. A teal colored band has been placed on your arm with the date, time and amount of EXPAREL you have received in order to alert and inform your health care providers. Please leave this armband in place for the full 96 hours following administration, and then you may remove the band.

## 2021-12-23 NOTE — Op Note (Signed)
12/23/2021  1:07 PM  PATIENT:  Cory Russell  31 y.o. male  Patient Care Team: Lacinda Axon, MD as PCP - General (Internal Medicine) Comer, Okey Regal, MD as Consulting Physician (Infectious Diseases)  PRE-OPERATIVE DIAGNOSIS:  ANAL CONDYLOMA  POST-OPERATIVE DIAGNOSIS:  ANAL CONDYLOMA  PROCEDURE:  LASER ABLATION CONDOLAMATA   Surgeon(s): Leighton Ruff, MD  ASSISTANT: none   ANESTHESIA:   local and MAC  EBL: 25 ml  Total I/O In: 400 [I.V.:400] Out: 25 [Blood:25]  SPECIMEN:  No Specimen  DISPOSITION OF SPECIMEN:  N/A  COUNTS:  YES  PLAN OF CARE: Discharge to home after PACU  PATIENT DISPOSITION:  PACU - hemodynamically stable.  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 MAC anesthesia was smoothly induced.  The patient was then prepped and draped in the usual sterile fashion. A surgical timeout was performed indicating the correct patient, procedure, positioning and preoperative antibioitics. SCDs were noted to be in place and functioning prior to the operation.   After this was completed, a sponge was soaked in 5% acetic acid was placed over the perianal region. This was allowed to soak for 2 minutes.  Next the laser was brought onto the field.  The edges of the operative field were draped with wet towels.  Appropriate ventilation was obtained.  All staff were protected with small particle masks and goggles safe for the laser.  The laser was then activated. All condylomatous lesions were ablated. Hemostasis was then achieved using electrocautery.  A sterile dressing was applied over this. The patient was then awakened from anesthesia and sent to the postanesthesia care unit in stable  condition. All counts were correct operating room staff.   Rosario Adie, MD  Colorectal and [facility]

## 2021-12-24 ENCOUNTER — Encounter (HOSPITAL_BASED_OUTPATIENT_CLINIC_OR_DEPARTMENT_OTHER): Payer: Self-pay | Admitting: General Surgery

## 2022-03-28 ENCOUNTER — Ambulatory Visit: Payer: No Typology Code available for payment source | Admitting: Internal Medicine

## 2022-03-30 ENCOUNTER — Ambulatory Visit: Payer: No Typology Code available for payment source | Admitting: Internal Medicine

## 2022-04-20 ENCOUNTER — Other Ambulatory Visit: Payer: Self-pay

## 2022-04-20 DIAGNOSIS — B20 Human immunodeficiency virus [HIV] disease: Secondary | ICD-10-CM

## 2022-04-20 DIAGNOSIS — Z79899 Other long term (current) drug therapy: Secondary | ICD-10-CM

## 2022-04-20 DIAGNOSIS — Z113 Encounter for screening for infections with a predominantly sexual mode of transmission: Secondary | ICD-10-CM

## 2022-04-21 ENCOUNTER — Other Ambulatory Visit (HOSPITAL_COMMUNITY)
Admission: RE | Admit: 2022-04-21 | Discharge: 2022-04-21 | Disposition: A | Discharge status: Home / Self Care | Payer: No Typology Code available for payment source | Source: Ambulatory Visit | Attending: Internal Medicine | Admitting: Internal Medicine

## 2022-04-21 ENCOUNTER — Other Ambulatory Visit: Payer: No Typology Code available for payment source

## 2022-04-21 ENCOUNTER — Other Ambulatory Visit: Payer: Self-pay

## 2022-04-21 DIAGNOSIS — Z113 Encounter for screening for infections with a predominantly sexual mode of transmission: Secondary | ICD-10-CM | POA: Diagnosis present

## 2022-04-21 DIAGNOSIS — B20 Human immunodeficiency virus [HIV] disease: Secondary | ICD-10-CM | POA: Insufficient documentation

## 2022-04-21 DIAGNOSIS — Z79899 Other long term (current) drug therapy: Secondary | ICD-10-CM

## 2022-04-22 LAB — T-HELPER CELL (CD4) - (RCID CLINIC ONLY)
CD4 % Helper T Cell: 10 % — ABNORMAL LOW (ref 33–65)
CD4 T Cell Abs: 163 /uL — ABNORMAL LOW (ref 400–1790)

## 2022-04-22 LAB — URINE CYTOLOGY ANCILLARY ONLY
Chlamydia: NEGATIVE
Comment: NEGATIVE
Comment: NORMAL
Neisseria Gonorrhea: NEGATIVE

## 2022-04-25 LAB — RPR: RPR Ser Ql: REACTIVE — AB

## 2022-04-25 LAB — CBC WITH DIFFERENTIAL/PLATELET
Absolute Monocytes: 530 cells/uL (ref 200–950)
Basophils Absolute: 20 cells/uL (ref 0–200)
Basophils Relative: 0.4 %
Eosinophils Absolute: 20 cells/uL (ref 15–500)
Eosinophils Relative: 0.4 %
HCT: 39.8 % (ref 38.5–50.0)
Hemoglobin: 13.2 g/dL (ref 13.2–17.1)
Lymphs Abs: 2071 cells/uL (ref 850–3900)
MCH: 29.1 pg (ref 27.0–33.0)
MCHC: 33.2 g/dL (ref 32.0–36.0)
MCV: 87.7 fL (ref 80.0–100.0)
MPV: 9.2 fL (ref 7.5–12.5)
Monocytes Relative: 10.4 %
Neutro Abs: 2458 cells/uL (ref 1500–7800)
Neutrophils Relative %: 48.2 %
Platelets: 273 10*3/uL (ref 140–400)
RBC: 4.54 10*6/uL (ref 4.20–5.80)
RDW: 14.8 % (ref 11.0–15.0)
Total Lymphocyte: 40.6 %
WBC: 5.1 10*3/uL (ref 3.8–10.8)

## 2022-04-25 LAB — COMPLETE METABOLIC PANEL WITH GFR
AG Ratio: 1.1 (calc) (ref 1.0–2.5)
ALT: 19 U/L (ref 9–46)
AST: 23 U/L (ref 10–40)
Albumin: 4.3 g/dL (ref 3.6–5.1)
Alkaline phosphatase (APISO): 71 U/L (ref 36–130)
BUN: 12 mg/dL (ref 7–25)
CO2: 31 mmol/L (ref 20–32)
Calcium: 10 mg/dL (ref 8.6–10.3)
Chloride: 102 mmol/L (ref 98–110)
Creat: 0.98 mg/dL (ref 0.60–1.26)
Globulin: 4 g/dL (calc) — ABNORMAL HIGH (ref 1.9–3.7)
Glucose, Bld: 80 mg/dL (ref 65–99)
Potassium: 4 mmol/L (ref 3.5–5.3)
Sodium: 139 mmol/L (ref 135–146)
Total Bilirubin: 0.4 mg/dL (ref 0.2–1.2)
Total Protein: 8.3 g/dL — ABNORMAL HIGH (ref 6.1–8.1)
eGFR: 105 mL/min/{1.73_m2} (ref >=60)

## 2022-04-25 LAB — RPR TITER: RPR Titer: 1:128 {titer} — ABNORMAL HIGH

## 2022-04-25 LAB — LIPID PANEL
Cholesterol: 149 mg/dL (ref <200)
HDL: 36 mg/dL — ABNORMAL LOW (ref >=40)
LDL Cholesterol (Calc): 97 mg/dL (calc)
Non-HDL Cholesterol (Calc): 113 mg/dL (calc) (ref <130)
Total CHOL/HDL Ratio: 4.1 (calc) (ref <5.0)
Triglycerides: 69 mg/dL (ref <150)

## 2022-04-25 LAB — HIV-1 RNA QUANT-NO REFLEX-BLD
HIV 1 RNA Quant: 124000 Copies/mL — ABNORMAL HIGH
HIV-1 RNA Quant, Log: 5.09 Log cps/mL — ABNORMAL HIGH

## 2022-04-25 LAB — FLUORESCENT TREPONEMAL AB(FTA)-IGG-BLD: Fluorescent Treponemal ABS: REACTIVE — AB

## 2022-04-26 ENCOUNTER — Telehealth: Payer: Self-pay

## 2022-04-26 NOTE — Telephone Encounter (Signed)
-----   Message from Gardiner Barefoot, MD sent at 04/26/2022 10:53 AM EST ----- He is positive for syphilis and needs bicillin 2.4 million units x 1.   thanks

## 2022-04-26 NOTE — Telephone Encounter (Signed)
Called patient at mobile number - voicemail box full unable to leave a message.  Called home number - left voicemail asking patient to return my call.   Tedi Hughson Lesli Albee, CMA

## 2022-04-27 NOTE — Telephone Encounter (Signed)
Called patient on both home and mobile numbers, no answer. Voicemail left with previous attempt. Will try to reach patient via MyChart.   [name], RN

## 2022-05-02 NOTE — Telephone Encounter (Signed)
Attempted to call patient today regarding pos syphillis results. Not able to reach him at this time. Voicemail is full and not accepting new messages. Patient is scheduled for follow up on 11/30 with Dr. Luciana Axe. Juanita Laster, RMA

## 2022-05-05 ENCOUNTER — Telehealth: Payer: Self-pay

## 2022-05-05 ENCOUNTER — Ambulatory Visit: Payer: No Typology Code available for payment source | Admitting: Internal Medicine

## 2022-05-05 NOTE — Telephone Encounter (Signed)
Called patient to reschedule missed appointment and notify patient per Dr. Luciana Axe that he is positive for syphilis and need to come in to be treated with bicillin 2.4 million units x1. Patient verbalized understanding.Patient has a nurse visit on 12/4 for treatment and f/u with Dr. Luciana Axe on 06/07/2022.

## 2022-05-09 ENCOUNTER — Other Ambulatory Visit: Payer: Self-pay

## 2022-05-09 ENCOUNTER — Ambulatory Visit (INDEPENDENT_AMBULATORY_CARE_PROVIDER_SITE_OTHER): Payer: No Typology Code available for payment source

## 2022-05-09 DIAGNOSIS — A539 Syphilis, unspecified: Secondary | ICD-10-CM

## 2022-05-09 MED ORDER — PENICILLIN G BENZATHINE 1200000 UNIT/2ML IM SUSY
2.4000 10*6.[IU] | PREFILLED_SYRINGE | Freq: Once | INTRAMUSCULAR | Status: AC
  Administered 2022-05-09: 2.4 10*6.[IU] via INTRAMUSCULAR

## 2022-06-07 ENCOUNTER — Ambulatory Visit: Payer: No Typology Code available for payment source | Admitting: Internal Medicine

## 2022-06-14 ENCOUNTER — Other Ambulatory Visit: Payer: Self-pay

## 2022-06-14 ENCOUNTER — Other Ambulatory Visit: Payer: No Typology Code available for payment source

## 2022-06-14 DIAGNOSIS — B20 Human immunodeficiency virus [HIV] disease: Secondary | ICD-10-CM

## 2022-06-14 DIAGNOSIS — Z113 Encounter for screening for infections with a predominantly sexual mode of transmission: Secondary | ICD-10-CM

## 2022-06-15 LAB — T-HELPER CELLS (CD4) COUNT (NOT AT ARMC)
Absolute CD4: 238 cells/uL — ABNORMAL LOW (ref 490–1740)
CD4 T Helper %: 12 % — ABNORMAL LOW (ref 30–61)
Total lymphocyte count: 2004 cells/uL (ref 850–3900)

## 2022-06-17 ENCOUNTER — Ambulatory Visit: Payer: No Typology Code available for payment source | Admitting: Internal Medicine

## 2022-06-17 ENCOUNTER — Other Ambulatory Visit: Payer: Self-pay

## 2022-06-17 ENCOUNTER — Other Ambulatory Visit (HOSPITAL_COMMUNITY): Payer: Self-pay

## 2022-06-17 ENCOUNTER — Encounter: Payer: Self-pay | Admitting: Internal Medicine

## 2022-06-17 VITALS — BP 120/65 | HR 66 | Temp 97.9°F | Resp 16 | Ht 67.0 in | Wt 126.0 lb

## 2022-06-17 DIAGNOSIS — B20 Human immunodeficiency virus [HIV] disease: Secondary | ICD-10-CM | POA: Diagnosis not present

## 2022-06-17 DIAGNOSIS — Z113 Encounter for screening for infections with a predominantly sexual mode of transmission: Secondary | ICD-10-CM

## 2022-06-17 LAB — COMPLETE METABOLIC PANEL WITH GFR
AG Ratio: 1.1 (calc) (ref 1.0–2.5)
ALT: 17 U/L (ref 9–46)
AST: 18 U/L (ref 10–40)
Albumin: 4.3 g/dL (ref 3.6–5.1)
Alkaline phosphatase (APISO): 67 U/L (ref 36–130)
BUN: 19 mg/dL (ref 7–25)
CO2: 27 mmol/L (ref 20–32)
Calcium: 9.9 mg/dL (ref 8.6–10.3)
Chloride: 105 mmol/L (ref 98–110)
Creat: 0.94 mg/dL (ref 0.60–1.26)
Globulin: 3.9 g/dL (calc) — ABNORMAL HIGH (ref 1.9–3.7)
Glucose, Bld: 86 mg/dL (ref 65–99)
Potassium: 4.9 mmol/L (ref 3.5–5.3)
Sodium: 139 mmol/L (ref 135–146)
Total Bilirubin: 0.3 mg/dL (ref 0.2–1.2)
Total Protein: 8.2 g/dL — ABNORMAL HIGH (ref 6.1–8.1)
eGFR: 110 mL/min/{1.73_m2} (ref >=60)

## 2022-06-17 LAB — CBC WITH DIFFERENTIAL/PLATELET
Absolute Monocytes: 388 cells/uL (ref 200–950)
Basophils Absolute: 8 cells/uL (ref 0–200)
Basophils Relative: 0.2 %
Eosinophils Absolute: 20 cells/uL (ref 15–500)
Eosinophils Relative: 0.5 %
HCT: 38.5 % (ref 38.5–50.0)
Hemoglobin: 12.3 g/dL — ABNORMAL LOW (ref 13.2–17.1)
Lymphs Abs: 2076 cells/uL (ref 850–3900)
MCH: 27.9 pg (ref 27.0–33.0)
MCHC: 31.9 g/dL — ABNORMAL LOW (ref 32.0–36.0)
MCV: 87.3 fL (ref 80.0–100.0)
MPV: 9.1 fL (ref 7.5–12.5)
Monocytes Relative: 9.7 %
Neutro Abs: 1508 cells/uL (ref 1500–7800)
Neutrophils Relative %: 37.7 %
Platelets: 220 10*3/uL (ref 140–400)
RBC: 4.41 10*6/uL (ref 4.20–5.80)
RDW: 14.5 % (ref 11.0–15.0)
Total Lymphocyte: 51.9 %
WBC: 4 10*3/uL (ref 3.8–10.8)

## 2022-06-17 LAB — HIV-1 RNA QUANT-NO REFLEX-BLD
HIV 1 RNA Quant: 181000 Copies/mL — ABNORMAL HIGH
HIV-1 RNA Quant, Log: 5.26 Log cps/mL — ABNORMAL HIGH

## 2022-06-17 LAB — T PALLIDUM AB: T Pallidum Abs: POSITIVE — AB

## 2022-06-17 LAB — RPR: RPR Ser Ql: REACTIVE — AB

## 2022-06-17 LAB — RPR TITER: RPR Titer: 1:32 {titer} — ABNORMAL HIGH

## 2022-06-17 MED ORDER — DARUNAVIR 800 MG PO TABS
800.0000 mg | ORAL_TABLET | Freq: Every day | ORAL | 11 refills | Status: DC
  Filled 2022-06-17 – 2022-06-28 (×2): qty 30, 30d supply, fill #0
  Filled 2023-01-10: qty 30, 30d supply, fill #1

## 2022-06-17 MED ORDER — ELVITEG-COBIC-EMTRICIT-TENOFAF 150-150-200-10 MG PO TABS
1.0000 | ORAL_TABLET | Freq: Every day | ORAL | 11 refills | Status: DC
  Filled 2022-06-17 – 2022-06-28 (×2): qty 30, 30d supply, fill #0
  Filled 2023-01-10: qty 30, 30d supply, fill #1

## 2022-06-17 NOTE — Assessment & Plan Note (Signed)
He was recently treated for syphilis and repeat titer reassuring for cure I will repeat other STI testing today with swabs and urine

## 2022-06-17 NOTE — Progress Notes (Signed)
   Subjective:    Patient ID: Cory Russell, male    DOB: March 31, 1991, 32 y.o.   MRN: [id number]  HPI Cory Russell is here for follow up of HIV He is back in care now after more than 1 year and has been off of his medicaiton. 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  He is also suffering from a viral syndrome with congestion, sore throat.  He is interested in getting back on medication today.  He has been following with Dr. Marcello Moores this past year and underwent excision and ablation of anal condyloma in January 2023.     Review of Systems  Constitutional:  Positive for activity change. Negative for fatigue.  HENT:  Positive for congestion, sore throat and trouble swallowing.   Respiratory:  Negative for cough, shortness of breath and wheezing.        Objective:   Physical Exam Eyes:     General: No scleral icterus. Pulmonary:     Effort: Pulmonary effort is normal. No respiratory distress.  Skin:    Findings: No rash.  Neurological:     Mental Status: He is alert.   SH: no tobacco      Assessment & Plan:

## 2022-06-17 NOTE — Assessment & Plan Note (Signed)
Poorly controlled but now back in care and I will have him restart the genvoya and prezista again which he tolerated fine. He understands the need to remain in care CD4 is a bit over 200 I will have him follow up in 2 months and will recheck his immune reconstitution then.

## 2022-06-21 ENCOUNTER — Telehealth: Payer: Self-pay

## 2022-06-21 NOTE — Telephone Encounter (Signed)
Called patient to relay results and schedule for treatment, no answer. Left HIPAA compliant voicemail requesting callback.   Thelmer Legler D Aleesha Ringstad, RN  

## 2022-06-21 NOTE — Telephone Encounter (Signed)
-----  Message from Thayer Headings, MD sent at 06/21/2022  2:13 PM EST ----- Positive for GC and chlamydia and needs 500 mg IM ceftriaxone and 1000 mg oral azithromycin.  thanks

## 2022-06-22 NOTE — Telephone Encounter (Signed)
Attempted to call patient to relay lab results. Not able to reach him at this time. Voicemail is full and not accepting new messages.  Will send mychart message. Leatrice Jewels, RMA

## 2022-06-23 NOTE — Telephone Encounter (Signed)
Patient returned call, relayed positive gonorrhea and chlamydia results and need for treatment. Advised patient no sex until treatment is completed plus an additional 10 days and instructed to notify sexual partners for testing and treatment. Patient verbalized understanding and has no further questions.    Scheduled for tomorrow at 11:30.   Beryle Flock, RN

## 2022-06-24 ENCOUNTER — Ambulatory Visit (INDEPENDENT_AMBULATORY_CARE_PROVIDER_SITE_OTHER): Payer: No Typology Code available for payment source

## 2022-06-24 ENCOUNTER — Other Ambulatory Visit: Payer: Self-pay

## 2022-06-24 DIAGNOSIS — A549 Gonococcal infection, unspecified: Secondary | ICD-10-CM | POA: Diagnosis not present

## 2022-06-24 DIAGNOSIS — A749 Chlamydial infection, unspecified: Secondary | ICD-10-CM

## 2022-06-24 MED ORDER — AZITHROMYCIN 250 MG PO TABS
1000.0000 mg | ORAL_TABLET | Freq: Once | ORAL | Status: AC
  Administered 2022-06-24: 1000 mg via ORAL

## 2022-06-24 MED ORDER — CEFTRIAXONE SODIUM 500 MG IJ SOLR
500.0000 mg | Freq: Once | INTRAMUSCULAR | Status: AC
  Administered 2022-06-24: 500 mg via INTRAMUSCULAR

## 2022-06-24 NOTE — Progress Notes (Signed)
Patient here for treatment of gonorrhea and chlamydia. Reviewed and verified allergies.   Offered condoms, encouraged patient to refrain from sex for 7 days.   Beryle Flock, RN

## 2022-06-28 ENCOUNTER — Telehealth: Payer: Self-pay

## 2022-06-28 ENCOUNTER — Other Ambulatory Visit (HOSPITAL_COMMUNITY): Payer: Self-pay

## 2022-06-28 NOTE — Telephone Encounter (Signed)
[facility] Patient Advocate Encounter   Was successful in obtaining a [place] copay card for [employer].  This copay card will make the patients copay $0.00.  I have spoken with the patient.    The billing information is as follows and has been shared with [place].        Cory Russell, [facility] Patient [facility] Phone: [phone] Fax:  [phone]

## 2022-06-30 LAB — CT/NG RNA, TMA RECTAL
Chlamydia Trachomatis RNA: DETECTED — AB
Neisseria Gonorrhoeae RNA: DETECTED — AB

## 2022-06-30 LAB — C. TRACHOMATIS/N. GONORRHOEAE RNA
C. trachomatis RNA, TMA: NOT DETECTED
N. gonorrhoeae RNA, TMA: NOT DETECTED

## 2022-06-30 LAB — GC/CHLAMYDIA PROBE, AMP (THROAT)
Chlamydia trachomatis RNA: NOT DETECTED
Neisseria gonorrhoeae RNA: NOT DETECTED

## 2022-07-20 ENCOUNTER — Other Ambulatory Visit (HOSPITAL_COMMUNITY): Payer: Self-pay

## 2022-07-22 ENCOUNTER — Other Ambulatory Visit (HOSPITAL_COMMUNITY): Payer: Self-pay

## 2022-07-25 ENCOUNTER — Other Ambulatory Visit (HOSPITAL_COMMUNITY): Payer: Self-pay

## 2022-08-09 ENCOUNTER — Other Ambulatory Visit: Payer: Self-pay

## 2022-08-09 ENCOUNTER — Other Ambulatory Visit: Payer: No Typology Code available for payment source

## 2022-08-09 DIAGNOSIS — Z113 Encounter for screening for infections with a predominantly sexual mode of transmission: Secondary | ICD-10-CM

## 2022-08-09 DIAGNOSIS — B20 Human immunodeficiency virus [HIV] disease: Secondary | ICD-10-CM

## 2022-08-23 ENCOUNTER — Ambulatory Visit: Payer: No Typology Code available for payment source | Admitting: Internal Medicine

## 2022-09-06 ENCOUNTER — Other Ambulatory Visit: Payer: Self-pay

## 2022-09-06 ENCOUNTER — Other Ambulatory Visit (HOSPITAL_COMMUNITY): Payer: Self-pay

## 2022-09-06 ENCOUNTER — Telehealth: Payer: Self-pay

## 2022-09-06 NOTE — Telephone Encounter (Signed)
Detectable Viral Load Intervention   Most recent VL:  HIV 1 RNA Quant  Date Value Ref Range Status  06/14/2022 181,000 (H) Copies/mL Final  04/21/2022 124,000 (H) Copies/mL Final  05/10/2021 401 (H) Copies/mL Final    Current ART regimen: Prezista and Genvoya  Appointment status: patient does not have future appointment scheduled   Called patient to discuss medication adherence and possible barriers to care. Unable tot reach patient at this time.   Medication last dispensed (per chart review):   Dispensed Days Supply Quantity Provider Pharmacy  darunavir (PREZISTA) 800 MG tablet 06/29/2022 30 30 tablet Comer, Okey Regal, MD [place]...    Dispensed Days Supply Quantity Provider Pharmacy  elvitegravir-cobicistat-emtricitabine-tenofovir (GENVOYA) 150-150-200-10 MG TABS tablet 06/29/2022 30 30 tablet Comer, Okey Regal, MD [facility]   Medication Adherence  Unable to assess at this time.  Barriers to Care  Unable to assess at this time.    Interventions: Staff contacted [facility] for any other alternative contact information. None available at this time as they have also been unsuccessful at trying to reach patient for medication delivery.   Staff will also patient a Mychart message to connect for an appointment.   Eugenia Mcalpine, LPN

## 2022-09-08 ENCOUNTER — Telehealth: Payer: Self-pay

## 2022-09-08 NOTE — Telephone Encounter (Signed)
Detectable VL intervention ---attempt to schedule appointment ----no answer.

## 2022-10-13 ENCOUNTER — Ambulatory Visit: Payer: No Typology Code available for payment source | Admitting: Internal Medicine

## 2022-10-17 ENCOUNTER — Telehealth: Payer: Self-pay

## 2022-10-17 NOTE — Telephone Encounter (Signed)
Detectable Viral Load Intervention   Most recent VL:  HIV 1 RNA Quant  Date Value Ref Range Status  06/14/2022 181,000 (H) Copies/mL Final  04/21/2022 124,000 (H) Copies/mL Final  05/10/2021 401 (H) Copies/mL Final    Current ART regimen: Prezista and Genvoya  Appointment status: patient does not have future appointment scheduled   Called patient to discuss medication adherence and possible barriers to care.   Medication last dispensed (per chart review):   Dispensed Days Supply Quantity Provider Pharmacy  elvitegravir-cobicistat-emtricitabine-tenofovir (GENVOYA) 150-150-200-10 MG TABS tablet 06/29/2022 30 30 tablet Comer, Belia Heman, MD [place] [facility]    Dispensed Days Supply Quantity Provider Pharmacy  darunavir (PREZISTA) 800 MG tablet 06/29/2022 30 30 tablet Comer, Belia Heman, MD [place] [facility]   Medication Adherence   Unable to assess.    Barriers to Care   Unable to assess.    Interventions: Unable to reach patient at this time. Left HIPAA compliant voicemail requesting callback.   [name], RN

## 2022-10-18 ENCOUNTER — Telehealth: Payer: Self-pay | Admitting: Pharmacist

## 2022-10-18 NOTE — Telephone Encounter (Signed)
[facility] and clinic pharmacy staff have been trying to contact patient for refills of Genvoya + Prezista with no luck.  Patient last filled on 06/29/22.   Cory Russell L. Dallana Mavity, PharmD [facility] Clinical Pharmacist Practitioner

## 2022-10-31 ENCOUNTER — Encounter: Payer: Self-pay | Admitting: *Deleted

## 2022-11-15 ENCOUNTER — Other Ambulatory Visit: Payer: No Typology Code available for payment source

## 2022-11-15 ENCOUNTER — Other Ambulatory Visit: Payer: Self-pay

## 2022-11-15 ENCOUNTER — Other Ambulatory Visit (HOSPITAL_COMMUNITY)
Admission: RE | Admit: 2022-11-15 | Discharge: 2022-11-15 | Disposition: A | Discharge status: Home / Self Care | Payer: No Typology Code available for payment source | Source: Ambulatory Visit | Attending: Internal Medicine | Admitting: Internal Medicine

## 2022-11-15 DIAGNOSIS — Z113 Encounter for screening for infections with a predominantly sexual mode of transmission: Secondary | ICD-10-CM

## 2022-11-15 DIAGNOSIS — B20 Human immunodeficiency virus [HIV] disease: Secondary | ICD-10-CM

## 2022-11-15 LAB — CBC WITH DIFFERENTIAL/PLATELET
MCH: 28.9 pg (ref 27.0–33.0)
MCHC: 33.3 g/dL (ref 32.0–36.0)
Neutro Abs: 2875 cells/uL (ref 1500–7800)
Platelets: 278 10*3/uL (ref 140–400)
WBC: 5 10*3/uL (ref 3.8–10.8)

## 2022-11-16 LAB — T-HELPER CELL (CD4) - (RCID CLINIC ONLY)
CD4 % Helper T Cell: 9 % — ABNORMAL LOW (ref 33–65)
CD4 T Cell Abs: 129 /uL — ABNORMAL LOW (ref 400–1790)

## 2022-11-16 LAB — URINE CYTOLOGY ANCILLARY ONLY
Chlamydia: NEGATIVE
Comment: NEGATIVE
Comment: NORMAL
Neisseria Gonorrhea: NEGATIVE

## 2022-11-16 LAB — COMPLETE METABOLIC PANEL WITH GFR
AG Ratio: 1.2 (calc) (ref 1.0–2.5)
Albumin: 4.4 g/dL (ref 3.6–5.1)
Alkaline phosphatase (APISO): 72 U/L (ref 36–130)
BUN: 13 mg/dL (ref 7–25)
CO2: 25 mmol/L (ref 20–32)
Glucose, Bld: 103 mg/dL — ABNORMAL HIGH (ref 65–99)

## 2022-11-17 ENCOUNTER — Other Ambulatory Visit: Payer: Self-pay | Admitting: Family

## 2022-11-17 ENCOUNTER — Other Ambulatory Visit (HOSPITAL_COMMUNITY): Payer: Self-pay

## 2022-11-17 ENCOUNTER — Telehealth: Payer: Self-pay

## 2022-11-17 MED ORDER — SULFAMETHOXAZOLE-TRIMETHOPRIM 800-160 MG PO TABS: 1.0000 | ORAL_TABLET | Freq: Every day | ORAL | 3 refills | Status: DC

## 2022-11-17 MED ORDER — SULFAMETHOXAZOLE-TRIMETHOPRIM 800-160 MG PO TABS
1.0000 | ORAL_TABLET | Freq: Every day | ORAL | 3 refills | Status: DC
  Filled 2022-11-17: qty 30, 30d supply, fill #0

## 2022-11-17 NOTE — Telephone Encounter (Signed)
Called patient regarding lab results. Understands that since cd4 is <200 provider is sending Bactrim to help prevent oppurtunistic infections. Encouraged patient to avoid missing doses on Genvoya. Verbalized understanding. Would like medication sent to [facility] in [place].  Will resend rx.  Juanita Laster, RMA

## 2022-11-17 NOTE — Telephone Encounter (Signed)
-----   Message from Veryl Speak, FNP sent at 11/17/2022  9:16 AM EDT ----- Mr. Hillman CD4 count is <200 with most recent lab work. I sent a MyChart but please ensure he is aware of his new Bactrim prescription to take in addition to his ART. Thanks!

## 2022-11-18 ENCOUNTER — Other Ambulatory Visit (HOSPITAL_COMMUNITY): Payer: Self-pay

## 2022-11-18 LAB — CBC WITH DIFFERENTIAL/PLATELET
Absolute Monocytes: 445 cells/uL (ref 200–950)
Basophils Absolute: 10 cells/uL (ref 0–200)
Basophils Relative: 0.2 %
Eosinophils Absolute: 0 cells/uL — ABNORMAL LOW (ref 15–500)
Eosinophils Relative: 0 %
HCT: 36.9 % — ABNORMAL LOW (ref 38.5–50.0)
Hemoglobin: 12.3 g/dL — ABNORMAL LOW (ref 13.2–17.1)
Lymphs Abs: 1670 cells/uL (ref 850–3900)
MCV: 86.8 fL (ref 80.0–100.0)
MPV: 9.3 fL (ref 7.5–12.5)
Monocytes Relative: 8.9 %
Neutrophils Relative %: 57.5 %
RBC: 4.25 10*6/uL (ref 4.20–5.80)
RDW: 14 % (ref 11.0–15.0)
Total Lymphocyte: 33.4 %

## 2022-11-18 LAB — COMPLETE METABOLIC PANEL WITH GFR
ALT: 14 U/L (ref 9–46)
AST: 18 U/L (ref 10–40)
Calcium: 9.7 mg/dL (ref 8.6–10.3)
Chloride: 102 mmol/L (ref 98–110)
Creat: 0.85 mg/dL (ref 0.60–1.26)
Globulin: 3.7 g/dL (calc) (ref 1.9–3.7)
Potassium: 3.8 mmol/L (ref 3.5–5.3)
Sodium: 136 mmol/L (ref 135–146)
Total Bilirubin: 0.3 mg/dL (ref 0.2–1.2)
Total Protein: 8.1 g/dL (ref 6.1–8.1)
eGFR: 118 mL/min/{1.73_m2} (ref >=60)

## 2022-11-18 LAB — RPR: RPR Ser Ql: REACTIVE — AB

## 2022-11-18 LAB — T PALLIDUM AB: T Pallidum Abs: POSITIVE — AB

## 2022-11-18 LAB — HIV-1 RNA QUANT-NO REFLEX-BLD
HIV 1 RNA Quant: 241000 Copies/mL — ABNORMAL HIGH
HIV-1 RNA Quant, Log: 5.38 Log cps/mL — ABNORMAL HIGH

## 2022-11-18 LAB — RPR TITER: RPR Titer: 1:16 {titer} — ABNORMAL HIGH

## 2022-11-30 ENCOUNTER — Telehealth: Payer: Self-pay

## 2022-11-30 NOTE — Telephone Encounter (Signed)
Patient called office requesting to speak with provider about disability paperwork. States that since he has HIV he would like to apply for disability. Informed patient that usually HIV is not approved diagnosis for disability, but can discuss this with provider at upcoming appointment.  Verbalized understanding. Juanita Laster, RMA

## 2023-01-04 ENCOUNTER — Encounter: Payer: Self-pay | Admitting: Internal Medicine

## 2023-01-04 ENCOUNTER — Other Ambulatory Visit: Payer: Self-pay

## 2023-01-04 ENCOUNTER — Ambulatory Visit (INDEPENDENT_AMBULATORY_CARE_PROVIDER_SITE_OTHER): Payer: No Typology Code available for payment source | Admitting: Internal Medicine

## 2023-01-04 VITALS — BP 120/78 | HR 85 | Temp 97.7°F | Resp 16 | Wt 119.0 lb

## 2023-01-04 DIAGNOSIS — B20 Human immunodeficiency virus [HIV] disease: Secondary | ICD-10-CM

## 2023-01-04 DIAGNOSIS — R110 Nausea: Secondary | ICD-10-CM

## 2023-01-04 DIAGNOSIS — R630 Anorexia: Secondary | ICD-10-CM

## 2023-01-04 MED ORDER — MEGESTROL ACETATE 400 MG/10ML PO SUSP: 400.0000 mg | Freq: Every day | ORAL | 3 refills | Status: DC

## 2023-01-04 MED ORDER — ONDANSETRON HCL 4 MG PO TABS: 4.0000 mg | ORAL_TABLET | Freq: Every day | ORAL | 5 refills | Status: DC | PRN

## 2023-01-05 ENCOUNTER — Encounter: Payer: Self-pay | Admitting: Internal Medicine

## 2023-01-05 DIAGNOSIS — R110 Nausea: Secondary | ICD-10-CM | POA: Insufficient documentation

## 2023-01-05 NOTE — Progress Notes (Signed)
   Subjective:    Patient ID: Cory Russell, male    DOB: [birth year], 32 y.o.   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  He returned in January this year and back in care but still with poor compliance and now here today for follow up.  Unfortunately, he tells me he has been taking his medications every other day since it makes him feel nauseous.  Poor po intake. Has lost weight   Review of Systems  Constitutional:  Positive for unexpected weight change. Negative for fatigue.  Gastrointestinal:  Positive for nausea. Negative for diarrhea.  Skin:  Negative for rash.       Objective:   Physical Exam Eyes:     General: No scleral icterus. Pulmonary:     Effort: Pulmonary effort is normal.  Skin:    Findings: No rash.  Neurological:     Mental Status: He is alert.    SH: no tobacco       Assessment & Plan:

## 2023-01-05 NOTE — Assessment & Plan Note (Addendum)
I had a long discussion with him again regarding the need to take his medication effectively and the concerns of resistance when taken every other day.  I will need to check a genotype today and he will start with daily medications.  I also discussed the option of just stopping the Bactrim to limit the medication and focus on the ARVs, which he will do.   Labs today and he will return in 2-3 weeks.   I have personally spent 45 minutes involved in face-to-face and non-face-to-face activities for this patient on the day of the visit. Professional time spent includes the following activities: Preparing to see the patient (review of tests), Obtaining and/or reviewing separately obtained history (admission/discharge record), Performing a medically appropriate examination and/or evaluation , Ordering medications/tests/procedures, referring and communicating with other health care professionals, Documenting clinical information in the EMR, Independently interpreting results (not separately reported), Communicating results to the patient/family/caregiver, Counseling and educating the patient/family/caregiver and Care coordination (not separately reported).

## 2023-01-05 NOTE — Assessment & Plan Note (Signed)
Have prescribed zofran

## 2023-01-05 NOTE — Assessment & Plan Note (Signed)
I refilled the megace

## 2023-01-10 ENCOUNTER — Telehealth: Payer: Self-pay

## 2023-01-10 ENCOUNTER — Other Ambulatory Visit: Payer: Self-pay

## 2023-01-10 NOTE — Telephone Encounter (Signed)
Detectable Viral Load Intervention (DVL)  Most recent VL:  HIV 1 RNA Quant  Date Value Ref Range Status  11/15/2022 241,000 (H) Copies/mL Final  06/14/2022 181,000 (H) Copies/mL Final  04/21/2022 124,000 (H) Copies/mL Final    Last Clinic Visit: 01/04/23  Current ART regimen: Prezista and Genvoya  Appointment status: patient has future appointment scheduled  Medication last dispensed (per chart review):    Dispensed Days Supply Quantity Provider Pharmacy   elvitegravir-cobicistat-emtricitabine-tenofovir (GENVOYA) 150-150-200-10 MG TABS tablet 06/29/2022 30 30 tablet Comer, Belia Heman, MD [place] [facility]  GENVOYA (150/150/200/TAF10) TAB 09/25/2021 30 30 each Comer, Belia Heman, MD [facility] #...    Dispensed Days Supply Quantity Provider Pharmacy  darunavir (PREZISTA) 800 MG tablet 06/29/2022 30 30 tablet Comer, Belia Heman, MD [place] [facility]  PREZISTA 800MG  TABLETS 09/25/2021 30 30 each Comer, Belia Heman, MD [facility] #...    Interventions   Called patient to discuss medication adherence and possible barriers to care.   Amuel was able to get his ondansetron and Megace from local [facility] in [place]. He would like his [place] and Prezista mailed to him. Spoke with [facility] to set up delivery, confirmed address with Lyda Jester.   Reminded him of appointment on 8/15.  [name], RN

## 2023-01-13 ENCOUNTER — Other Ambulatory Visit (HOSPITAL_COMMUNITY): Payer: Self-pay

## 2023-01-19 ENCOUNTER — Ambulatory Visit: Payer: No Typology Code available for payment source | Admitting: Internal Medicine

## 2023-01-19 ENCOUNTER — Other Ambulatory Visit: Payer: Self-pay

## 2023-01-19 ENCOUNTER — Encounter: Payer: Self-pay | Admitting: Internal Medicine

## 2023-01-19 ENCOUNTER — Other Ambulatory Visit (HOSPITAL_COMMUNITY): Payer: Self-pay

## 2023-01-19 VITALS — Wt 115.0 lb

## 2023-01-19 DIAGNOSIS — A630 Anogenital (venereal) warts: Secondary | ICD-10-CM

## 2023-01-19 DIAGNOSIS — F5101 Primary insomnia: Secondary | ICD-10-CM

## 2023-01-19 DIAGNOSIS — R630 Anorexia: Secondary | ICD-10-CM

## 2023-01-19 DIAGNOSIS — B20 Human immunodeficiency virus [HIV] disease: Secondary | ICD-10-CM

## 2023-01-19 MED ORDER — BICTEGRAVIR-EMTRICITAB-TENOFOV 50-200-25 MG PO TABS
1.0000 | ORAL_TABLET | Freq: Every day | ORAL | 11 refills | Status: DC
  Filled 2023-01-19 – 2023-01-23 (×2): qty 30, 30d supply, fill #0
  Filled 2023-02-09 – 2023-03-13 (×2): qty 30, 30d supply, fill #1
  Filled 2023-03-29: qty 30, 30d supply, fill #2

## 2023-01-19 MED ORDER — MIRTAZAPINE 15 MG PO TABS
15.0000 mg | ORAL_TABLET | Freq: Every day | ORAL | 3 refills | Status: DC
  Filled 2023-01-19: qty 30, 30d supply, fill #0
  Filled 2023-02-09 – 2023-03-13 (×2): qty 30, 30d supply, fill #1

## 2023-01-19 MED ORDER — DORAVIRINE 100 MG PO TABS
100.0000 mg | ORAL_TABLET | Freq: Every day | ORAL | 11 refills | Status: DC
  Filled 2023-01-19 – 2023-01-23 (×2): qty 30, 30d supply, fill #0
  Filled 2023-02-09: qty 30, 30d supply, fill #1

## 2023-01-19 MED ORDER — ZOLPIDEM TARTRATE 5 MG PO TABS
5.0000 mg | ORAL_TABLET | Freq: Every evening | ORAL | 1 refills | Status: DC | PRN
  Filled 2023-01-19: qty 30, 30d supply, fill #0
  Filled 2023-02-09 – 2023-03-20 (×3): qty 30, 30d supply, fill #1

## 2023-01-19 MED ORDER — SULFAMETHOXAZOLE-TRIMETHOPRIM 800-160 MG PO TABS
1.0000 | ORAL_TABLET | ORAL | 1 refills | Status: DC
  Filled 2023-01-19: qty 12, 28d supply, fill #0
  Filled 2023-02-09 – 2023-03-13 (×2): qty 12, 28d supply, fill #1

## 2023-01-19 NOTE — Progress Notes (Signed)
RFV: follow up for hiv disease  Patient ID: Cory Russell, male   DOB: 21-Nov-1990, 32 y.o.   MRN: [id number]  HPI Ashraf is a 32yo M with hiv disease, CD 4 count of 144 (9%) and VL of 241K,, currently on genvoya-DRV at and at times has been taking his meds every other day for 6 month due to difficulty his meds due to pill size. 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 Dr. Maisie Fus. He was seen in clinic at end of July where there was concern for multidrug resistance. He had genotype done which showed multiple mutations--   Antiretroviral drugs      Resistance  Mutations Detected                            Predicted  ___________________________________________________________                                 !   !        NRTIs                     !   !  ZDV (zidovudine or Retrovir)    ! NO!     ABC (abacavir or Ziagen)        ! NO!     ddI (didanosine or Videx)       ! NO!     3TC (lamivudine or Epivir)      ! NO!     FTC (emtricitabine or Emtriva)  ! NO!     d4T (stavudine or Zerit)        ! NO!     TDF (tenofovir or Viread)       ! NO!     ________________________________!___!______________________                                 !   !        NNRTIs                    !   !  ETR (etravirine or Intelence)   !YES!H08M,V784O  EFV (efavirenz or Sustiva)      !PRB!Y181C  NVP (nevirapine or Viramune)    !YES!Y181C  RPV (rilpivirine or Edurant)    !PRB!Y181C  DOR (doravirine or Pifeltro)    ! NO!     ________________________________!___!______________________                                 !   !        PIs                       !   !  FPV (fos-amprenavir or Lexiva)  ! NO!     IDV (indinavir or Crixivan)     ! NO!     NFV (nelfinavir or Viracept)    ! NO!     SQV (saquinavir or Invirase)    ! NO!     LPV (lopinavir or Kaletra)      ! NO!     ATV (atazanavir or Reyataz)     !  NO!     TPV  (tipranavir or Aptivus)     ! NO!     DRV (darunavir or Prezista)     ! NO!                                     !   !  ________________________________!___!______________________  PRB = PROBABLE OR EMERGING RESISTANCE  OTHER MUTATIONS DETECTED:  RT GENE MUTATIONS: V179E  PR GENE MUTATIONS: L10I/L,E35D,I62V,L63P,I64L,A71V,V77I,I93L  S- doravarine,  R- elvitegravir, other Integrase inhibitors still susceptible  Outpatient Encounter Medications as of 01/19/2023  Medication Sig   darunavir (PREZISTA) 800 MG tablet Take 1 tablet (800 mg total) by mouth daily.   elvitegravir-cobicistat-emtricitabine-tenofovir (GENVOYA) 150-150-200-10 MG TABS tablet Take 1 tablet by mouth daily.   megestrol (MEGACE) 400 MG/10ML suspension Take 10 mLs (400 mg total) by mouth daily.   ondansetron (ZOFRAN) 4 MG tablet Take 1 tablet (4 mg total) by mouth daily as needed for nausea or vomiting. Take 30 minutes prior to medication   No facility-administered encounter medications on file as of 01/19/2023.     Patient Active Problem List   Diagnosis Date Noted   Nausea 01/05/2023   HIV (human immunodeficiency virus infection) (HCC)    Hypertension    Rectal mass 09/30/2019   Syphilis 09/30/2019   Hemorrhoids 01/23/2019   Routine screening for STI (sexually transmitted infection) 11/27/2018   Healthcare maintenance 06/25/2018   Depression 01/26/2015   Encounter for long-term (current) use of medications 08/28/2014   Poor appetite 08/28/2014   Human immunodeficiency virus (HIV) disease (HCC) 12/20/2013     Health Maintenance Due  Topic Date Due   DTaP/Tdap/Td (7 - Td or Tdap) 11/20/2017   COVID-19 Vaccine (4 - 2023-24 season) 02/04/2022   INFLUENZA VACCINE  01/05/2023     Review of Systems +anal bleeding occasionally. No recent sexual activity Physical Exam   Wt 115 lb (52.2 kg)   BMI 18.01 kg/m   Physical Exam  Constitutional: He is oriented to person, place, and time. He appears well-developed and  well-nourished. No distress.  HENT:  Mouth/Throat: Oropharynx is clear and moist. No oropharyngeal exudate.  Cardiovascular: Normal rate, regular rhythm and normal heart sounds. Exam reveals no gallop and no friction rub.  No murmur heard.  Pulmonary/Chest: Effort normal and breath sounds normal. No respiratory distress. He has no wheezes.  Abdominal: Soft. Bowel sounds are normal. He exhibits no distension. There is no tenderness.  Lymphadenopathy:  He has no cervical adenopathy.  Neurological: He is alert and oriented to person, place, and time.  Skin: Skin is warm and dry. No rash noted. No erythema.  Psychiatric: He has a normal mood and affect. His behavior is normal.   Lab Results  Component Value Date   CD4TCELL 9 (L) 01/04/2023   Lab Results  Component Value Date   CD4TABS 144 (L) 01/04/2023   CD4TABS 129 (L) 11/15/2022   CD4TABS 163 (L) 04/21/2022   Lab Results  Component Value Date   HIV1RNAQUANT 241,000 (H) 11/15/2022   Lab Results  Component Value Date   HEPBSAB POS (A) 12/02/2013   Lab Results  Component Value Date   LABRPR REACTIVE (A) 11/15/2022    CBC Lab Results  Component Value Date   WBC 5.0 11/15/2022   RBC 4.25 11/15/2022   HGB 12.3 (L) 11/15/2022   HCT 36.9 (L) 11/15/2022   PLT 278 11/15/2022  MCV 86.8 11/15/2022   MCH 28.9 11/15/2022   MCHC 33.3 11/15/2022   RDW 14.0 11/15/2022   LYMPHSABS 1,670 11/15/2022   MONOABS 444 03/08/2016   EOSABS 0 (L) 11/15/2022    BMET Lab Results  Component Value Date   NA 136 11/15/2022   K 3.8 11/15/2022   CL 102 11/15/2022   CO2 25 11/15/2022   GLUCOSE 103 (H) 11/15/2022   BUN 13 11/15/2022   CREATININE 0.85 11/15/2022   CALCIUM 9.7 11/15/2022   GFRNONAA 110 05/19/2020   GFRAA 127 05/19/2020      Assessment and Plan  New regimen = devise new regimen that is effective with decrease in size of pills. Also will try to do apple sauce. - Biktarvy-doravarine. May need to add sunluca injections  if not seeing improvements  Sleep = ambien prn  Nightsweat = quantiferon. Revisit if still having nightsweats after 1 month of hiv meds. Suspect that this is from uncontrolled hiv disease  Oi proph = will give bactrim DS three times per week  Rectal bleeding = hx of LASER ABLATION CONDOLAMATA   Depression = remeron 30mg  --also help with appetite; his BL weight is 120-130  I have personally spent 50 minutes involved in face-to-face and non-face-to-face activities for this patient on the day of the visit. Professional time spent includes the following activities: Preparing to see the patient (review of tests), Obtaining and/or reviewing separately obtained history (admission/discharge record), Performing a medically appropriate examination and/or evaluation , Ordering medications/tests/procedures, referring and communicating with other health care professionals, Documenting clinical information in the EMR, Independently interpreting results (not separately reported), Communicating results to the patient/family/caregiver, Counseling and educating the patient/family/caregiver and Care coordination (not separately reported).

## 2023-01-19 NOTE — Progress Notes (Signed)
Educated Cory Russell on new regimen of Biktarvy and doravirine. Encouraged him to take medications with food and to utilize applesauce if need be. Stressed the importance of adherence to make sure he is taking both tablets at the same time each day. He does want his medications mailed to him, so stressed that he needs to answer his phone to be able to set up delivery every month. Will follow-up with him in 2 weeks to make sure he has started the new regimen and is tolerating.   Lennie Muckle, PharmD PGY1 Pharmacy Resident 01/19/2023 4:28 PM

## 2023-01-20 ENCOUNTER — Other Ambulatory Visit: Payer: Self-pay

## 2023-01-21 ENCOUNTER — Other Ambulatory Visit (HOSPITAL_COMMUNITY): Payer: Self-pay

## 2023-01-23 ENCOUNTER — Telehealth: Payer: Self-pay

## 2023-01-23 ENCOUNTER — Other Ambulatory Visit (HOSPITAL_COMMUNITY): Payer: Self-pay

## 2023-01-23 NOTE — Telephone Encounter (Signed)
[facility] Patient Advocate Encounter   Was successful in obtaining a [employer] copay card for [employer].  This copay card will make the patients copay $0.00.  I have spoken with the patient.    The billing information is as follows and has been shared with [facility].  RxBin: [id number] PCN: [id number] Member ID:  [id number] Group ID: [id number]  Clearance Coots, CPhT Specialty Pharmacy Patient Advocate [facility] Phone: [phone] Fax:  [phone]

## 2023-01-31 NOTE — Progress Notes (Signed)
01/31/2023  HPI: Cory Russell is a 32 y.o. male who presents to the [facility] pharmacy clinic for HIV follow-up.  Patient Active Problem List   Diagnosis Date Noted   Nausea 01/05/2023   HIV (human immunodeficiency virus infection) (HCC)    Hypertension    Rectal mass 09/30/2019   Syphilis 09/30/2019   Hemorrhoids 01/23/2019   Routine screening for STI (sexually transmitted infection) 11/27/2018   Healthcare maintenance 06/25/2018   Depression 01/26/2015   Encounter for long-term (current) use of medications 08/28/2014   Poor appetite 08/28/2014   Human immunodeficiency virus (HIV) disease (HCC) 12/20/2013    Patient's Medications  New Prescriptions   No medications on file  Previous Medications   BICTEGRAVIR-EMTRICITABINE-TENOFOVIR AF (BIKTARVY) 50-200-25 MG TABS TABLET    Take 1 tablet by mouth daily.   DORAVIRINE (PIFELTRO) 100 MG TABS TABLET    Take 1 tablet (100 mg total) by mouth daily.   MEGESTROL (MEGACE) 400 MG/10ML SUSPENSION    Take 10 mLs (400 mg total) by mouth daily.   MIRTAZAPINE (REMERON) 15 MG TABLET    Take 1 tablet (15 mg total) by mouth at bedtime.   ONDANSETRON (ZOFRAN) 4 MG TABLET    Take 1 tablet (4 mg total) by mouth daily as needed for nausea or vomiting. Take 30 minutes prior to medication   SULFAMETHOXAZOLE-TRIMETHOPRIM (BACTRIM DS) 800-160 MG TABLET    Take 1 tablet by mouth 3 (three) times a week.   ZOLPIDEM (AMBIEN) 5 MG TABLET    Take 1 tablet (5 mg total) by mouth at bedtime as needed for sleep.  Modified Medications   No medications on file  Discontinued Medications   No medications on file    Labs: Lab Results  Component Value Date   HIV1RNAQUANT 241,000 (H) 11/15/2022   HIV1RNAQUANT 181,000 (H) 06/14/2022   HIV1RNAQUANT 124,000 (H) 04/21/2022   CD4TABS 144 (L) 01/04/2023   CD4TABS 129 (L) 11/15/2022   CD4TABS 163 (L) 04/21/2022    RPR and STI Lab Results  Component Value Date   LABRPR REACTIVE (A) 11/15/2022   LABRPR REACTIVE (A)  06/14/2022   LABRPR REACTIVE (A) 04/21/2022   LABRPR REACTIVE (A) 04/07/2021   LABRPR REACTIVE (A) 10/28/2020   RPRTITER 1:16 (H) 11/15/2022   RPRTITER 1:32 (H) 06/14/2022   RPRTITER 1:128 (H) 04/21/2022   RPRTITER 1:8 (H) 04/07/2021   RPRTITER 1:16 (H) 10/28/2020    STI Results GC GC CT CT  Latest Ref Rng & Units  NEGATIVE  NEGATIVE  11/15/2022 10:03 AM Negative   Negative    04/21/2022 11:05 AM Negative   Negative    04/07/2021  3:04 PM Negative    Negative    Negative   Negative    Negative    Negative    10/28/2020 11:53 AM Negative   Negative    07/06/2020 11:28 AM Negative    Negative   Negative    Negative    03/19/2020 11:36 AM Negative    Negative    Negative   Negative    Negative    Negative    09/30/2019 11:46 AM Positive    Negative   Negative    Negative    09/09/2019 10:37 AM Negative   Negative    11/27/2018 12:00 AM Negative   Negative    09/24/2018 12:00 AM Negative   Negative    05/07/2016 12:00 AM Negative   Negative    01/26/2016 12:00 AM Negative   Negative  08/28/2015 12:00 AM Negative   Negative    04/29/2015 12:00 AM Negative   Negative    01/12/2015 12:00 AM Negative   Negative    10/23/2014 12:00 AM Negative   Negative    08/08/2013  8:09 PM  NEGATIVE   NEGATIVE   08/08/2013 12:00 AM NG: Negative   CT: Negative      Hepatitis B Lab Results  Component Value Date   HEPBSAB POS (A) 12/02/2013   HEPBSAG NEGATIVE 12/02/2013   HEPBCAB NON REACTIVE 12/02/2013   Hepatitis C No results found for: "HEPCAB", "HCVRNAPCRQN" Hepatitis A Lab Results  Component Value Date   HAV REACTIVE (A) 12/02/2013   Lipids: Lab Results  Component Value Date   CHOL 149 04/21/2022   TRIG 69 04/21/2022   HDL 36 (L) 04/21/2022   CHOLHDL 4.1 04/21/2022   VLDL 17 01/26/2016   LDLCALC 97 04/21/2022    Current HIV Regimen: Biktarvy + Doravirine  Assessment: Cory Russell presents today for a 2 week HIV follow-up. He was initiated on Biktarvy + doravirine at  last visit but did not start taking them until 8/20 due to a delay in medication delivery. He hasn't missed any doses of his medications and he has been taking them with food. Overall, he has not been doing well since he was last seen. He reports that he got sick last week around Wednesday or Thursday and has had persistent chest congestion and soreness in his chest. He did have a fever with a Tmax of 102F but that has resolved. He has passed out twice in the last week. He passed out last Wednesday and tells me that he had a seizure. He hit his head during this episode and he called his mother after he woke up. He did not go to the emergency department after this episode but does deny any history of seizures prior to this. He passed out again on Saturday but denies any seizure activity at that time. He also endorses body aches that started 2-3 days ago. I suspect this is likely a cold and not IRIS. I told him that he needs to go to the emergency room or urgent care if he has any new headaches, SOB, fevers, blurry vision, or another seizure-like episode.   He also reports that he has white on both his tongue and the inside of his cheeks that started 3-4 days ago. He endorses pain with swallowing when speaking and feels as if his tongue is burning when he eats or drinks. I suspect that this is thrush. Will prescribe him fluconazole for 7 days.   He tells me that he feels very fatigued and zombie-like the day after he takes mirtazapine and zolpidem together. He tried taking 1/2 of the mirtazapine dose, but still felt the same. I encouraged him to try taking the mirtazapine without the zolpidem and monitor how that makes him feel.   Plan: - Gave him strict return precautions for respiratory illness and seizure-like activity - Start Diflucan 200 mg by mouth every day for 7 days - Continue Biktarvy and doravirine - Try taking mirtazapine without zolpidem - Follow-up with Dr. Drue Russell on 9/17 and 10/19  scheduled  Cory Russell, PharmD PGY1 Pharmacy Resident 01/31/2023 4:48 PM

## 2023-02-01 ENCOUNTER — Ambulatory Visit (INDEPENDENT_AMBULATORY_CARE_PROVIDER_SITE_OTHER): Payer: No Typology Code available for payment source | Admitting: Pharmacist

## 2023-02-01 ENCOUNTER — Other Ambulatory Visit: Payer: Self-pay

## 2023-02-01 DIAGNOSIS — B20 Human immunodeficiency virus [HIV] disease: Secondary | ICD-10-CM | POA: Diagnosis not present

## 2023-02-01 MED ORDER — FLUCONAZOLE 200 MG PO TABS: 200.0000 mg | ORAL_TABLET | Freq: Every day | ORAL | 0 refills | Status: AC

## 2023-02-09 ENCOUNTER — Other Ambulatory Visit (HOSPITAL_COMMUNITY): Payer: Self-pay

## 2023-02-09 ENCOUNTER — Other Ambulatory Visit (HOSPITAL_BASED_OUTPATIENT_CLINIC_OR_DEPARTMENT_OTHER): Payer: Self-pay

## 2023-02-18 ENCOUNTER — Other Ambulatory Visit (HOSPITAL_COMMUNITY): Payer: Self-pay

## 2023-02-21 ENCOUNTER — Ambulatory Visit: Payer: No Typology Code available for payment source | Admitting: Internal Medicine

## 2023-02-21 ENCOUNTER — Encounter: Payer: Self-pay | Admitting: Internal Medicine

## 2023-02-21 ENCOUNTER — Other Ambulatory Visit: Payer: Self-pay

## 2023-02-21 VITALS — BP 116/80 | HR 56 | Temp 98.1°F | Ht 67.0 in | Wt 120.0 lb

## 2023-02-21 DIAGNOSIS — R630 Anorexia: Secondary | ICD-10-CM

## 2023-02-21 DIAGNOSIS — B20 Human immunodeficiency virus [HIV] disease: Secondary | ICD-10-CM

## 2023-02-21 DIAGNOSIS — R61 Generalized hyperhidrosis: Secondary | ICD-10-CM | POA: Diagnosis not present

## 2023-02-21 DIAGNOSIS — F5101 Primary insomnia: Secondary | ICD-10-CM

## 2023-02-21 DIAGNOSIS — Z23 Encounter for immunization: Secondary | ICD-10-CM | POA: Diagnosis not present

## 2023-02-21 NOTE — Progress Notes (Unsigned)
Patient ID: Cory Russell, male   DOB: Feb 27, 1991, 32 y.o.   MRN: [id number]  HPI 31yo M with hiv disease, recently back on treatment. On biktarvy and doravarine. Not missing doses. And also bactrim taking m-w-f.  Felt too drowzy  with ambien plus mirtazipine.  Also improved his appetite.  Some increase in weight since last visit #4 lb  ROS: + nightsweats no coughing  Outpatient Encounter Medications as of 02/21/2023  Medication Sig   bictegravir-emtricitabine-tenofovir AF (BIKTARVY) 50-200-25 MG TABS tablet Take 1 tablet by mouth daily.   doravirine (PIFELTRO) 100 MG TABS tablet Take 1 tablet (100 mg total) by mouth daily.   mirtazapine (REMERON) 15 MG tablet Take 1 tablet (15 mg total) by mouth at bedtime.   sulfamethoxazole-trimethoprim (BACTRIM DS) 800-160 MG tablet Take 1 tablet by mouth 3 (three) times a week.   zolpidem (AMBIEN) 5 MG tablet Take 1 tablet (5 mg total) by mouth at bedtime as needed for sleep.   megestrol (MEGACE) 400 MG/10ML suspension Take 10 mLs (400 mg total) by mouth daily. (Patient not taking: Reported on 02/21/2023)   ondansetron (ZOFRAN) 4 MG tablet Take 1 tablet (4 mg total) by mouth daily as needed for nausea or vomiting. Take 30 minutes prior to medication (Patient not taking: Reported on 02/21/2023)   No facility-administered encounter medications on file as of 02/21/2023.     Patient Active Problem List   Diagnosis Date Noted   Nausea 01/05/2023   HIV (human immunodeficiency virus infection) (HCC)    Hypertension    Rectal mass 09/30/2019   Syphilis 09/30/2019   Hemorrhoids 01/23/2019   Routine screening for STI (sexually transmitted infection) 11/27/2018   Healthcare maintenance 06/25/2018   Depression 01/26/2015   Encounter for long-term (current) use of medications 08/28/2014   Poor appetite 08/28/2014   Human immunodeficiency virus (HIV) disease (HCC) 12/20/2013     Health Maintenance Due  Topic Date Due   DTaP/Tdap/Td (7 - Td or  Tdap) 11/20/2017   INFLUENZA VACCINE  01/05/2023   COVID-19 Vaccine (4 - 2023-24 season) 02/05/2023     Review of Systems See above Physical Exam   BP 116/80   Pulse (!) 56   Temp 98.1 F (36.7 C) (Temporal)   Ht 5\' 7"  (1.702 m)   Wt 120 lb (54.4 kg)   SpO2 100%   BMI 18.79 kg/m   Physical Exam  Constitutional: He is oriented to person, place, and time. He appears well-developed and well-nourished. No distress.  HENT:  Mouth/Throat: Oropharynx is clear and moist. No oropharyngeal exudate. No thrush Cardiovascular: Normal rate, regular rhythm and normal heart sounds. Exam reveals no gallop and no friction rub.  No murmur heard.  Pulmonary/Chest: Effort normal and breath sounds normal. No respiratory distress. He has no wheezes.  Abdominal: Soft. Bowel sounds are normal. He exhibits no distension. There is no tenderness.  Lymphadenopathy:  He has no cervical adenopathy.  Neurological: He is alert and oriented to person, place, and time.  Skin: Skin is warm and dry. No rash noted. No erythema.  Psychiatric: He has a normal mood and affect. His behavior is normal.   Lab Results  Component Value Date   CD4TCELL 9 (L) 01/04/2023   Lab Results  Component Value Date   CD4TABS 144 (L) 01/04/2023   CD4TABS 129 (L) 11/15/2022   CD4TABS 163 (L) 04/21/2022   Lab Results  Component Value Date   HIV1RNAQUANT 241,000 (H) 11/15/2022   Lab Results  Component Value  Date   HEPBSAB POS (A) 12/02/2013   Lab Results  Component Value Date   LABRPR REACTIVE (A) 11/15/2022    CBC Lab Results  Component Value Date   WBC 5.0 11/15/2022   RBC 4.25 11/15/2022   HGB 12.3 (L) 11/15/2022   HCT 36.9 (L) 11/15/2022   PLT 278 11/15/2022   MCV 86.8 11/15/2022   MCH 28.9 11/15/2022   MCHC 33.3 11/15/2022   RDW 14.0 11/15/2022   LYMPHSABS 1,670 11/15/2022   MONOABS 444 03/08/2016   EOSABS 0 (L) 11/15/2022    BMET Lab Results  Component Value Date   NA 136 11/15/2022   K 3.8  11/15/2022   CL 102 11/15/2022   CO2 25 11/15/2022   GLUCOSE 103 (H) 11/15/2022   BUN 13 11/15/2022   CREATININE 0.85 11/15/2022   CALCIUM 9.7 11/15/2022   GFRNONAA 110 05/19/2020   GFRAA 127 05/19/2020      Assessment and Plan Thrush improved Insomnia = Hold ambien if not needed, but continue mirtazipine  Hiv disease = will check Labs today to see if getting close to being undetectable -currently on biktarvy and doravarine with applesauce Oi proph = continue on bactrim m-w-f  Appetite stimulation plus insomnia = continue on mirtazipine  Night sweats =QTF today  Health maintenance= Will take flu and covid vaccine today  Rtc in 4 wk

## 2023-02-22 ENCOUNTER — Telehealth: Payer: Self-pay

## 2023-02-22 NOTE — Telephone Encounter (Signed)
Detectable Viral Load Intervention (DVL)  Most recent VL:  HIV 1 RNA Quant  Date Value Ref Range Status  11/15/2022 241,000 (H) Copies/mL Final  06/14/2022 181,000 (H) Copies/mL Final  04/21/2022 124,000 (H) Copies/mL Final    Last Clinic Visit: 02/21/23  Current ART regimen: Biktarvy  Appointment status: patient does not have future appointment scheduled   Medication last dispensed (per chart review):   Medication Adherence   What pharmacy do you use for your ART?  [facility]  Do you pick up your medication at the pharmacy or is it mailed to you? delivered by pharmacy   How often do you miss a dose your ART? once a week  Are you experiencing any side effects with your ART?  Night sweats, sick on stomach  Are you having any trouble remembering what medication(s) you are supposed to take or how you are supposed to take them?  no  What helps you remember to take your medication(s)?  No    Barriers to Care   Lack of transportation to medical appointments?  No   2. Housing instability? No   3. If you are currently employed, are you having difficulty taking time off of work for medical appointments? no  4. Financial concerns (rent, utilities, etc.) yes; rent/ bills   5. Lack of consistent access to food? No   6. Trouble remembering and attending your appointments? No   7. Are you experiencing any other barriers that make it hard for you to come to appointments or take medication regularly? no   Interventions   Called patient to discuss medication adherence and possible barriers to care.   Juanita Laster, RMA

## 2023-02-27 ENCOUNTER — Telehealth: Payer: Self-pay | Admitting: Infectious Disease

## 2023-02-27 NOTE — Telephone Encounter (Signed)
Patient called tonight  He was admitted in [facility] to local ER with norrmal MRI of brain, normal EEG and normal LP with negative cryptococcoal antigen  He tells me that he was prescribed and given Keppra in the hospital but despite the Keppra had a seizure as recently as this morning but was discharged.  [place] now he is again having a feeling that he is going to have a seizure very soon he has not feeling of sense of impending seizure his HIV unfortunately is very poorly controlled with a viral load of nearly half 1 million and his CD4 count that is not back yet but when last checked was in the 100s and may be quite lower.  He would feel better if he could be thoroughly evaluated in our healthcare system and his [place] [facility] emergency department.  I think it would be prudent and wise and compassionate to have the patient admitted to [facility] to the hospitalist service and evaluated by neurology here in our healthcare system as well as our infectious disease team.  He denies history of significant alcohol consumption or having been on benzodiazepines and potentially having gone through withdrawal I cannot find a clear-cut reason why he would have a seizure at this point in time and thank that the safest thing is indeed for him to come and be admitted to our hospital for a full workup.  I have told him he can tell the ER physicians on-call to call me if needed but that I think it would be the best thing to have him admitted and further worked up.  I have reiterated the need to take his antiretrovirals and get his HIV under control something that he seems to have been struggling with for quite some time.  Also alert our triage staff and Dr. Drue Second who is on the inpatient service tomorrow and his primary ID provider

## 2023-02-28 ENCOUNTER — Observation Stay (HOSPITAL_COMMUNITY): Payer: No Typology Code available for payment source

## 2023-02-28 ENCOUNTER — Encounter (HOSPITAL_COMMUNITY): Payer: Self-pay

## 2023-02-28 ENCOUNTER — Other Ambulatory Visit: Payer: Self-pay

## 2023-02-28 ENCOUNTER — Inpatient Hospital Stay (HOSPITAL_COMMUNITY)
Admission: EM | Admit: 2023-02-28 | Discharge: 2023-03-03 | DRG: 101 | Disposition: A | Discharge status: Home / Self Care | Payer: No Typology Code available for payment source | Attending: Family Medicine | Admitting: Family Medicine

## 2023-02-28 DIAGNOSIS — Z8249 Family history of ischemic heart disease and other diseases of the circulatory system: Secondary | ICD-10-CM

## 2023-02-28 DIAGNOSIS — I10 Essential (primary) hypertension: Secondary | ICD-10-CM | POA: Diagnosis present

## 2023-02-28 DIAGNOSIS — F1410 Cocaine abuse, uncomplicated: Secondary | ICD-10-CM | POA: Diagnosis present

## 2023-02-28 DIAGNOSIS — Z811 Family history of alcohol abuse and dependence: Secondary | ICD-10-CM

## 2023-02-28 DIAGNOSIS — Z6372 Alcoholism and drug addiction in family: Secondary | ICD-10-CM

## 2023-02-28 DIAGNOSIS — Z820 Family history of epilepsy and other diseases of the nervous system: Secondary | ICD-10-CM

## 2023-02-28 DIAGNOSIS — A510 Primary genital syphilis: Secondary | ICD-10-CM | POA: Diagnosis present

## 2023-02-28 DIAGNOSIS — R569 Unspecified convulsions: Secondary | ICD-10-CM | POA: Diagnosis not present

## 2023-02-28 DIAGNOSIS — B20 Human immunodeficiency virus [HIV] disease: Secondary | ICD-10-CM

## 2023-02-28 DIAGNOSIS — Z8042 Family history of malignant neoplasm of prostate: Secondary | ICD-10-CM

## 2023-02-28 DIAGNOSIS — F32A Depression, unspecified: Secondary | ICD-10-CM | POA: Diagnosis present

## 2023-02-28 DIAGNOSIS — K219 Gastro-esophageal reflux disease without esophagitis: Secondary | ICD-10-CM | POA: Diagnosis present

## 2023-02-28 DIAGNOSIS — Z79899 Other long term (current) drug therapy: Secondary | ICD-10-CM

## 2023-02-28 DIAGNOSIS — R630 Anorexia: Secondary | ICD-10-CM | POA: Diagnosis not present

## 2023-02-28 DIAGNOSIS — Z833 Family history of diabetes mellitus: Secondary | ICD-10-CM

## 2023-02-28 LAB — BASIC METABOLIC PANEL
Anion gap: 7 (ref 5–15)
BUN: 10 mg/dL (ref 6–20)
CO2: 25 mmol/L (ref 22–32)
Calcium: 9 mg/dL (ref 8.9–10.3)
Chloride: 103 mmol/L (ref 98–111)
Creatinine, Ser: 1.05 mg/dL (ref 0.61–1.24)
GFR, Estimated: >60 mL/min (ref >60)
Glucose, Bld: 82 mg/dL (ref 70–99)
Potassium: 3.9 mmol/L (ref 3.5–5.1)
Sodium: 135 mmol/L (ref 135–145)

## 2023-02-28 LAB — CBC
HCT: 35.2 % — ABNORMAL LOW (ref 39.0–52.0)
Hemoglobin: 11.2 g/dL — ABNORMAL LOW (ref 13.0–17.0)
MCH: 27.6 pg (ref 26.0–34.0)
MCHC: 31.8 g/dL (ref 30.0–36.0)
MCV: 86.7 fL (ref 80.0–100.0)
Platelets: 306 10*3/uL (ref 150–400)
RBC: 4.06 MIL/uL — ABNORMAL LOW (ref 4.22–5.81)
RDW: 15.9 % — ABNORMAL HIGH (ref 11.5–15.5)
WBC: 4.4 10*3/uL (ref 4.0–10.5)
nRBC: 0 % (ref 0.0–0.2)

## 2023-02-28 MED ORDER — MIRTAZAPINE 15 MG PO TABS
15.0000 mg | ORAL_TABLET | Freq: Every day | ORAL | Status: DC
  Administered 2023-02-28 – 2023-03-02 (×3): 15 mg via ORAL
  Filled 2023-02-28 (×3): qty 1

## 2023-02-28 MED ORDER — DORAVIRINE 100 MG PO TABS
100.0000 mg | ORAL_TABLET | Freq: Every day | ORAL | Status: DC
  Administered 2023-02-28 – 2023-03-01 (×2): 100 mg via ORAL
  Filled 2023-02-28 (×4): qty 1

## 2023-02-28 MED ORDER — BICTEGRAVIR-EMTRICITAB-TENOFOV 50-200-25 MG PO TABS
1.0000 | ORAL_TABLET | Freq: Every day | ORAL | Status: DC
  Administered 2023-02-28 – 2023-03-03 (×4): 1 via ORAL
  Filled 2023-02-28 (×4): qty 1

## 2023-02-28 MED ORDER — ENOXAPARIN SODIUM 40 MG/0.4ML IJ SOSY
40.0000 mg | PREFILLED_SYRINGE | INTRAMUSCULAR | Status: DC
  Administered 2023-03-01: 40 mg via SUBCUTANEOUS
  Filled 2023-02-28: qty 0.4

## 2023-02-28 MED ORDER — ONDANSETRON HCL 4 MG PO TABS: 4.0000 mg | ORAL_TABLET | Freq: Four times a day (QID) | ORAL | Status: DC | PRN

## 2023-02-28 MED ORDER — ACETAMINOPHEN 650 MG RE SUPP: 650.0000 mg | Freq: Four times a day (QID) | RECTAL | Status: DC | PRN

## 2023-02-28 MED ORDER — SULFAMETHOXAZOLE-TRIMETHOPRIM 800-160 MG PO TABS
1.0000 | ORAL_TABLET | ORAL | Status: DC
  Administered 2023-03-01 – 2023-03-03 (×2): 1 via ORAL
  Filled 2023-02-28 (×3): qty 1

## 2023-02-28 MED ORDER — ONDANSETRON HCL 4 MG/2ML IJ SOLN: 4.0000 mg | Freq: Four times a day (QID) | INTRAMUSCULAR | Status: DC | PRN

## 2023-02-28 MED ORDER — LEVETIRACETAM 500 MG PO TABS
1000.0000 mg | ORAL_TABLET | Freq: Two times a day (BID) | ORAL | Status: DC
  Administered 2023-02-28: 1000 mg via ORAL
  Filled 2023-02-28: qty 2

## 2023-02-28 MED ORDER — ACETAMINOPHEN 325 MG PO TABS
650.0000 mg | ORAL_TABLET | Freq: Four times a day (QID) | ORAL | Status: DC | PRN
  Administered 2023-03-02: 650 mg via ORAL
  Filled 2023-02-28 (×2): qty 2

## 2023-02-28 MED ORDER — LEVETIRACETAM IN NACL 1000 MG/100ML IV SOLN
1000.0000 mg | Freq: Once | INTRAVENOUS | Status: AC
  Administered 2023-02-28: 1000 mg via INTRAVENOUS
  Filled 2023-02-28: qty 100

## 2023-02-28 MED ORDER — ZOLPIDEM TARTRATE 5 MG PO TABS
5.0000 mg | ORAL_TABLET | Freq: Every evening | ORAL | Status: DC | PRN
  Administered 2023-02-28 – 2023-03-02 (×3): 5 mg via ORAL
  Filled 2023-02-28 (×3): qty 1

## 2023-02-28 NOTE — ED Provider Notes (Signed)
EMERGENCY DEPARTMENT AT [facility] Provider Note   CSN: [id number] Arrival date & time: 02/28/23  8657     History  Chief Complaint  Patient presents with   Seizures    Cory Russell is a 32 y.o. 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  Reports he had 3-4 back-to-back seizures on 02/26/2023 that lasted about 3 to 5 minutes each.  He was sitting in the barber's chair when this occurred.  He was admitted to an outside hospital in [place] and had a workup including MRI of the brain, EEG, LP.  While there he also had another seizure.  The results were not discussed with him and he was discharged home on Keppra.  He reached out to Dr. Daiva Eves with infectious disease who recommends he be admitted for further workup by the ID and neurologist team.  Patient denies any changes in his medications, drug use, infections, stressors, that may have contributed to his seizures.  Seizures      Home Medications Prior to Admission medications   Medication Sig Start Date End Date Taking? Authorizing Provider  bictegravir-emtricitabine-tenofovir AF (BIKTARVY) 50-200-25 MG TABS tablet Take 1 tablet by mouth daily. 01/19/23   Judyann Munson, MD  doravirine (PIFELTRO) 100 MG TABS tablet Take 1 tablet (100 mg total) by mouth daily. 01/19/23   Judyann Munson, MD  megestrol (MEGACE) 400 MG/10ML suspension Take 10 mLs (400 mg total) by mouth daily. Patient not taking: Reported on 02/21/2023 01/04/23   Gardiner Barefoot, MD  mirtazapine (REMERON) 15 MG tablet Take 1 tablet (15 mg total) by mouth at bedtime. 01/19/23   Judyann Munson, MD  ondansetron (ZOFRAN) 4 MG tablet Take 1 tablet (4 mg total) by mouth daily as needed for nausea or vomiting. Take 30 minutes prior to medication Patient not taking: Reported on 02/21/2023 01/04/23   Gardiner Barefoot, MD  sulfamethoxazole-trimethoprim (BACTRIM DS) 800-160 MG tablet Take 1 tablet by mouth 3 (three)  times a week. 01/20/23   Judyann Munson, MD  zolpidem (AMBIEN) 5 MG tablet Take 1 tablet (5 mg total) by mouth at bedtime as needed for sleep. 01/19/23   Judyann Munson, MD      Allergies    Patient has no known allergies.    Review of Systems   Review of Systems  Neurological:  Positive for seizures.    Physical Exam Updated Vital Signs BP 114/87   Pulse 72   Temp 98.5 F (36.9 C) (Oral)   Resp 11   Ht 5\' 7"  (1.702 m)   Wt 56.7 kg   SpO2 100%   BMI 19.58 kg/m  Physical Exam Vitals and nursing note reviewed.  Constitutional:      Appearance: Normal appearance.  HENT:     Head: Atraumatic.  Cardiovascular:     Rate and Rhythm: Normal rate and regular rhythm.  Pulmonary:     Effort: Pulmonary effort is normal.  Neurological:     General: No focal deficit present.     Mental Status: He is alert.  Psychiatric:        Mood and Affect: Mood normal.        Behavior: Behavior normal.     ED Results / Procedures / Treatments   Labs (all labs ordered are listed, but only abnormal results are displayed) Labs Reviewed  CBC - Abnormal; Notable for the following components:      Result Value   RBC 4.06 (*)  Hemoglobin 11.2 (*)    HCT 35.2 (*)    RDW 15.9 (*)    All other components within normal limits  BASIC METABOLIC PANEL  T-HELPER CELLS (CD4) COUNT (NOT AT [facility])  URINALYSIS, ROUTINE W REFLEX MICROSCOPIC  RAPID URINE DRUG SCREEN, HOSP PERFORMED    EKG None  Radiology No results found.  Procedures Procedures    Medications Ordered in ED Medications  levETIRAcetam (KEPPRA) IVPB 1000 mg/100 mL premix (0 mg Intravenous Stopped 02/28/23 1441)    ED Course/ Medical Decision Making/ A&P                                 Medical Decision Making Amount and/or Complexity of Data Reviewed Labs: ordered.  Risk Prescription drug management.   32 y.o. male with pertinent past medical history of HIV presents to the ED for concern of new onset seizures     ED Course:  Patient presented to the ER due to new onset seizures that started 02/26/2023.  He was evaluated at outside hospital at [facility], and MRI brain, EEG, LP was obtained without any significant findings.  He reach out to his ID team Dr. Daiva Eves who recommended he be further worked up inpatient here at [facility].  I have reached out to both neurology and ID who will see patient inpatient as a consult for further workup of his seizures.  Of note, he has known HIV with last CD4 count at 144.  Denies any recent illnesses, medication changes, stressors that may have contributed to the seizure. Patient did not take his Keppra this morning.  Neurology Dr. Russella Dar recommended loading dose of 20 mg/kg Keppra be given.  This was given in the ER.   Impression: New onset seizure activity  Disposition:  Admission to hospitalist service Dr. Maryfrances Bunnell for further workup of seizures  Lab Tests: I Ordered, and personally interpreted labs.  The pertinent results include:   CBC without any leukocytosis BMP unremarkable    Consultations Obtained: I requested consultation with the neurologist Dr. Russella Dar,  and discussed lab and imaging findings as well as pertinent plan - they recommend: Admission for further workup in conjunction with ID I requested consultation with the ID Dr. Drue Second ,  and discussed lab and imaging findings as well as pertinent plan - they recommend: Admission for further workup for seizures I requested consultation with the hospitalist Dr. Maryfrances Bunnell,  and discussed lab and imaging findings as well as pertinent plan - they recommend: Admission for further workup in conjunction with ID and neurologist  External records from outside source obtained and reviewed including telephone encounter from yesterday with Dr. Daiva Eves, care everywhere records from hospitalization in [place] with LP results   Co morbidities that complicate the patient evaluation  HIV positive                Final Clinical Impression(s) / ED Diagnoses Final diagnoses:  Seizure-like activity [facility])    Rx / DC Orders ED Discharge Orders     None         Arabella Merles, PA-C 02/28/23 1531    Lonell Grandchild, MD 02/28/23 1724

## 2023-02-28 NOTE — ED Notes (Signed)
Patient transported to x-ray. ?

## 2023-02-28 NOTE — Assessment & Plan Note (Signed)
Depression - Continue mirtazapine

## 2023-02-28 NOTE — Hospital Course (Addendum)
Cory Russell is a 32 y.o. M with HIV/AIDS recently back on ART, recent seizure at [facility] who presented with recurrent seizure.  Just admitted for 3 days (discharged yesterday) from [facility] hospital for three brief witnessed GTC seizures in a row.  MRI w/wo contrast unremarkable.  EEG normal.  LP was acellular, CSF culture NG and crypto antigen (of CSF and blood) negative.  UDS cocaine and THC positive.  Only Tele-neuro available, recommended Keppra BID and no driving.    Reported on the morning of discharge, woke with all his tele leads off, his IV out and feeling post-ictal again, but was discharged anyway.  Has felt off since then.  Patient called his ID office overnight reporting above, recommended to come to our ER.

## 2023-02-28 NOTE — H&P (Signed)
History and Physical    Patient: Cory Russell ZOX:[id number] DOB: [birth year] DOA: 02/28/2023 DOS: the patient was seen and examined on 02/28/2023 PCP: Monna Fam, MD  Patient coming from: Home  Chief Complaint:  Chief Complaint  Patient presents with   Seizures       HPI:  [name] is a 32 y.o. M with HIV/AIDS recently back on ART, recent seizure at [facility] who presented with recurrent seizure.  Just admitted for 3 days (discharged yesterday) from [facility] hospital for three brief witnessed GTC seizures in a row.  MRI w/wo contrast unremarkable.  EEG normal.  LP was acellular, CSF culture NG and crypto antigen (of CSF and blood) negative.  UDS cocaine and THC positive.  Only Tele-neuro available, recommended Keppra BID and no driving.    Reported on the morning of discharge, woke with all his tele leads off, his IV out and feeling post-ictal again, but was discharged anyway.  Has felt off since then.  Patient called his ID office overnight reporting above, recommended to come to our ER.      Review of Systems  Constitutional:  Negative for chills, diaphoresis, fever, malaise/fatigue and weight loss.  HENT:  Negative for sore throat.   Eyes:  Positive for blurred vision. Negative for double vision and photophobia.  Respiratory:  Positive for shortness of breath. Negative for cough, hemoptysis, sputum production and wheezing.   Cardiovascular:  Negative for chest pain, orthopnea and leg swelling.  Gastrointestinal:  Negative for abdominal pain, blood in stool, constipation, diarrhea, heartburn, melena, nausea and vomiting.  Genitourinary:  Negative for dysuria, flank pain, frequency, hematuria and urgency.  Skin:  Negative for rash.  Neurological:  Positive for dizziness, seizures and headaches. Negative for tingling, tremors, sensory change, speech change, focal weakness, loss of consciousness and weakness.  All other systems reviewed and are  negative.    Past Medical History:  Diagnosis Date   Anal pain    w/ anal mass   Cocaine abuse (HCC)    Depression    GERD (gastroesophageal reflux disease)    HIV (human immunodeficiency virus infection) (HCC)    followed by ID-- dr comer   Hypertension    no meds , followed by dr comer   Past Surgical History:  Procedure Laterality Date   LASER ABLATION CONDOLAMATA N/A 12/23/2021   Procedure: LASER ABLATION CONDOLAMATA;  Surgeon: Romie Levee, MD;  Location: [facility];  Service: General;  Laterality: N/A;   MASS EXCISION N/A 06/30/2021   Procedure: EXCISION OF ANAL CONDYLOMA , ABLATION OF ANAL CONDYLOMA;  Surgeon: Romie Levee, MD;  Location: [facility];  Service: General;  Laterality: N/A;   Social History:  reports that he has never smoked. He has never used smokeless tobacco. He reports that he does not currently use alcohol. He reports current drug use. Drugs: Marijuana and Cocaine.  No Known Allergies  Family History  Problem Relation Age of Onset   Diabetes Mother    Hypertension Father    Prostate cancer Father    Alcoholism Brother    Hypertension Brother     Prior to Admission medications   Medication Sig Start Date End Date Taking? Authorizing Provider  bictegravir-emtricitabine-tenofovir AF (BIKTARVY) 50-200-25 MG TABS tablet Take 1 tablet by mouth daily. 01/19/23   Judyann Munson, MD  doravirine (PIFELTRO) 100 MG TABS tablet Take 1 tablet (100 mg total) by mouth daily. 01/19/23   Judyann Munson, MD  megestrol (MEGACE) 400 MG/10ML  suspension Take 10 mLs (400 mg total) by mouth daily. Patient not taking: Reported on 02/21/2023 01/04/23   Gardiner Barefoot, MD  mirtazapine (REMERON) 15 MG tablet Take 1 tablet (15 mg total) by mouth at bedtime. 01/19/23   Judyann Munson, MD  ondansetron (ZOFRAN) 4 MG tablet Take 1 tablet (4 mg total) by mouth daily as needed for nausea or vomiting. Take 30 minutes prior to medication Patient not  taking: Reported on 02/21/2023 01/04/23   Gardiner Barefoot, MD  sulfamethoxazole-trimethoprim (BACTRIM DS) 800-160 MG tablet Take 1 tablet by mouth 3 (three) times a week. 01/20/23   Judyann Munson, MD  zolpidem (AMBIEN) 5 MG tablet Take 1 tablet (5 mg total) by mouth at bedtime as needed for sleep. 01/19/23   Judyann Munson, MD    Physical Exam: Vitals:   02/28/23 1345 02/28/23 1400 02/28/23 1415 02/28/23 1430  BP: 105/70 (!) 111/90 111/83 114/87  Pulse: 79 62 76 72  Resp: 17 19 16 11   Temp:      TempSrc:      SpO2: 98% 96% 100% 100%  Weight:      Height:       General appearance: Thin adult male, lying in bed, alert in no distress, responds appropriate to questions, eye tach, dress and hygiene appropriate  HEENT: Anicteric, conjunctival pink, lids and lashes normal without scleral injection or icterus,  visual tracking smooth, oropharynx moist without lesions, dentition good, lips unremarkable, normal auditory acuity  Cor: RRR, no murmurs, no peripheral edema, JVP normal, pulses bilateral equal and normal Resp: Normal respiratory rate and rhythm, lungs clear without rales or wheezes Abd: Abdomen soft no tenderness palpation around quadrants, no masses or organomegaly, no ascites or distention MSK: Symmetrical without gross deformities of the hands, large joints or legs  Skin: Cap refill normal, skin intact without rashes or lesions Neuro: Speech is fluent, naming is grossly intact, extraocular movements are intact, pupils are equal and reactive, recent and remote recall seems normal, muscle tone is normal and symmetric, face symmetric, speech fluent Psych: Attention span normal, concentration seems normal, affect appropriate         Data Reviewed: Patient metabolic panel shows normal electrolytes and renal function CBC shows mild microcytic anemia Outside records reviewed and summarized above    Assessment and Plan: * Seizure (HCC) Second time seizure (one seizure a year ago,  no medical work up at that time).  Then multiple seizures evaluated at OSH.  MRI brain with and without contrast that was unremarkable, EEG that was normal, and LP that was normal at outside facility.  To me he reports minimal Ambien use, does not mention illicit drugs, and reports no other new medications used.  Minimal family history of seizures (cousins).  Still feels "off" I am uncertain the extent to which his Biktarvy, Septra or mirtazapine affect seizure threshold.  I don't know if there are other complications of untreated HIV that can cause seizures.  No metabolic cause of seizures identified. - Repeat UDS - Obtain CXR and UA - Neurology consult     Poor appetite Depression - Continue mirtazapine  Human immunodeficiency virus (HIV) disease (HCC) - Continue Biktarvy, Pifeltro - Continue Bactrim         Advance Care Planning: Full code  Consults: Infectious disease and neurology  Family Communication: Mother at the bedside  Severity of Illness: The appropriate patient status for this patient is OBSERVATION. Observation status is judged to be reasonable and necessary in order to  provide the required intensity of service to ensure the patient's safety. The patient's presenting symptoms, physical exam findings, and initial radiographic and laboratory data in the context of their medical condition is felt to place them at decreased risk for further clinical deterioration. Furthermore, it is anticipated that the patient will be medically stable for discharge from the hospital within 2 midnights of admission.   Author: Alberteen Sam, MD 02/28/2023 3:50 PM  For on call review [email or web address].

## 2023-02-28 NOTE — Consult Note (Signed)
Neurology Consult Note (PA STUDENT)  Reason for Consult: new onset seizures Referring Physician: Patsi Sears MD  CC: New onset seizures  HPI:  [name] is a 32 year old male with a past medical history significant for HIV/AIDs with CD4 count of 144 and substance abuse that presented to the ED with new onset seizures. Patient notes that he had a seizure last year where he dropped to the floor with body shaking. He went to his PCP after but was not started on any ASDs.   Patient denied any issues since but on 02/26/23 was sitting in barber chair where he was witnessed to have multiple back to back seizures that lasted around 3-5 minutes. Patient unsure characteristics of seizure. Patient admitted to [facility] hospital for further workup. MRI brain, LP and EEG were unremarkable, but patient had another seizure in the hospital -states he was woken by nurse who found him half out of the bed, naked, with IV/EEG leads removed. Patient was discharged on Keppra 1000mg  BID. Upon further discussion with patient's ID provider, he was encouraged to return to ED to ensure patient not still having seizures.   LP performed on 02/25/2023: 1 WBC, protein 28, glucose 62  MRI w and wo contrast on 02/26/2023: no acute pathologies noted. EEG on 02/27/2023: Normal awake EEG   ROS: Per HPI: all other systems reviewed and are negative  Past Medical History:  Diagnosis Date   Anal pain    w/ anal mass   Cocaine abuse (HCC)    Depression    GERD (gastroesophageal reflux disease)    HIV (human immunodeficiency virus infection) (HCC)    followed by ID-- dr comer   Hypertension    no meds , followed by dr comer   Family History  Problem Relation Age of Onset   Diabetes Mother    Hypertension Father    Prostate cancer Father    Alcoholism Brother    Hypertension Brother    Social History   Socioeconomic History   Marital status: Single    Spouse name: Not on file   Number of children: 0   Years  of education: Not on file   Highest education level: Bachelor's degree (e.g., BA, AB, BS)  Occupational History   Not on file  Tobacco Use   Smoking status: Never   Smokeless tobacco: Never  Vaping Use   Vaping status: Not on file  Substance and Sexual Activity   Alcohol use: Not Currently   Drug use: Yes    Types: Marijuana, Cocaine    Comment: 7-102-23 last used cocaine " a while  ago" per pt, smokes marijuana daily   Sexual activity: Yes    Partners: Male  Other Topics Concern   Not on file  Social History Narrative   Not on file   Social Determinants of Health   Financial Resource Strain: Low Risk  (02/27/2023)   Received from [facility]   Overall Financial Resource Strain (CARDIA)    Difficulty of Paying Living Expenses: Not very hard  Food Insecurity: No Food Insecurity (02/27/2023)   Received [facility]   Hunger Vital Sign    Worried About Running Out of Food in the Last Year: Never true    Ran Out of Food in the Last Year: Never true  Transportation Needs: No Transportation Needs (02/27/2023)   Received from [facility]   PRAPARE - Transportation    Lack of Transportation (Medical): No    Lack  of Transportation (Non-Medical): No  Physical Activity: Not on file  Stress: Not on file  Social Connections: Not on file  Intimate Partner Violence: Not on file   Current Outpatient Medications  Medication Instructions   bictegravir-emtricitabine-tenofovir AF (BIKTARVY) 50-200-25 MG TABS tablet 1 tablet, Oral, Daily   megestrol (MEGACE) 400 mg, Oral, Daily   mirtazapine (REMERON) 15 mg, Oral, Daily at bedtime   ondansetron (ZOFRAN) 4 mg, Oral, Daily PRN, Take 30 minutes prior to medication   Pifeltro 100 mg, Oral, Daily   sulfamethoxazole-trimethoprim (BACTRIM DS) 800-160 MG tablet 1 tablet, Oral, 3 times weekly   zolpidem (AMBIEN) 5 mg, Oral, At bedtime PRN   No Known Allergies  Exam: Blood pressure 114/87, pulse 72, temperature 98.5 F (36.9 C),  temperature source Oral, resp. rate 11, height 5\' 7"  (1.702 m), weight 56.7 kg, SpO2 100%.  Physical Exam Gen: A&O x4, NAD HEENT: Atraumatic, normocephalic;mucous membranes moist; oropharynx clear, tongue without atrophy or fasciculations. Neck: Supple, trachea midline. Resp: CTAB, no w/r/r CV: RRR, no m/g/r; nml S1 and S2. 2+ symmetric peripheral pulses. Abd: soft/NT/ND; nabs x 4 quad Extrem: Nml bulk; no cyanosis, clubbing, or edema.  NEURO:  Mental Status: Patient is alert and oriented x4. Language:. Naming, repetition, and comprehension intact.  Cranial Nerves: PERRL. EOMI, visual fields full, facial sensation intact, hearing intact, tongue/uvula/soft palate midline. No evidence of tongue atrophy or fasciculations Motor: 5/5 strength in all extremities Tone: is normal and bulk is normal Sensation- Intact to light touch bilaterally Coordination: FTN intact. No limb apraxia. Gait- deferred  Pertinent Labs:  CSF analysis (02/25/23): protein 28, glucose 62 ESR 63 RPR - reactive (Patient treated for Syphilis 10/17/16 with Pen G Injection) UDS: + Cocaine, THC CD4 count: 144  CMP     Component Value Date/Time   NA 135 02/28/2023 0952   NA 138 05/19/2020 1145   K 3.9 02/28/2023 0952   CL 103 02/28/2023 0952   CO2 25 02/28/2023 0952   GLUCOSE 82 02/28/2023 0952   BUN 10 02/28/2023 0952   BUN 10 05/19/2020 1145   CREATININE 1.05 02/28/2023 0952   CREATININE 0.85 11/15/2022 0943   CALCIUM 9.0 02/28/2023 0952   PROT 8.1 11/15/2022 0943   PROT 7.4 05/19/2020 1145   ALBUMIN 4.5 05/19/2020 1145   AST 18 11/15/2022 0943   ALT 14 11/15/2022 0943   ALKPHOS 71 05/19/2020 1145   BILITOT 0.3 11/15/2022 0943   BILITOT 0.3 05/19/2020 1145   EGFR 118 11/15/2022 0943   GFRNONAA >60 02/28/2023 0952   GFRNONAA 76 11/27/2018 1519   CBC    Component Value Date/Time   WBC 4.4 02/28/2023 0952   RBC 4.06 (L) 02/28/2023 0952   HGB 11.2 (L) 02/28/2023 0952   HGB 12.6 (L) 05/19/2020 1145    HCT 35.2 (L) 02/28/2023 0952   HCT 37.8 05/19/2020 1145   PLT 306 02/28/2023 0952   PLT 249 05/19/2020 1145   MCV 86.7 02/28/2023 0952   MCV 88 05/19/2020 1145   MCH 27.6 02/28/2023 0952   MCHC 31.8 02/28/2023 0952   RDW 15.9 (H) 02/28/2023 0952   RDW 14.1 05/19/2020 1145   LYMPHSABS 1,670 11/15/2022 0943   MONOABS 444 03/08/2016 1601   EOSABS 0 (L) 11/15/2022 0943   BASOSABS 10 11/15/2022 0943    Imaging Reviewed:   MRI with and without contrast - 02/26/2023 -Performed at [facility], images unavailable to view but radiologist impression mentioned below.  IMPRESSION: No evidence of restricted diffusion to suggest  acute ischemia.  No pathologic enhancing mass or pattern is identified.   CT HEAD WO CONTRAST - 02/25/2023 IMPRESSION: No acute intracranial abnormality.   EEG awake or drowsy - 02/27/2023 IMPRESSION: This is a normal awake EEG. No focal slowing, epileptogenic discharges or  electrographic seizures were recorded. No lateralizing findings are seen.    Assessment:  Patient is a 32 year old male with a history of HIV/AIDS and polysubstance abuse (positive for cocaine, THC) presents with recurrent seizure activity. First seizure one year ago, followed by multiple seizures two days ago. While hospitalized, nurse noted possible seizure activity vs agitation and patient started on Keppra. Given recurrence and escalation of seizures, patient was advised by ID for further workup for seizure etiologies. MRI, LP, EEG negative.   Plan: -Change Keppra to Depakote d/t possible agitation side effects -Recommend LTM EEG  -Recommend Substance use support and counseling at discharge   -follow up with outpatient neurology for continued medication managment  Richard Miu PA-S 02/28/23   NEUROHOSPITALIST ADDENDUM Performed a face to face diagnostic evaluation.   I have reviewed the contents of history and physical exam as documented by PA/ARNP/Resident and agree with above  documentation.  I have discussed and formulated the above plan as documented. Edits to the note have been made as needed.  Impression/Key exam findings/Plan: admitted for seizures. Reports 4 seizures over the weekend. First seizure was a year ago. Gets an aura of "I feel like I am going to pass out" which goes on for hours and then he has poor recollection of what happens next. Mom at bedside reports that he passess out and whole body shakes. Lasts less than a minute and mouth and eyes open and then he is out for about 10 mins and then back to himself. No recent head injuries with LOC, no hx of strokes, no hx of ICH. 2 of his cousins have epilepsy. Does not drink alcohol. Was on keppra but kept having seizures. I think it is reasonable to consider alternative. Will load with Depakote 20mg /Kg IV once, followed by Depakote 500mg  BID.  With his hx of HIV, he did get an LP at OSH which appears to be benign.  Would benefit from outpatient follow up with neurology. I discussed driving restrictions with patient and mother. HE cannot drive. He has to be seizure free for 6 months before he can resume driving. Full seizure precautions listed below.  Erick Blinks, MD [facility] [phone]   If 7pm to 7am, please call on call as listed on AMION.  Seizure precautions: Per [facility] statutes, patients with seizures are not allowed to drive until they have been seizure-free for six months and cleared by a physician    Use caution when using heavy equipment or power tools. Avoid working on ladders or at heights. Take showers instead of baths. Ensure the water temperature is not too high on the home water heater. Do not go swimming alone. Do not lock yourself in a room alone (i.e. bathroom). When caring for infants or small children, sit down when holding, feeding, or changing them to minimize risk of injury to the child in the event you have a seizure. Maintain good sleep hygiene. Avoid  alcohol.    If patient has another seizure, call 911 and bring them back to the ED if: A.  The seizure lasts longer than 5 minutes.      B.  The patient doesn't wake shortly after the seizure or has new problems  such as difficulty seeing, speaking or moving following the seizure C.  The patient was injured during the seizure D.  The patient has a temperature over 102 F (39C) E.  The patient vomited during the seizure and now is having trouble breathing    During the Seizure   - First, ensure adequate ventilation and place patients on the floor on their left side  Loosen clothing around the neck and ensure the airway is patent. If the patient is clenching the teeth, do not force the mouth open with any object as this can cause severe damage - Remove all items from the surrounding that can be hazardous. The patient may be oblivious to what's happening and may not even know what he or she is doing. If the patient is confused and wandering, either gently guide him/her away and block access to outside areas - Reassure the individual and be comforting - Call 911. In most cases, the seizure ends before EMS arrives. However, there are cases when seizures may last over 3 to 5 minutes. Or the individual may have developed breathing difficulties or severe injuries. If a pregnant patient or a person with diabetes develops a seizure, it is prudent to call an ambulance. - Finally, if the patient does not regain full consciousness, then call EMS. Most patients will remain confused for about 45 to 90 minutes after a seizure, so you must use judgment in calling for help. - Avoid restraints but make sure the patient is in a bed with padded side rails - Place the individual in a lateral position with the neck slightly flexed; this will help the saliva drain from the mouth and prevent the tongue from falling backward - Remove all nearby furniture and other hazards from the area - Provide verbal assurance as the  individual is regaining consciousness - Provide the patient with privacy if possible - Call for help and start treatment as ordered by the caregiver    After the Seizure (Postictal Stage)   After a seizure, most patients experience confusion, fatigue, muscle pain and/or a headache. Thus, one should permit the individual to sleep. For the next few days, reassurance is essential. Being calm and helping reorient the person is also of importance.   Most seizures are painless and end spontaneously. Seizures are not harmful to others but can lead to complications such as stress on the lungs, brain and the heart. Individuals with prior lung problems may develop labored breathing and respiratory distress.

## 2023-02-28 NOTE — Telephone Encounter (Signed)
Patient called to inform provider that he is currently at [facility] ED.  Understands that once he is admitted ID provider will be notified. Juanita Laster, RMA

## 2023-02-28 NOTE — Consult Note (Addendum)
[facility]   Reason for Consult:seizure in hiv patient    Referring Physician: ED  Active Problems:   * No active hospital problems. *    HPI: Cory Russell is a 32 y.o. male with poorly controlled, advanced hiv disease, hx of syphilis. CD 4 count of 144 (9%)/VL 429,000 ( sept 2024) on biktarvy-doravirine- recently getting back into care- new regimen as of 3-4 wks ago. He had remote history of one witnessed episode of seizure? Spell at home but not having been worked up or on AED.  He reports that over the weekend, while at barbershop had episode, loss of consciousness, and witnessed body spasm. Slow to come to awareness. X 2 episodes. He subsequently, went to local hospital to be evaluated for new onset seizure. While at outside hospital, he underwent LP -CSF analysis - wbc 1, rbc 9, protein 28, glucose 62. CSF CrAg negative.   Also had NCHCT and EEG was unrevealing. Despite starting on keppra 1000 mg BID, had another episode of seizure while sleeping on Monday morning. He was concern about the care receiving, thus he left to be evaluated here.  No fever, no chills. Does has headache since having LP. No nuchal rigidity  Past Medical History:  Diagnosis Date   Anal pain    w/ anal mass   Cocaine abuse (HCC)    Depression    GERD (gastroesophageal reflux disease)    HIV (human immunodeficiency virus infection) (HCC)    followed by ID-- dr comer   Hypertension    no meds , followed by dr comer    Allergies: No Known Allergies  MEDICATIONS:   Social History   Tobacco Use   Smoking status: Never   Smokeless tobacco: Never  Substance Use Topics   Alcohol use: Not Currently   Drug use: Yes    Types: Marijuana, Cocaine    Comment: 7-102-23 last used cocaine " a while  ago" per pt, smokes marijuana daily    Family History  Problem Relation Age of Onset   Diabetes Mother    Hypertension Father    Prostate cancer Father    Alcoholism Brother     Hypertension Brother     Review of Systems -  12 point ros is negative except what is mentioned above  OBJECTIVE: Temp:  [98.5 F (36.9 C)-98.6 F (37 C)] 98.5 F (36.9 C) (09/24 1229) Pulse Rate:  [62-147] 72 (09/24 1430) Resp:  [11-23] 11 (09/24 1430) BP: (105-123)/(65-90) 114/87 (09/24 1430) SpO2:  [93 %-100 %] 100 % (09/24 1430) Weight:  [56.7 kg] 56.7 kg (09/24 0945) Physical Exam  Constitutional: He is oriented to person, place, and time. He appears well-developed and well-nourished. No distress.  HENT:  Mouth/Throat: Oropharynx is clear and moist. No oropharyngeal exudate.  Cardiovascular: Normal rate, regular rhythm and normal heart sounds. Exam reveals no gallop and no friction rub.  No murmur heard.  Pulmonary/Chest: Effort normal and breath sounds normal. No respiratory distress. He has no wheezes.  Abdominal: Soft. Bowel sounds are normal. He exhibits no distension. There is no tenderness.  Lymphadenopathy:  He has no cervical adenopathy.  Neurological: He is alert and oriented to person, place, and time.  Skin: Skin is warm and dry. No rash noted. No erythema.  Psychiatric: He has a normal mood and affect. His behavior is normal.    LABS: Results for orders placed or performed during the hospital encounter of 02/28/23 (from the past 48 hour(s))  Basic metabolic panel - if new onset seizures     Status: None   Collection Time: 02/28/23  9:52 AM  Result Value Ref Range   Sodium 135 135 - 145 mmol/L   Potassium 3.9 3.5 - 5.1 mmol/L   Chloride 103 98 - 111 mmol/L   CO2 25 22 - 32 mmol/L   Glucose, Bld 82 70 - 99 mg/dL    Comment: Glucose reference range applies only to samples taken after fasting for at least 8 hours.   BUN 10 6 - 20 mg/dL   Creatinine, Ser 1.61 0.61 - 1.24 mg/dL   Calcium 9.0 8.9 - 09.6 mg/dL   GFR, Estimated >04 >54 mL/min    Comment: (NOTE) Calculated using the CKD-EPI Creatinine Equation (2021)    Anion gap 7 5 - 15    Comment:  Performed at [facility], [address] [place], [place] [address]  CBC - if new onset seizures     Status: Abnormal   Collection Time: 02/28/23  9:52 AM  Result Value Ref Range   WBC 4.4 4.0 - 10.5 K/uL   RBC 4.06 (L) 4.22 - 5.81 MIL/uL   Hemoglobin 11.2 (L) 13.0 - 17.0 g/dL   HCT 91.4 (L) 78.2 - 95.6 %   MCV 86.7 80.0 - 100.0 fL   MCH 27.6 26.0 - 34.0 pg   MCHC 31.8 30.0 - 36.0 g/dL   RDW 21.3 (H) 08.6 - 57.8 %   Platelets 306 150 - 400 K/uL   nRBC 0.0 0.0 - 0.2 %    Comment: Performed at [facility], [address], [place], [place] [address]    MICRO: reviewed IMAGING: No results found.   Assessment/Plan:   - restart on keppra 1000 mg. Bid - please have neurology consult to see if need to titrate further up. - would like to have him 2-3 days hospitalized to see if seizures are managed and no further episodes  From hiv standpoint = will continue on biktarvy and doravirine to see if truly adherent. May need to change regimen given his viral load not improved. Has difficuty swallowing large pills. Prefers to take meds with apple sauce  Oi proph = continue with bactrim ds three times per week  Hx of syphilis = will check with outside hospital if they did CSF VDRL

## 2023-02-28 NOTE — Assessment & Plan Note (Signed)
-   Continue Biktarvy, Pifeltro - Continue Bactrim

## 2023-02-28 NOTE — ED Triage Notes (Signed)
Pt was admitted over the weekend at a hospital in [place]. Pt denies hx of seizures. Pt was discharged yesterday after a negative workup. Pt called his Infectious Disease doctor and was advised to come here to be readmitted. Pt was discharged with keppra.

## 2023-02-28 NOTE — Assessment & Plan Note (Signed)
Second time seizure (one seizure a year ago, no medical work up at that time).  Then multiple seizures evaluated at OSH.  MRI brain with and without contrast that was unremarkable, EEG that was normal, and LP that was normal at outside facility.  To me he reports minimal Ambien use, does not mention illicit drugs, and reports no other new medications used.  Minimal family history of seizures (cousins).  Still feels "off" I am uncertain the extent to which his Biktarvy, Septra or mirtazapine affect seizure threshold.  I don't know if there are other complications of untreated HIV that can cause seizures.  No metabolic cause of seizures identified. - Repeat UDS - Obtain CXR and UA - Neurology consult

## 2023-03-01 ENCOUNTER — Other Ambulatory Visit: Payer: Self-pay | Admitting: Infectious Disease

## 2023-03-01 DIAGNOSIS — R630 Anorexia: Secondary | ICD-10-CM | POA: Diagnosis present

## 2023-03-01 DIAGNOSIS — R569 Unspecified convulsions: Secondary | ICD-10-CM | POA: Diagnosis present

## 2023-03-01 DIAGNOSIS — A539 Syphilis, unspecified: Secondary | ICD-10-CM

## 2023-03-01 DIAGNOSIS — A510 Primary genital syphilis: Secondary | ICD-10-CM | POA: Diagnosis present

## 2023-03-01 DIAGNOSIS — Z79899 Other long term (current) drug therapy: Secondary | ICD-10-CM | POA: Diagnosis not present

## 2023-03-01 DIAGNOSIS — Z811 Family history of alcohol abuse and dependence: Secondary | ICD-10-CM | POA: Diagnosis not present

## 2023-03-01 DIAGNOSIS — Z8249 Family history of ischemic heart disease and other diseases of the circulatory system: Secondary | ICD-10-CM | POA: Diagnosis not present

## 2023-03-01 DIAGNOSIS — Z8042 Family history of malignant neoplasm of prostate: Secondary | ICD-10-CM | POA: Diagnosis not present

## 2023-03-01 DIAGNOSIS — K219 Gastro-esophageal reflux disease without esophagitis: Secondary | ICD-10-CM | POA: Diagnosis present

## 2023-03-01 DIAGNOSIS — Z833 Family history of diabetes mellitus: Secondary | ICD-10-CM | POA: Diagnosis not present

## 2023-03-01 DIAGNOSIS — B20 Human immunodeficiency virus [HIV] disease: Secondary | ICD-10-CM | POA: Diagnosis present

## 2023-03-01 DIAGNOSIS — Z6372 Alcoholism and drug addiction in family: Secondary | ICD-10-CM | POA: Diagnosis not present

## 2023-03-01 DIAGNOSIS — Z820 Family history of epilepsy and other diseases of the nervous system: Secondary | ICD-10-CM | POA: Diagnosis not present

## 2023-03-01 DIAGNOSIS — I10 Essential (primary) hypertension: Secondary | ICD-10-CM | POA: Diagnosis present

## 2023-03-01 DIAGNOSIS — F1410 Cocaine abuse, uncomplicated: Secondary | ICD-10-CM | POA: Diagnosis present

## 2023-03-01 DIAGNOSIS — F32A Depression, unspecified: Secondary | ICD-10-CM | POA: Diagnosis present

## 2023-03-01 LAB — URINALYSIS, ROUTINE W REFLEX MICROSCOPIC
Bilirubin Urine: NEGATIVE
Glucose, UA: NEGATIVE mg/dL
Hgb urine dipstick: NEGATIVE
Ketones, ur: NEGATIVE mg/dL
Leukocytes,Ua: NEGATIVE
Nitrite: NEGATIVE
Protein, ur: NEGATIVE mg/dL
Specific Gravity, Urine: 1.012 (ref 1.005–1.030)
pH: 6 (ref 5.0–8.0)

## 2023-03-01 LAB — RAPID URINE DRUG SCREEN, HOSP PERFORMED
Amphetamines: NOT DETECTED
Barbiturates: POSITIVE — AB
Benzodiazepines: NOT DETECTED
Cocaine: POSITIVE — AB
Opiates: NOT DETECTED
Tetrahydrocannabinol: POSITIVE — AB

## 2023-03-01 LAB — RPR TITER: RPR Titer: 1:256 {titer} — ABNORMAL HIGH

## 2023-03-01 LAB — QUANTIFERON-TB GOLD PLUS
Mitogen-NIL: 2.01 IU/mL
NIL: 0.02 IU/mL
QuantiFERON-TB Gold Plus: NEGATIVE
TB1-NIL: 0 IU/mL
TB2-NIL: 0 IU/mL

## 2023-03-01 LAB — HIV-1 RNA QUANT-NO REFLEX-BLD
HIV 1 RNA Quant: 429000 Copies/mL — ABNORMAL HIGH
HIV-1 RNA Quant, Log: 5.63 Log cps/mL — ABNORMAL HIGH

## 2023-03-01 LAB — T PALLIDUM AB: T Pallidum Abs: POSITIVE — AB

## 2023-03-01 LAB — RPR: RPR Ser Ql: REACTIVE — AB

## 2023-03-01 MED ORDER — NICOTINE 7 MG/24HR TD PT24: 7.0000 mg | MEDICATED_PATCH | TRANSDERMAL | 0 refills | Status: DC

## 2023-03-01 MED ORDER — VALPROATE SODIUM 100 MG/ML IV SOLN
1000.0000 mg | Freq: Once | INTRAVENOUS | Status: AC
  Administered 2023-03-01: 1000 mg via INTRAVENOUS
  Filled 2023-03-01: qty 10

## 2023-03-01 MED ORDER — NICOTINE 21 MG/24HR TD PT24: 21.0000 mg | MEDICATED_PATCH | TRANSDERMAL | 0 refills | Status: DC

## 2023-03-01 MED ORDER — DIVALPROEX SODIUM 250 MG PO DR TAB
500.0000 mg | DELAYED_RELEASE_TABLET | Freq: Two times a day (BID) | ORAL | Status: DC
  Administered 2023-03-01 – 2023-03-02 (×2): 500 mg via ORAL
  Filled 2023-03-01 (×2): qty 2

## 2023-03-01 MED ORDER — NICOTINE 14 MG/24HR TD PT24: 14.0000 mg | MEDICATED_PATCH | TRANSDERMAL | 0 refills | Status: DC

## 2023-03-01 MED ORDER — DARUNAVIR-COBICISTAT 800-150 MG PO TABS
1.0000 | ORAL_TABLET | Freq: Every day | ORAL | Status: DC
  Administered 2023-03-02 – 2023-03-03 (×2): 1 via ORAL
  Filled 2023-03-01 (×2): qty 1

## 2023-03-01 NOTE — Plan of Care (Signed)

## 2023-03-01 NOTE — Plan of Care (Signed)
Problem: Education: Goal: Knowledge of General Education information will improve Description: Including pain rating scale, medication(s)/side effects and non-pharmacologic comfort measures Outcome: Progressing   Problem: Clinical Measurements: Goal: Diagnostic test results will improve Outcome: Progressing   Problem: Activity: Goal: Risk for activity intolerance will decrease Outcome: Progressing

## 2023-03-01 NOTE — TOC CM/SW Note (Signed)
Transition of Care [facility]) - Inpatient Brief Assessment   Patient Details  Name: [name] MRN: [id number] Date of Birth: Aug 25, 1990  Transition of Care [facility]) CM/SW Contact:    Kermit Balo, RN Phone Number: 03/01/2023, 1:05 PM   Clinical Narrative:  No TOC needs at this time.   Transition of Care Asessment: Insurance and Status: Insurance coverage has been reviewed Patient has primary care physician: Yes Home environment has been reviewed: home with parent   Prior/Current Home Services: No current home services Social Determinants of Health Reivew: SDOH reviewed no interventions necessary Readmission risk has been reviewed: Yes Transition of care needs: no transition of care needs at this time

## 2023-03-01 NOTE — Progress Notes (Signed)
Neurology progress note  S: No further seizures. Patient is feeling well, no complaints.  Exam  Vitals:   03/01/23 1652 03/01/23 1926  BP: 111/66 113/71  Pulse: 83 77  Resp:  18  Temp: 98.3 F (36.8 C) 99 F (37.2 C)  SpO2: 99% 100%    Gen: patient lying in bed, NAD CV: extremities appear well-perfused Resp: normal WOB  Neurologic exam MS: alert, oriented x4, follows commands Speech: no dysarthria, no aphasia CN: PERRL, VFF, EOMI, sensation intact, face symmetric, hearing intact to voice Motor: 5/5 strength throughout Sensory: SILT Reflexes: 2+ symm with toes down bilat Coordination: FNF intact bilat Gait: deferred  Pertinent Labs:  CSF analysis (02/25/23): protein 28, glucose 62 ESR 63 RPR - reactive (Patient treated for Syphilis 10/17/16 with Pen G Injection) UDS: + Cocaine, THC CD4 count: 144   CMP     Labs (Brief)          Component Value Date/Time    NA 135 02/28/2023 0952    NA 138 05/19/2020 1145    K 3.9 02/28/2023 0952    CL 103 02/28/2023 0952    CO2 25 02/28/2023 0952    GLUCOSE 82 02/28/2023 0952    BUN 10 02/28/2023 0952    BUN 10 05/19/2020 1145    CREATININE 1.05 02/28/2023 0952    CREATININE 0.85 11/15/2022 0943    CALCIUM 9.0 02/28/2023 0952    PROT 8.1 11/15/2022 0943    PROT 7.4 05/19/2020 1145    ALBUMIN 4.5 05/19/2020 1145    AST 18 11/15/2022 0943    ALT 14 11/15/2022 0943    ALKPHOS 71 05/19/2020 1145    BILITOT 0.3 11/15/2022 0943    BILITOT 0.3 05/19/2020 1145    EGFR 118 11/15/2022 0943    GFRNONAA >60 02/28/2023 0952    GFRNONAA 76 11/27/2018 1519      CBC Labs (Brief)          Component Value Date/Time    WBC 4.4 02/28/2023 0952    RBC 4.06 (L) 02/28/2023 0952    HGB 11.2 (L) 02/28/2023 0952    HGB 12.6 (L) 05/19/2020 1145    HCT 35.2 (L) 02/28/2023 0952    HCT 37.8 05/19/2020 1145    PLT 306 02/28/2023 0952    PLT 249 05/19/2020 1145    MCV 86.7 02/28/2023 0952    MCV 88 05/19/2020 1145    MCH 27.6 02/28/2023  0952    MCHC 31.8 02/28/2023 0952    RDW 15.9 (H) 02/28/2023 0952    RDW 14.1 05/19/2020 1145    LYMPHSABS 1,670 11/15/2022 0943    MONOABS 444 03/08/2016 1601    EOSABS 0 (L) 11/15/2022 0943    BASOSABS 10 11/15/2022 0943        Imaging Reviewed:    MRI with and without contrast - 02/26/2023 -Performed at [facility], images unavailable to view but radiologist impression mentioned below.  IMPRESSION: No evidence of restricted diffusion to suggest acute ischemia.  No pathologic enhancing mass or pattern is identified.    CT HEAD WO CONTRAST - 02/25/2023 IMPRESSION: No acute intracranial abnormality.    EEG awake or drowsy - 02/27/2023 IMPRESSION: This is a normal awake EEG. No focal slowing, epileptogenic discharges or  electrographic seizures were recorded. No lateralizing findings are seen.   A/P: admitted for seizures. Reports 4 seizures over the weekend. First seizure was a year ago. Gets an aura of "I feel like I am going to pass out"  which goes on for hours and then he has poor recollection of what happens next. Mom at bedside reports that he passess out and whole body shakes. Lasts less than a minute and mouth and eyes open and then he is out for about 10 mins and then back to himself. No recent head injuries with LOC, no hx of strokes, no hx of ICH. 2 of his cousins have epilepsy. Does not drink alcohol. Was on keppra but kept having seizures he was switched to depakote 9/24 and has had no further events.   With his hx of HIV, he did get an LP at OSH which appears to be benign. ID contacted [place] today and were concerned that neurosyphilis is on the differential and requested we perform LP for VDRL.  We will hold DVT prophylaxis and perform this tomorrow.  - depakote 500mg  bid - LP tmrw for cell count and VDRL. Hold DVT prophylaxis prior to LP - Will continue to follow, patient will benefit from outpatient f/u with neurology after discharge - Seizure precautions below;  patient understands that he may not drive for 6 mos after last seizure  Bing Neighbors, MD [facility] [phone]  If 7pm- 7am, please page neurology on call as listed in AMION.   Seizure precautions: Per [facility] statutes, patients with seizures are not allowed to drive until they have been seizure-free for six months and cleared by a physician    Use caution when using heavy equipment or power tools. Avoid working on ladders or at heights. Take showers instead of baths. Ensure the water temperature is not too high on the home water heater. Do not go swimming alone. Do not lock yourself in a room alone (i.e. bathroom). When caring for infants or small children, sit down when holding, feeding, or changing them to minimize risk of injury to the child in the event you have a seizure. Maintain good sleep hygiene. Avoid alcohol.    If patient has another seizure, call 911 and bring them back to the ED if: A.  The seizure lasts longer than 5 minutes.      B.  The patient doesn't wake shortly after the seizure or has new problems such as difficulty seeing, speaking or moving following the seizure C.  The patient was injured during the seizure D.  The patient has a temperature over 102 F (39C) E.  The patient vomited during the seizure and now is having trouble breathing    During the Seizure   - First, ensure adequate ventilation and place patients on the floor on their left side  Loosen clothing around the neck and ensure the airway is patent. If the patient is clenching the teeth, do not force the mouth open with any object as this can cause severe damage - Remove all items from the surrounding that can be hazardous. The patient may be oblivious to what's happening and may not even know what he or she is doing. If the patient is confused and wandering, either gently guide him/her away and block access to outside areas - Reassure the individual and be comforting - Call  911. In most cases, the seizure ends before EMS arrives. However, there are cases when seizures may last over 3 to 5 minutes. Or the individual may have developed breathing difficulties or severe injuries. If a pregnant patient or a person with diabetes develops a seizure, it is prudent to call an ambulance. - Finally, if the patient does not regain full  consciousness, then call EMS. Most patients will remain confused for about 45 to 90 minutes after a seizure, so you must use judgment in calling for help. - Avoid restraints but make sure the patient is in a bed with padded side rails - Place the individual in a lateral position with the neck slightly flexed; this will help the saliva drain from the mouth and prevent the tongue from falling backward - Remove all nearby furniture and other hazards from the area - Provide verbal assurance as the individual is regaining consciousness - Provide the patient with privacy if possible - Call for help and start treatment as ordered by the caregiver    After the Seizure (Postictal Stage)   After a seizure, most patients experience confusion, fatigue, muscle pain and/or a headache. Thus, one should permit the individual to sleep. For the next few days, reassurance is essential. Being calm and helping reorient the person is also of importance.   Most seizures are painless and end spontaneously. Seizures are not harmful to others but can lead to complications such as stress on the lungs, brain and the heart. Individuals with prior lung problems may develop labored breathing and respiratory distress.

## 2023-03-01 NOTE — Progress Notes (Signed)
[facility]  PROGRESS NOTE  Cory Russell UYQ:[id number] DOB: Jun 23, 1990 DOA: 02/28/2023 PCP: Monna Fam, MD   Brief HPI:    32 y.o. M with HIV/AIDS recently back on ART, recent seizure at [facility] who presented with recurrent seizure.   Just admitted for 3 days (discharged yesterday) from [facility] hospital for three brief witnessed GTC seizures in a row.   MRI w/wo contrast unremarkable.  EEG normal.  LP was acellular, CSF culture NG and crypto antigen (of CSF and blood) negative.  UDS cocaine and THC positive.  Only Tele-neuro available, recommended Keppra BID and no driving.     Assessment/Plan:    Recurrent seizure -Patient had multiple seizures at outside hospital -MRI brain was unremarkable, EEG was normal, LP was normal at outside facility -Continue divalproex 500 mg p.o. every 12 hours -Neurology following  HIV -Continue Biktarvy, doravirine  History of syphilis -ID checking the outside hospital as needed CSF VDRL -Otherwise will need repeat LP to rule out neurosyphilis    Medications     bictegravir-emtricitabine-tenofovir AF  1 tablet Oral Daily   [START ON 03/02/2023] darunavir-cobicistat  1 tablet Oral Q breakfast   divalproex  500 mg Oral Q12H   mirtazapine  15 mg Oral QHS   sulfamethoxazole-trimethoprim  1 tablet Oral Once per day on Monday Wednesday Friday     Data Reviewed:   CBG:  No results for input(s): "GLUCAP" in the last 168 hours.  SpO2: 99 %    Vitals:   03/01/23 0300 03/01/23 0858 03/01/23 1139 03/01/23 1652  BP: 111/76 100/66 105/67 111/66  Pulse: 70 87 78 83  Resp: 17 18 17    Temp: 98.4 F (36.9 C) 98.1 F (36.7 C) 98.6 F (37 C) 98.3 F (36.8 C)  TempSrc: Oral Oral Oral Oral  SpO2: 100% 100% 100% 99%  Weight:      Height:          Data Reviewed:  Basic Metabolic Panel: Recent Labs  Lab 02/28/23 0952  NA 135  K 3.9  CL 103  CO2 25  GLUCOSE 82  BUN 10  CREATININE 1.05  CALCIUM 9.0     CBC: Recent Labs  Lab 02/28/23 0952  WBC 4.4  HGB 11.2*  HCT 35.2*  MCV 86.7  PLT 306    LFT No results for input(s): "AST", "ALT", "ALKPHOS", "BILITOT", "PROT", "ALBUMIN" in the last 168 hours.   Antibiotics: Anti-infectives (From admission, onward)    Start     Dose/Rate Route Frequency Ordered Stop   03/02/23 0800  darunavir-cobicistat (PREZCOBIX) 800-150 MG per tablet 1 tablet        1 tablet Oral Daily with breakfast 03/01/23 1045     03/01/23 1000  sulfamethoxazole-trimethoprim (BACTRIM DS) 800-160 MG per tablet 1 tablet        1 tablet Oral Once per day on Monday Wednesday Friday 02/28/23 1843     02/28/23 2000  bictegravir-emtricitabine-tenofovir AF (BIKTARVY) 50-200-25 MG per tablet 1 tablet        1 tablet Oral Daily 02/28/23 1843     02/28/23 2000  doravirine (PIFELTRO) tablet 100 mg  Status:  Discontinued        100 mg Oral Daily 02/28/23 1843 03/01/23 1048        DVT prophylaxis: Lovenox on hold for possible LP in a.m.  Code Status: Full code  Family Communication: No family at bedside   CONSULTS    Subjective   Denies any complaints  Objective    Physical Examination:   General-appears in no acute distress Heart-S1-S2, regular, no murmur auscultated Lungs-clear to auscultation bilaterally, no wheezing or crackles auscultated Abdomen-soft, nontender, no organomegaly Extremities-no edema in the lower extremities Neuro-alert, oriented x3, no focal deficit noted   Status is: Inpatient:             Meredeth Ide   [facility] If 7PM-7AM, please contact night-coverage at [email or web address], Office  [phone]   03/01/2023, 7:09 PM  LOS: 0 days

## 2023-03-01 NOTE — Progress Notes (Addendum)
    [facility]    Date of Admission:  02/28/2023   Total days of antibiotics 2   ID: Cory Russell is a 32 y.o. male with  hiv disease and new onset seizures Principal Problem:   Seizure (HCC) Active Problems:   Human immunodeficiency virus (HIV) disease (HCC)   Poor appetite    Subjective: Did not have seizures this morning.worried about the size of pills. Still some mild headache  RPR: 1:256  Medications:   bictegravir-emtricitabine-tenofovir AF  1 tablet Oral Daily   [START ON 03/02/2023] darunavir-cobicistat  1 tablet Oral Q breakfast   divalproex  500 mg Oral Q12H   mirtazapine  15 mg Oral QHS   sulfamethoxazole-trimethoprim  1 tablet Oral Once per day on Monday Wednesday Friday    Objective: Vital signs in last 24 hours: Temp:  [98 F (36.7 C)-99.1 F (37.3 C)] 98.6 F (37 C) (09/25 1139) Pulse Rate:  [70-87] 78 (09/25 1139) Resp:  [11-22] 17 (09/25 1139) BP: (93-117)/(63-89) 105/67 (09/25 1139) SpO2:  [100 %] 100 % (09/25 1139)  Physical Exam  Constitutional: He is oriented to person, place, and time. He appears well-developed and well-nourished. No distress.  HENT:  Mouth/Throat: Oropharynx is clear and moist. No oropharyngeal exudate.  Cardiovascular: Normal rate, regular rhythm and normal heart sounds. Exam reveals no gallop and no friction rub.  No murmur heard.  Pulmonary/Chest: Effort normal and breath sounds normal. No respiratory distress. He has no wheezes.  Abdominal: Soft. Bowel sounds are normal. He exhibits no distension. There is no tenderness.  Lymphadenopathy:  He has no cervical adenopathy.  Neurological: He is alert and oriented to person, place, and time.  Skin: Skin is warm and dry. No rash noted. No erythema.  Psychiatric: He has a normal mood and affect. His behavior is normal.    Lab Results Recent Labs    02/28/23 0952  WBC 4.4  HGB 11.2*  HCT 35.2*  NA 135  K 3.9  CL 103  CO2 25  BUN 10   CREATININE 1.05   Liver Panel No results for input(s): "PROT", "ALBUMIN", "AST", "ALT", "ALKPHOS", "BILITOT", "BILIDIR", "IBILI" in the last 72 hours. Sedimentation Rate No results for input(s): "ESRSEDRATE" in the last 72 hours. C-Reactive Protein No results for input(s): "CRP" in the last 72 hours.  Microbiology:  Studies/Results: DG Chest 2 View  Result Date: 02/28/2023 CLINICAL DATA:  Dyspnea. EXAM: CHEST - 2 VIEW COMPARISON:  None Available. FINDINGS: The heart size and mediastinal contours are within normal limits. Both lungs are clear. The visualized skeletal structures are unremarkable. IMPRESSION: No active cardiopulmonary disease. Electronically Signed   By: Kennith Center M.D.   On: 02/28/2023 17:31     Assessment/Plan: Hiv disease = poorly controlled. Concern that he may have element of treatment failure, despite genotype showing sensitive to biktarvy-doravirine. Will add prezcobix  daily starting tomorrow. It can be crushed so that will help  Reinfection with syphilis = given new onset seizure, would recommend to get LP, VDRL, and FTA-ABS send out on the csf in addition to cell count. Plan for tomorrow  New onset seizure = started on depakote iv load yesterday, thus far, not having new seizure. We wll see if can change to liquid formulation to continue to take for his new onset seizures.   Oi proph = continue with bactrim ds m-w-f  [facility] for Infectious Diseases Pager: [phone]  03/01/2023, 2:25 PM

## 2023-03-02 ENCOUNTER — Other Ambulatory Visit (HOSPITAL_COMMUNITY): Payer: Self-pay

## 2023-03-02 DIAGNOSIS — A539 Syphilis, unspecified: Secondary | ICD-10-CM | POA: Diagnosis not present

## 2023-03-02 DIAGNOSIS — R569 Unspecified convulsions: Secondary | ICD-10-CM | POA: Diagnosis not present

## 2023-03-02 DIAGNOSIS — B20 Human immunodeficiency virus [HIV] disease: Secondary | ICD-10-CM | POA: Diagnosis not present

## 2023-03-02 DIAGNOSIS — R630 Anorexia: Secondary | ICD-10-CM | POA: Diagnosis not present

## 2023-03-02 LAB — T-HELPER CELLS (CD4) COUNT (NOT AT ARMC)
CD4 % Helper T Cell: 9 % — ABNORMAL LOW (ref 33–65)
CD4 T Cell Abs: 112 /uL — ABNORMAL LOW (ref 400–1790)

## 2023-03-02 MED ORDER — ENOXAPARIN SODIUM 40 MG/0.4ML IJ SOSY
40.0000 mg | PREFILLED_SYRINGE | INTRAMUSCULAR | Status: DC
  Administered 2023-03-02: 40 mg via SUBCUTANEOUS
  Filled 2023-03-02: qty 0.4

## 2023-03-02 MED ORDER — VALPROIC ACID 250 MG/5ML PO SOLN: 500.0000 mg | Freq: Two times a day (BID) | ORAL | Status: DC

## 2023-03-02 MED ORDER — PENICILLIN G BENZATHINE 1200000 UNIT/2ML IM SUSY
2.4000 10*6.[IU] | PREFILLED_SYRINGE | Freq: Once | INTRAMUSCULAR | Status: AC
  Administered 2023-03-02: 2.4 10*6.[IU] via INTRAMUSCULAR
  Filled 2023-03-02: qty 4

## 2023-03-02 MED ORDER — VALPROIC ACID 250 MG/5ML PO SOLN
250.0000 mg | Freq: Four times a day (QID) | ORAL | Status: DC
  Administered 2023-03-02 – 2023-03-03 (×3): 250 mg via ORAL
  Filled 2023-03-02 (×3): qty 5

## 2023-03-02 MED ORDER — DARUNAVIR-COBICISTAT 800-150 MG PO TABS
1.0000 | ORAL_TABLET | Freq: Every day | ORAL | 0 refills | Status: DC
  Filled 2023-03-02: qty 30, 30d supply, fill #0

## 2023-03-02 MED ORDER — ALUM & MAG HYDROXIDE-SIMETH 200-200-20 MG/5ML PO SUSP
15.0000 mL | Freq: Four times a day (QID) | ORAL | Status: DC | PRN
  Administered 2023-03-02: 15 mL via ORAL
  Filled 2023-03-02: qty 30

## 2023-03-02 NOTE — Progress Notes (Addendum)
    [facility]    Date of Admission:  02/28/2023     ID: Cory Russell is a 32 y.o. male with  hiv disease, hx of syphilis, new onset seizure Principal Problem:   Seizure (HCC) Active Problems:   Human immunodeficiency virus (HIV) disease (HCC)   Poor appetite    Subjective: No new seizure though he thought he felt "aura" yesterday. His LP from outside hospital --showed VDRL negative. Last had sex 4 months ago  Felt pills still too large  Medications:   bictegravir-emtricitabine-tenofovir AF  1 tablet Oral Daily   darunavir-cobicistat  1 tablet Oral Q breakfast   mirtazapine  15 mg Oral QHS   penicillin g benzathine (BICILLIN-LA) IM  2.4 Million Units Intramuscular Once   sulfamethoxazole-trimethoprim  1 tablet Oral Once per day on Monday Wednesday Friday   valproic acid  250 mg Oral QID    Objective: Vital signs in last 24 hours: Temp:  [97.9 F (36.6 C)-99 F (37.2 C)] 98 F (36.7 C) (09/26 1116) Pulse Rate:  [63-83] 81 (09/26 1116) Resp:  [18] 18 (09/26 1116) BP: (100-113)/(54-73) 106/65 (09/26 1116) SpO2:  [99 %-100 %] 100 % (09/26 1116)  Physical Exam  Constitutional: He is oriented to person, place, and time. He appears well-developed and well-nourished. No distress.  HENT:  Mouth/Throat: Oropharynx is clear and moist. No oropharyngeal exudate.  Cardiovascular: Normal rate, regular rhythm and normal heart sounds. Exam reveals no gallop and no friction rub.  No murmur heard.  Pulmonary/Chest: Effort normal and breath sounds normal. No respiratory distress. He has no wheezes.  Abdominal: Soft. Bowel sounds are normal. He exhibits no distension. There is no tenderness.  Lymphadenopathy:  He has no cervical adenopathy.  Neurological: He is alert and oriented to person, place, and time.  Skin: Skin is warm and dry. No rash noted. No erythema.  Psychiatric: He has a normal mood and affect. His behavior is normal.    Lab Results Recent  Labs    02/28/23 0952  WBC 4.4  HGB 11.2*  HCT 35.2*  NA 135  K 3.9  CL 103  CO2 25  BUN 10  CREATININE 1.05    RPR: 1:256 Microbiology:  Studies/Results: DG Chest 2 View  Result Date: 02/28/2023 CLINICAL DATA:  Dyspnea. EXAM: CHEST - 2 VIEW COMPARISON:  None Available. FINDINGS: The heart size and mediastinal contours are within normal limits. Both lungs are clear. The visualized skeletal structures are unremarkable. IMPRESSION: No active cardiopulmonary disease. Electronically Signed   By: Kennith Center M.D.   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

## 2023-03-02 NOTE — Plan of Care (Signed)

## 2023-03-02 NOTE — Progress Notes (Addendum)
[facility]  PROGRESS NOTE  Cory Russell [name] DOB: 1990-11-21 DOA: 02/28/2023 PCP: Monna Fam, MD   Brief HPI:    32 y.o. M with HIV/AIDS recently back on ART, recent seizure at [facility] who presented with recurrent seizure.   Just admitted for 3 days (discharged yesterday) from [facility] hospital for three brief witnessed GTC seizures in a row.   MRI w/wo contrast unremarkable.  EEG normal.  LP was acellular, CSF culture NG and crypto antigen (of CSF and blood) negative.  UDS cocaine and THC positive.  Only Tele-neuro available, recommended Keppra BID and no driving.     Assessment/Plan:    Recurrent seizure -Patient had multiple seizures at outside hospital -MRI brain was unremarkable, EEG was normal, LP was normal at outside facility -VDRL CSF was nonreactive at OSH  -Continue divalproex 500 mg p.o. every 12 hours -Neurology has signed off  HIV -Continue Biktarvy, doravirine  History of syphilis -VDRL CSF at OSH was nonreactive    Medications     bictegravir-emtricitabine-tenofovir AF  1 tablet Oral Daily   darunavir-cobicistat  1 tablet Oral Q breakfast   divalproex  500 mg Oral Q12H   mirtazapine  15 mg Oral QHS   sulfamethoxazole-trimethoprim  1 tablet Oral Once per day on Monday Wednesday Friday     Data Reviewed:   CBG:  No results for input(s): "GLUCAP" in the last 168 hours.  SpO2: 100 %    Vitals:   03/01/23 1926 03/01/23 2350 03/02/23 0257 03/02/23 0902  BP: 113/71 (!) 100/54 105/69 111/73  Pulse: 77 75 63 81  Resp: 18   18  Temp: 99 F (37.2 C) 98.3 F (36.8 C) 97.9 F (36.6 C) 98.2 F (36.8 C)  TempSrc: Oral Oral Oral Oral  SpO2: 100% 99% 100% 100%  Weight:      Height:          Data Reviewed:  Basic Metabolic Panel: Recent Labs  Lab 02/28/23 0952  NA 135  K 3.9  CL 103  CO2 25  GLUCOSE 82  BUN 10  CREATININE 1.05  CALCIUM 9.0    CBC: Recent Labs  Lab 02/28/23 0952  WBC 4.4  HGB  11.2*  HCT 35.2*  MCV 86.7  PLT 306    LFT No results for input(s): "AST", "ALT", "ALKPHOS", "BILITOT", "PROT", "ALBUMIN" in the last 168 hours.   Antibiotics: Anti-infectives (From admission, onward)    Start     Dose/Rate Route Frequency Ordered Stop   03/02/23 0800  darunavir-cobicistat (PREZCOBIX) 800-150 MG per tablet 1 tablet        1 tablet Oral Daily with breakfast 03/01/23 1045     03/01/23 1000  sulfamethoxazole-trimethoprim (BACTRIM DS) 800-160 MG per tablet 1 tablet        1 tablet Oral Once per day on Monday Wednesday Friday 02/28/23 1843     02/28/23 2000  bictegravir-emtricitabine-tenofovir AF (BIKTARVY) 50-200-25 MG per tablet 1 tablet        1 tablet Oral Daily 02/28/23 1843     02/28/23 2000  doravirine (PIFELTRO) tablet 100 mg  Status:  Discontinued        100 mg Oral Daily 02/28/23 1843 03/01/23 1048        DVT prophylaxis: Lovenox full held yesterday for possible LP this morning.  LP has been canceled, will restart Lovenox today.  Code Status: Full code  Family Communication: No family at bedside   CONSULTS    Subjective  Denies any complaints.  Objective    Physical Examination:  General-appears in no acute distress Heart-S1-S2, regular, no murmur auscultated Lungs-clear to auscultation bilaterally, no wheezing or crackles auscultated Abdomen-soft, nontender, no organomegaly Extremities-no edema in the lower extremities Neuro-alert, oriented x3, no focal deficit noted    Status is: Inpatient:             Meredeth Ide   [facility] If 7PM-7AM, please contact night-coverage at [email or web address], Office  [phone]   03/02/2023, 10:16 AM  LOS: 1 day

## 2023-03-02 NOTE — Progress Notes (Signed)
Patient had CSF VDRL at OSH that was nonreactive. Per ID no indication for LP. No further inpatient neurologic workup indicated. Continue depakote on hospital discharge. Please reiterate at hospital discharge that per [place] law there is no driving x6 mos after last seizure. I will arrange outpatient neurology f/u. Neurology to sign off but please re-engage if additional neurologic concerns arise.  Bing Neighbors, MD [facility] [phone]  If 7pm- 7am, please page neurology on call as listed in AMION.

## 2023-03-02 NOTE — Plan of Care (Signed)

## 2023-03-03 ENCOUNTER — Other Ambulatory Visit (HOSPITAL_COMMUNITY): Payer: Self-pay

## 2023-03-03 DIAGNOSIS — R630 Anorexia: Secondary | ICD-10-CM | POA: Diagnosis not present

## 2023-03-03 DIAGNOSIS — B20 Human immunodeficiency virus [HIV] disease: Secondary | ICD-10-CM | POA: Diagnosis not present

## 2023-03-03 DIAGNOSIS — R569 Unspecified convulsions: Secondary | ICD-10-CM | POA: Diagnosis not present

## 2023-03-03 DIAGNOSIS — A539 Syphilis, unspecified: Secondary | ICD-10-CM | POA: Diagnosis not present

## 2023-03-03 MED ORDER — POLYETHYLENE GLYCOL 3350 17 G PO PACK: 17.0000 g | PACK | Freq: Every day | ORAL | Status: DC | PRN

## 2023-03-03 MED ORDER — POLYETHYLENE GLYCOL 3350 17 G PO PACK: 17.0000 g | PACK | Freq: Every day | ORAL | Status: DC

## 2023-03-03 MED ORDER — DARUNAVIR-COBICISTAT 800-150 MG PO TABS: 1.0000 | ORAL_TABLET | Freq: Every day | ORAL | 0 refills | Status: DC

## 2023-03-03 MED ORDER — VALPROIC ACID 250 MG/5ML PO SOLN
250.0000 mg | Freq: Four times a day (QID) | ORAL | 3 refills | Status: DC
  Filled 2023-03-03: qty 473, 24d supply, fill #0

## 2023-03-03 NOTE — Progress Notes (Signed)
Nursing Discharge Note   Name: Cory Russell MRN: [id number] DOB: 05-30-1991    Admit Date: 02/28/2023  Discharge Date: 03/03/2023  Cory Russell is to be discharged home per MD order.  AVS completed. Reviewed with patient at bedside. Highlighted copy provided for patient to take home.  Patient able to verbalize understanding of discharge instructions. PIV removed. Patient stable upon discharge.   Patient aware that he is to pick up his new medications at [facility] on his way out the door with [employer].  Patient with questions about disability due to [facility] DMV driving restrictions, I reached out to Case Manager RN for today, Darl Pikes who stated that the hospital is not able to assist with Disability Application, that he should go through his employer and if he is unable to reach out to his county Department of [facility].   Discharge Instructions     Ambulatory referral to Neurology   Complete by: As directed    An appointment is requested in approximately: 4 wks   Diet - low sodium heart healthy   Complete by: As directed    Discharge instructions   Complete by: As directed    Seizure precautions: Per [facility] statutes, patients with seizures are not allowed to drive until they have been seizure-free for six months and cleared by a physician    Use caution when using heavy equipment or power tools. Avoid working on ladders or at heights. Take showers instead of baths. Ensure the water temperature is not too high on the home water heater. Do not go swimming alone. Do not lock yourself in a room alone (i.e. bathroom). When caring for infants or small children, sit down when holding, feeding, or changing them to minimize risk of injury to the child in the event you have a seizure. Maintain good sleep hygiene. Avoid alcohol.    If patient has another seizure, call 911 and bring them back to the ED if: A.  The seizure lasts longer than 5 minutes.      B.  The  patient doesn't wake shortly after the seizure or has new problems such as difficulty seeing, speaking or moving following the seizure C.  The patient was injured during the seizure D.  The patient has a temperature over 102 F (39C) E.  The patient vomited during the seizure and now is having trouble breathing    During the Seizure   - First, ensure adequate ventilation and place patients on the floor on their left side  Loosen clothing around the neck and ensure the airway is patent. If the patient is clenching the teeth, do not force the mouth open with any object as this can cause severe damage - Remove all items from the surrounding that can be hazardous. The patient may be oblivious to what's happening and may not even know what he or she is doing. If the patient is confused and wandering, either gently guide him/her away and block access to outside areas - Reassure the individual and be comforting - Call 911. In most cases, the seizure ends before EMS arrives. However, there are cases when seizures may last over 3 to 5 minutes. Or the individual may have developed breathing difficulties or severe injuries. If a pregnant patient or a person with diabetes develops a seizure, it is prudent to call an ambulance. - Finally, if the patient does not regain full consciousness, then call EMS. Most patients will remain confused for about 45 to  90 minutes after a seizure, so you must use judgment in calling for help. - Avoid restraints but make sure the patient is in a bed with padded side rails - Place the individual in a lateral position with the neck slightly flexed; this will help the saliva drain from the mouth and prevent the tongue from falling backward - Remove all nearby furniture and other hazards from the area - Provide verbal assurance as the individual is regaining consciousness - Provide the patient with privacy if possible - Call for help and start treatment as ordered by the caregiver     After the Seizure (Postictal Stage)   After a seizure, most patients experience confusion, fatigue, muscle pain and/or a headache. Thus, one should permit the individual to sleep. For the next few days, reassurance is essential. Being calm and helping reorient the person is also of importance.   Most seizures are painless and end spontaneously. Seizures are not harmful to others but can lead to complications such as stress on the lungs, brain and the heart. Individuals with prior lung problems may develop labored breathing and respiratory distress.   Increase activity slowly   Complete by: As directed        Allergies as of 03/03/2023   No Known Allergies      Medication List     STOP taking these medications    ibuprofen 800 MG tablet Commonly known as: ADVIL   levETIRAcetam 1000 MG tablet Commonly known as: KEPPRA   Pifeltro 100 MG Tabs tablet Generic drug: doravirine       TAKE these medications    Biktarvy 50-200-25 MG Tabs tablet Generic drug: bictegravir-emtricitabine-tenofovir AF Take 1 tablet by mouth daily.   mirtazapine 15 MG tablet Commonly known as: Remeron Take 1 tablet (15 mg total) by mouth at bedtime.   ondansetron 4 MG tablet Commonly known as: Zofran Take 1 tablet (4 mg total) by mouth daily as needed for nausea or vomiting. Take 30 minutes prior to medication   Prezcobix 800-150 MG tablet Generic drug: darunavir-cobicistat Take 1 tablet by mouth daily with breakfast. Take with food.CRUSH THIS MED AND GIVE WITH APPLESAUCE   sulfamethoxazole-trimethoprim 800-160 MG tablet Commonly known as: BACTRIM DS Take 1 tablet by mouth 3 (three) times a week.   valproic acid 250 MG/5ML solution Commonly known as: DEPAKENE Take 5 mLs (250 mg total) by mouth 4 (four) times daily.   zolpidem 5 MG tablet Commonly known as: AMBIEN Take 1 tablet (5 mg total) by mouth at bedtime as needed for sleep.        Discharge Instructions/ Education: Discharge  instructions given to patient with verbalized understanding. Discharge education completed with patient including: follow up instructions, medication list, discharge activities, and limitations if indicated. Patient instructed to return to Emergency Department, call 911, or call MD for any changes in condition.  Patient escorted via wheelchair to lobby and discharged home via private automobile.

## 2023-03-03 NOTE — Progress Notes (Signed)
    [facility]    Date of Admission:  02/28/2023             ID: Cory Russell is a 31 y.o. male with  Principal Problem:   Seizure (HCC) Active Problems:   Human immunodeficiency virus (HIV) disease (HCC)   Poor appetite    Subjective: Afebrile. No further seizure  Medications:   bictegravir-emtricitabine-tenofovir AF  1 tablet Oral Daily   darunavir-cobicistat  1 tablet Oral Q breakfast   enoxaparin (LOVENOX) injection  40 mg Subcutaneous Q24H   mirtazapine  15 mg Oral QHS   sulfamethoxazole-trimethoprim  1 tablet Oral Once per day on Monday Wednesday Friday   valproic acid  250 mg Oral QID    Objective: Vital signs in last 24 hours: Temp:  [98 F (36.7 C)-99.5 F (37.5 C)] 98 F (36.7 C) (09/27 0822) Pulse Rate:  [61-88] 73 (09/27 0822) Resp:  [17-18] 18 (09/27 0822) BP: (106-128)/(62-84) 114/75 (09/27 0822) SpO2:  [97 %-100 %] 100 % (09/27 1610) Physical Exam  Constitutional: He is oriented to person, place, and time. He appears well-developed and well-nourished. No distress.  HENT:  Mouth/Throat: Oropharynx is clear and moist. No oropharyngeal exudate.  Cardiovascular: Normal rate, regular rhythm and normal heart sounds. Exam reveals no gallop and no friction rub.  No murmur heard.  Pulmonary/Chest: Effort normal and breath sounds normal. No respiratory distress. He has no wheezes.  Abdominal: Soft. Bowel sounds are normal. He exhibits no distension. There is no tenderness.  Lymphadenopathy:  He has no cervical adenopathy.  Neurological: He is alert and oriented to person, place, and time.  Skin: Skin is warm and dry. No rash noted. No erythema.  Psychiatric: He has a normal mood and affect. His behavior is normal.    Lab Results  Microbiology:  Studies/Results: No results found.   Assessment/Plan: HIV disease = will plan to switch him back to biktarvy- prezcobix (crushed) -- can place with applesauce  Oi proph = bactrim ds  m-w-f  New onset seizure = now on depakote liquid  Syphilis = treated with IM PCN  We will see back in ID clinic 0n 10/15  [facility] for Infectious Diseases Pager: [phone]  03/03/2023, 2:43 PM

## 2023-03-03 NOTE — Plan of Care (Signed)

## 2023-03-03 NOTE — Discharge Summary (Addendum)
Physician Discharge Summary   Patient: Cory Russell MRN: [id number] DOB: [birth year]  Admit date:     02/28/2023  Discharge date: 03/03/23  Discharge Physician: Meredeth Ide   PCP: Monna Fam, MD   Recommendations at discharge:   Follow-up infectious disease outpatient.  Discharge Diagnoses: Principal Problem:   Seizure (HCC) Active Problems:   Human immunodeficiency virus (HIV) disease (HCC)   Poor appetite  Resolved Problems:   * No resolved hospital problems. *  Hospital Course:  32 y.o. M with HIV/AIDS recently back on ART, recent seizure at [facility] who presented with recurrent seizure.   Just admitted for 3 days (discharged yesterday) from [facility] hospital for three brief witnessed GTC seizures in a row.   MRI w/wo contrast unremarkable.  EEG normal.  LP was acellular, CSF culture NG and crypto antigen (of CSF and blood) negative.  UDS cocaine and THC positive.  Only Tele-neuro available, recommended Keppra BID and no driving.   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 Avoid working on ladders or at heights. Take showers instead of baths. Ensure the water temperature is not too high on the home water heater. Do not go swimming alone. Do not lock yourself in a room alone (i.e. bathroom). When  caring for infants or small children, sit down when holding, feeding, or changing them to minimize risk of injury to the child in the event you have a seizure. Maintain good sleep hygiene. Avoid alcohol.    If patient has another seizure, call 911 and bring them back to the ED if: A.  The seizure lasts longer than 5 minutes.      B.  The patient doesn't wake shortly after the seizure or has new problems such as difficulty seeing, speaking or moving following the seizure C.  The patient was injured during the seizure D.  The patient has a temperature over 102 F (39C) E.  The patient vomited during the seizure and now is having trouble breathing    During the Seizure   - First, ensure adequate ventilation and place patients on the floor on their left side  Loosen clothing around the neck and ensure the airway is patent. If the patient is clenching the teeth, do not force the mouth open with any object as this can cause severe damage - Remove all items from the surrounding that can be hazardous. The patient may be oblivious to what's happening and may not even know what he or she is doing. If the patient is confused and wandering, either gently guide him/her away and block access to outside areas - Reassure the individual and be comforting - Call 911. In most cases, the seizure ends before EMS arrives. However, there are cases when seizures may last over 3 to 5 minutes. Or the individual may have developed  breathing difficulties or severe injuries. If a pregnant patient or a person with diabetes develops a seizure, it is prudent to call an ambulance. - Finally, if the patient does not regain full consciousness, then call EMS. Most patients will remain confused for about 45 to 90 minutes after a seizure, so you must use judgment in calling for help. - Avoid restraints but make sure the patient is in a bed with padded side rails - Place the individual in a lateral position with the neck slightly  flexed; this will help the saliva drain from the mouth and prevent the tongue from falling backward - Remove all nearby furniture and other hazards from the area - Provide verbal assurance as the individual is regaining consciousness - Provide the patient with privacy if possible - Call for help and start treatment as ordered by the caregiver    After the Seizure (Postictal Stage)   After a seizure, most patients experience confusion, fatigue, muscle pain and/or a headache. Thus, one should permit the individual to sleep. For the next few days, reassurance is essential. Being calm and helping reorient the person is also of importance.   Most seizures are painless and end spontaneously. Seizures are not harmful to others but can lead to complications such as stress on the lungs, brain and the heart. Individuals with prior lung problems may develop labored breathing and respiratory distress.       Consultants: ID, Neurology Procedures performed:  Disposition: Home Diet recommendation:  Discharge Diet Orders (From admission, onward)     Start     Ordered   03/03/23 0000  Diet - low sodium heart healthy        03/03/23 1159           Regular diet DISCHARGE MEDICATION: Allergies as of 03/03/2023   No Known Allergies      Medication List     STOP taking these medications    ibuprofen 800 MG tablet Commonly known as: ADVIL   levETIRAcetam 1000 MG tablet Commonly known as: KEPPRA   Pifeltro 100 MG Tabs tablet Generic drug: doravirine       TAKE these medications    Biktarvy 50-200-25 MG Tabs tablet Generic drug: bictegravir-emtricitabine-tenofovir AF Take 1 tablet by mouth daily.   darunavir-cobicistat 800-150 MG tablet Commonly known as: PREZCOBIX Take 1 tablet by mouth daily with breakfast. Take with food.CRUSH THIS MED AND GIVE WITH APPLESAUCE   mirtazapine 15 MG tablet Commonly known as: Remeron Take 1 tablet (15 mg total) by mouth at bedtime.    ondansetron 4 MG tablet Commonly known as: Zofran Take 1 tablet (4 mg total) by mouth daily as needed for nausea or vomiting. Take 30 minutes prior to medication   sulfamethoxazole-trimethoprim 800-160 MG tablet Commonly known as: BACTRIM DS Take 1 tablet by mouth 3 (three) times a week.   valproic acid 250 MG/5ML solution Commonly known as: DEPAKENE Take 5 mLs (250 mg total) by mouth 4 (four) times daily.   zolpidem 5 MG tablet Commonly known as: AMBIEN Take 1 tablet (5 mg total) by mouth at bedtime as needed for sleep.        Follow-up Information     Judyann Munson, MD. Schedule an appointment as soon as possible for a visit.   Specialty: Infectious Diseases Contact information: [address] [place] [address] [phone]         Monna Fam, MD Follow up in 2 week(s).   Specialty: Internal Medicine Contact  information: [address] [place] [address] [phone]                Discharge Exam: Ceasar Mons Weights   02/28/23 0945  Weight: 56.7 kg   Appears in no acute distree S1s2 rrr Lungs cta  Condition at discharge: good  The results of significant diagnostics from this hospitalization (including imaging, microbiology, ancillary and laboratory) are listed below for reference.   Imaging Studies: DG Chest 2 View  Result Date: 02/28/2023 CLINICAL DATA:  Dyspnea. EXAM: CHEST - 2 VIEW COMPARISON:  None Available. FINDINGS: The heart size and mediastinal contours are within normal limits. Both lungs are clear. The visualized skeletal structures are unremarkable. IMPRESSION: No active cardiopulmonary disease. Electronically Signed   By: Kennith Center M.D.   On: 02/28/2023 17:31    Microbiology: Results for orders placed or performed in visit on 06/17/22  GC/CT Probe, Amp (Throat)     Status: None   Collection Time: 06/17/22 11:26 AM   Specimen: Throat  Result Value Ref Range Status   Chlamydia trachomatis RNA NOT DETECTED NOT  DETECTED Final   Neisseria gonorrhoeae RNA NOT DETECTED NOT DETECTED Final    Comment: The analytical performance characteristics of this assay have been determined by [employer]. The modifications have not been cleared or approved by the FDA. This assay has been validated pursuant to the CLIA regulations and is used for clinical purposes. Marland Kitchen   CT/NG RNA, TMA Rectal     Status: Abnormal   Collection Time: 06/17/22 11:26 AM   Specimen: Throat  Result Value Ref Range Status   Chlamydia Trachomatis RNA DETECTED (A) NOT DETECTED Final    Comment: Verified by repeat analysis. . . A positive CT Nucleic Acid Amplification Test (NAAT) result should be considered presumptive evidence of infection. The result should be evaluated along with physical examination and other diagnostic findings. .    Neisseria Gonorrhoeae RNA DETECTED (A) NOT DETECTED Final    Comment: Verified by repeat analysis. . . A positive NG Nucleic Acid Amplification Test (NAAT) result should be considered presumptive evidence of infection. The result should be evaluated along with physical examination and other diagnostic findings. . The analytical performance characteristics of this assay have been determined by [facility]. The modifications have not been cleared or approved by the FDA. This assay has been validated pursuant to the CLIA regulations and is used for clinical purposes. Salena Saner trachomatis/N. gonorrhoeae RNA     Status: None   Collection Time: 06/17/22 11:26 AM   Specimen: Throat  Result Value Ref Range Status   C. trachomatis RNA, TMA NOT DETECTED NOT DETECTED Final   N. gonorrhoeae RNA, TMA NOT DETECTED NOT DETECTED Final    Comment: The analytical performance characteristics of this assay, when used to test SurePath(TM) specimens have been determined by [employer]. The modifications have not been cleared or approved by the FDA. This assay has been validated pursuant to  the CLIA regulations and is used for clinical purposes. . For additional information, please refer to https://education.questdiagnostics.com/faq/FAQ154 (This link is being provided for information/ educational purposes only.) .     Labs: CBC: Recent Labs  Lab 02/28/23 0952  WBC 4.4  HGB 11.2*  HCT 35.2*  MCV 86.7  PLT 306   Basic Metabolic Panel: Recent Labs  Lab 02/28/23 0952  NA 135  K 3.9  CL 103  CO2 25  GLUCOSE 82  BUN 10  CREATININE 1.05  CALCIUM 9.0  Liver Function Tests: No results for input(s): "AST", "ALT", "ALKPHOS", "BILITOT", "PROT", "ALBUMIN" in the last 168 hours. CBG: No results for input(s): "GLUCAP" in the last 168 hours.  Discharge time spent: greater than 30 minutes.  Signed: Meredeth Ide, MD [facility] 03/03/2023

## 2023-03-06 ENCOUNTER — Telehealth: Payer: Self-pay

## 2023-03-06 NOTE — Transitions of Care (Post Inpatient/ED Visit) (Signed)
03/06/2023  Name: Cory Russell MRN: [id number] DOB: 02/15/1991  Today's TOC FU Call Status: Today's TOC FU Call Status:: Unsuccessful Call (1st Attempt) Unsuccessful Call (1st Attempt) Date: 03/06/23  Attempted to reach the patient regarding the most recent Inpatient/ED visit.  Follow Up Plan: Additional outreach attempts will be made to reach the patient to complete the Transitions of Care (Post Inpatient/ED visit) call.   Jodelle Gross RN, BSN, CCM RN Care Manager  Transitions of Care  [facility] - [facility]  [phone]

## 2023-03-09 ENCOUNTER — Telehealth: Payer: Self-pay

## 2023-03-09 NOTE — Transitions of Care (Post Inpatient/ED Visit) (Signed)
03/09/2023  Name: Cory Russell MRN: [id number] DOB: [birth year]  Today's TOC FU Call Status: Today's TOC FU Call Status:: Unsuccessful Call (2nd Attempt) Unsuccessful Call (2nd Attempt) Date: 03/09/23  Attempted to reach the patient regarding the most recent Inpatient/ED visit.  Follow Up Plan: Additional outreach attempts will be made to reach the patient to complete the Transitions of Care (Post Inpatient/ED visit) call.   Jodelle Gross RN, BSN, CCM RN Care Manager  Transitions of Care  [facility] - [facility]  [phone]

## 2023-03-10 ENCOUNTER — Telehealth: Payer: Self-pay

## 2023-03-10 NOTE — Transitions of Care (Post Inpatient/ED Visit) (Signed)
03/10/2023  Name: Cory Russell MRN: [id number] DOB: 06/09/1990  Today's TOC FU Call Status: Today's TOC FU Call Status:: Unsuccessful Call (3rd Attempt) Unsuccessful Call (3rd Attempt) Date: 03/10/23  Attempted to reach the patient regarding the most recent Inpatient/ED visit.  Follow Up Plan: No further outreach attempts will be made at this time. We have been unable to contact the patient.  Jodelle Gross RN, BSN, CCM RN Care Manager  Transitions of Care  [facility] - [facility]  [phone]

## 2023-03-13 ENCOUNTER — Other Ambulatory Visit: Payer: Self-pay

## 2023-03-13 ENCOUNTER — Other Ambulatory Visit (HOSPITAL_COMMUNITY): Payer: Self-pay

## 2023-03-14 ENCOUNTER — Other Ambulatory Visit: Payer: Self-pay

## 2023-03-21 ENCOUNTER — Ambulatory Visit: Payer: No Typology Code available for payment source | Admitting: Internal Medicine

## 2023-03-22 ENCOUNTER — Telehealth: Payer: Self-pay

## 2023-03-22 NOTE — Telephone Encounter (Signed)
Patient walked in stated that he was present for an appt w/ Dr. Drue Second. Notified pt that his appt was yesterday 03/21/2023 at 2:15pm. He had mistaken the day of his appt. I did offer to reschedule this for him, for Dr. Drue Second is not in clinic today to see if we are able to work in. He declined stated he will call and walked out the clinic.

## 2023-03-24 ENCOUNTER — Other Ambulatory Visit (HOSPITAL_COMMUNITY): Payer: Self-pay

## 2023-03-24 ENCOUNTER — Other Ambulatory Visit: Payer: Self-pay

## 2023-03-24 MED ORDER — DARUNAVIR-COBICISTAT 800-150 MG PO TABS
1.0000 | ORAL_TABLET | Freq: Every day | ORAL | 0 refills | Status: DC
  Filled 2023-03-27 – 2023-03-29 (×2): qty 30, 30d supply, fill #0

## 2023-03-27 ENCOUNTER — Other Ambulatory Visit: Payer: Self-pay

## 2023-03-29 ENCOUNTER — Other Ambulatory Visit: Payer: Self-pay

## 2023-03-29 ENCOUNTER — Other Ambulatory Visit (HOSPITAL_COMMUNITY): Payer: Self-pay

## 2023-03-29 ENCOUNTER — Other Ambulatory Visit: Payer: Self-pay | Admitting: Internal Medicine

## 2023-03-29 NOTE — Progress Notes (Signed)
Specialty Pharmacy Refill Coordination Note  Cory Russell is a 32 y.o. male contacted today regarding refills of specialty medication(s) Bictegravir-Emtricitab-Tenofov; Darunavir-Cobicistat   Patient requested Delivery   Delivery date: 04/10/23   Verified address: [address]   [place] [address]   Medication will be filled on 04/07/23.

## 2023-03-29 NOTE — Telephone Encounter (Signed)
Okay to refill? 

## 2023-03-30 ENCOUNTER — Telehealth: Payer: Self-pay

## 2023-03-30 ENCOUNTER — Other Ambulatory Visit (HOSPITAL_COMMUNITY): Payer: Self-pay

## 2023-03-30 NOTE — Telephone Encounter (Signed)
Per Dr. Drue Second she will discuss Ambien refills with the patient at his appointment and discuss primary care with him.  Kadince Boxley Jonathon Resides, CMA

## 2023-04-06 ENCOUNTER — Ambulatory Visit: Payer: No Typology Code available for payment source | Admitting: Internal Medicine

## 2023-04-07 ENCOUNTER — Other Ambulatory Visit (HOSPITAL_COMMUNITY): Payer: Self-pay

## 2023-04-10 ENCOUNTER — Telehealth: Payer: Self-pay

## 2023-04-10 NOTE — Telephone Encounter (Signed)
Cory Russell called, he's noticed some boils pop up over the last week or so. He's seen one on his thigh, one on his buttocks, one on his arm, and one on his lip. Reports these all popped up but resolved.   He now has bumps in both axilla that are the size of grapes. Says he has 3 on the right side and 2 on the left. They have been there for about a week, have been sore for the last 3-4 days, and are now draining.   He sees Dr. Drue Second on 11/6, we discussed that he could go to urgent care for sooner evaluation as he is worries about this in conjunction with his low CD4 count. Patient verbalized understanding and has no further questions.   [name], RN

## 2023-04-12 ENCOUNTER — Ambulatory Visit (INDEPENDENT_AMBULATORY_CARE_PROVIDER_SITE_OTHER): Payer: No Typology Code available for payment source | Admitting: Internal Medicine

## 2023-04-12 ENCOUNTER — Other Ambulatory Visit: Payer: Self-pay

## 2023-04-12 ENCOUNTER — Encounter: Payer: Self-pay | Admitting: Internal Medicine

## 2023-04-12 VITALS — BP 115/74 | HR 77 | Resp 16 | Ht 67.0 in | Wt 126.4 lb

## 2023-04-12 DIAGNOSIS — I890 Lymphedema, not elsewhere classified: Secondary | ICD-10-CM | POA: Diagnosis not present

## 2023-04-12 DIAGNOSIS — I889 Nonspecific lymphadenitis, unspecified: Secondary | ICD-10-CM

## 2023-04-12 DIAGNOSIS — B20 Human immunodeficiency virus [HIV] disease: Secondary | ICD-10-CM

## 2023-04-12 DIAGNOSIS — L0292 Furuncle, unspecified: Secondary | ICD-10-CM

## 2023-04-12 MED ORDER — VALPROIC ACID 250 MG/5ML PO SOLN
250.0000 mg | Freq: Four times a day (QID) | ORAL | 3 refills | Status: DC
  Filled 2023-04-12: qty 600, 30d supply, fill #0

## 2023-04-12 MED ORDER — DARUNAVIR-COBICISTAT 800-150 MG PO TABS
1.0000 | ORAL_TABLET | Freq: Every day | ORAL | 0 refills | Status: DC
  Filled 2023-04-12 – 2023-04-25 (×2): qty 30, 30d supply, fill #0

## 2023-04-12 MED ORDER — BICTEGRAVIR-EMTRICITAB-TENOFOV 50-200-25 MG PO TABS
1.0000 | ORAL_TABLET | Freq: Every day | ORAL | 11 refills | Status: DC
  Filled 2023-04-12 – 2023-04-25 (×2): qty 30, 30d supply, fill #0
  Filled 2023-05-24: qty 30, 30d supply, fill #1

## 2023-04-12 MED ORDER — MIRTAZAPINE 15 MG PO TABS
15.0000 mg | ORAL_TABLET | Freq: Every day | ORAL | 3 refills | Status: DC
  Filled 2023-04-12: qty 30, 30d supply, fill #0

## 2023-04-12 NOTE — Progress Notes (Signed)
Patient ID: Cory Russell, male   DOB: 1990-11-30, 32 y.o.   MRN: [id number]  HPI 32yo M with HIV disease, recently back onto his HIV meds. For the past 3 weeks having more fevers at night. And starting to notice abscess on legs now cleared but also 1 week of marked LN swelling in both axilla.  Had also skin abscess on buttock  Off of seizure medicines last 2 weeks  Also don't have remeron  No unprotected sex   Outpatient Encounter Medications as of 04/12/2023  Medication Sig   bictegravir-emtricitabine-tenofovir AF (BIKTARVY) 50-200-25 MG TABS tablet Take 1 tablet by mouth daily.   darunavir-cobicistat (PREZCOBIX) 800-150 MG tablet Take 1 tablet by mouth daily with breakfast. Take with food.CRUSH THIS MED AND GIVE WITH APPLESAUCE   mirtazapine (REMERON) 15 MG tablet Take 1 tablet (15 mg total) by mouth at bedtime.   ondansetron (ZOFRAN) 4 MG tablet Take 1 tablet (4 mg total) by mouth daily as needed for nausea or vomiting. Take 30 minutes prior to medication   sulfamethoxazole-trimethoprim (BACTRIM DS) 800-160 MG tablet Take 1 tablet by mouth 3 (three) times a week.   valproic acid (DEPAKENE) 250 MG/5ML solution Take 5 mLs (250 mg total) by mouth 4 (four) times daily.   zolpidem (AMBIEN) 5 MG tablet Take 1 tablet (5 mg total) by mouth at bedtime as needed for sleep.   No facility-administered encounter medications on file as of 04/12/2023.     Patient Active Problem List   Diagnosis Date Noted   Seizure (HCC) 02/28/2023   Nausea 01/05/2023   HIV (human immunodeficiency virus infection) (HCC)    Hypertension    Rectal mass 09/30/2019   Syphilis 09/30/2019   Hemorrhoids 01/23/2019   Routine screening for STI (sexually transmitted infection) 11/27/2018   Healthcare maintenance 06/25/2018   Depression 01/26/2015   Encounter for long-term (current) use of medications 08/28/2014   Poor appetite 08/28/2014   Human immunodeficiency virus (HIV) disease (HCC) 12/20/2013      Health Maintenance Due  Topic Date Due   DTaP/Tdap/Td (7 - Td or Tdap) 11/20/2017     Review of Systems Still having insomnia  Physical Exam   BP 115/74   Pulse 77   Resp 16   Ht 5\' 7"  (1.702 m)   Wt 126 lb 6.4 oz (57.3 kg)   BMI 19.80 kg/m   Physical Exam  Constitutional: He is oriented to person, place, and time. He appears well-developed and well-nourished. No distress.  HENT:  Mouth/Throat: Oropharynx is clear and moist. No oropharyngeal exudate.  Cardiovascular: Normal rate, regular rhythm and normal heart sounds. Exam reveals no gallop and no friction rub.  No murmur heard.  Pulmonary/Chest: Effort normal and breath sounds normal. No respiratory distress. He has no wheezes.  Abdominal: Soft. Bowel sounds are normal. He exhibits no distension. There is no tenderness.  Lymphadenopathy:  He has no cervical adenopathy.  Neurological: He is alert and oriented to person, place, and time.  Skin: Skin is warm and dry. No rash noted. No erythema.  Psychiatric: He has a normal mood and affect. His behavior is normal.   Lab Results  Component Value Date   CD4TCELL 9 (L) 02/28/2023   Lab Results  Component Value Date   CD4TABS 112 (L) 02/28/2023   CD4TABS 144 (L) 01/04/2023   CD4TABS 129 (L) 11/15/2022   Lab Results  Component Value Date   HIV1RNAQUANT 429,000 (H) 02/21/2023   Lab Results  Component Value Date  HEPBSAB POS (A) 12/02/2013   Lab Results  Component Value Date   LABRPR REACTIVE (A) 02/21/2023    CBC Lab Results  Component Value Date   WBC 4.4 02/28/2023   RBC 4.06 (L) 02/28/2023   HGB 11.2 (L) 02/28/2023   HCT 35.2 (L) 02/28/2023   PLT 306 02/28/2023   MCV 86.7 02/28/2023   MCH 27.6 02/28/2023   MCHC 31.8 02/28/2023   RDW 15.9 (H) 02/28/2023   LYMPHSABS 1,670 11/15/2022   MONOABS 444 03/08/2016   EOSABS 0 (L) 11/15/2022    BMET Lab Results  Component Value Date   NA 135 02/28/2023   K 3.9 02/28/2023   CL 103 02/28/2023   CO2  25 02/28/2023   GLUCOSE 82 02/28/2023   BUN 10 02/28/2023   CREATININE 1.05 02/28/2023   CALCIUM 9.0 02/28/2023   GFRNONAA >60 02/28/2023   GFRAA 127 05/19/2020    Assessment and Plan  HIV disease= continue on biktarvy  Concern for immune reconstitution. Lymphadenitis?  Boils = can take bactrim DS twice a day for 5 days if recurs  Lymphadenitis = ? 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 Refer for excisional biopsy if not improved over the next week.

## 2023-04-13 LAB — T-HELPER CELL (CD4) - (RCID CLINIC ONLY)
CD4 % Helper T Cell: 8 % — ABNORMAL LOW (ref 33–65)
CD4 T Cell Abs: 124 /uL — ABNORMAL LOW (ref 400–1790)

## 2023-04-18 LAB — COMPLETE METABOLIC PANEL WITH GFR
AG Ratio: 0.8 (calc) — ABNORMAL LOW (ref 1.0–2.5)
ALT: 9 U/L (ref 9–46)
AST: 17 U/L (ref 10–40)
Albumin: 3.6 g/dL (ref 3.6–5.1)
Alkaline phosphatase (APISO): 66 U/L (ref 36–130)
BUN: 11 mg/dL (ref 7–25)
CO2: 28 mmol/L (ref 20–32)
Calcium: 9 mg/dL (ref 8.6–10.3)
Chloride: 100 mmol/L (ref 98–110)
Creat: 0.86 mg/dL (ref 0.60–1.26)
Globulin: 4.4 g/dL — ABNORMAL HIGH (ref 1.9–3.7)
Glucose, Bld: 156 mg/dL — ABNORMAL HIGH (ref 65–99)
Potassium: 3.3 mmol/L — ABNORMAL LOW (ref 3.5–5.3)
Sodium: 136 mmol/L (ref 135–146)
Total Bilirubin: 0.4 mg/dL (ref 0.2–1.2)
Total Protein: 8 g/dL (ref 6.1–8.1)
eGFR: 117 mL/min/{1.73_m2} (ref >=60)

## 2023-04-18 LAB — CBC WITH DIFFERENTIAL/PLATELET
Absolute Lymphocytes: 1777 {cells}/uL (ref 850–3900)
Absolute Monocytes: 218 {cells}/uL (ref 200–950)
Basophils Absolute: 8 {cells}/uL (ref 0–200)
Basophils Relative: 0.2 %
Eosinophils Absolute: 71 {cells}/uL (ref 15–500)
Eosinophils Relative: 1.7 %
HCT: 31.7 % — ABNORMAL LOW (ref 38.5–50.0)
Hemoglobin: 10.1 g/dL — ABNORMAL LOW (ref 13.2–17.1)
MCH: 28.3 pg (ref 27.0–33.0)
MCHC: 31.9 g/dL — ABNORMAL LOW (ref 32.0–36.0)
MCV: 88.8 fL (ref 80.0–100.0)
MPV: 8.8 fL (ref 7.5–12.5)
Monocytes Relative: 5.2 %
Neutro Abs: 2125 {cells}/uL (ref 1500–7800)
Neutrophils Relative %: 50.6 %
Platelets: 280 10*3/uL (ref 140–400)
RBC: 3.57 10*6/uL — ABNORMAL LOW (ref 4.20–5.80)
RDW: 14.6 % (ref 11.0–15.0)
Total Lymphocyte: 42.3 %
WBC: 4.2 10*3/uL (ref 3.8–10.8)

## 2023-04-18 LAB — HIV-1 RNA QUANT-NO REFLEX-BLD
HIV 1 RNA Quant: 22700 {copies}/mL — ABNORMAL HIGH
HIV-1 RNA Quant, Log: 4.36 {Log_copies}/mL — ABNORMAL HIGH

## 2023-04-18 LAB — RPR TITER: RPR Titer: 1:256 {titer} — ABNORMAL HIGH

## 2023-04-18 LAB — RPR: RPR Ser Ql: REACTIVE — AB

## 2023-04-18 LAB — T PALLIDUM AB: T Pallidum Abs: POSITIVE — AB

## 2023-04-25 ENCOUNTER — Other Ambulatory Visit: Payer: Self-pay

## 2023-04-25 ENCOUNTER — Other Ambulatory Visit: Payer: Self-pay | Admitting: Internal Medicine

## 2023-04-25 ENCOUNTER — Other Ambulatory Visit (HOSPITAL_COMMUNITY): Payer: Self-pay

## 2023-04-25 NOTE — Progress Notes (Signed)
Specialty Pharmacy Ongoing Clinical Assessment Note  Cory Russell is a 32 y.o. male who is being followed by the specialty pharmacy service for RxSp HIV   Patient's specialty medication(s) reviewed today: Bictegravir-Emtricitab-Tenofov; Darunavir-Cobicistat   Missed doses in the last 4 weeks: 0   Patient/Caregiver did not have any additional questions or concerns.   Therapeutic benefit summary: Patient is achieving benefit   Adverse events/side effects summary: No adverse events/side effects   Patient's therapy is appropriate to: Continue    Goals Addressed             This Visit's Progress    Achieve Undetectable HIV Viral Load < 20       Patient is not on track and improving. Patient will maintain adherence. Viral load decreased to 22,700 on 04/12/23 labs.          Follow up:  3 months  Otto Herb Specialty Pharmacist

## 2023-04-25 NOTE — Telephone Encounter (Signed)
Please advise if okay to refill. 

## 2023-04-25 NOTE — Progress Notes (Signed)
Specialty Pharmacy Refill Coordination Note  Cory Russell is a 32 y.o. male contacted today regarding refills of specialty medication(s) Bictegravir-Emtricitab-Tenofov; Darunavir-Cobicistat   Patient requested Delivery   Delivery date: 05/05/23   Verified address: [address]   [place] [address]   Medication will be filled on 05/03/23.

## 2023-05-03 ENCOUNTER — Other Ambulatory Visit: Payer: Self-pay

## 2023-05-08 NOTE — Telephone Encounter (Signed)
Appt 1/25

## 2023-05-11 ENCOUNTER — Ambulatory Visit: Payer: No Typology Code available for payment source | Admitting: Internal Medicine

## 2023-05-22 ENCOUNTER — Telehealth: Payer: Self-pay

## 2023-05-22 NOTE — Telephone Encounter (Signed)
Detectable Viral Load Intervention (DVL)  Most recent VL:  HIV 1 RNA Quant  Date Value Ref Range Status  04/12/2023 22,700 (H) Copies/mL Final  02/21/2023 429,000 (H) Copies/mL Final  11/15/2022 241,000 (H) Copies/mL Final    Last Clinic Visit: 04/12/23  Current ART regimen: Biktarvy  Appointment status: patient does not have future appointment scheduled   Medication last dispensed (per chart review):   Medication Adherence   Not able assess.   Barriers to Care   Not able to assess.    Interventions   Called patient to discuss medication adherence and possible barriers to care. Will send mychart message. Juanita Laster, RMA

## 2023-05-24 ENCOUNTER — Other Ambulatory Visit: Payer: Self-pay | Admitting: Internal Medicine

## 2023-05-24 ENCOUNTER — Other Ambulatory Visit: Payer: Self-pay

## 2023-05-24 NOTE — Progress Notes (Signed)
Specialty Pharmacy Refill Coordination Note  Cory Russell is a 32 y.o. male contacted today regarding refills of specialty medication(s) Bictegravir-Emtricitab-Tenofov (BIKTARVY); Darunavir-Cobicistat (PREZCOBIX)   Patient requested Delivery   Delivery date: 05/30/23   Verified address: [address]   [place] [address]   Medication will be filled on 05/29/23.   Pending refill request (Prezcobix)-- call if any delays

## 2023-05-25 ENCOUNTER — Other Ambulatory Visit: Payer: Self-pay

## 2023-05-25 ENCOUNTER — Other Ambulatory Visit (HOSPITAL_COMMUNITY): Payer: Self-pay

## 2023-05-25 MED ORDER — PREZCOBIX 800-150 MG PO TABS
1.0000 | ORAL_TABLET | Freq: Every day | ORAL | 0 refills | Status: AC
  Filled 2023-05-25: qty 30, 30d supply, fill #0

## 2023-05-29 ENCOUNTER — Other Ambulatory Visit: Payer: Self-pay

## 2023-06-08 ENCOUNTER — Other Ambulatory Visit (HOSPITAL_COMMUNITY): Payer: Self-pay

## 2023-06-16 ENCOUNTER — Other Ambulatory Visit: Payer: Self-pay

## 2023-06-19 ENCOUNTER — Other Ambulatory Visit (HOSPITAL_COMMUNITY): Payer: Self-pay

## 2023-06-26 ENCOUNTER — Ambulatory Visit: Payer: No Typology Code available for payment source | Admitting: Neurology

## 2023-07-13 ENCOUNTER — Other Ambulatory Visit: Payer: Self-pay

## 2023-07-17 ENCOUNTER — Other Ambulatory Visit: Payer: Self-pay

## 2023-07-19 ENCOUNTER — Other Ambulatory Visit: Payer: Self-pay

## 2023-07-21 ENCOUNTER — Other Ambulatory Visit: Payer: Self-pay | Admitting: Pharmacist

## 2023-07-21 NOTE — Progress Notes (Signed)
Specialty Pharmacy Ongoing Clinical Assessment Note  Cory Russell is a 33 y.o. male who is being followed by the specialty pharmacy service for RxSp HIV   Patient's specialty medication(s) reviewed today: Bictegravir-Emtricitab-Tenofov (BIKTARVY); Darunavir-Cobicistat (Prezcobix)   Missed doses in the last 4 weeks: More than 5   Patient/Caregiver did not have any additional questions or concerns.   Therapeutic benefit summary: Patient is NOT achieving benefit   Adverse events/side effects summary: Unable to assess   Patient's therapy is appropriate to: Continue    Goals Addressed   None     Follow up:  3 months  Jennette Kettle Specialty Pharmacist

## 2023-08-02 ENCOUNTER — Other Ambulatory Visit: Payer: Self-pay | Admitting: Internal Medicine

## 2023-08-02 ENCOUNTER — Other Ambulatory Visit (HOSPITAL_COMMUNITY): Payer: Self-pay

## 2023-08-03 ENCOUNTER — Other Ambulatory Visit: Payer: Self-pay

## 2023-08-09 ENCOUNTER — Other Ambulatory Visit (HOSPITAL_COMMUNITY): Payer: Self-pay

## 2023-10-03 ENCOUNTER — Emergency Department (HOSPITAL_COMMUNITY)
Admission: EM | Admit: 2023-10-03 | Discharge: 2023-10-03 | Disposition: A | Discharge status: Home / Self Care | Attending: Emergency Medicine | Admitting: Emergency Medicine

## 2023-10-03 ENCOUNTER — Encounter (HOSPITAL_COMMUNITY): Payer: Self-pay | Admitting: Emergency Medicine

## 2023-10-03 ENCOUNTER — Telehealth: Payer: Self-pay

## 2023-10-03 ENCOUNTER — Other Ambulatory Visit: Payer: Self-pay

## 2023-10-03 DIAGNOSIS — K644 Residual hemorrhoidal skin tags: Secondary | ICD-10-CM | POA: Diagnosis not present

## 2023-10-03 DIAGNOSIS — Z21 Asymptomatic human immunodeficiency virus [HIV] infection status: Secondary | ICD-10-CM | POA: Insufficient documentation

## 2023-10-03 DIAGNOSIS — K625 Hemorrhage of anus and rectum: Secondary | ICD-10-CM | POA: Insufficient documentation

## 2023-10-03 LAB — URINALYSIS, ROUTINE W REFLEX MICROSCOPIC
Bilirubin Urine: NEGATIVE
Glucose, UA: NEGATIVE mg/dL
Hgb urine dipstick: NEGATIVE
Ketones, ur: NEGATIVE mg/dL
Leukocytes,Ua: NEGATIVE
Nitrite: NEGATIVE
Protein, ur: NEGATIVE mg/dL
Specific Gravity, Urine: 1.027 (ref 1.005–1.030)
pH: 6 (ref 5.0–8.0)

## 2023-10-03 LAB — CBC
HCT: 32.9 % — ABNORMAL LOW (ref 39.0–52.0)
Hemoglobin: 10.8 g/dL — ABNORMAL LOW (ref 13.0–17.0)
MCH: 28.9 pg (ref 26.0–34.0)
MCHC: 32.8 g/dL (ref 30.0–36.0)
MCV: 88 fL (ref 80.0–100.0)
Platelets: 171 10*3/uL (ref 150–400)
RBC: 3.74 MIL/uL — ABNORMAL LOW (ref 4.22–5.81)
RDW: 14.9 % (ref 11.5–15.5)
WBC: 2.5 10*3/uL — ABNORMAL LOW (ref 4.0–10.5)
nRBC: 0 % (ref 0.0–0.2)

## 2023-10-03 LAB — COMPREHENSIVE METABOLIC PANEL WITH GFR
ALT: 16 U/L (ref 0–44)
AST: 21 U/L (ref 15–41)
Albumin: 3.6 g/dL (ref 3.5–5.0)
Alkaline Phosphatase: 61 U/L (ref 38–126)
Anion gap: 8 (ref 5–15)
BUN: 14 mg/dL (ref 6–20)
CO2: 26 mmol/L (ref 22–32)
Calcium: 8.8 mg/dL — ABNORMAL LOW (ref 8.9–10.3)
Chloride: 103 mmol/L (ref 98–111)
Creatinine, Ser: 0.87 mg/dL (ref 0.61–1.24)
GFR, Estimated: >60 mL/min (ref >60)
Glucose, Bld: 76 mg/dL (ref 70–99)
Potassium: 3.9 mmol/L (ref 3.5–5.1)
Sodium: 137 mmol/L (ref 135–145)
Total Bilirubin: 0.5 mg/dL (ref 0.0–1.2)
Total Protein: 7.6 g/dL (ref 6.5–8.1)

## 2023-10-03 LAB — LIPASE, BLOOD: Lipase: 35 U/L (ref 11–51)

## 2023-10-03 MED ORDER — ONDANSETRON 4 MG PO TBDP
4.0000 mg | ORAL_TABLET | Freq: Once | ORAL | Status: AC
  Administered 2023-10-03: 4 mg via ORAL
  Filled 2023-10-03: qty 1

## 2023-10-03 NOTE — ED Triage Notes (Signed)
 Pt complains of nausea and vomiting x 4 days. Has vomited 1 time each day. Also complains of rectal bleeding that is bright red x 4 days. Denies hemorrhoids. Hx of anal fissures.

## 2023-10-03 NOTE — Discharge Instructions (Addendum)
 You were seen in the ER today for concerns of rectal bleeding. Your labs were reassuring today with no abnormal findings seen and a stable hemoglobin. You have external hemorrhoids that may be causing some of your bleeding. Ensure you are staying hydrated and increasing your fiber intake. For any concerns of new or worsening symptoms, return to the ER.

## 2023-10-03 NOTE — ED Provider Notes (Signed)
 [place] EMERGENCY DEPARTMENT AT [facility] Provider Note   CSN: [id number] Arrival date & time: 10/03/23  1622     History Chief Complaint  Patient presents with   Emesis   Rectal Bleeding    Cory Russell is a 33 y.o. male.  Patient with history of HIV, hemorrhoids, rectal mass presents to the emergency department today with concerns of vomiting and rectal bleeding.  Reports has been ongoing for the last 4 days.  States that he has noted some bright red blood in the toilet as well as when he wipes.  Endorses some mild rectal discomfort with this as well as feelings of incomplete voiding at times.  Patient is sexually active but reports that he has been off of his HIV medications for several months choosing to pursue a "holistic route".  Not on blood thinners at this time.  Patient reports nausea and vomiting with 1 episode of vomiting daily for the last 4 days.   Emesis Rectal Bleeding Associated symptoms: vomiting        Home Medications Prior to Admission medications   Medication Sig Start Date End Date Taking? Authorizing Provider  ondansetron  (ZOFRAN -ODT) 4 MG disintegrating tablet Take 1 tablet (4 mg total) by mouth every 8 (eight) hours as needed for nausea or vomiting. 10/04/23  Yes Iolanda Folson A, PA-C  bictegravir-emtricitabine -tenofovir  AF (BIKTARVY ) 50-200-25 MG TABS tablet Take 1 tablet by mouth daily. 04/12/23   Liane Redman, MD  mirtazapine  (REMERON ) 15 MG tablet Take 1 tablet (15 mg total) by mouth at bedtime. 04/12/23   Liane Redman, MD  ondansetron  (ZOFRAN ) 4 MG tablet Take 1 tablet (4 mg total) by mouth daily as needed for nausea or vomiting. Take 30 minutes prior to medication 01/04/23   Lina Render, MD  sulfamethoxazole -trimethoprim  (BACTRIM  DS) 800-160 MG tablet Take 1 tablet by mouth 3 (three) times a week. 01/20/23   Liane Redman, MD  valproic  acid (DEPAKENE ) 250 MG/5ML solution Take 5 mLs (250 mg total) by mouth 4 (four) times daily.  04/12/23   Liane Redman, MD  zolpidem  (AMBIEN ) 5 MG tablet Take 1 tablet (5 mg total) by mouth at bedtime as needed for sleep. 01/19/23   Liane Redman, MD      Allergies    Patient has no known allergies.    Review of Systems   Review of Systems  Gastrointestinal:  Positive for hematochezia and vomiting.  All other systems reviewed and are negative.   Physical Exam Updated Vital Signs BP 113/76   Pulse 69   Temp 98.9 F (37.2 C) (Oral)   Resp 17   Ht 5\' 7"  (1.702 m)   Wt 54.4 kg   SpO2 100%   BMI 18.79 kg/m  Physical Exam Vitals and nursing note reviewed.  Constitutional:      General: He is not in acute distress.    Appearance: He is well-developed.  HENT:     Head: Normocephalic and atraumatic.  Eyes:     Conjunctiva/sclera: Conjunctivae normal.  Cardiovascular:     Rate and Rhythm: Normal rate and regular rhythm.     Heart sounds: No murmur heard. Pulmonary:     Effort: Pulmonary effort is normal. No respiratory distress.     Breath sounds: Normal breath sounds.  Abdominal:     Palpations: Abdomen is soft.     Tenderness: There is no abdominal tenderness.  Genitourinary:    Comments: Rectal exam reveals 2 external hemorrhoids present at the 9 and 11:00  positions.  No obvious thrombosed hemorrhoid.  No appreciable anal fissure.  No rectal bleeding present. Musculoskeletal:        General: No swelling.     Cervical back: Neck supple.  Skin:    General: Skin is warm and dry.     Capillary Refill: Capillary refill takes less than 2 seconds.  Neurological:     Mental Status: He is alert.  Psychiatric:        Mood and Affect: Mood normal.     ED Results / Procedures / Treatments   Labs (all labs ordered are listed, but only abnormal results are displayed) Labs Reviewed  COMPREHENSIVE METABOLIC PANEL WITH GFR - Abnormal; Notable for the following components:      Result Value   Calcium 8.8 (*)    All other components within normal limits  CBC -  Abnormal; Notable for the following components:   WBC 2.5 (*)    RBC 3.74 (*)    Hemoglobin 10.8 (*)    HCT 32.9 (*)    All other components within normal limits  LIPASE, BLOOD  URINALYSIS, ROUTINE W REFLEX MICROSCOPIC    EKG None  Radiology No results found.  Procedures Procedures    Medications Ordered in ED Medications  ondansetron  (ZOFRAN -ODT) disintegrating tablet 4 mg (4 mg Oral Given 10/03/23 2014)    ED Course/ Medical Decision Making/ A&P                                 Medical Decision Making Amount and/or Complexity of Data Reviewed Labs: ordered.  Risk Prescription drug management.   This patient presents to the ED for concern of rectal bleeding, vomiting.  Differential diagnosis includes GI bleed, hemorrhoids, anal fissure, Crohn's disease, ulcerative colitis   Lab Tests:  I Ordered, and personally interpreted labs.  The pertinent results include: CBC shows a stable hemoglobin at 10.8, CMP unremarkable, lipase negative, UA negative   Medicines ordered and prescription drug management:  I ordered medication including Zofran  for nausea Reevaluation of the patient after these medicines showed that the patient improved I have reviewed the patients home medicines and have made adjustments as needed   Problem List / ED Course:  Patient with past history significant for HIV presents to the emergency department today with concerns of rectal bleeding and vomiting.  Reports he has been having nausea vomiting for the last 4 days with an episode vomiting daily.  Unclear source of his vomiting.  States he is also been having some rectal bleeding with bright red blood per rectum on toilet and toilet bowl.  Not on blood thinners.  Does have a history of anal fissures denies any history of hemorrhoids.  Has previously been seen by general surgery for similar concerns.  Denies any feelings of generalized weakness, fatigue, or syncope. Physical exam is reassuring  abnormal findings in the abdomen, and no abnormal heart or lung sounds.  Rectal exam reveals hemorrhoids present at the 9 and 11:00 positions.  No active bleeding at this time.  The hemorrhoids are mildly tender to examination but not obviously thrombosed. Advised patient that given reassuring labs with a stable hemoglobin, low risk for complications of a GI bleed.  I do think that patient would benefit from outpatient GI follow-up for further evaluation.  Also did advise that given history of hemorrhoids, patient would benefit from repeat evaluation by general surgery although patient is somewhat apprehensive for  this.  Patient was hoping for admission today as he does not currently live with in [place] but given otherwise stable hemoglobin, do not feel the patient requires admission at this time.  Nausea has been well-controlled with Zofran  patient has not had any further episodes of vomiting.  Will discharge patient home with a course of Zofran  ODT for continued nausea management.  Discussed return precautions such as worsening rectal bleeding, feelings of weakness, dizziness, or lightheadedness.  Strongly advised patient to reach out to infectious disease once again to restart on his antiviral therapy for HIV.  Patient agreeable with this plan.  No other acute concerns at this time and patient otherwise stable for outpatient follow-up and discharged home.   Social Determinants of Health:  History of HIV not currently on antiviral therapy  Final Clinical Impression(s) / ED Diagnoses Final diagnoses:  Rectal bleeding  External hemorrhoid    Rx / DC Orders ED Discharge Orders          Ordered    ondansetron  (ZOFRAN -ODT) 4 MG disintegrating tablet  Every 8 hours PRN        10/04/23 0034              Geary Rufo A, PA-C 10/04/23 0036    Lind Repine, MD 10/04/23 2033

## 2023-10-03 NOTE — Telephone Encounter (Signed)
 Patient called stating that he's having vomiting, blood in stool, fever off and on, also has lost 6-7 lbs. Patient has been off Biktarvy  x 3-4 months.   We do not have currently have any availability at our office. Advised patient to go to ED to be evaluated and we also have an on call provider at [facility].   Will also inform Dr.Snider.    Cory Russell, CMA

## 2023-10-04 MED ORDER — ONDANSETRON 4 MG PO TBDP: 4.0000 mg | ORAL_TABLET | Freq: Three times a day (TID) | ORAL | 0 refills | Status: DC | PRN

## 2023-11-02 ENCOUNTER — Ambulatory Visit: Admitting: Internal Medicine

## 2023-11-07 ENCOUNTER — Encounter (HOSPITAL_COMMUNITY): Payer: Self-pay

## 2023-11-08 ENCOUNTER — Inpatient Hospital Stay (HOSPITAL_COMMUNITY)

## 2023-11-08 ENCOUNTER — Other Ambulatory Visit (HOSPITAL_COMMUNITY): Payer: Self-pay

## 2023-11-08 ENCOUNTER — Inpatient Hospital Stay (HOSPITAL_COMMUNITY)
Admission: EM | Admit: 2023-11-08 | Discharge: 2024-01-05 | DRG: 004 | Disposition: E | Source: Other Acute Inpatient Hospital | Attending: Internal Medicine | Admitting: Internal Medicine

## 2023-11-08 ENCOUNTER — Observation Stay (HOSPITAL_COMMUNITY)

## 2023-11-08 ENCOUNTER — Encounter (HOSPITAL_COMMUNITY): Payer: Self-pay | Admitting: Internal Medicine

## 2023-11-08 DIAGNOSIS — R509 Fever, unspecified: Secondary | ICD-10-CM | POA: Diagnosis not present

## 2023-11-08 DIAGNOSIS — J15212 Pneumonia due to Methicillin resistant Staphylococcus aureus: Secondary | ICD-10-CM | POA: Diagnosis not present

## 2023-11-08 DIAGNOSIS — B250 Cytomegaloviral pneumonitis: Secondary | ICD-10-CM | POA: Diagnosis not present

## 2023-11-08 DIAGNOSIS — J939 Pneumothorax, unspecified: Secondary | ICD-10-CM | POA: Diagnosis not present

## 2023-11-08 DIAGNOSIS — E875 Hyperkalemia: Secondary | ICD-10-CM | POA: Diagnosis not present

## 2023-11-08 DIAGNOSIS — R609 Edema, unspecified: Secondary | ICD-10-CM | POA: Diagnosis not present

## 2023-11-08 DIAGNOSIS — T426X6A Underdosing of other antiepileptic and sedative-hypnotic drugs, initial encounter: Secondary | ICD-10-CM | POA: Diagnosis present

## 2023-11-08 DIAGNOSIS — I82613 Acute embolism and thrombosis of superficial veins of upper extremity, bilateral: Secondary | ICD-10-CM | POA: Diagnosis not present

## 2023-11-08 DIAGNOSIS — I10 Essential (primary) hypertension: Secondary | ICD-10-CM | POA: Diagnosis present

## 2023-11-08 DIAGNOSIS — B258 Other cytomegaloviral diseases: Secondary | ICD-10-CM | POA: Diagnosis present

## 2023-11-08 DIAGNOSIS — D893 Immune reconstitution syndrome: Secondary | ICD-10-CM

## 2023-11-08 DIAGNOSIS — A4101 Sepsis due to Methicillin susceptible Staphylococcus aureus: Secondary | ICD-10-CM | POA: Diagnosis not present

## 2023-11-08 DIAGNOSIS — F419 Anxiety disorder, unspecified: Secondary | ICD-10-CM | POA: Diagnosis not present

## 2023-11-08 DIAGNOSIS — J80 Acute respiratory distress syndrome: Secondary | ICD-10-CM | POA: Diagnosis present

## 2023-11-08 DIAGNOSIS — J159 Unspecified bacterial pneumonia: Secondary | ICD-10-CM

## 2023-11-08 DIAGNOSIS — E43 Unspecified severe protein-calorie malnutrition: Secondary | ICD-10-CM | POA: Diagnosis present

## 2023-11-08 DIAGNOSIS — G9341 Metabolic encephalopathy: Secondary | ICD-10-CM | POA: Diagnosis not present

## 2023-11-08 DIAGNOSIS — R569 Unspecified convulsions: Principal | ICD-10-CM

## 2023-11-08 DIAGNOSIS — J930 Spontaneous tension pneumothorax: Secondary | ICD-10-CM | POA: Diagnosis not present

## 2023-11-08 DIAGNOSIS — A630 Anogenital (venereal) warts: Secondary | ICD-10-CM | POA: Diagnosis present

## 2023-11-08 DIAGNOSIS — R64 Cachexia: Secondary | ICD-10-CM | POA: Diagnosis not present

## 2023-11-08 DIAGNOSIS — E870 Hyperosmolality and hypernatremia: Secondary | ICD-10-CM | POA: Diagnosis not present

## 2023-11-08 DIAGNOSIS — D638 Anemia in other chronic diseases classified elsewhere: Secondary | ICD-10-CM | POA: Diagnosis not present

## 2023-11-08 DIAGNOSIS — J15211 Pneumonia due to Methicillin susceptible Staphylococcus aureus: Secondary | ICD-10-CM | POA: Diagnosis not present

## 2023-11-08 DIAGNOSIS — A419 Sepsis, unspecified organism: Secondary | ICD-10-CM | POA: Diagnosis present

## 2023-11-08 DIAGNOSIS — R0902 Hypoxemia: Secondary | ICD-10-CM

## 2023-11-08 DIAGNOSIS — A539 Syphilis, unspecified: Secondary | ICD-10-CM | POA: Diagnosis present

## 2023-11-08 DIAGNOSIS — Z681 Body mass index (BMI) 19 or less, adult: Secondary | ICD-10-CM | POA: Diagnosis not present

## 2023-11-08 DIAGNOSIS — J9601 Acute respiratory failure with hypoxia: Secondary | ICD-10-CM | POA: Diagnosis not present

## 2023-11-08 DIAGNOSIS — J181 Lobar pneumonia, unspecified organism: Secondary | ICD-10-CM

## 2023-11-08 DIAGNOSIS — Y848 Other medical procedures as the cause of abnormal reaction of the patient, or of later complication, without mention of misadventure at the time of the procedure: Secondary | ICD-10-CM | POA: Diagnosis not present

## 2023-11-08 DIAGNOSIS — B259 Cytomegaloviral disease, unspecified: Secondary | ICD-10-CM | POA: Diagnosis not present

## 2023-11-08 DIAGNOSIS — F32A Depression, unspecified: Secondary | ICD-10-CM | POA: Diagnosis present

## 2023-11-08 DIAGNOSIS — B9562 Methicillin resistant Staphylococcus aureus infection as the cause of diseases classified elsewhere: Secondary | ICD-10-CM | POA: Diagnosis not present

## 2023-11-08 DIAGNOSIS — E88A Wasting disease (syndrome) due to underlying condition: Secondary | ICD-10-CM | POA: Diagnosis present

## 2023-11-08 DIAGNOSIS — B59 Pneumocystosis: Secondary | ICD-10-CM | POA: Diagnosis present

## 2023-11-08 DIAGNOSIS — R7303 Prediabetes: Secondary | ICD-10-CM | POA: Diagnosis present

## 2023-11-08 DIAGNOSIS — G40909 Epilepsy, unspecified, not intractable, without status epilepticus: Secondary | ICD-10-CM | POA: Diagnosis present

## 2023-11-08 DIAGNOSIS — Z515 Encounter for palliative care: Secondary | ICD-10-CM

## 2023-11-08 DIAGNOSIS — J982 Interstitial emphysema: Secondary | ICD-10-CM | POA: Diagnosis not present

## 2023-11-08 DIAGNOSIS — B20 Human immunodeficiency virus [HIV] disease: Secondary | ICD-10-CM | POA: Diagnosis present

## 2023-11-08 DIAGNOSIS — N179 Acute kidney failure, unspecified: Secondary | ICD-10-CM

## 2023-11-08 DIAGNOSIS — A4189 Other specified sepsis: Secondary | ICD-10-CM | POA: Diagnosis present

## 2023-11-08 DIAGNOSIS — E8729 Other acidosis: Secondary | ICD-10-CM | POA: Diagnosis not present

## 2023-11-08 DIAGNOSIS — J95851 Ventilator associated pneumonia: Secondary | ICD-10-CM | POA: Diagnosis not present

## 2023-11-08 DIAGNOSIS — R6520 Severe sepsis without septic shock: Secondary | ICD-10-CM | POA: Diagnosis not present

## 2023-11-08 DIAGNOSIS — D649 Anemia, unspecified: Secondary | ICD-10-CM | POA: Diagnosis not present

## 2023-11-08 DIAGNOSIS — A4102 Sepsis due to Methicillin resistant Staphylococcus aureus: Secondary | ICD-10-CM | POA: Diagnosis not present

## 2023-11-08 DIAGNOSIS — D72819 Decreased white blood cell count, unspecified: Secondary | ICD-10-CM | POA: Diagnosis not present

## 2023-11-08 DIAGNOSIS — R54 Age-related physical debility: Secondary | ICD-10-CM | POA: Diagnosis not present

## 2023-11-08 DIAGNOSIS — N170 Acute kidney failure with tubular necrosis: Secondary | ICD-10-CM | POA: Diagnosis not present

## 2023-11-08 DIAGNOSIS — Z9911 Dependence on respirator [ventilator] status: Secondary | ICD-10-CM | POA: Diagnosis not present

## 2023-11-08 DIAGNOSIS — N19 Unspecified kidney failure: Secondary | ICD-10-CM | POA: Diagnosis not present

## 2023-11-08 DIAGNOSIS — R739 Hyperglycemia, unspecified: Secondary | ICD-10-CM | POA: Diagnosis not present

## 2023-11-08 DIAGNOSIS — Z5329 Procedure and treatment not carried out because of patient's decision for other reasons: Secondary | ICD-10-CM | POA: Diagnosis not present

## 2023-11-08 DIAGNOSIS — R638 Other symptoms and signs concerning food and fluid intake: Secondary | ICD-10-CM | POA: Diagnosis not present

## 2023-11-08 DIAGNOSIS — E119 Type 2 diabetes mellitus without complications: Secondary | ICD-10-CM | POA: Diagnosis not present

## 2023-11-08 DIAGNOSIS — Z91128 Patient's intentional underdosing of medication regimen for other reason: Secondary | ICD-10-CM

## 2023-11-08 DIAGNOSIS — T375X6A Underdosing of antiviral drugs, initial encounter: Secondary | ICD-10-CM | POA: Diagnosis present

## 2023-11-08 DIAGNOSIS — E871 Hypo-osmolality and hyponatremia: Secondary | ICD-10-CM | POA: Diagnosis present

## 2023-11-08 DIAGNOSIS — J189 Pneumonia, unspecified organism: Secondary | ICD-10-CM | POA: Diagnosis present

## 2023-11-08 DIAGNOSIS — D6489 Other specified anemias: Secondary | ICD-10-CM | POA: Diagnosis present

## 2023-11-08 DIAGNOSIS — Z7189 Other specified counseling: Secondary | ICD-10-CM | POA: Diagnosis not present

## 2023-11-08 DIAGNOSIS — G9340 Encephalopathy, unspecified: Secondary | ICD-10-CM | POA: Diagnosis not present

## 2023-11-08 DIAGNOSIS — L988 Other specified disorders of the skin and subcutaneous tissue: Secondary | ICD-10-CM

## 2023-11-08 DIAGNOSIS — Z79899 Other long term (current) drug therapy: Secondary | ICD-10-CM

## 2023-11-08 DIAGNOSIS — D72829 Elevated white blood cell count, unspecified: Secondary | ICD-10-CM | POA: Diagnosis not present

## 2023-11-08 DIAGNOSIS — Z930 Tracheostomy status: Secondary | ICD-10-CM | POA: Diagnosis not present

## 2023-11-08 DIAGNOSIS — Z66 Do not resuscitate: Secondary | ICD-10-CM | POA: Diagnosis present

## 2023-11-08 DIAGNOSIS — R000 Tachycardia, unspecified: Secondary | ICD-10-CM | POA: Diagnosis not present

## 2023-11-08 DIAGNOSIS — J9602 Acute respiratory failure with hypercapnia: Secondary | ICD-10-CM | POA: Diagnosis not present

## 2023-11-08 DIAGNOSIS — I2602 Saddle embolus of pulmonary artery with acute cor pulmonale: Secondary | ICD-10-CM | POA: Diagnosis not present

## 2023-11-08 DIAGNOSIS — R4589 Other symptoms and signs involving emotional state: Secondary | ICD-10-CM | POA: Diagnosis not present

## 2023-11-08 DIAGNOSIS — E876 Hypokalemia: Secondary | ICD-10-CM | POA: Diagnosis not present

## 2023-11-08 LAB — RESPIRATORY PANEL BY PCR

## 2023-11-08 LAB — PROCALCITONIN: Procalcitonin: <0.1 ng/mL

## 2023-11-08 LAB — CBC WITH DIFFERENTIAL/PLATELET
Abs Immature Granulocytes: 0.11 10*3/uL — ABNORMAL HIGH (ref 0.00–0.07)
Basophils Absolute: 0 10*3/uL (ref 0.0–0.1)
Basophils Relative: 0 %
Eosinophils Absolute: 0 10*3/uL (ref 0.0–0.5)
Eosinophils Relative: 0 %
HCT: 29.5 % — ABNORMAL LOW (ref 39.0–52.0)
Hemoglobin: 10.1 g/dL — ABNORMAL LOW (ref 13.0–17.0)
Immature Granulocytes: 2 %
Lymphocytes Relative: 9 %
Lymphs Abs: 0.6 10*3/uL — ABNORMAL LOW (ref 0.7–4.0)
MCH: 28.5 pg (ref 26.0–34.0)
MCHC: 34.2 g/dL (ref 30.0–36.0)
MCV: 83.1 fL (ref 80.0–100.0)
Monocytes Absolute: 0.6 10*3/uL (ref 0.1–1.0)
Monocytes Relative: 8 %
Neutro Abs: 5.6 10*3/uL (ref 1.7–7.7)
Neutrophils Relative %: 81 %
Platelets: 296 10*3/uL (ref 150–400)
RBC: 3.55 MIL/uL — ABNORMAL LOW (ref 4.22–5.81)
RDW: 13.1 % (ref 11.5–15.5)
WBC: 6.9 10*3/uL (ref 4.0–10.5)
nRBC: 0 % (ref 0.0–0.2)

## 2023-11-08 LAB — COMPREHENSIVE METABOLIC PANEL WITH GFR
ALT: 18 U/L (ref 0–44)
AST: 30 U/L (ref 15–41)
Albumin: 2.5 g/dL — ABNORMAL LOW (ref 3.5–5.0)
Alkaline Phosphatase: 42 U/L (ref 38–126)
Anion gap: 10 (ref 5–15)
BUN: 16 mg/dL (ref 6–20)
CO2: 25 mmol/L (ref 22–32)
Calcium: 8.9 mg/dL (ref 8.9–10.3)
Chloride: 96 mmol/L — ABNORMAL LOW (ref 98–111)
Creatinine, Ser: 0.83 mg/dL (ref 0.61–1.24)
GFR, Estimated: >60 mL/min (ref >60)
Glucose, Bld: 118 mg/dL — ABNORMAL HIGH (ref 70–99)
Potassium: 4.4 mmol/L (ref 3.5–5.1)
Sodium: 131 mmol/L — ABNORMAL LOW (ref 135–145)
Total Bilirubin: 0.5 mg/dL (ref 0.0–1.2)
Total Protein: 7.3 g/dL (ref 6.5–8.1)

## 2023-11-08 LAB — T-HELPER CELLS (CD4) COUNT (NOT AT ARMC)
CD4 % Helper T Cell: 6 % — ABNORMAL LOW (ref 33–65)
CD4 T Cell Abs: <35 /uL — ABNORMAL LOW (ref 400–1790)

## 2023-11-08 LAB — CRYPTOCOCCAL ANTIGEN: Crypto Ag: NEGATIVE

## 2023-11-08 LAB — PHOSPHORUS: Phosphorus: 2 mg/dL — ABNORMAL LOW (ref 2.5–4.6)

## 2023-11-08 LAB — LACTIC ACID, PLASMA
Lactic Acid, Venous: 1.2 mmol/L (ref 0.5–1.9)
Lactic Acid, Venous: 0.8 mmol/L (ref 0.5–1.9)

## 2023-11-08 LAB — MAGNESIUM: Magnesium: 1.7 mg/dL (ref 1.7–2.4)

## 2023-11-08 LAB — LACTATE DEHYDROGENASE: LDH: 305 U/L — ABNORMAL HIGH (ref 98–192)

## 2023-11-08 MED ORDER — PREDNISONE 20 MG PO TABS: 20.0000 mg | ORAL_TABLET | Freq: Every day | ORAL | Status: DC

## 2023-11-08 MED ORDER — SODIUM CHLORIDE 0.9 % IV SOLN: INTRAVENOUS | Status: AC

## 2023-11-08 MED ORDER — NYSTATIN 100000 UNIT/ML MT SUSP
5.0000 mL | Freq: Four times a day (QID) | OROMUCOSAL | Status: AC
  Administered 2023-11-08 – 2023-11-22 (×57): 500000 [IU] via ORAL
  Filled 2023-11-08 (×55): qty 5

## 2023-11-08 MED ORDER — GUAIFENESIN ER 600 MG PO TB12
600.0000 mg | ORAL_TABLET | Freq: Two times a day (BID) | ORAL | Status: AC
  Administered 2023-11-08 – 2023-11-10 (×6): 600 mg via ORAL
  Filled 2023-11-08 (×6): qty 1

## 2023-11-08 MED ORDER — SODIUM CHLORIDE 0.9 % IV SOLN: 3.0000 g | Freq: Two times a day (BID) | INTRAVENOUS | Status: DC

## 2023-11-08 MED ORDER — DARUNAVIR-COBICISTAT 800-150 MG PO TABS
1.0000 | ORAL_TABLET | Freq: Every day | ORAL | Status: DC
  Administered 2023-11-08 – 2023-12-10 (×32): 1 via ORAL
  Filled 2023-11-08 (×34): qty 1

## 2023-11-08 MED ORDER — PREDNISONE 20 MG PO TABS
40.0000 mg | ORAL_TABLET | Freq: Every day | ORAL | Status: AC
  Administered 2023-11-13 – 2023-11-17 (×5): 40 mg via ORAL
  Filled 2023-11-08 (×6): qty 2

## 2023-11-08 MED ORDER — MIRTAZAPINE 15 MG PO TABS
15.0000 mg | ORAL_TABLET | Freq: Every day | ORAL | Status: DC
  Administered 2023-11-08 – 2023-11-16 (×9): 15 mg via ORAL
  Filled 2023-11-08 (×9): qty 1

## 2023-11-08 MED ORDER — GADOBUTROL 1 MMOL/ML IV SOLN
5.0000 mL | Freq: Once | INTRAVENOUS | Status: AC | PRN
  Administered 2023-11-08: 5 mL via INTRAVENOUS

## 2023-11-08 MED ORDER — MIRTAZAPINE 15 MG PO TABS
15.0000 mg | ORAL_TABLET | Freq: Once | ORAL | Status: AC
  Administered 2023-11-08: 15 mg via ORAL
  Filled 2023-11-08: qty 1

## 2023-11-08 MED ORDER — LORAZEPAM 1 MG PO TABS
1.0000 mg | ORAL_TABLET | Freq: Four times a day (QID) | ORAL | Status: DC | PRN
  Administered 2023-11-08 – 2023-11-21 (×15): 1 mg via ORAL
  Filled 2023-11-08 (×17): qty 1

## 2023-11-08 MED ORDER — PREDNISONE 20 MG PO TABS
40.0000 mg | ORAL_TABLET | Freq: Two times a day (BID) | ORAL | Status: AC
  Administered 2023-11-08 – 2023-11-12 (×10): 40 mg via ORAL
  Filled 2023-11-08 (×10): qty 2

## 2023-11-08 MED ORDER — SODIUM CHLORIDE 0.9 % IV SOLN
100.0000 mg | Freq: Two times a day (BID) | INTRAVENOUS | Status: DC
  Administered 2023-11-08 – 2023-11-12 (×9): 100 mg via INTRAVENOUS
  Filled 2023-11-08 (×10): qty 100

## 2023-11-08 MED ORDER — SODIUM CHLORIDE 0.9 % IV SOLN
3.0000 g | Freq: Four times a day (QID) | INTRAVENOUS | Status: DC
  Administered 2023-11-08 – 2023-11-09 (×6): 3 g via INTRAVENOUS
  Filled 2023-11-08 (×6): qty 8

## 2023-11-08 MED ORDER — LORAZEPAM 2 MG/ML IJ SOLN: 2.0000 mg | Freq: Four times a day (QID) | INTRAMUSCULAR | Status: DC | PRN

## 2023-11-08 MED ORDER — ACETAMINOPHEN 325 MG PO TABS
650.0000 mg | ORAL_TABLET | Freq: Four times a day (QID) | ORAL | Status: DC | PRN
  Administered 2023-11-08 (×3): 650 mg via ORAL
  Filled 2023-11-08 (×3): qty 2

## 2023-11-08 MED ORDER — IPRATROPIUM-ALBUTEROL 0.5-2.5 (3) MG/3ML IN SOLN
3.0000 mL | Freq: Four times a day (QID) | RESPIRATORY_TRACT | Status: DC | PRN
Start: 2023-11-08 — End: 2023-11-23
  Administered 2023-11-08 – 2023-11-22 (×28): 3 mL via RESPIRATORY_TRACT
  Filled 2023-11-08 (×32): qty 3

## 2023-11-08 MED ORDER — SULFAMETHOXAZOLE-TRIMETHOPRIM 400-80 MG/5ML IV SOLN
15.0000 mg/kg/d | Freq: Three times a day (TID) | INTRAVENOUS | Status: DC
  Administered 2023-11-08 – 2023-11-16 (×25): 244.96 mg via INTRAVENOUS
  Filled 2023-11-08 (×21): qty 15.31
  Filled 2023-11-08: qty 10
  Filled 2023-11-08 (×5): qty 15.31

## 2023-11-08 MED ORDER — ENOXAPARIN SODIUM 40 MG/0.4ML IJ SOSY
40.0000 mg | PREFILLED_SYRINGE | Freq: Every day | INTRAMUSCULAR | Status: DC
  Administered 2023-11-09: 40 mg via SUBCUTANEOUS
  Filled 2023-11-08 (×4): qty 0.4

## 2023-11-08 MED ORDER — ENSURE PLUS HIGH PROTEIN PO LIQD
237.0000 mL | Freq: Three times a day (TID) | ORAL | Status: DC
  Administered 2023-11-08 – 2023-11-25 (×33): 237 mL via ORAL

## 2023-11-08 MED ORDER — SODIUM CHLORIDE 3 % IN NEBU
4.0000 mL | INHALATION_SOLUTION | Freq: Two times a day (BID) | RESPIRATORY_TRACT | Status: AC
  Administered 2023-11-08 – 2023-11-10 (×4): 4 mL via RESPIRATORY_TRACT
  Filled 2023-11-08 (×6): qty 4

## 2023-11-08 MED ORDER — VALPROATE SODIUM 100 MG/ML IV SOLN
500.0000 mg | Freq: Two times a day (BID) | INTRAVENOUS | Status: DC
  Administered 2023-11-08 – 2023-11-14 (×13): 500 mg via INTRAVENOUS
  Filled 2023-11-08: qty 500
  Filled 2023-11-08 (×4): qty 5
  Filled 2023-11-08: qty 500
  Filled 2023-11-08 (×9): qty 5

## 2023-11-08 MED ORDER — PROCHLORPERAZINE EDISYLATE 10 MG/2ML IJ SOLN: 5.0000 mg | Freq: Four times a day (QID) | INTRAMUSCULAR | Status: DC | PRN

## 2023-11-08 MED ORDER — BICTEGRAVIR-EMTRICITAB-TENOFOV 50-200-25 MG PO TABS
1.0000 | ORAL_TABLET | Freq: Every day | ORAL | Status: DC
  Administered 2023-11-08 – 2023-12-15 (×37): 1 via ORAL
  Filled 2023-11-08 (×41): qty 1

## 2023-11-08 MED ORDER — MELATONIN 5 MG PO TABS
5.0000 mg | ORAL_TABLET | Freq: Every evening | ORAL | Status: DC | PRN
  Administered 2023-11-08: 5 mg via ORAL
  Filled 2023-11-08: qty 1

## 2023-11-08 MED ORDER — POLYETHYLENE GLYCOL 3350 17 G PO PACK
17.0000 g | PACK | Freq: Every day | ORAL | Status: DC | PRN
  Administered 2023-11-10: 17 g via ORAL
  Filled 2023-11-08: qty 1

## 2023-11-08 MED ORDER — IPRATROPIUM-ALBUTEROL 0.5-2.5 (3) MG/3ML IN SOLN
3.0000 mL | RESPIRATORY_TRACT | Status: AC
  Administered 2023-11-08: 3 mL via RESPIRATORY_TRACT
  Filled 2023-11-08: qty 3

## 2023-11-08 NOTE — Progress Notes (Signed)
 No charge note  Patient seen and examined this morning, admitted overnight, H&P reviewed and I agree with the assessment and plan.  33 year old male with HIV, seizure disorder on valproic  acid, HTN, chronic depression, nonadherent to medication regiment for the past few months due to personal reasons comes into the hospital with seizure-like activity, fever, chills and cough.  He was found to have pneumonia, hyponatremia and was admitted to the hospital, placed on antibiotics and antiretrovirals.  ID consulted  Cory Reinders M. [name], MD, PhD [facility]  Between 7 am - 7 pm you can contact me via Amion (for emergencies) or Securechat (non urgent matters).  I am not available 7 pm - 7 am, please contact night coverage MD/APP via Amion

## 2023-11-08 NOTE — Consult Note (Addendum)
 [facility]    Date of Admission:  11/08/2023   Total days of antibiotics 2  Reason for Consult: HIV, recurrent fever, and unresolved pneumonia    Referring Provider: Kathlen Para, MD  Assessment: Persistent RLL PNA in HIV patient with ART non adherence: high concern for PJP vs viral PNA in this patient given ART nonadherence, CD4 count < 50 and overall decompensation for the past 6 months. Other fungal etiologies not ruled out. Will follow up work up below to guide management. Seizure-like activity: initially thought to be in the setting of fever. No AEDs in the prior months. With severe immunosuppression and recent worsening migraines, will need to rule out CNS process.  Hx of syphilis: Asymptomatic. will repeat testing during this admission Severe protein-calorie malnutrition: Query if thrush if contributing. Likely in setting of severe HIV immunosuppression.  L hepatic lesion: noted on CT PE protocol study. No hepatic enzyme dysfunction. MRI liver recommended. Per primary team  Plan: Restart Prezcobix  and Biktarvy ; follow HIV RNA 1 quant Unasyn for CAP coverage and Bactrim  for PJP coverage Will add prednisone coverage Nystatin suspension for thrush Extended RVP ordered Follow up LDH, Legionella Ag, pneumocystis smear Follow up blood culture, fever curve, CBC with differential MRI brain without contrast Follow up RPR Will need discussions with neurology to switch to Keppra  to minimize interactions and help with adherence Dietitian consult MRI liver per primary team  Principal Problem:   Seizure-like activity (HCC)    bictegravir-emtricitabine -tenofovir  AF  1 tablet Oral Daily   darunavir -cobicistat   1 tablet Oral Q breakfast   enoxaparin  (LOVENOX ) injection  40 mg Subcutaneous Daily   guaiFENesin  600 mg Oral BID   mirtazapine   15 mg Oral QHS   nystatin  5 mL Oral QID   predniSONE  40 mg Oral BID WC   Followed by   Cory Russell ON 11/13/2023]  predniSONE  40 mg Oral Q breakfast   Followed by   Cory Russell ON 11/18/2023] predniSONE  20 mg Oral Q breakfast   sodium chloride  HYPERTONIC  4 mL Nebulization BID    HPI: Cory Russell is a 33 y.o. male with HIV, previously on ART, prior hx of syphilis, seizure disorder previously on valproic  acid, HTN, chronic depression admitted to the hospital in the setting of seizure-like activity  Timeline of current symptoms:  5/21 patient presented to [facility] practice for a week of productive cough, chest congestion. 2-view CXR normal at that time. Negative strep Ag, flu and COVID PCR. He was discharged with Amoxicillin  TID for 7 days and supportive OTC medications.   5/26 Presented to [facility] ED for worsening respiratory symptoms. No hypoxia or leukocytosis. CXR wnl. Negative COVID and influenza PCR panels. CT chest PE protocol with mild R lower lobe PNA. He was discharged with Augmentin and doxycycline course for 10 days, as well as Prednisone dose pack. He was initially unable to pick up these medications as he was unable to afford them; returned to ED on 5/27 and were finally filled.  6/3 Presented to [facility] ED for seizure-like activity. Negative U/A. Lactic acid wnl. WBC 6.2. Na 130. Normal hepatic function panel. CT head without contrast without acute findings. CXR one view with mild patchy pulmonary opacities scattered in both lungs. No lobar consolidation or effusion. Patient was restarted on valproic  acid, which patient has not taken for months. Patient was then directly admitted to [facility] and admitted to hospitalist service.  On admission to [facility],  patient had rales on exam, Tmax of 102.8, otherwise HDS. [name] reports that the main change to his medical history has been the cough and congestion since May 2025. Most recently, patient endorses high fevers, 20lb weight loss, and throbbing retro-orbital headaches. Has been having rigors and decrease appetite  and generalized weakness during out interview. Has not been by a PCP in a long time.   Prior to the seizure activity yesterday, he report no other episodes since earlier in 2024. He self discontinued Valproic  acid given frequency of dosing. Has been on Keppra  prior to valproic  acid in the past with some report in the EMR for agitation. Spot EEG during this admission within normal limits without epileptiform discharges.   Regarding his HIV history, he is known to the [facility] infectious disease service. Unfortunately, questions regarding this were not performed in the room as family members present. However, according to dispense history, he has been off of ART since 06/2023. Last  CD4 count 124 7 months ago. No PJP prophylaxis.   Lab Results  Component Value Date   CD4TABS <35 (L) 11/08/2023   CD4TABS 124 (L) 04/12/2023   CD4TABS 112 (L) 02/28/2023       Review of Systems: Per HPI  Past Medical History:  Diagnosis Date   Anal pain    w/ anal mass   Cocaine abuse (HCC)    Depression    GERD (gastroesophageal reflux disease)    HIV (human immunodeficiency virus infection) (HCC)    followed by ID-- dr comer   Hypertension    no meds , followed by dr comer    Social History   Tobacco Use   Smoking status: Never   Smokeless tobacco: Never  Substance Use Topics   Alcohol use: Not Currently   Drug use: Yes    Types: Marijuana, Cocaine    Comment: 7-102-23 last used cocaine " a while  ago" per pt, smokes marijuana daily    Family History  Problem Relation Age of Onset   Diabetes Mother    Hypertension Father    Prostate cancer Father    Alcoholism Brother    Hypertension Brother    No Known Allergies  OBJECTIVE: Blood pressure (!) 101/58, pulse 99, temperature 99.2 F (37.3 C), temperature source Oral, resp. rate 20, height 5\' 7"  (1.702 m), weight 49 kg, SpO2 97%.  General: Pleasant, cachectic young man sitting in bed in NAD Head: Normocephalic. Atraumatic. Thrush diffusely  present on tongue surface. Unable to visualize posterior oropharynx CV: RRR. No murmurs, rubs, or gallops. No LE edema Pulmonary: Rales through out. On RA. Cough paroxysms present. No wheezing or rales. Abdominal: Soft, nontender, nondistended. Normal bowel sounds. Extremities: Palpable radial and DP pulses. Normal ROM. Skin: Warm and dry. No obvious rash or lesions. No splinter hemorrhages Neuro: A&Ox3. Moves all extremities. Normal sensation. No focal deficit. Psych: Normal mood and affect   Lab Results Lab Results  Component Value Date   WBC 6.9 11/08/2023   HGB 10.1 (L) 11/08/2023   HCT 29.5 (L) 11/08/2023   MCV 83.1 11/08/2023   PLT 296 11/08/2023    Lab Results  Component Value Date   CREATININE 0.83 11/08/2023   BUN 16 11/08/2023   NA 131 (L) 11/08/2023   K 4.4 11/08/2023   CL 96 (L) 11/08/2023   CO2 25 11/08/2023    Lab Results  Component Value Date   ALT 18 11/08/2023   AST 30 11/08/2023   ALKPHOS 42 11/08/2023  BILITOT 0.5 11/08/2023     Microbiology: No results found for this or any previous visit (from the past 240 hours).  Cathey Clunes, MD [facility]   11/08/2023 12:54 PM

## 2023-11-08 NOTE — Hospital Course (Signed)
 Fever and coughing

## 2023-11-08 NOTE — Procedures (Signed)
 Patient Name: Cory Russell  MRN: [id number]  Epilepsy Attending: Arleene Lack  Referring Physician/Provider: Bary Boss, DO  Date: 11/08/2023 Duration: 23.49 mins  Patient history: 33yo M with seizure like activity. EEG to evaluate for seizure.  Level of alertness: Awake,  asleep  AEDs during EEG study: VPA  Technical aspects: This EEG study was done with scalp electrodes positioned according to the 10-20 International system of electrode placement. Electrical activity was reviewed with band pass filter of 1-70Hz , sensitivity of 7 uV/mm, display speed of 58mm/sec with a 60Hz  notched filter applied as appropriate. EEG data were recorded continuously and digitally stored.  Video monitoring was available and reviewed as appropriate.  Description: The posterior dominant rhythm consists of 9-10 Hz activity of moderate voltage (25-35 uV) seen predominantly in posterior head regions, symmetric and reactive to eye opening and eye closing. Sleep was characterized by vertex waves, sleep spindles (12 to 14 Hz), maximal frontocentral region.  Hyperventilation and photic stimulation were not performed.     IMPRESSION: This study is within normal limits. No seizures or epileptiform discharges were seen throughout the recording.  A normal interictal EEG does not exclude the diagnosis of epilepsy.  Cory Russell Cory Russell

## 2023-11-08 NOTE — Evaluation (Signed)
 Occupational Therapy Evaluation Patient Details Name: Cory Russell MRN: [id number] DOB: 09/16/1990 Today's Date: 11/08/2023   History of Present Illness   Pt is a 33 yr old male who presented 11/08/23 with seizure-like activity. EEG pending. Pt was recently was dx with right lobe PNA. PMH: HIV, seizure disorder, HTN, rectal bleeding, depression.     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 He was able to complete tasks with supervision level at this time but was limited due to o2 and coughing. Pt was educated about IS in session and energy conservation strategies in session. Will continue to follow with Acute Occupational Therapy.      If plan is discharge home, recommend the following:   Assistance with cooking/housework;Assist for transportation     Functional Status Assessment   Patient has had a recent decline in their functional status and demonstrates the ability to make significant improvements in function in a reasonable and predictable amount of time.     Equipment Recommendations   Tub/shower seat     Recommendations for Other Services         Precautions/Restrictions   Precautions Precautions: Other (comment) (watch o2) Recall of Precautions/Restrictions: Intact Restrictions Weight Bearing Restrictions Per Provider Order: No     Mobility Bed Mobility Overal bed mobility: Modified Independent                  Transfers Overall transfer level: Modified independent                 General transfer comment: able to maintain steady static standing without UE assist      Balance Overall balance assessment: No apparent balance deficits (not formally assessed), Needs assistance   Sitting balance-Leahy Scale: Normal       Standing balance-Leahy Scale: Good                             ADL either performed or assessed with clinical judgement   ADL Overall ADL's : Needs  assistance/impaired Eating/Feeding: Independent;Sitting   Grooming: Wash/dry hands;Supervision/safety;Standing   Upper Body Bathing: Set up;Sitting   Lower Body Bathing: Set up;Sitting/lateral leans;Sit to/from stand   Upper Body Dressing : Set up;Sitting   Lower Body Dressing: Set up;Sitting/lateral leans   Toilet Transfer: Supervision/safety   Toileting- Clothing Manipulation and Hygiene: Supervision/safety;Contact guard assist       Functional mobility during ADLs: Supervision/safety       Vision         Perception         Praxis         Pertinent Vitals/Pain Pain Assessment Pain Assessment: 0-10 Pain Score: 8  Pain Location: chest, also up to 8/10 medial to each scapula, L worse than R. Pain Descriptors / Indicators: Aching, Discomfort, Grimacing, Burning, Stabbing Pain Intervention(s): Limited activity within patient's tolerance, Monitored during session     Extremity/Trunk Assessment Upper Extremity Assessment Upper Extremity Assessment: Overall WFL for tasks assessed   Lower Extremity Assessment Lower Extremity Assessment: Defer to PT evaluation   Cervical / Trunk Assessment Cervical / Trunk Assessment: Normal   Communication Communication Communication: No apparent difficulties   Cognition Arousal: Alert Behavior During Therapy: Anxious Cognition: No apparent impairments                               Following commands:  Intact       Cueing  General Comments      At rest pt on RA talking on the phone. Pt then when ambulating destated down to 79% and was able to improve back to 90% within 2 mins   Exercises     Shoulder Instructions      Home Living Family/patient expects to be discharged to:: Private residence Living Arrangements: Parent Available Help at Discharge: Family Type of Home: House Home Access: Stairs to enter Secretary/administrator of Steps: 4 Entrance Stairs-Rails: Right;Left;Can reach both Home  Layout: One level     Bathroom Shower/Tub: Producer, television/film/video: Standard     Home Equipment: None          Prior Functioning/Environment Prior Level of Function : Independent/Modified Independent                    OT Problem List: Decreased activity tolerance;Impaired balance (sitting and/or standing);Decreased knowledge of use of DME or AE;Decreased safety awareness;Pain   OT Treatment/Interventions: Self-care/ADL training;Balance training;Patient/family education;DME and/or AE instruction      OT Goals(Current goals can be found in the care plan section)   Acute Rehab OT Goals Patient Stated Goal: to be better OT Goal Formulation: With patient Time For Goal Achievement: 11/22/23 Potential to Achieve Goals: Good ADL Goals Pt Will Perform Tub/Shower Transfer: with modified independence;with supervision;ambulating Additional ADL Goal #1: Pt will voice 3 energy conservation stratigies with the return to home Additional ADL Goal #2: Pt will be able to complete 2 ADL tasks in standing with stable o2 readings.   OT Frequency:  Min 2X/week    Co-evaluation PT/OT/SLP Co-Evaluation/Treatment: Yes Reason for Co-Treatment: Other (comment) (pain, frustrated, breathing) PT goals addressed during session: Mobility/safety with mobility OT goals addressed during session: ADL's and self-care      AM-PAC OT "6 Clicks" Daily Activity     Outcome Measure Help from another person eating meals?: None Help from another person taking care of personal grooming?: None Help from another person toileting, which includes using toliet, bedpan, or urinal?: A Little Help from another person bathing (including washing, rinsing, drying)?: A Little Help from another person to put on and taking off regular upper body clothing?: None Help from another person to put on and taking off regular lower body clothing?: None 6 Click Score: 22   End of Session Nurse Communication:  Mobility status  Activity Tolerance: Patient limited by fatigue;Patient limited by pain Patient left: in bed;with call bell/phone within reach;with family/visitor present  OT Visit Diagnosis: Unsteadiness on feet (R26.81);Pain Pain - Right/Left:  (general)                Time: 6578-4696 OT Time Calculation (min): 35 min Charges:  OT General Charges $OT Visit: 1 Visit OT Evaluation $OT Eval Low Complexity: 1 Low  Erving Heather OTR/L  Acute Rehab Services  [phone] office number   Stevphen Elders 11/08/2023, 12:56 PM

## 2023-11-08 NOTE — Evaluation (Signed)
 Physical Therapy Evaluation Patient Details Name: Cory Russell MRN: [id number] DOB: 06-07-1990 Today's Date: 11/08/2023  History of Present Illness  Pt is a 33 yr old male who presented 11/08/23 with seizure-like activity. EEG pending. Pt was recently was dx with right lobe PNA. PMH: HIV, seizure disorder, HTN, rectal bleeding, depression.  Clinical Impression  Pt admitted with/for seizure-like activity and pain in chest and upper back, having had Lobar PNA dx recently.  Pt was mobile at a S to mod I level overall, but was uncomfortable  at up to 8/10 chest /back pain.  Pt currently limited functionally due to the problems listed below.  (see problems list.)  Pt will benefit from PT to maximize function and safety to be able to get home safely with available assist .         If plan is discharge home, recommend the following: Assistance with cooking/housework;Assist for transportation   Can travel by private vehicle        Equipment Recommendations None recommended by PT  Recommendations for Other Services       Functional Status Assessment Patient has had a recent decline in their functional status and demonstrates the ability to make significant improvements in function in a reasonable and predictable amount of time.     Precautions / Restrictions Precautions Precautions: Other (comment) (monitor O2) Recall of Precautions/Restrictions: Intact      Mobility  Bed Mobility Overal bed mobility: Modified Independent                  Transfers Overall transfer level: Modified independent                 General transfer comment: able to maintain steady static standing without UE assist    Ambulation/Gait Ambulation/Gait assistance: Supervision Gait Distance (Feet): 60 Feet Assistive device: None, IV Pole Gait Pattern/deviations: Step-through pattern, Decreased stride length   Gait velocity interpretation: 1.31 - 2.62 ft/sec, indicative of limited community  ambulator   General Gait Details: generally steady, some drift with coughing spasms. During coughing "fits" he held to the IV pole appropriately.  Stairs            Wheelchair Mobility     Tilt Bed    Modified Rankin (Stroke Patients Only)       Balance Overall balance assessment: No apparent balance deficits (not formally assessed), Needs assistance   Sitting balance-Leahy Scale: Normal       Standing balance-Leahy Scale: Good                               Pertinent Vitals/Pain Pain Assessment Pain Assessment: 0-10 Pain Score: 8  Pain Location: chest, also up to 8/10 medial to each scapula, L worse than R. Pain Descriptors / Indicators: Aching, Discomfort, Grimacing, Burning, Stabbing Pain Intervention(s): Monitored during session, Limited activity within patient's tolerance    Home Living Family/patient expects to be discharged to:: Private residence Living Arrangements: Parent Available Help at Discharge: Family Type of Home: House Home Access: Stairs to enter Entrance Stairs-Rails: Right;Left;Can reach both Secretary/administrator of Steps: 4   Home Layout: One level Home Equipment: None      Prior Function Prior Level of Function : Independent/Modified Independent                     Extremity/Trunk Assessment   Upper Extremity Assessment Upper Extremity Assessment: Defer to OT evaluation  Lower Extremity Assessment Lower Extremity Assessment: Overall WFL for tasks assessed    Cervical / Trunk Assessment Cervical / Trunk Assessment: Normal  Communication   Communication Communication: No apparent difficulties    Cognition Arousal: Alert Behavior During Therapy: Anxious   PT - Cognitive impairments: No apparent impairments                         Following commands: Intact       Cueing       General Comments General comments (skin integrity, edema, etc.): At rest on RA, SpO2 remained in the 93%  range, HR 90's bpm.  With activity, Sats remained in lower 90's until pt had a significant coughing spell, then the sats dropped to low of 79% and progressively improved to 90% over 2 min.    Exercises     Assessment/Plan    PT Assessment Patient needs continued PT services  PT Problem List Decreased activity tolerance;Decreased mobility;Cardiopulmonary status limiting activity;Pain       PT Treatment Interventions Gait training;Stair training;Functional mobility training;Therapeutic activities;Balance training;Patient/family education    PT Goals (Current goals can be found in the Care Plan section)  Acute Rehab PT Goals Patient Stated Goal: home soon, chest and back pain tolerable <5/10 with mobility PT Goal Formulation: With patient Time For Goal Achievement: 11/22/23 Potential to Achieve Goals: Good    Frequency Min 3X/week     Co-evaluation PT/OT/SLP Co-Evaluation/Treatment: Yes Reason for Co-Treatment: Other (comment) (Pt in a lot of pain, willing to participate with therapies together.) PT goals addressed during session: Mobility/safety with mobility OT goals addressed during session: ADL's and self-care       AM-PAC PT "6 Clicks" Mobility  Outcome Measure Help needed turning from your back to your side while in a flat bed without using bedrails?: None Help needed moving from lying on your back to sitting on the side of a flat bed without using bedrails?: A Little Help needed moving to and from a bed to a chair (including a wheelchair)?: A Little Help needed standing up from a chair using your arms (e.g., wheelchair or bedside chair)?: A Little Help needed to walk in hospital room?: A Little Help needed climbing 3-5 steps with a railing? : A Little 6 Click Score: 19    End of Session   Activity Tolerance: Patient tolerated treatment well;Patient limited by pain;Patient limited by fatigue Patient left: in bed;with call bell/phone within reach;with family/visitor  present Nurse Communication: Mobility status PT Visit Diagnosis: Difficulty in walking, not elsewhere classified (R26.2);Pain Pain - part of body:  (chest and back discomfort with increased resp rate)    Time: 1610-9604 PT Time Calculation (min) (ACUTE ONLY): 23 min   Charges:   PT Evaluation $PT Eval Moderate Complexity: 1 Mod   PT General Charges $$ ACUTE PT VISIT: 1 Visit         11/08/2023  [name], PT Acute Rehabilitation Services [phone]  (office)  [name] Earla Charlie 11/08/2023, 12:44 PM

## 2023-11-08 NOTE — Progress Notes (Signed)
 OT Cancellation Note  Patient Details Name: Cory Russell MRN: [id number] DOB: 1991/04/27   Cancelled Treatment:    Reason Eval/Treat Not Completed: Patient at procedure or test/ unavailable (Pt going out with transport and asking for pain meds/breathing tx. Will follow up.) Rien Marland K OTR/L  Acute Rehab Services  [phone] office number   Stevphen Elders 11/08/2023, 9:30 AM

## 2023-11-08 NOTE — Progress Notes (Signed)
 Initial Nutrition Assessment  DOCUMENTATION CODES:   Severe malnutrition in context of chronic illness, Underweight  INTERVENTION:   Ensure Plus High Protein po TID, each supplement provides 350 kcal and 20 grams of protein.  Encourage PO intake  Discussed importance of adequate nutrition for weight restoration    NUTRITION DIAGNOSIS:   Severe Malnutrition related to chronic illness (HIV) as evidenced by severe muscle depletion, severe fat depletion.  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   Spoke with pt and his mom. Per pt he has had a poor appetite for a long time. Was previously on Megace  which helped but he does not routinely take meds.  Pt reports having lost 20 lb Per chart review pt with 13.9% weight loss x 7 months.  Pt reports having stopped his HIV meds due to nausea.   Medications reviewed and include: Biktarvy , prezcobix , remeron  Unasyn Doxycycline Bactrim  valproate  Labs reviewed:  Phos 2.0    NUTRITION - FOCUSED PHYSICAL EXAM:  Flowsheet Row Most Recent Value  Orbital Region Severe depletion  Upper Arm Region Severe depletion  Thoracic and Lumbar Region Severe depletion  Buccal Region Severe depletion  Clavicle Bone Region Severe depletion  Clavicle and Acromion Bone Region Severe depletion  Scapular Bone Region Severe depletion  Dorsal Hand Severe depletion  Patellar Region No depletion  Anterior Thigh Region No depletion  Posterior Calf Region No depletion  Edema (RD Assessment) None  Hair Reviewed  Eyes Reviewed  Mouth Reviewed  Skin Reviewed  Nails  Reviewed       Diet Order:   Diet Order             Diet regular Room service appropriate? Yes; Fluid consistency: Thin  Diet effective now                   EDUCATION NEEDS:   Education needs have been addressed  Skin:  Skin Assessment: Reviewed RN Assessment  Last BM:  6/3  Height:   Ht Readings from Last 1 Encounters:  11/08/23 5\' 7"  (1.702 m)    Weight:   Wt Readings from Last 1 Encounters:  11/08/23 49 kg    BMI:  Body mass index is 16.92 kg/m.  Estimated Nutritional Needs:   Kcal:  1800-2200  Protein:  80-100 grams  Fluid:  >1.8 L/day  Randine Butcher., RD, LDN, CNSC See AMiON for contact information

## 2023-11-08 NOTE — Progress Notes (Signed)
 Pharmacy: Antimicrobial Stewardship Note  74 YOM with hx HIV noncompliant with medications and last known CD4 124 in Nov '24 who presents with concerns for seizures in setting of noncompliance and PNA. Plan is to start Bactrim  for empiric PJP treatment given concerns with low CD4.   Last known ART regimen was Biktarvy  + Prezcobix  - noted only Prezcobix  ordered inpatient, will resume Biktarvy . New CD4/VL pending this admission.    Plan - Start sulfamethoxazole /trimethoprim  245 mg every 8 hours (~15 mg/kg/day of TMP component) - Resume Biktarvy  po once daily - Add prednisone 40 mg po bid x 5d followed by 40 mg daily x 5d followed by 20 mg daily x 11d - Add nystatin suspension for oral thrush - Will monitor renal function, lytes  Thank you for allowing pharmacy to be a part of this patient's care.  Garland Junk, PharmD, BCPS, BCIDP Infectious Diseases Clinical Pharmacist 11/08/2023 8:50 AM   **Pharmacist phone directory can now be found on [email or web address] ([email or web address]).  Listed under [facility].

## 2023-11-08 NOTE — Progress Notes (Signed)
 EEG complete - results pending

## 2023-11-08 NOTE — H&P (Signed)
 History and Physical  Cory Russell [name] DOB: February 13, 1991 DOA: 11/08/2023  Referring physician: Accepted by Dr. Michell Ahumada [facility], hospitalist service. PCP: Jayson Michael, MD  Outpatient Specialists: Infectious disease. Patient coming from: Home through [facility].  Chief Complaint: Seizure-like activity.  HPI: Cory Russell is a 33 y.o. male with medical history significant for HIV with noncompliance, seizure disorder on valproic  acid, hypertension, chronic depression on mirtazapine , rectal bleeding, recently diagnosed with right lower lobe pneumonia at [facility], treated with Augmentin and p.o. doxycycline, who presents to the ER at outside facility with seizure-like activity.  Reportedly patient has not been compliant with his AEDs.  Workup at outside facility revealed likely residual from pneumonia seen on CT scan without evidence of acute pulmonary embolism (10/30/23), electrolyte imbalance with hyponatremia serum sodium 130, mild metabolic alkalosis with serum bicarb 32.  Noncontrast head CT was nonacute.  The patient's infectious disease Dr. Artemio Larry from [facility] health was contacted and recommended transfer to [facility] for further management of his present condition.  Admitted by [facility], hospitalist service.  Accepted by Dr. Michell Ahumada and transferred to [facility] progressive care unit as observation status.  At the time of this visit, the patient is in the room accompanied by his mother.  He has a persistent productive cough.  Diffuse Rales noted on exam.  ED Course: Temperature Tmax 102.8.  BP 118/80, pulse 106, respiration rate 20, O2 saturation 94% on room air.  Review of Systems: Review of systems as noted in the HPI. All other systems reviewed and are negative.   Past Medical History:  Diagnosis Date   Anal pain    w/ anal mass   Cocaine abuse (HCC)    Depression    GERD (gastroesophageal reflux disease)    HIV (human  immunodeficiency virus infection) (HCC)    followed by ID-- dr comer   Hypertension    no meds , followed by dr comer   Past Surgical History:  Procedure Laterality Date   LASER ABLATION CONDOLAMATA N/A 12/23/2021   Procedure: LASER ABLATION CONDOLAMATA;  Surgeon: Joyce Nixon, MD;  Location: [facility];  Service: General;  Laterality: N/A;   MASS EXCISION N/A 06/30/2021   Procedure: EXCISION OF ANAL CONDYLOMA , ABLATION OF ANAL CONDYLOMA;  Surgeon: Joyce Nixon, MD;  Location: [facility] [place];  Service: General;  Laterality: N/A;    Social History:  reports that he has never smoked. He has never used smokeless tobacco. He reports that he does not currently use alcohol. He reports current drug use. Drugs: Marijuana and Cocaine.   No Known Allergies  Family History  Problem Relation Age of Onset   Diabetes Mother    Hypertension Father    Prostate cancer Father    Alcoholism Brother    Hypertension Brother       Prior to Admission medications   Medication Sig Start Date End Date Taking? Authorizing Provider  bictegravir-emtricitabine -tenofovir  AF (BIKTARVY ) 50-200-25 MG TABS tablet Take 1 tablet by mouth daily. 04/12/23   Liane Redman, MD  mirtazapine  (REMERON ) 15 MG tablet Take 1 tablet (15 mg total) by mouth at bedtime. 04/12/23   Liane Redman, MD  ondansetron  (ZOFRAN ) 4 MG tablet Take 1 tablet (4 mg total) by mouth daily as needed for nausea or vomiting. Take 30 minutes prior to medication 01/04/23   Lina Render, MD  ondansetron  (ZOFRAN -ODT) 4 MG disintegrating tablet Take 1 tablet (4 mg total) by mouth every 8 (eight) hours as  needed for nausea or vomiting. 10/04/23   Zelaya, Oscar A, PA-C  sulfamethoxazole -trimethoprim  (BACTRIM  DS) 800-160 MG tablet Take 1 tablet by mouth 3 (three) times a week. 01/20/23   Liane Redman, MD  valproic  acid (DEPAKENE ) 250 MG/5ML solution Take 5 mLs (250 mg total) by mouth 4 (four) times daily. 04/12/23    Liane Redman, MD  zolpidem  (AMBIEN ) 5 MG tablet Take 1 tablet (5 mg total) by mouth at bedtime as needed for sleep. 01/19/23   Liane Redman, MD    Physical Exam: BP 108/67 (BP Location: Left Arm)   Pulse 97   Temp (!) 101.1 F (38.4 C) (Oral)   Resp 18   Ht 5\' 7"  (1.702 m)   Wt 49 kg   SpO2 94%   BMI 16.92 kg/m   General: 33 y.o. year-old male well developed well nourished in no acute distress.  Alert and oriented x3. Cardiovascular: Regular rate and rhythm with no rubs or gallops.  No thyromegaly or JVD noted.  No lower extremity edema. 2/4 pulses in all 4 extremities. Respiratory: Clear to auscultation with no wheezes or rales. Good inspiratory effort. Abdomen: Soft nontender nondistended with normal bowel sounds x4 quadrants. Muskuloskeletal: No cyanosis, clubbing or edema noted bilaterally Neuro: CN II-XII intact, strength, sensation, reflexes Skin: No ulcerative lesions noted or rashes Psychiatry: Judgement and insight appear normal. Mood is appropriate for condition and setting          Labs on Admission:  Basic Metabolic Panel: No results for input(s): "NA", "K", "CL", "CO2", "GLUCOSE", "BUN", "CREATININE", "CALCIUM", "MG", "PHOS" in the last 168 hours. Liver Function Tests: No results for input(s): "AST", "ALT", "ALKPHOS", "BILITOT", "PROT", "ALBUMIN" in the last 168 hours. No results for input(s): "LIPASE", "AMYLASE" in the last 168 hours. No results for input(s): "AMMONIA" in the last 168 hours. CBC: No results for input(s): "WBC", "NEUTROABS", "HGB", "HCT", "MCV", "PLT" in the last 168 hours. Cardiac Enzymes: No results for input(s): "CKTOTAL", "CKMB", "CKMBINDEX", "TROPONINI" in the last 168 hours.  BNP (last 3 results) No results for input(s): "BNP" in the last 8760 hours.  ProBNP (last 3 results) No results for input(s): "PROBNP" in the last 8760 hours.  CBG: No results for input(s): "GLUCAP" in the last 168 hours.  Radiological Exams on  Admission: No results found.  EKG: I independently viewed the EKG done and my findings are as followed: None available at the time of this visit.  Assessment/Plan Present on Admission: **None**  Principal Problem:   Seizure-like activity (HCC)  Seizure-like activity Seizure disorder with noncompliance Noncontrast head CT was nonacute Resume home AED Seizure precautions IV Ativan as needed for breakthrough seizures Follow EEG  HIV with noncompliance with ART Resume home ART Consider infectious disease consultation in the morning Transferred to [facility] at the recommendation of ID, Dr. Artemio Larry. Follow CD4 count and HIV viral load  Sepsis secondary to recent diagnosis of right lower lobe pneumonia, POA Febrile with Tmax 102.8, tachycardic with pulse of 114, infiltrates on CXR and CT scan Persistent productive cough on exam Obtain sputum culture Follow peripheral blood cultures x 2 Follow lactic acid IV Unasyn, IV doxycycline Monitor fever curve and WBCs Maintain MAP greater than 65 IV fluid hydration NS at 75 cc/h x 1 day Did not receive aggressive IV fluid to avoid volume overload and acute respiratory distress.  Severe protein calorie malnutrition BMI 16 Dietitian consult Encourage oral intake as tolerated  Generalized weakness PT OT assessment Fall precautions  Chronic depression  and poor oral intake Resume home mirtazapine    Time: 75 minutes.   DVT prophylaxis: Subcu Lovenox  daily.  Code Status: Full code.  Family Communication: Mother at bedside.  Disposition Plan: Admitted to aggressive care unit.  Consults called: Consider infectious disease consultation in the morning.  Admission status: Observation status   Status is: Observation    Bary Boss MD [facility] Pager [phone]  If 7PM-7AM, please contact night-coverage [email or web address] Password [email or web address]  11/08/2023, 3:41 AM

## 2023-11-09 ENCOUNTER — Other Ambulatory Visit (HOSPITAL_COMMUNITY): Payer: Self-pay

## 2023-11-09 ENCOUNTER — Inpatient Hospital Stay (HOSPITAL_COMMUNITY)

## 2023-11-09 ENCOUNTER — Telehealth (HOSPITAL_COMMUNITY): Payer: Self-pay | Admitting: Pharmacy Technician

## 2023-11-09 DIAGNOSIS — R569 Unspecified convulsions: Secondary | ICD-10-CM | POA: Diagnosis not present

## 2023-11-09 LAB — COMPREHENSIVE METABOLIC PANEL WITH GFR
ALT: 24 U/L (ref 0–44)
AST: 35 U/L (ref 15–41)
Albumin: 2.3 g/dL — ABNORMAL LOW (ref 3.5–5.0)
Alkaline Phosphatase: 46 U/L (ref 38–126)
Anion gap: 10 (ref 5–15)
BUN: 16 mg/dL (ref 6–20)
CO2: 23 mmol/L (ref 22–32)
Calcium: 9.2 mg/dL (ref 8.9–10.3)
Chloride: 103 mmol/L (ref 98–111)
Creatinine, Ser: 0.92 mg/dL (ref 0.61–1.24)
GFR, Estimated: >60 mL/min (ref >60)
Glucose, Bld: 109 mg/dL — ABNORMAL HIGH (ref 70–99)
Potassium: 4.7 mmol/L (ref 3.5–5.1)
Sodium: 136 mmol/L (ref 135–145)
Total Bilirubin: 0.5 mg/dL (ref 0.0–1.2)
Total Protein: 7.6 g/dL (ref 6.5–8.1)

## 2023-11-09 LAB — PHOSPHORUS: Phosphorus: 3.9 mg/dL (ref 2.5–4.6)

## 2023-11-09 LAB — HIV-1 RNA QUANT-NO REFLEX-BLD
HIV 1 RNA Quant: 306000 {copies}/mL
LOG10 HIV-1 RNA: 5.486 {Log_copies}/mL

## 2023-11-09 LAB — CBC
HCT: 31.3 % — ABNORMAL LOW (ref 39.0–52.0)
Hemoglobin: 10.4 g/dL — ABNORMAL LOW (ref 13.0–17.0)
MCH: 28.1 pg (ref 26.0–34.0)
MCHC: 33.2 g/dL (ref 30.0–36.0)
MCV: 84.6 fL (ref 80.0–100.0)
Platelets: 328 10*3/uL (ref 150–400)
RBC: 3.7 MIL/uL — ABNORMAL LOW (ref 4.22–5.81)
RDW: 13.4 % (ref 11.5–15.5)
WBC: 6.9 10*3/uL (ref 4.0–10.5)
nRBC: 0 % (ref 0.0–0.2)

## 2023-11-09 LAB — RPR: RPR Ser Ql: REACTIVE — AB

## 2023-11-09 LAB — MAGNESIUM: Magnesium: 2 mg/dL (ref 1.7–2.4)

## 2023-11-09 MED ORDER — BENZONATATE 100 MG PO CAPS
200.0000 mg | ORAL_CAPSULE | Freq: Two times a day (BID) | ORAL | Status: DC | PRN
  Administered 2023-11-09 – 2023-11-11 (×3): 200 mg via ORAL
  Filled 2023-11-09 (×3): qty 2

## 2023-11-09 MED ORDER — LIDOCAINE 5 % EX PTCH
1.0000 | MEDICATED_PATCH | CUTANEOUS | Status: DC
  Administered 2023-11-09 – 2023-12-01 (×22): 1 via TRANSDERMAL
  Filled 2023-11-09 (×24): qty 1

## 2023-11-09 MED ORDER — SODIUM CHLORIDE 0.9 % IV SOLN
2.0000 g | INTRAVENOUS | Status: DC
  Administered 2023-11-09: 2 g via INTRAVENOUS
  Filled 2023-11-09: qty 20

## 2023-11-09 MED ORDER — IOHEXOL 350 MG/ML SOLN
75.0000 mL | Freq: Once | INTRAVENOUS | Status: AC | PRN
  Administered 2023-11-09: 75 mL via INTRAVENOUS

## 2023-11-09 MED ORDER — OXYCODONE HCL 5 MG PO TABS
5.0000 mg | ORAL_TABLET | Freq: Four times a day (QID) | ORAL | Status: DC | PRN
  Administered 2023-11-09 – 2023-11-25 (×39): 5 mg via ORAL
  Filled 2023-11-09 (×45): qty 1

## 2023-11-09 NOTE — Progress Notes (Signed)
 Physical Therapy Treatment Patient Details Name: Cory Russell MRN: [id number] DOB: [birth year] Today's Date: 11/09/2023   History of Present Illness Pt is a 33 yr old male who presented 11/08/23 with seizure-like activity. EEG pending. Pt was recently was dx with right lobe PNA. PMH: HIV, seizure disorder, HTN, rectal bleeding, depression.    PT Comments  Pt resting in bed on arrival, presenting with some anxiousness in anticipation of mobility and subsequent SOB/coughing episode (which occurred post PT session prior date). Pt declining OOB mobility, however receptive to education on breathing techniques and education on managing DOE as well as feelings of anxiousness with activity. Pt able to return demo of PLB. Pt educated on IS use for cardiopulmomary compliance with pt able to demonstrate x3 with pt pulling up to . Pt agreeable to ambulation in session tomorrow. Pt continues to benefit from skilled PT services to progress toward functional mobility goals.     If plan is discharge home, recommend the following: Assistance with cooking/housework;Assist for transportation   Can travel by private vehicle        Equipment Recommendations  None recommended by PT    Recommendations for Other Services       Precautions / Restrictions Precautions Precautions: Other (comment) (watch o2) Recall of Precautions/Restrictions: Intact Restrictions Weight Bearing Restrictions Per Provider Order: No     Mobility  Bed Mobility Overal bed mobility: Modified Independent                  Transfers                   General transfer comment: pt declining EOB/OOB mobility due to fear of coughing episode and SOB    Ambulation/Gait                   Stairs             Wheelchair Mobility     Tilt Bed    Modified Rankin (Stroke Patients Only)       Balance Overall balance assessment: No apparent balance deficits (not formally assessed), Needs  assistance   Sitting balance-Leahy Scale: Normal                                      Communication Communication Communication: No apparent difficulties  Cognition Arousal: Alert Behavior During Therapy: Anxious   PT - Cognitive impairments: No apparent impairments                       PT - Cognition Comments: very anxious about possibility of SOB with mobility Following commands: Intact      Cueing    Exercises Other Exercises Other Exercises: IS use x5 with pt able to pull up to    General Comments General comments (skin integrity, edema, etc.): pt mother present and supportive, pt declining OOB mobility, educated pt on importance of mobility for cardiopulmonary compliance as well as educated and practiced breathing techniques for when feeling SOB (PLB, etc)      Pertinent Vitals/Pain Pain Assessment Pain Assessment: No/denies pain    Home Living                          Prior Function            PT Goals (current goals can now be found in the  care plan section) Acute Rehab PT Goals PT Goal Formulation: With patient Time For Goal Achievement: 11/22/23    Frequency    Min 3X/week      PT Plan      Co-evaluation              AM-PAC PT "6 Clicks" Mobility   Outcome Measure  Help needed turning from your back to your side while in a flat bed without using bedrails?: None Help needed moving from lying on your back to sitting on the side of a flat bed without using bedrails?: A Little Help needed moving to and from a bed to a chair (including a wheelchair)?: A Little Help needed standing up from a chair using your arms (e.g., wheelchair or bedside chair)?: A Little Help needed to walk in hospital room?: A Little Help needed climbing 3-5 steps with a railing? : A Little 6 Click Score: 19    End of Session   Activity Tolerance: Other (comment) (limited by anxiety) Patient left: in bed;with call bell/phone  within reach;with family/visitor present Nurse Communication: Mobility status PT Visit Diagnosis: Difficulty in walking, not elsewhere classified (R26.2);Pain Pain - part of body:  (chest and back discomfort with increased resp rate)     Time: 1610-9604 PT Time Calculation (min) (ACUTE ONLY): 10 min  Charges:    $Therapeutic Activity: 8-22 mins PT General Charges $$ ACUTE PT VISIT: 1 Visit                     Stephen Baruch R. PTA Acute Rehabilitation Services Office: [phone]   Agapito Horseman 11/09/2023, 12:09 PM

## 2023-11-09 NOTE — Plan of Care (Signed)
  Problem: Education: Goal: Knowledge of General Education information will improve Description: Including pain rating scale, medication(s)/side effects and non-pharmacologic comfort measures Outcome: Progressing   Problem: Health Behavior/Discharge Planning: Goal: Ability to manage health-related needs will improve Outcome: Progressing   Problem: Clinical Measurements: Goal: Ability to maintain clinical measurements within normal limits will improve Outcome: Progressing Goal: Will remain free from infection Outcome: Progressing   Problem: Education: Goal: Knowledge of General Education information will improve Description: Including pain rating scale, medication(s)/side effects and non-pharmacologic comfort measures Outcome: Progressing   Problem: Health Behavior/Discharge Planning: Goal: Ability to manage health-related needs will improve Outcome: Progressing   Problem: Clinical Measurements: Goal: Ability to maintain clinical measurements within normal limits will improve Outcome: Progressing Goal: Will remain free from infection Outcome: Progressing

## 2023-11-09 NOTE — TOC Initial Note (Signed)
 Transition of Care [facility]) - Initial/Assessment Note    Patient Details  Name: Cory Russell MRN: [id number] Date of Birth: [birth year]  Transition of Care [facility]) CM/SW Contact:    Cosimo Diones, RN Phone Number: 11/09/2023, 3:36 PM  Clinical Narrative:  Patient presented for seizure like activity. PTA patient states he was from home with mother. Mother in the room at the time of the visit. Patient states his PCP is ID and his mother takes him to appointments. Patient states his medications are delivered via [facility]. No home needs identified during the visit.                   Expected Discharge Plan: Home/Self Care Barriers to Discharge: No Barriers Identified   Patient Goals and CMS Choice Patient states their goals for this hospitalization and ongoing recovery are:: Plan for home once stable.   Choice offered to / list presented to : NA      Expected Discharge Plan and Services   Discharge Planning Services: CM Consult Post Acute Care Choice: NA Living arrangements for the past 2 months: Single Family Home                   DME Agency: NA       HH Arranged: NA          Prior Living Arrangements/Services Living arrangements for the past 2 months: Single Family Home Lives with:: Parents Patient language and need for interpreter reviewed:: Yes Do you feel safe going back to the place where you live?: Yes      Need for Family Participation in Patient Care: No (Comment) Care giver support system in place?: No (comment)   Criminal Activity/Legal Involvement Pertinent to Current Situation/Hospitalization: No - Comment as needed  Activities of Daily Living   ADL Screening (condition at time of admission) Independently performs ADLs?: Yes (appropriate for developmental age) Is the patient deaf or have difficulty hearing?: No Does the patient have difficulty seeing, even when wearing glasses/contacts?: No Does the patient have difficulty concentrating,  remembering, or making decisions?: No  Permission Sought/Granted Permission sought to share information with : Family Supports, Case Manager                Emotional Assessment Appearance:: Appears stated age Attitude/Demeanor/Rapport: Engaged Affect (typically observed): Appropriate Orientation: : Oriented to Self, Oriented to Place, Oriented to  Time, Oriented to Situation Alcohol / Substance Use: Not Applicable Psych Involvement: No (comment)  Admission diagnosis:  Seizure-like activity (HCC) [R56.9] CAP (community acquired pneumonia) [J18.9] Patient Active Problem List   Diagnosis Date Noted   Seizure-like activity (HCC) 11/08/2023   CAP (community acquired pneumonia) 11/08/2023   Protein-calorie malnutrition, severe 11/08/2023   Seizure (HCC) 02/28/2023   Nausea 01/05/2023   HIV (human immunodeficiency virus infection) (HCC)    Hypertension    Rectal mass 09/30/2019   Syphilis 09/30/2019   Hemorrhoids 01/23/2019   Routine screening for STI (sexually transmitted infection) 11/27/2018   Healthcare maintenance 06/25/2018   Depression 01/26/2015   Encounter for long-term (current) use of medications 08/28/2014   Poor appetite 08/28/2014   Human immunodeficiency virus (HIV) disease (HCC) 12/20/2013   PCP:  Liane Redman, MD Pharmacy:   [id number] - [place], [place] - [address] AT [facility] OF [address] & [address] [place] [address] Phone: [phone] Fax: [phone]  [place] [facility] [address] [place] [address]  Phone: [phone] Fax: [phone]  [facility] [address] [place] [address] Phone: [phone] Fax: [phone]     Social Drivers of Health (SDOH) Social History: SDOH Screenings   Food Insecurity: No Food Insecurity (11/08/2023)  Housing: Low Risk  (11/08/2023)  Transportation Needs: No Transportation Needs  (11/08/2023)  Utilities: Not At Risk (11/08/2023)  Depression (PHQ2-9): Low Risk  (04/12/2023)  Financial Resource Strain: Low Risk  (02/27/2023)   Received from [facility]  Tobacco Use: Low Risk  (11/08/2023)   SDOH Interventions:     Readmission Risk Interventions     No data to display

## 2023-11-09 NOTE — Progress Notes (Signed)
 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 Other fungal etiologies not ruled out; elevated LDH. Extended viral panel negative. Will follow up work up below to guide management. 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 Anal pain, frank blood on rectal examination from outer anal rim protrusion and anal canal. Concerned about G/C proctitis vs abscess vs fistula. No recent CT scan on file. Continues to be afebrile and with improvement in WBC count, now in the normal range. Hx of syphilis: Titer at 1:32 this admission. Seizure-like activity: initially thought to be in the setting of fever. No AEDs in the prior months. Spot EEG and brain MRI without overt process. Will need long term AED.  Severe protein-calorie malnutrition: Query if thrush if contributing. Likely in setting of severe HIV immunosuppression.  L hepatic lesion: noted on CT PE protocol study. No hepatic enzyme dysfunction. MRI liver recommended. Per primary team  PLAN: Continue Biktarvy  and Prezcobix  Continue Bactrim  and prednisone for PJP coverage Continue Doxycycline and Unasyn for CAP coverage (6/3 - Continue Nystatin suspension for thrush Will order CT pelvis with contrast to evaluate for abscess vs stranding Follow up anal swab for G/C; already on doxycycline for chlamydia coverage Start Doxycycline  Follow up Bcx, pneumocystis smear, Fungitell Continue valproic  acid  Principal Problem:   Seizure-like activity (HCC) Active Problems:   CAP (community acquired pneumonia)   Protein-calorie malnutrition, severe    bictegravir-emtricitabine -tenofovir  AF  1 tablet Oral Daily    darunavir -cobicistat   1 tablet Oral Q breakfast   enoxaparin  (LOVENOX ) injection  40 mg Subcutaneous Daily   feeding supplement  237 mL Oral TID BM   guaiFENesin  600 mg Oral BID   mirtazapine   15 mg Oral QHS   nystatin  5 mL Oral QID   predniSONE  40 mg Oral BID WC   Followed by   Cecily Cohen ON 11/13/2023] predniSONE  40 mg Oral Q breakfast   Followed by   Cecily Cohen ON 11/18/2023] predniSONE  20 mg Oral Q breakfast   sodium chloride  HYPERTONIC  4 mL Nebulization BID    SUBJECTIVE: Patient doing better this morning. Continues to endorse dyspnea on exertion and after cough paroxysms, requiring supplemental oxygen.  However, has been needing less supplemental oxygen at rest. No recent seizure activity. Continues to endorse chest pain with deep inspiration and with cough. There is also L shoulder blade pain present for the past few months. No radiation down the arm.   Of note, patient reports a bleeding anal lesion for the past year. He reports that has been getting worse for the past few months. There is pus like material expressing from the anus as well. Denies sexual (anal or otherwise) in the past ~5 months. 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 No recent follow up for this as General Surgery was awaiting improvement in HIV viral load.  No abdominal pain, diarrhea, or constipation.   No Known Allergies  OBJECTIVE: Vitals:   11/08/23 2340 11/09/23 0349 11/09/23 0816 11/09/23 1215  BP: (!) 104/58 (!) 101/57 110/64 99/81  Pulse: Aaron Aas)  57 67 85 93  Resp: 18 18 20    Temp: (!) 97.4 F (36.3 C) (!) 97.4 F (36.3 C) 98.1 F (36.7 C) 98.6 F (37 C)  TempSrc: Oral Oral Oral Oral  SpO2: 97% 100% 96% 99%  Weight:      Height:       Body mass index is 16.92 kg/m.  General: Cachectic male in NAD Head: Normocephalic. Atraumatic. CV: RRR. No murmurs, rubs, or gallops. No LE edema Pulmonary: Decreased breath sounds on R lower base compared to L. Abdominal: Soft,  nontender, nondistended. Normal bowel sounds. Extremities: Palpable radial and DP pulses. Normal ROM. Skin: Warm and dry. No obvious rash or lesions. Neuro: A&Ox3. Moves all extremities. Normal sensation. No focal deficit. Psych: Normal mood and affect  Physical Exam Exam conducted with a chaperone present.  Genitourinary:      Comments: Chaperoned exam - Patient with ~1cm skin color lesion that appears as an external hemorrhoid. There was blood on the outer border of it, away from anal sphincter. No pain on palpation.  On rectal exam, no masses palpated. However, there was increased tenderness. There was frank blood and white-yellow material (fecal vs pus-like).      Lab Results Lab Results  Component Value Date   WBC 6.9 11/09/2023   HGB 10.4 (L) 11/09/2023   HCT 31.3 (L) 11/09/2023   MCV 84.6 11/09/2023   PLT 328 11/09/2023    Lab Results  Component Value Date   CREATININE 0.92 11/09/2023   BUN 16 11/09/2023   NA 136 11/09/2023   K 4.7 11/09/2023   CL 103 11/09/2023   CO2 23 11/09/2023    Lab Results  Component Value Date   ALT 24 11/09/2023   AST 35 11/09/2023   ALKPHOS 46 11/09/2023   BILITOT 0.5 11/09/2023     Microbiology: Recent Results (from the past 240 hours)  Culture, blood (Routine X 2) w Reflex to ID Panel     Status: None (Preliminary result)   Collection Time: 11/08/23  4:18 AM   Specimen: BLOOD  Result Value Ref Range Status   Specimen Description BLOOD BLOOD RIGHT ARM  Final   Special Requests   Final    BOTTLES DRAWN AEROBIC AND ANAEROBIC Blood Culture adequate volume   Culture   Final    NO GROWTH 1 DAY Performed at [facility], [address], [place], [place] [address]    Report Status PENDING  Incomplete  Culture, blood (Routine X 2) w Reflex to ID Panel     Status: None (Preliminary result)   Collection Time: 11/08/23  4:22 AM   Specimen: BLOOD  Result Value Ref Range Status   Specimen Description BLOOD BLOOD RIGHT ARM   Final   Special Requests   Final    BOTTLES DRAWN AEROBIC AND ANAEROBIC Blood Culture adequate volume   Culture   Final    NO GROWTH 1 DAY Performed at [facility], [address] [place], [place] [address]    Report Status PENDING  Incomplete  Respiratory (~20 pathogens) panel by PCR     Status: None   Collection Time: 11/08/23  8:42 AM   Specimen: Nasopharyngeal Swab; Respiratory  Result Value Ref Range Status   Adenovirus NOT DETECTED NOT DETECTED Final   Coronavirus 229E NOT DETECTED NOT DETECTED Final    Comment: (NOTE) The Coronavirus on the Respiratory Panel, DOES NOT test for the novel  Coronavirus (2019 nCoV)    Coronavirus HKU1  NOT DETECTED NOT DETECTED Final   Coronavirus NL63 NOT DETECTED NOT DETECTED Final   Coronavirus OC43 NOT DETECTED NOT DETECTED Final   Metapneumovirus NOT DETECTED NOT DETECTED Final   Rhinovirus / Enterovirus NOT DETECTED NOT DETECTED Final   Influenza A NOT DETECTED NOT DETECTED Final   Influenza B NOT DETECTED NOT DETECTED Final   Parainfluenza Virus 1 NOT DETECTED NOT DETECTED Final   Parainfluenza Virus 2 NOT DETECTED NOT DETECTED Final   Parainfluenza Virus 3 NOT DETECTED NOT DETECTED Final   Parainfluenza Virus 4 NOT DETECTED NOT DETECTED Final   Respiratory Syncytial Virus NOT DETECTED NOT DETECTED Final   Bordetella pertussis NOT DETECTED NOT DETECTED Final   Bordetella Parapertussis NOT DETECTED NOT DETECTED Final   Chlamydophila pneumoniae NOT DETECTED NOT DETECTED Final   Mycoplasma pneumoniae NOT DETECTED NOT DETECTED Final    Comment: Performed at [facility], [address], [place], [place] [address]   Cathey Clunes, MD  [facility]   11/09/23 1:05 PM

## 2023-11-09 NOTE — Progress Notes (Signed)
 PROGRESS NOTE  Roldan Laforest ZOX:[id number] DOB: June 17, 1990 DOA: 11/08/2023 PCP: Liane Redman, MD   LOS: 1 day   Brief Narrative / Interim history: 33 year old male with history of HIV with nonadherence to medications, seizure disorder on valproic  acid, comes into the hospital with seizure-like activity.  He has not been taking his AEDs.  Workup showed residual pneumonia, he was transferred here for ID consultation.  Subjective / 24h Interval events: He is complaining of pleuritic type back pain, still feeling pretty short winded with minimal activities  Assesement and Plan: Principal problem Sepsis due to pneumonia, immunosuppressed with underlying HIV/AIDS -ID consulted and following.  Patient antiretroviral therapy has been restarted - Has been placed on Unasyn, doxycycline, Bactrim  as well as prednisone given hypoxemia and concern for PJP - Pneumocystis smear, Legionella urinary antigen all pending.  RVP negative - HIV RNA quantitative pending  Active problems Acute hypoxic respiratory failure due to hypoxemia-supplemental oxygen as needed remains pretty dyspneic with minimal ambulation.  Wean off to room air as tolerated.  Was started on prednisone  History of syphilis-RPR positive, troponin pallidum antibody pending.  Decision to treat deferred to ID  History of seizure disorder-this is likely in the setting of nonadherence to AEDs.  MRI of the brain with and without contrast is fairly unremarkable.  For now continue valproate  Severe protein calorie malnutrition-BMI 16.  On Remeron   Scheduled Meds:  bictegravir-emtricitabine -tenofovir  AF  1 tablet Oral Daily   darunavir -cobicistat   1 tablet Oral Q breakfast   enoxaparin  (LOVENOX ) injection  40 mg Subcutaneous Daily   feeding supplement  237 mL Oral TID BM   guaiFENesin  600 mg Oral BID   mirtazapine   15 mg Oral QHS   nystatin  5 mL Oral QID   predniSONE  40 mg Oral BID WC   Followed by   Cecily Cohen ON 11/13/2023] predniSONE   40 mg Oral Q breakfast   Followed by   Cecily Cohen ON 11/18/2023] predniSONE  20 mg Oral Q breakfast   sodium chloride  HYPERTONIC  4 mL Nebulization BID   Continuous Infusions:  ampicillin-sulbactam (UNASYN) IV 3 g (11/09/23 0533)   doxycycline (VIBRAMYCIN) IV 100 mg (11/09/23 8119)   sulfamethoxazole -trimethoprim  244.96 mg of trimethoprim  (11/09/23 0536)   valproate sodium  500 mg (11/09/23 0822)   PRN Meds:.ipratropium-albuterol, LORazepam, melatonin, oxyCODONE , polyethylene glycol, prochlorperazine  Current Outpatient Medications  Medication Instructions   amoxicillin  (AMOXIL ) 500 MG capsule Take by mouth.   amoxicillin -clavulanate (AUGMENTIN) 875-125 MG tablet 1 tablet, Oral, 2 times daily   bictegravir-emtricitabine -tenofovir  AF (BIKTARVY ) 50-200-25 MG TABS tablet 1 tablet, Oral, Daily   cetirizine (ZYRTEC) 10 MG tablet 1 tablet, Oral, Daily   doxycycline (VIBRAMYCIN) 100 mg, Oral, 2 times daily   mirtazapine  (REMERON ) 15 mg, Oral, Daily at bedtime   ondansetron  (ZOFRAN ) 4 mg, Oral, Daily PRN, Take 30 minutes prior to medication   predniSONE (DELTASONE) 5 MG tablet Take per taper instructions    sulfamethoxazole -trimethoprim  (BACTRIM  DS) 800-160 MG tablet 1 tablet, Oral, 3 times weekly   valproic  acid (DEPAKENE ) 250 mg, Oral, 4 times daily   zolpidem  (AMBIEN ) 5 mg, Oral, At bedtime PRN    Diet Orders (From admission, onward)     Start     Ordered   11/08/23 0103  Diet regular Room service appropriate? Yes; Fluid consistency: Thin  Diet effective now       Question Answer Comment  Room service appropriate? Yes   Fluid consistency: Thin      11/08/23 0102  DVT prophylaxis: enoxaparin  (LOVENOX ) injection 40 mg Start: 11/08/23 1000   Lab Results  Component Value Date   PLT 328 11/09/2023      Code Status: Full Code  Family Communication: Mother present at bedside  Status is: Inpatient Remains inpatient appropriate because: Severity of illness   Level of  care: Progressive  Consultants:  ID  Objective: Vitals:   11/08/23 2122 11/08/23 2340 11/09/23 0349 11/09/23 0816  BP:  (!) 104/58 (!) 101/57 110/64  Pulse:  (!) 57 67 85  Resp:  18 18 20   Temp:  (!) 97.4 F (36.3 C) (!) 97.4 F (36.3 C) 98.1 F (36.7 C)  TempSrc:  Oral Oral Oral  SpO2: 97% 97% 100% 96%  Weight:      Height:        Intake/Output Summary (Last 24 hours) at 11/09/2023 1001 Last data filed at 11/08/2023 2207 Gross per 24 hour  Intake 960 ml  Output 1000 ml  Net -40 ml   Wt Readings from Last 3 Encounters:  11/08/23 49 kg  10/03/23 54.4 kg  04/12/23 57.3 kg    Examination:  Constitutional: NAD Eyes: no scleral icterus ENMT: Mucous membranes are moist.  Neck: normal, supple Respiratory: clear to auscultation bilaterally, no wheezing, no crackles. Normal respiratory effort.  Cardiovascular: Regular rate and rhythm, no murmurs / rubs / gallops.  Abdomen: non distended, no tenderness. Bowel sounds positive.  Musculoskeletal: no clubbing / cyanosis.   Data Reviewed: I have independently reviewed following labs and imaging studies   CBC Recent Labs  Lab 11/08/23 0418 11/09/23 0416  WBC 6.9 6.9  HGB 10.1* 10.4*  HCT 29.5* 31.3*  PLT 296 328  MCV 83.1 84.6  MCH 28.5 28.1  MCHC 34.2 33.2  RDW 13.1 13.4  LYMPHSABS 0.6*  --   MONOABS 0.6  --   EOSABS 0.0  --   BASOSABS 0.0  --     Recent Labs  Lab 11/08/23 0418 11/08/23 0811 11/09/23 0416  NA 131*  --  136  K 4.4  --  4.7  CL 96*  --  103  CO2 25  --  23  GLUCOSE 118*  --  109*  BUN 16  --  16  CREATININE 0.83  --  0.92  CALCIUM 8.9  --  9.2  AST 30  --  35  ALT 18  --  24  ALKPHOS 42  --  46  BILITOT 0.5  --  0.5  ALBUMIN 2.5*  --  2.3*  MG 1.7  --  2.0  PROCALCITON <0.10  --   --   LATICACIDVEN 1.2 0.8  --     ------------------------------------------------------------------------------------------------------------------ No results for input(s): "CHOL", "HDL", "LDLCALC",  "TRIG", "CHOLHDL", "LDLDIRECT" in the last 72 hours.  No results found for: "HGBA1C" ------------------------------------------------------------------------------------------------------------------ No results for input(s): "TSH", "T4TOTAL", "T3FREE", "THYROIDAB" in the last 72 hours.  Invalid input(s): "FREET3"  Cardiac Enzymes No results for input(s): "CKMB", "TROPONINI", "MYOGLOBIN" in the last 168 hours.  Invalid input(s): "CK" ------------------------------------------------------------------------------------------------------------------ No results found for: "BNP"  CBG: No results for input(s): "GLUCAP" in the last 168 hours.  Recent Results (from the past 240 hours)  Respiratory (~20 pathogens) panel by PCR     Status: None   Collection Time: 11/08/23  8:42 AM   Specimen: Nasopharyngeal Swab; Respiratory  Result Value Ref Range Status   Adenovirus NOT DETECTED NOT DETECTED Final   Coronavirus 229E NOT DETECTED NOT DETECTED Final    Comment: (NOTE) The  Coronavirus on the Respiratory Panel, DOES NOT test for the novel  Coronavirus (2019 nCoV)    Coronavirus HKU1 NOT DETECTED NOT DETECTED Final   Coronavirus NL63 NOT DETECTED NOT DETECTED Final   Coronavirus OC43 NOT DETECTED NOT DETECTED Final   Metapneumovirus NOT DETECTED NOT DETECTED Final   Rhinovirus / Enterovirus NOT DETECTED NOT DETECTED Final   Influenza A NOT DETECTED NOT DETECTED Final   Influenza B NOT DETECTED NOT DETECTED Final   Parainfluenza Virus 1 NOT DETECTED NOT DETECTED Final   Parainfluenza Virus 2 NOT DETECTED NOT DETECTED Final   Parainfluenza Virus 3 NOT DETECTED NOT DETECTED Final   Parainfluenza Virus 4 NOT DETECTED NOT DETECTED Final   Respiratory Syncytial Virus NOT DETECTED NOT DETECTED Final   Bordetella pertussis NOT DETECTED NOT DETECTED Final   Bordetella Parapertussis NOT DETECTED NOT DETECTED Final   Chlamydophila pneumoniae NOT DETECTED NOT DETECTED Final   Mycoplasma pneumoniae  NOT DETECTED NOT DETECTED Final    Comment: Performed at [facility], [address] [place], [place] [address]     Radiology Studies: MR BRAIN W WO CONTRAST Result Date: 11/08/2023 CLINICAL DATA:  Headache, fever EXAM: MRI HEAD WITHOUT AND WITH CONTRAST TECHNIQUE: Multiplanar, multiecho pulse sequences of the brain and surrounding structures were obtained without and with intravenous contrast. CONTRAST:  5mL GADAVIST GADOBUTROL 1 MMOL/ML IV SOLN COMPARISON:  None Available. FINDINGS: Brain: No acute infarction, hemorrhage, hydrocephalus, extra-axial collection or mass lesion. No pathologic enhancement. Vascular: Major arterial flow voids are maintained skull base. Skull and upper cervical spine: Normal marrow signal. Sinuses/Orbits: Clear sinuses.  No acute orbital findings. IMPRESSION: No evidence of acute intracranial abnormality. Electronically Signed   By: Stevenson Elbe M.D.   On: 11/08/2023 20:10     Kathlen Para, MD, PhD [facility]  Between 7 am - 7 pm I am available, please contact me via Amion (for emergencies) or Securechat (non urgent messages)  Between 7 pm - 7 am I am not available, please contact night coverage MD/APP via Amion

## 2023-11-09 NOTE — Telephone Encounter (Signed)
 Patient Product/process development scientist completed.    The patient is insured through [employer]. Patient has [employer], may use a copay card, and/or apply for patient assistance if available.    Ran test claim for Biktarvy  and Prezcobix  and Patient Locked into Specific Pharmacy    This test claim was processed through [facility]- copay amounts may vary at other pharmacies due to [employer], or as the patient moves through the different stages of their insurance plan.     Morgan Arab, CPHT Pharmacy Technician III Certified Patient Advocate [facility] Patient Advocate Team Direct Number: [phone]  Fax: [phone]

## 2023-11-10 ENCOUNTER — Other Ambulatory Visit: Payer: Self-pay

## 2023-11-10 DIAGNOSIS — B20 Human immunodeficiency virus [HIV] disease: Secondary | ICD-10-CM | POA: Insufficient documentation

## 2023-11-10 DIAGNOSIS — R569 Unspecified convulsions: Secondary | ICD-10-CM | POA: Diagnosis not present

## 2023-11-10 LAB — COMPREHENSIVE METABOLIC PANEL WITH GFR
ALT: 20 U/L (ref 0–44)
AST: 32 U/L (ref 15–41)
Albumin: 2.3 g/dL — ABNORMAL LOW (ref 3.5–5.0)
Alkaline Phosphatase: 43 U/L (ref 38–126)
Anion gap: 9 (ref 5–15)
BUN: 16 mg/dL (ref 6–20)
CO2: 24 mmol/L (ref 22–32)
Calcium: 9.4 mg/dL (ref 8.9–10.3)
Chloride: 101 mmol/L (ref 98–111)
Creatinine, Ser: 0.96 mg/dL (ref 0.61–1.24)
GFR, Estimated: >60 mL/min (ref >60)
Glucose, Bld: 116 mg/dL — ABNORMAL HIGH (ref 70–99)
Potassium: 5.2 mmol/L — ABNORMAL HIGH (ref 3.5–5.1)
Sodium: 134 mmol/L — ABNORMAL LOW (ref 135–145)
Total Bilirubin: 0.3 mg/dL (ref 0.0–1.2)
Total Protein: 7.1 g/dL (ref 6.5–8.1)

## 2023-11-10 LAB — CBC
HCT: 30.1 % — ABNORMAL LOW (ref 39.0–52.0)
Hemoglobin: 10.3 g/dL — ABNORMAL LOW (ref 13.0–17.0)
MCH: 28.6 pg (ref 26.0–34.0)
MCHC: 34.2 g/dL (ref 30.0–36.0)
MCV: 83.6 fL (ref 80.0–100.0)
Platelets: 328 10*3/uL (ref 150–400)
RBC: 3.6 MIL/uL — ABNORMAL LOW (ref 4.22–5.81)
RDW: 13.2 % (ref 11.5–15.5)
WBC: 12.1 10*3/uL — ABNORMAL HIGH (ref 4.0–10.5)
nRBC: 0 % (ref 0.0–0.2)

## 2023-11-10 LAB — MAGNESIUM: Magnesium: 2 mg/dL (ref 1.7–2.4)

## 2023-11-10 LAB — T.PALLIDUM AB, TOTAL: T Pallidum Abs: REACTIVE — AB

## 2023-11-10 NOTE — Progress Notes (Signed)
 [facility]  Date of Admission:  11/08/2023     Reason for Follow Up: Seizure-like activity (HCC)  Total days of antibiotics 4         ASSESSMENT:  Cory Russell level is elevated with PJP smear and Fungitell pending in the setting of presumed PJP vs CAP and seizure like activity. No further seizure like activity noted. Continues to require oxygen at 9 L/min.  Blood cultures have been without growth to date with negative respiratory panel.  Remains a risk for opportunistic infection given fully controlled virus in the setting of AIDS with CD4 count <35. Will continue current dose of sulfamethoxazole -trimethoprim  and prednisone for treatment of PJP.  Continue Biktarvy  and Prezcobix  for ART.  Had a long discussion regarding importance of taking medications as prescribed with continued concern for ability to swallow pills which has been the largest barrier. He is okay with Biktarvy  but has a lot of significant resistance from previous genotypes. Will optimize regimen and determine if okay for any type of injectable regimen as he does have resistance to rilpivirine . RPR titer 1:32 with previous treatment at 1: 256 and has not been sexually active since that time and will represent successful treatment. Currently on doxycycline and consider extending doxycycline treatment if needed. No rectal abscesses noted on imaging. Continue to monitor lab work for PJP. Standard/universal precautions. Remaining medical and supportive care per Internal Medicine.  PLAN:  Continue PJP treatment with Bactrim  and Prednisone while awaiting lab work. Continue Biktarvy  and Prezcobix  for ART Will evaluate resistance and optimize regimen to improve adherence and tolerance. Continue current dose of doxycycline. Standard/universal precautions.  Remaining medical and supportive care per Internal Medicine.   Principal Problem:   Seizure-like activity (HCC) Active Problems:   CAP (community  acquired pneumonia)   Protein-calorie malnutrition, severe   AIDS (acquired immune deficiency syndrome) (HCC)    bictegravir-emtricitabine -tenofovir  AF  1 tablet Oral Daily   darunavir -cobicistat   1 tablet Oral Q breakfast   enoxaparin  (LOVENOX ) injection  40 mg Subcutaneous Daily   feeding supplement  237 mL Oral TID BM   lidocaine   1 patch Transdermal Q24H   mirtazapine   15 mg Oral QHS   nystatin  5 mL Oral QID   predniSONE  40 mg Oral BID WC   Followed by   Cecily Cohen ON 11/13/2023] predniSONE  40 mg Oral Q breakfast   Followed by   Cecily Cohen ON 11/18/2023] predniSONE  20 mg Oral Q breakfast   sodium chloride  HYPERTONIC  4 mL Nebulization BID    SUBJECTIVE:  Afebrile overnight with no acute events.  Increase in white blood cell count to 12,100.  Following antibiotics with no adverse side effects.   No Known Allergies   Review of Systems: Review of Systems  Constitutional:  Negative for chills, fever and weight loss.  Respiratory:  Positive for cough and shortness of breath. Negative for wheezing.   Cardiovascular:  Negative for chest pain and leg swelling.  Gastrointestinal:  Negative for abdominal pain, constipation, diarrhea, nausea and vomiting.  Skin:  Negative for rash.      OBJECTIVE: Vitals:   11/10/23 0552 11/10/23 0744 11/10/23 0815 11/10/23 1204  BP: 113/71  120/71 109/72  Pulse: 75  91 95  Resp: 16 18 (!) 22 16  Temp: 97.7 F (36.5 C)  98.8 F (37.1 C) 99.8 F (37.7 C)  TempSrc: Oral  Oral Oral  SpO2: 95%  94% 100%  Weight:  Height:       Body mass index is 16.92 kg/m.  Physical Exam Constitutional:      General: He is not in acute distress.    Appearance: He is well-developed. He is ill-appearing.     Interventions: Nasal cannula in place.     Comments: Lying in bed with head of bed elevated; pleasant.   Cardiovascular:     Rate and Rhythm: Normal rate and regular rhythm.     Heart sounds: Normal heart sounds.  Pulmonary:     Effort:  Pulmonary effort is normal.     Breath sounds: Normal breath sounds.  Skin:    General: Skin is warm and dry.  Neurological:     Mental Status: He is alert and oriented to person, place, and time.     Lab Results Lab Results  Component Value Date   WBC 12.1 (H) 11/10/2023   HGB 10.3 (L) 11/10/2023   HCT 30.1 (L) 11/10/2023   MCV 83.6 11/10/2023   PLT 328 11/10/2023    Lab Results  Component Value Date   CREATININE 0.96 11/10/2023   BUN 16 11/10/2023   NA 134 (L) 11/10/2023   K 5.2 (H) 11/10/2023   CL 101 11/10/2023   CO2 24 11/10/2023    Lab Results  Component Value Date   ALT 20 11/10/2023   AST 32 11/10/2023   ALKPHOS 43 11/10/2023   BILITOT 0.3 11/10/2023     Microbiology: Recent Results (from the past 240 hours)  Culture, blood (Routine X 2) w Reflex to ID Panel     Status: None (Preliminary result)   Collection Time: 11/08/23  4:18 AM   Specimen: BLOOD  Result Value Ref Range Status   Specimen Description BLOOD BLOOD RIGHT ARM  Final   Special Requests   Final    BOTTLES DRAWN AEROBIC AND ANAEROBIC Blood Culture adequate volume   Culture   Final    NO GROWTH 2 DAYS Performed at [facility], [address], [place], [place] [address]    Report Status PENDING  Incomplete  Culture, blood (Routine X 2) w Reflex to ID Panel     Status: None (Preliminary result)   Collection Time: 11/08/23  4:22 AM   Specimen: BLOOD  Result Value Ref Range Status   Specimen Description BLOOD BLOOD RIGHT ARM  Final   Special Requests   Final    BOTTLES DRAWN AEROBIC AND ANAEROBIC Blood Culture adequate volume   Culture   Final    NO GROWTH 2 DAYS Performed at [facility], [address], [place], [place] [address]    Report Status PENDING  Incomplete  Respiratory (~20 pathogens) panel by PCR     Status: None   Collection Time: 11/08/23  8:42 AM   Specimen: Nasopharyngeal Swab; Respiratory  Result Value Ref Range Status   Adenovirus NOT DETECTED NOT DETECTED  Final   Coronavirus 229E NOT DETECTED NOT DETECTED Final    Comment: (NOTE) The Coronavirus on the Respiratory Panel, DOES NOT test for the novel  Coronavirus (2019 nCoV)    Coronavirus HKU1 NOT DETECTED NOT DETECTED Final   Coronavirus NL63 NOT DETECTED NOT DETECTED Final   Coronavirus OC43 NOT DETECTED NOT DETECTED Final   Metapneumovirus NOT DETECTED NOT DETECTED Final   Rhinovirus / Enterovirus NOT DETECTED NOT DETECTED Final   Influenza A NOT DETECTED NOT DETECTED Final   Influenza B NOT DETECTED NOT DETECTED Final   Parainfluenza Virus 1 NOT  DETECTED NOT DETECTED Final   Parainfluenza Virus 2 NOT DETECTED NOT DETECTED Final   Parainfluenza Virus 3 NOT DETECTED NOT DETECTED Final   Parainfluenza Virus 4 NOT DETECTED NOT DETECTED Final   Respiratory Syncytial Virus NOT DETECTED NOT DETECTED Final   Bordetella pertussis NOT DETECTED NOT DETECTED Final   Bordetella Parapertussis NOT DETECTED NOT DETECTED Final   Chlamydophila pneumoniae NOT DETECTED NOT DETECTED Final   Mycoplasma pneumoniae NOT DETECTED NOT DETECTED Final    Comment: Performed at [facility], [address], [place], [place] [address]    I have personally spent 34 minutes involved in face-to-face and non-face-to-face activities for this patient on the day of the visit. Professional time spent includes the following activities: Preparing to see the patient (review of tests), performing a medically appropriate examination, ordering medications, communicating with other health care professionals, documenting clinical information in the EMR, communicating results and counseling patient and family regarding medication and plan of care, and care coordination.   Greg Maury Bamba, NP [facility]  11/10/2023  2:25 PM

## 2023-11-10 NOTE — Progress Notes (Signed)
 Physical Therapy Treatment Patient Details Name: Cory Russell MRN: [id number] DOB: Sep 13, 1990 Today's Date: 11/10/2023   History of Present Illness Pt is a 33 yr old male who presented 11/08/23 with seizure-like activity. EEG pending. Pt was recently was dx with right lobe PNA. PMH: HIV, seizure disorder, HTN, rectal bleeding, depression.    PT Comments  Pt reports being worse.  I feel he has anxiety about mobilizing OOB due to the episodes of SOB/coughing he experiences up OOB mobilizing. Today, he deferred OOB.  Completed graded resistance exercise for U and LE's bil.  VSS during exercise.     If plan is discharge home, recommend the following: Other (comment);A little help with walking and/or transfers;A little help with bathing/dressing/bathroom (PRN for anything)   Can travel by private vehicle        Equipment Recommendations  None recommended by PT    Recommendations for Other Services       Precautions / Restrictions Precautions Recall of Precautions/Restrictions: Intact     Mobility  Bed Mobility Overal bed mobility: Modified Independent                  Transfers Overall transfer level: Modified independent                 General transfer comment: Declined OOB    Ambulation/Gait               General Gait Details: pt deferred   Stairs             Wheelchair Mobility     Tilt Bed    Modified Rankin (Stroke Patients Only)       Balance     Sitting balance-Leahy Scale: Normal                                      Communication Communication Communication: No apparent difficulties  Cognition Arousal: Alert Behavior During Therapy: Anxious   PT - Cognitive impairments: No apparent impairments                       PT - Cognition Comments: very anxious about possibility of SOB with mobility Following commands: Intact      Cueing Cueing Techniques: Verbal cues  Exercises Other  Exercises Other Exercises: IS use x5 with pt able to pull up to Other Exercises: hip/knee flexion/ext A ROM x5 and graded resistance x10 reps bil Other Exercises: bicep tricep presses with graded resistance x10 reps bil    General Comments General comments (skin integrity, edema, etc.): VSS on O2, but SpO2 dropped to 83% on RA during exercise.  Discussed why the IS and how best to handle it.  Asked pt to sit up this w/e in bed or even better the recliner in prep for Monday therapy.      Pertinent Vitals/Pain Pain Assessment Pain Assessment: 0-10 Pain Score: 8  Pain Location: chest, back, headache Pain Descriptors / Indicators: Discomfort, Sharp Pain Intervention(s): Monitored during session    Home Living                          Prior Function            PT Goals (current goals can now be found in the care plan section) Acute Rehab PT Goals PT Goal Formulation: With patient Time For Goal Achievement:  11/22/23 Potential to Achieve Goals: Good Progress towards PT goals: Progressing toward goals    Frequency    Min 3X/week      PT Plan      Co-evaluation              AM-PAC PT "6 Clicks" Mobility   Outcome Measure  Help needed turning from your back to your side while in a flat bed without using bedrails?: None Help needed moving from lying on your back to sitting on the side of a flat bed without using bedrails?: A Little Help needed moving to and from a bed to a chair (including a wheelchair)?: A Little Help needed standing up from a chair using your arms (e.g., wheelchair or bedside chair)?: A Little Help needed to walk in hospital room?: A Little Help needed climbing 3-5 steps with a railing? : A Little 6 Click Score: 19    End of Session Equipment Utilized During Treatment: Oxygen Activity Tolerance: Other (comment) (anxiety limited therapy) Patient left: in bed;with call bell/phone within reach;with family/visitor present Nurse  Communication: Mobility status PT Visit Diagnosis: Difficulty in walking, not elsewhere classified (R26.2);Pain     Time: 0981-1914 PT Time Calculation (min) (ACUTE ONLY): 19 min  Charges:    $Therapeutic Exercise: 8-22 mins PT General Charges $$ ACUTE PT VISIT: 1 Visit                     11/10/2023  Cory Russell., PT Acute Rehabilitation Services [phone]  (office)   Cory Russell Cory Russell 11/10/2023, 5:06 PM

## 2023-11-10 NOTE — Progress Notes (Signed)
 Pt's O2 sat dropped to low 70's on 3L per [place]. Pt had coughing spells upon entering the pt's room.  RT came and assess the pt and was placed on HFNC at 10L. Dr. Michell Ahumada updated. Will continue to monitor pt.    11/09/23 2300  Vitals  Temp 98.3 F (36.8 C)  Temp Source Oral  BP (!) 102/57  MAP (mmHg) 71  BP Location Left Arm  BP Method Automatic  Patient Position (if appropriate) Lying  Pulse Rate 78  Pulse Rate Source Monitor  ECG Heart Rate 78  Resp 16  MEWS COLOR  MEWS Score Color Green  Oxygen Therapy  SpO2 96 %  O2 Device HFNC  O2 Flow Rate (L/min) 10 L/min  Pain Assessment  Pain Scale 0-10  Pain Score 0  MEWS Score  MEWS Temp 0  MEWS Systolic 0  MEWS Pulse 0  MEWS RR 0  MEWS LOC 0  MEWS Score 0

## 2023-11-10 NOTE — Progress Notes (Signed)
 Occupational Therapy Treatment Patient Details Name: Cory Russell MRN: [id number] DOB: 20-Feb-1991 Today's Date: 11/10/2023   History of present illness Pt is a 33 yr old male who presented 11/08/23 with seizure-like activity. EEG pending. Pt was recently was dx with right lobe PNA. PMH: HIV, seizure disorder, HTN, rectal bleeding, depression.   OT comments  Patient received in supine and declining OOB and self care tasks. Patient stating 9/10 pain for back, chest, and head. Patient stating he recently had coughing episode which increased pain. Patient agreeable to patient education for energy conservation. Handout provided to patient and reviewed. Patient states he will attempt to get up with PT later today if he feel better. Acute OT to continue to follow to address established goals.       If plan is discharge home, recommend the following:  Assistance with cooking/housework;Assist for transportation   Equipment Recommendations  Tub/shower seat    Recommendations for Other Services      Precautions / Restrictions Precautions Precautions:  (watch O2) Recall of Precautions/Restrictions: Intact Restrictions Weight Bearing Restrictions Per Provider Order: No       Mobility Bed Mobility Overal bed mobility: Modified Independent                  Transfers                   General transfer comment: Declined OOB     Balance                                           ADL either performed or assessed with clinical judgement   ADL Overall ADL's : Needs assistance/impaired                                       General ADL Comments: patient declined OOB and self care    Extremity/Trunk Assessment              Vision       Perception     Praxis     Communication Communication Communication: No apparent difficulties   Cognition Arousal: Alert Behavior During Therapy: Anxious Cognition: No apparent impairments                                Following commands: Intact        Cueing   Cueing Techniques: Verbal cues  Exercises      Shoulder Instructions       General Comments Patient declined OOB and willing to address patient education for energy conservations strategies. Handout provided and reviewed with patient.    Pertinent Vitals/ Pain       Pain Assessment Pain Assessment: 0-10 Pain Score: 9  Pain Location: chest, back, headache Pain Descriptors / Indicators: Headache Pain Intervention(s): Limited activity within patient's tolerance, Monitored during session  Home Living                                          Prior Functioning/Environment              Frequency  Min 2X/week  Progress Toward Goals  OT Goals(current goals can now be found in the care plan section)  Progress towards OT goals: Not progressing toward goals - comment (limited by pain)  Acute Rehab OT Goals Patient Stated Goal: less pain OT Goal Formulation: With patient Time For Goal Achievement: 11/22/23 Potential to Achieve Goals: Good ADL Goals Pt Will Perform Tub/Shower Transfer: with modified independence;with supervision;ambulating Additional ADL Goal #1: Pt will voice 3 energy conservation stratigies with the return to home Additional ADL Goal #2: Pt will be able to complete 2 ADL tasks in standing with stable o2 readings.  Plan      Co-evaluation                 AM-PAC OT "6 Clicks" Daily Activity     Outcome Measure   Help from another person eating meals?: None Help from another person taking care of personal grooming?: None Help from another person toileting, which includes using toliet, bedpan, or urinal?: A Little Help from another person bathing (including washing, rinsing, drying)?: A Little Help from another person to put on and taking off regular upper body clothing?: None Help from another person to put on and taking off regular  lower body clothing?: None 6 Click Score: 22    End of Session    OT Visit Diagnosis: Unsteadiness on feet (R26.81);Pain Pain - Right/Left:  (abdomen and back)   Activity Tolerance Patient limited by fatigue;Patient limited by pain   Patient Left in bed;with call bell/phone within reach;with bed alarm set   Nurse Communication Mobility status        Time: 4098-1191 OT Time Calculation (min): 11 min  Charges: OT General Charges $OT Visit: 1 Visit OT Treatments $Therapeutic Activity: 8-22 mins  Anitra Barn, OTA Acute Rehabilitation Services  Office [phone]   Jovita Nipper 11/10/2023, 12:35 PM

## 2023-11-10 NOTE — Progress Notes (Signed)
 PROGRESS NOTE  Cory Russell ZOX:[id number] DOB: June 05, 1991 DOA: 11/08/2023 PCP: Liane Redman, MD   LOS: 2 days   Brief Narrative / Interim history: 33 year old male with history of HIV with nonadherence to medications, seizure disorder on valproic  acid, comes into the hospital with seizure-like activity.  He has not been taking his AEDs.  Workup showed residual pneumonia, he was transferred here for ID consultation.  Subjective / 24h Interval events: He tells me he is feeling worse today in terms of his respiratory status.  Has been having few coughing spells yesterday throughout the day, generally brought on by minimal activity such as moving from the bed to the chair  Assesement and Plan: Principal problem Sepsis due to pneumonia, immunosuppressed with underlying HIV/AIDS -ID consulted and following.  Patient antiretroviral therapy has been restarted - Has been placed on Unasyn, doxycycline, Bactrim  as well as prednisone given hypoxemia and concern for PJP.  Continue - Pneumocystis smear, Legionella urinary antigen all pending.  RVP negative - HIV RNA quantitative pending  Active problems Acute hypoxic respiratory failure due to hypoxemia-supplemental oxygen as needed remains pretty dyspneic with minimal ambulation.  Wean off to room air as tolerated.  Was started on prednisone for suspected PJP pneumonia - Still requiring significant oxygen at 8 L high flow  History of syphilis-RPR positive, troponin pallidum antibody pending.  Decision to treat deferred to ID  History of seizure disorder-this is likely in the setting of nonadherence to AEDs.  MRI of the brain with and without contrast is fairly unremarkable.  For now continue valproate  Severe protein calorie malnutrition-BMI 16.  On Remeron   Scheduled Meds:  bictegravir-emtricitabine -tenofovir  AF  1 tablet Oral Daily   darunavir -cobicistat   1 tablet Oral Q breakfast   enoxaparin  (LOVENOX ) injection  40 mg Subcutaneous Daily    feeding supplement  237 mL Oral TID BM   guaiFENesin  600 mg Oral BID   lidocaine   1 patch Transdermal Q24H   mirtazapine   15 mg Oral QHS   nystatin  5 mL Oral QID   predniSONE  40 mg Oral BID WC   Followed by   Cecily Cohen ON 11/13/2023] predniSONE  40 mg Oral Q breakfast   Followed by   Cecily Cohen ON 11/18/2023] predniSONE  20 mg Oral Q breakfast   sodium chloride  HYPERTONIC  4 mL Nebulization BID   Continuous Infusions:  cefTRIAXone  (ROCEPHIN )  IV 2 g (11/09/23 1652)   doxycycline (VIBRAMYCIN) IV 100 mg (11/09/23 2122)   sulfamethoxazole -trimethoprim  244.96 mg of trimethoprim  (11/10/23 0532)   valproate sodium  500 mg (11/09/23 2343)   PRN Meds:.benzonatate, ipratropium-albuterol, LORazepam, melatonin, oxyCODONE , polyethylene glycol, prochlorperazine  Current Outpatient Medications  Medication Instructions   amoxicillin  (AMOXIL ) 500 MG capsule Take by mouth.   amoxicillin -clavulanate (AUGMENTIN) 875-125 MG tablet 1 tablet, Oral, 2 times daily   bictegravir-emtricitabine -tenofovir  AF (BIKTARVY ) 50-200-25 MG TABS tablet 1 tablet, Oral, Daily   cetirizine (ZYRTEC) 10 MG tablet 1 tablet, Oral, Daily   doxycycline (VIBRAMYCIN) 100 mg, Oral, 2 times daily   mirtazapine  (REMERON ) 15 mg, Oral, Daily at bedtime   ondansetron  (ZOFRAN ) 4 mg, Oral, Daily PRN, Take 30 minutes prior to medication   predniSONE (DELTASONE) 5 MG tablet Take per taper instructions    sulfamethoxazole -trimethoprim  (BACTRIM  DS) 800-160 MG tablet 1 tablet, Oral, 3 times weekly   valproic  acid (DEPAKENE ) 250 mg, Oral, 4 times daily   zolpidem  (AMBIEN ) 5 mg, Oral, At bedtime PRN    Diet Orders (From admission, onward)     Start  Ordered   11/08/23 0103  Diet regular Room service appropriate? Yes; Fluid consistency: Thin  Diet effective now       Question Answer Comment  Room service appropriate? Yes   Fluid consistency: Thin      11/08/23 0102            DVT prophylaxis: enoxaparin  (LOVENOX ) injection 40 mg  Start: 11/08/23 1000   Lab Results  Component Value Date   PLT 328 11/10/2023      Code Status: Full Code  Family Communication: No family at bedside  Status is: Inpatient Remains inpatient appropriate because: Severity of illness   Level of care: Progressive  Consultants:  ID  Objective: Vitals:   11/09/23 2300 11/10/23 0552 11/10/23 0744 11/10/23 0815  BP: (!) 102/57 113/71  120/71  Pulse: 78 75  91  Resp: 16 16 18  (!) 22  Temp: 98.3 F (36.8 C) 97.7 F (36.5 C)  98.8 F (37.1 C)  TempSrc: Oral Oral  Oral  SpO2: 96% 95%  94%  Weight:      Height:        Intake/Output Summary (Last 24 hours) at 11/10/2023 0902 Last data filed at 11/10/2023 0533 Gross per 24 hour  Intake 3801.55 ml  Output 1850 ml  Net 1951.55 ml   Wt Readings from Last 3 Encounters:  11/08/23 49 kg  10/03/23 54.4 kg  04/12/23 57.3 kg    Examination: Constitutional: NAD Eyes: lids and conjunctivae normal, no scleral icterus ENMT: mmm Neck: normal, supple Respiratory: clear to auscultation bilaterally, no wheezing, no crackles.  Cardiovascular: Regular rate and rhythm, no murmurs / rubs / gallops.  Abdomen: soft, no distention, no tenderness. Bowel sounds positive.   Data Reviewed: I have independently reviewed following labs and imaging studies   CBC Recent Labs  Lab 11/08/23 0418 11/09/23 0416 11/10/23 0506  WBC 6.9 6.9 12.1*  HGB 10.1* 10.4* 10.3*  HCT 29.5* 31.3* 30.1*  PLT 296 328 328  MCV 83.1 84.6 83.6  MCH 28.5 28.1 28.6  MCHC 34.2 33.2 34.2  RDW 13.1 13.4 13.2  LYMPHSABS 0.6*  --   --   MONOABS 0.6  --   --   EOSABS 0.0  --   --   BASOSABS 0.0  --   --     Recent Labs  Lab 11/08/23 0418 11/08/23 0811 11/09/23 0416 11/10/23 0506  NA 131*  --  136 134*  K 4.4  --  4.7 5.2*  CL 96*  --  103 101  CO2 25  --  23 24  GLUCOSE 118*  --  109* 116*  BUN 16  --  16 16  CREATININE 0.83  --  0.92 0.96  CALCIUM 8.9  --  9.2 9.4  AST 30  --  35 32  ALT 18  --  24 20   ALKPHOS 42  --  46 43  BILITOT 0.5  --  0.5 0.3  ALBUMIN 2.5*  --  2.3* 2.3*  MG 1.7  --  2.0 2.0  PROCALCITON <0.10  --   --   --   LATICACIDVEN 1.2 0.8  --   --     ------------------------------------------------------------------------------------------------------------------ No results for input(s): "CHOL", "HDL", "LDLCALC", "TRIG", "CHOLHDL", "LDLDIRECT" in the last 72 hours.  No results found for: "HGBA1C" ------------------------------------------------------------------------------------------------------------------ No results for input(s): "TSH", "T4TOTAL", "T3FREE", "THYROIDAB" in the last 72 hours.  Invalid input(s): "FREET3"  Cardiac Enzymes No results for input(s): "CKMB", "TROPONINI", "MYOGLOBIN" in the last  168 hours.  Invalid input(s): "CK" ------------------------------------------------------------------------------------------------------------------ No results found for: "BNP"  CBG: No results for input(s): "GLUCAP" in the last 168 hours.  Recent Results (from the past 240 hours)  Culture, blood (Routine X 2) w Reflex to ID Panel     Status: None (Preliminary result)   Collection Time: 11/08/23  4:18 AM   Specimen: BLOOD  Result Value Ref Range Status   Specimen Description BLOOD BLOOD RIGHT ARM  Final   Special Requests   Final    BOTTLES DRAWN AEROBIC AND ANAEROBIC Blood Culture adequate volume   Culture   Final    NO GROWTH 2 DAYS Performed at [facility], [address], [place], [place] [address]    Report Status PENDING  Incomplete  Culture, blood (Routine X 2) w Reflex to ID Panel     Status: None (Preliminary result)   Collection Time: 11/08/23  4:22 AM   Specimen: BLOOD  Result Value Ref Range Status   Specimen Description BLOOD BLOOD RIGHT ARM  Final   Special Requests   Final    BOTTLES DRAWN AEROBIC AND ANAEROBIC Blood Culture adequate volume   Culture   Final    NO GROWTH 2 DAYS Performed at [facility], [address], [place], [place] [address]    Report Status PENDING  Incomplete  Respiratory (~20 pathogens) panel by PCR     Status: None   Collection Time: 11/08/23  8:42 AM   Specimen: Nasopharyngeal Swab; Respiratory  Result Value Ref Range Status   Adenovirus NOT DETECTED NOT DETECTED Final   Coronavirus 229E NOT DETECTED NOT DETECTED Final    Comment: (NOTE) The Coronavirus on the Respiratory Panel, DOES NOT test for the novel  Coronavirus (2019 nCoV)    Coronavirus HKU1 NOT DETECTED NOT DETECTED Final   Coronavirus NL63 NOT DETECTED NOT DETECTED Final   Coronavirus OC43 NOT DETECTED NOT DETECTED Final   Metapneumovirus NOT DETECTED NOT DETECTED Final   Rhinovirus / Enterovirus NOT DETECTED NOT DETECTED Final   Influenza A NOT DETECTED NOT DETECTED Final   Influenza B NOT DETECTED NOT DETECTED Final   Parainfluenza Virus 1 NOT DETECTED NOT DETECTED Final   Parainfluenza Virus 2 NOT DETECTED NOT DETECTED Final   Parainfluenza Virus 3 NOT DETECTED NOT DETECTED Final   Parainfluenza Virus 4 NOT DETECTED NOT DETECTED Final   Respiratory Syncytial Virus NOT DETECTED NOT DETECTED Final   Bordetella pertussis NOT DETECTED NOT DETECTED Final   Bordetella Parapertussis NOT DETECTED NOT DETECTED Final   Chlamydophila pneumoniae NOT DETECTED NOT DETECTED Final   Mycoplasma pneumoniae NOT DETECTED NOT DETECTED Final    Comment: Performed at [facility], [address], [place], [place] [address]     Radiology Studies: CT PELVIS W CONTRAST Result Date: 11/09/2023 CLINICAL DATA:  Evaluate for rectal abscess EXAM: CT PELVIS WITH CONTRAST TECHNIQUE: Multidetector CT imaging of the pelvis was performed using the standard protocol following the bolus administration of intravenous contrast. RADIATION DOSE REDUCTION: This exam was performed according to the departmental dose-optimization program which includes automated exposure control, adjustment of the mA and/or kV according to patient size and/or  use of iterative reconstruction technique. CONTRAST:  75mL OMNIPAQUE IOHEXOL 350 MG/ML SOLN COMPARISON:  CT abdomen and pelvis 03/16/2010 FINDINGS: Urinary Tract:  No abnormality visualized. Bowel: No dilated bowel loops are seen. The left perianal wall appears slightly thickened with a single focus of fat. No enhancing fluid collection identified. No surrounding  inflammatory stranding. Vascular/Lymphatic: No pathologically enlarged lymph nodes. No significant vascular abnormality seen. Reproductive:  Prostate gland within normal limits. Other: There is no ascites or focal abdominal wall hernia. No focal abscess identified. Specifically, no perianal or perirectal abscess identified. Musculoskeletal: No suspicious bone lesions identified. IMPRESSION: 1. No perianal or perirectal abscess identified. 2. The left perianal wall appears slightly thickened with a single focus of fat. This may be related to prior inflammation. Electronically Signed   By: Tyron Gallon M.D.   On: 11/09/2023 18:03     Kathlen Para, MD, PhD [facility]  Between 7 am - 7 pm I am available, please contact me via Amion (for emergencies) or Securechat (non urgent messages)  Between 7 pm - 7 am I am not available, please contact night coverage MD/APP via Amion

## 2023-11-10 NOTE — Plan of Care (Signed)

## 2023-11-11 DIAGNOSIS — R569 Unspecified convulsions: Secondary | ICD-10-CM | POA: Diagnosis not present

## 2023-11-11 LAB — BASIC METABOLIC PANEL WITH GFR
Anion gap: 8 (ref 5–15)
BUN: 19 mg/dL (ref 6–20)
CO2: 25 mmol/L (ref 22–32)
Calcium: 8.9 mg/dL (ref 8.9–10.3)
Chloride: 97 mmol/L — ABNORMAL LOW (ref 98–111)
Creatinine, Ser: 1.06 mg/dL (ref 0.61–1.24)
GFR, Estimated: >60 mL/min (ref >60)
Glucose, Bld: 128 mg/dL — ABNORMAL HIGH (ref 70–99)
Potassium: 5.3 mmol/L — ABNORMAL HIGH (ref 3.5–5.1)
Sodium: 130 mmol/L — ABNORMAL LOW (ref 135–145)

## 2023-11-11 MED ORDER — SODIUM ZIRCONIUM CYCLOSILICATE 10 G PO PACK
10.0000 g | PACK | Freq: Once | ORAL | Status: AC
  Administered 2023-11-11: 10 g via ORAL
  Filled 2023-11-11: qty 1

## 2023-11-11 MED ORDER — ORAL CARE MOUTH RINSE
15.0000 mL | OROMUCOSAL | Status: DC | PRN
Start: 2023-11-11 — End: 2023-11-26

## 2023-11-11 MED ORDER — ENOXAPARIN SODIUM 40 MG/0.4ML IJ SOSY
40.0000 mg | PREFILLED_SYRINGE | Freq: Every day | INTRAMUSCULAR | Status: DC
  Filled 2023-11-11 (×4): qty 0.4

## 2023-11-11 NOTE — Plan of Care (Signed)

## 2023-11-11 NOTE — Progress Notes (Addendum)
 PROGRESS NOTE  Cory Russell ZOX:[id number] DOB: [birth year] DOA: 11/08/2023 PCP: Liane Redman, MD   LOS: 3 days   Brief Narrative / Interim history: 33 year old male with history of HIV with nonadherence to medications, seizure disorder on valproic  acid, comes into the hospital with seizure-like activity.  He has not been taking his AEDs.  Workup showed residual pneumonia, he was transferred here for ID consultation.  Subjective / 24h Interval events: Felt better this morning, but had some activity in the morning and now feels wiped  Assesement and Plan: Principal problem Sepsis due to pneumonia, immunosuppressed with underlying HIV/AIDS -ID consulted and following.  Patient antiretroviral therapy has been restarted - Has been placed on Unasyn , doxycycline , Bactrim  as well as prednisone  given hypoxemia and concern for PJP.  Continue - Pneumocystis smear, Legionella urinary antigen all pending.  RVP negative - HIV RNA quantitative pending  Active problems Acute hypoxic respiratory failure due to hypoxemia-supplemental oxygen as needed remains pretty dyspneic with minimal ambulation.  Wean off to room air as tolerated.  Was started on prednisone  for suspected PJP pneumonia - Still requiring significant supplemental oxygen, but overall better on 6 L  History of syphilis-RPR positive, troponin pallidum antibody reactive.  Decision to treat deferred to ID  History of seizure disorder-this is likely in the setting of nonadherence to AEDs.  MRI of the brain with and without contrast is fairly unremarkable.  For now continue valproate  Hyponatremia-in the setting of HIV and lung processes  Hyperkalemia-Lokelma x 1.  Severe protein calorie malnutrition-BMI 16.  On Remeron   Scheduled Meds:  bictegravir-emtricitabine -tenofovir  AF  1 tablet Oral Daily   darunavir -cobicistat   1 tablet Oral Q breakfast   enoxaparin  (LOVENOX ) injection  40 mg Subcutaneous Daily   feeding supplement  237 mL  Oral TID BM   lidocaine   1 patch Transdermal Q24H   mirtazapine   15 mg Oral QHS   nystatin   5 mL Oral QID   predniSONE   40 mg Oral BID WC   Followed by   Cecily Cohen ON 11/13/2023] predniSONE   40 mg Oral Q breakfast   Followed by   Cecily Cohen ON 11/18/2023] predniSONE   20 mg Oral Q breakfast   Continuous Infusions:  doxycycline  (VIBRAMYCIN ) IV 100 mg (11/11/23 0925)   sulfamethoxazole -trimethoprim  244.96 mg of trimethoprim  (11/11/23 0619)   valproate sodium  500 mg (11/11/23 0925)   PRN Meds:.benzonatate , ipratropium-albuterol , LORazepam , melatonin, oxyCODONE , polyethylene glycol, prochlorperazine   Current Outpatient Medications  Medication Instructions   amoxicillin  (AMOXIL ) 500 MG capsule Take by mouth.   bictegravir-emtricitabine -tenofovir  AF (BIKTARVY ) 50-200-25 MG TABS tablet 1 tablet, Oral, Daily   cetirizine (ZYRTEC) 10 MG tablet 1 tablet, Oral, Daily   mirtazapine  (REMERON ) 15 mg, Oral, Daily at bedtime   ondansetron  (ZOFRAN ) 4 mg, Oral, Daily PRN, Take 30 minutes prior to medication   predniSONE  (DELTASONE ) 5 MG tablet Take per taper instructions    sulfamethoxazole -trimethoprim  (BACTRIM  DS) 800-160 MG tablet 1 tablet, Oral, 3 times weekly   valproic  acid (DEPAKENE ) 250 mg, Oral, 4 times daily   zolpidem  (AMBIEN ) 5 mg, Oral, At bedtime PRN    Diet Orders (From admission, onward)     Start     Ordered   11/08/23 0103  Diet regular Room service appropriate? Yes; Fluid consistency: Thin  Diet effective now       Question Answer Comment  Room service appropriate? Yes   Fluid consistency: Thin      11/08/23 0102            DVT  prophylaxis: enoxaparin  (LOVENOX ) injection 40 mg Start: 11/08/23 1000   Lab Results  Component Value Date   PLT 328 11/10/2023      Code Status: Full Code  Family Communication: No family at bedside  Status is: Inpatient Remains inpatient appropriate because: Severity of illness   Level of care: Progressive  Consultants:   ID  Objective: Vitals:   11/10/23 1645 11/10/23 1714 11/10/23 2116 11/11/23 0748  BP: 118/84 136/89 113/68 117/83  Pulse: (!) 110 87 83 80  Resp: 20  18 20   Temp: 98.6 F (37 C)  98.8 F (37.1 C) 97.8 F (36.6 C)  TempSrc: Oral  Oral Oral  SpO2: 93% 94% 95% 95%  Weight:      Height:        Intake/Output Summary (Last 24 hours) at 11/11/2023 1006 Last data filed at 11/10/2023 2236 Gross per 24 hour  Intake --  Output 1800 ml  Net -1800 ml   Wt Readings from Last 3 Encounters:  11/08/23 49 kg  10/03/23 54.4 kg  04/12/23 57.3 kg    Examination: Constitutional: NAD Eyes: lids and conjunctivae normal, no scleral icterus ENMT: mmm Neck: normal, supple Respiratory: clear to auscultation bilaterally, no wheezing, no crackles. Normal respiratory effort.  Cardiovascular: Regular rate and rhythm, no murmurs / rubs / gallops. No LE edema. Abdomen: soft, no distention, no tenderness. Bowel sounds positive.   Data Reviewed: I have independently reviewed following labs and imaging studies   CBC Recent Labs  Lab 11/08/23 0418 11/09/23 0416 11/10/23 0506  WBC 6.9 6.9 12.1*  HGB 10.1* 10.4* 10.3*  HCT 29.5* 31.3* 30.1*  PLT 296 328 328  MCV 83.1 84.6 83.6  MCH 28.5 28.1 28.6  MCHC 34.2 33.2 34.2  RDW 13.1 13.4 13.2  LYMPHSABS 0.6*  --   --   MONOABS 0.6  --   --   EOSABS 0.0  --   --   BASOSABS 0.0  --   --     Recent Labs  Lab 11/08/23 0418 11/08/23 0811 11/09/23 0416 11/10/23 0506 11/11/23 0349  NA 131*  --  136 134* 130*  K 4.4  --  4.7 5.2* 5.3*  CL 96*  --  103 101 97*  CO2 25  --  23 24 25   GLUCOSE 118*  --  109* 116* 128*  BUN 16  --  16 16 19   CREATININE 0.83  --  0.92 0.96 1.06  CALCIUM 8.9  --  9.2 9.4 8.9  AST 30  --  35 32  --   ALT 18  --  24 20  --   ALKPHOS 42  --  46 43  --   BILITOT 0.5  --  0.5 0.3  --   ALBUMIN 2.5*  --  2.3* 2.3*  --   MG 1.7  --  2.0 2.0  --   PROCALCITON <0.10  --   --   --   --   LATICACIDVEN 1.2 0.8  --   --   --      ------------------------------------------------------------------------------------------------------------------ No results for input(s): "CHOL", "HDL", "LDLCALC", "TRIG", "CHOLHDL", "LDLDIRECT" in the last 72 hours.  No results found for: "HGBA1C" ------------------------------------------------------------------------------------------------------------------ No results for input(s): "TSH", "T4TOTAL", "T3FREE", "THYROIDAB" in the last 72 hours.  Invalid input(s): "FREET3"  Cardiac Enzymes No results for input(s): "CKMB", "TROPONINI", "MYOGLOBIN" in the last 168 hours.  Invalid input(s): "CK" ------------------------------------------------------------------------------------------------------------------ No results found for: "BNP"  CBG: No results for input(s): "GLUCAP" in  the last 168 hours.  Recent Results (from the past 240 hours)  Culture, blood (Routine X 2) w Reflex to ID Panel     Status: None (Preliminary result)   Collection Time: 11/08/23  4:18 AM   Specimen: BLOOD  Result Value Ref Range Status   Specimen Description BLOOD BLOOD RIGHT ARM  Final   Special Requests   Final    BOTTLES DRAWN AEROBIC AND ANAEROBIC Blood Culture adequate volume   Culture   Final    NO GROWTH 3 DAYS Performed at [facility], [address], [place], [place] [address]    Report Status PENDING  Incomplete  Culture, blood (Routine X 2) w Reflex to ID Panel     Status: None (Preliminary result)   Collection Time: 11/08/23  4:22 AM   Specimen: BLOOD  Result Value Ref Range Status   Specimen Description BLOOD BLOOD RIGHT ARM  Final   Special Requests   Final    BOTTLES DRAWN AEROBIC AND ANAEROBIC Blood Culture adequate volume   Culture   Final    NO GROWTH 3 DAYS Performed at [facility], [address], [place], [place] [address]    Report Status PENDING  Incomplete  Respiratory (~20 pathogens) panel by PCR     Status: None   Collection Time: 11/08/23  8:42 AM    Specimen: Nasopharyngeal Swab; Respiratory  Result Value Ref Range Status   Adenovirus NOT DETECTED NOT DETECTED Final   Coronavirus 229E NOT DETECTED NOT DETECTED Final    Comment: (NOTE) The Coronavirus on the Respiratory Panel, DOES NOT test for the novel  Coronavirus (2019 nCoV)    Coronavirus HKU1 NOT DETECTED NOT DETECTED Final   Coronavirus NL63 NOT DETECTED NOT DETECTED Final   Coronavirus OC43 NOT DETECTED NOT DETECTED Final   Metapneumovirus NOT DETECTED NOT DETECTED Final   Rhinovirus / Enterovirus NOT DETECTED NOT DETECTED Final   Influenza A NOT DETECTED NOT DETECTED Final   Influenza B NOT DETECTED NOT DETECTED Final   Parainfluenza Virus 1 NOT DETECTED NOT DETECTED Final   Parainfluenza Virus 2 NOT DETECTED NOT DETECTED Final   Parainfluenza Virus 3 NOT DETECTED NOT DETECTED Final   Parainfluenza Virus 4 NOT DETECTED NOT DETECTED Final   Respiratory Syncytial Virus NOT DETECTED NOT DETECTED Final   Bordetella pertussis NOT DETECTED NOT DETECTED Final   Bordetella Parapertussis NOT DETECTED NOT DETECTED Final   Chlamydophila pneumoniae NOT DETECTED NOT DETECTED Final   Mycoplasma pneumoniae NOT DETECTED NOT DETECTED Final    Comment: Performed at [facility], [address], [place], [place] [address]     Radiology Studies: No results found.    Kathlen Para, MD, PhD [facility]  Between 7 am - 7 pm I am available, please contact me via Amion (for emergencies) or Securechat (non urgent messages)  Between 7 pm - 7 am I am not available, please contact night coverage MD/APP via Amion

## 2023-11-11 NOTE — Plan of Care (Signed)

## 2023-11-12 ENCOUNTER — Inpatient Hospital Stay (HOSPITAL_COMMUNITY)

## 2023-11-12 DIAGNOSIS — R569 Unspecified convulsions: Secondary | ICD-10-CM | POA: Diagnosis not present

## 2023-11-12 LAB — BASIC METABOLIC PANEL WITH GFR
Anion gap: 6 (ref 5–15)
BUN: 22 mg/dL — ABNORMAL HIGH (ref 6–20)
CO2: 26 mmol/L (ref 22–32)
Calcium: 8.9 mg/dL (ref 8.9–10.3)
Chloride: 97 mmol/L — ABNORMAL LOW (ref 98–111)
Creatinine, Ser: 1.12 mg/dL (ref 0.61–1.24)
GFR, Estimated: >60 mL/min (ref >60)
Glucose, Bld: 140 mg/dL — ABNORMAL HIGH (ref 70–99)
Potassium: 4.9 mmol/L (ref 3.5–5.1)
Sodium: 129 mmol/L — ABNORMAL LOW (ref 135–145)

## 2023-11-12 MED ORDER — FUROSEMIDE 10 MG/ML IJ SOLN
40.0000 mg | Freq: Once | INTRAMUSCULAR | Status: AC
  Administered 2023-11-12: 40 mg via INTRAVENOUS
  Filled 2023-11-12: qty 4

## 2023-11-12 MED ORDER — DOXYCYCLINE HYCLATE 100 MG PO TABS
100.0000 mg | ORAL_TABLET | Freq: Two times a day (BID) | ORAL | Status: DC
  Administered 2023-11-12 – 2023-11-15 (×7): 100 mg via ORAL
  Filled 2023-11-12 (×7): qty 1

## 2023-11-12 NOTE — Progress Notes (Signed)
 PROGRESS NOTE  Cory Russell NWG:[id number] DOB: 1990-08-16 DOA: 11/08/2023 PCP: Liane Redman, MD   LOS: 4 days   Brief Narrative / Interim history: 33 year old male with history of HIV with nonadherence to medications, seizure disorder on valproic  acid, comes into the hospital with seizure-like activity.  He has not been taking his AEDs.  Workup showed residual pneumonia, he was transferred here for ID consultation.  Subjective / 24h Interval events: Reports ongoing shortness of breath and coughing spells  Assesement and Plan: Principal problem Sepsis due to pneumonia, immunosuppressed with underlying HIV/AIDS -ID consulted and following.  Patient antiretroviral therapy has been restarted - Has been placed on Unasyn , doxycycline , Bactrim  as well as prednisone  given hypoxemia and concern for PJP.  Continue - Pneumocystis smear, Legionella urinary antigen all pending.  RVP negative - HIV viral load elevated at 306K  Active problems Acute hypoxic respiratory failure due to hypoxemia-supplemental oxygen as needed remains pretty dyspneic with minimal ambulation.  Wean off to room air as tolerated.  Was started on prednisone  for suspected PJP pneumonia - Still requiring significant supplemental oxygen, - Chest x-ray today with some fluid overload, will dose Lasix x 1  History of syphilis-RPR positive, troponin pallidum antibody reactive.  Decision to treat deferred to ID  History of seizure disorder-this is likely in the setting of nonadherence to AEDs.  MRI of the brain with and without contrast is fairly unremarkable.  For now continue valproate  Hyponatremia-in the setting of HIV and lung processes  Hyperkalemia-Lokelma x 1.  Severe protein calorie malnutrition-BMI 16.  On Remeron   Scheduled Meds:  bictegravir-emtricitabine -tenofovir  AF  1 tablet Oral Daily   darunavir -cobicistat   1 tablet Oral Q breakfast   enoxaparin  (LOVENOX ) injection  40 mg Subcutaneous Daily   feeding  supplement  237 mL Oral TID BM   furosemide  40 mg Intravenous Once   lidocaine   1 patch Transdermal Q24H   mirtazapine   15 mg Oral QHS   nystatin   5 mL Oral QID   predniSONE   40 mg Oral BID WC   Followed by   Cecily Cohen ON 11/13/2023] predniSONE   40 mg Oral Q breakfast   Followed by   Cecily Cohen ON 11/18/2023] predniSONE   20 mg Oral Q breakfast   Continuous Infusions:  doxycycline  (VIBRAMYCIN ) IV 100 mg (11/12/23 0809)   sulfamethoxazole -trimethoprim  244.96 mg of trimethoprim  (11/12/23 0456)   valproate sodium  500 mg (11/12/23 1034)   PRN Meds:.benzonatate , ipratropium-albuterol , LORazepam , melatonin, mouth rinse, oxyCODONE , polyethylene glycol, prochlorperazine   Current Outpatient Medications  Medication Instructions   amoxicillin  (AMOXIL ) 500 MG capsule Take by mouth.   bictegravir-emtricitabine -tenofovir  AF (BIKTARVY ) 50-200-25 MG TABS tablet 1 tablet, Oral, Daily   cetirizine (ZYRTEC) 10 MG tablet 1 tablet, Oral, Daily   mirtazapine  (REMERON ) 15 mg, Oral, Daily at bedtime   ondansetron  (ZOFRAN ) 4 mg, Oral, Daily PRN, Take 30 minutes prior to medication   predniSONE  (DELTASONE ) 5 MG tablet Take per taper instructions    sulfamethoxazole -trimethoprim  (BACTRIM  DS) 800-160 MG tablet 1 tablet, Oral, 3 times weekly   valproic  acid (DEPAKENE ) 250 mg, Oral, 4 times daily   zolpidem  (AMBIEN ) 5 mg, Oral, At bedtime PRN    Diet Orders (From admission, onward)     Start     Ordered   11/08/23 0103  Diet regular Room service appropriate? Yes; Fluid consistency: Thin  Diet effective now       Question Answer Comment  Room service appropriate? Yes   Fluid consistency: Thin      11/08/23 0102  DVT prophylaxis: enoxaparin  (LOVENOX ) injection 40 mg Start: 11/12/23 1000   Lab Results  Component Value Date   PLT 328 11/10/2023      Code Status: Full Code  Family Communication: No family at bedside  Status is: Inpatient Remains inpatient appropriate because: Severity of  illness   Level of care: Progressive  Consultants:  ID  Objective: Vitals:   11/11/23 1723 11/11/23 2000 11/12/23 0504 11/12/23 0840  BP:  124/86 118/74 138/83  Pulse: 82   (!) 103  Resp: (!) 22 20 20    Temp:    98.8 F (37.1 C)  TempSrc:    Oral  SpO2: 95%  95% 97%  Weight:      Height:        Intake/Output Summary (Last 24 hours) at 11/12/2023 1108 Last data filed at 11/12/2023 1031 Gross per 24 hour  Intake 237 ml  Output 1150 ml  Net -913 ml   Wt Readings from Last 3 Encounters:  11/08/23 49 kg  10/03/23 54.4 kg  04/12/23 57.3 kg    Examination: Constitutional: NAD Eyes: lids and conjunctivae normal, no scleral icterus ENMT: mmm Neck: normal, supple Respiratory: Faint rhonchi bilaterally, no crackles Cardiovascular: Regular rate and rhythm, no murmurs / rubs / gallops. No LE edema. Abdomen: soft, no distention, no tenderness. Bowel sounds positive.   Data Reviewed: I have independently reviewed following labs and imaging studies   CBC Recent Labs  Lab 11/08/23 0418 11/09/23 0416 11/10/23 0506  WBC 6.9 6.9 12.1*  HGB 10.1* 10.4* 10.3*  HCT 29.5* 31.3* 30.1*  PLT 296 328 328  MCV 83.1 84.6 83.6  MCH 28.5 28.1 28.6  MCHC 34.2 33.2 34.2  RDW 13.1 13.4 13.2  LYMPHSABS 0.6*  --   --   MONOABS 0.6  --   --   EOSABS 0.0  --   --   BASOSABS 0.0  --   --     Recent Labs  Lab 11/08/23 0418 11/08/23 0811 11/09/23 0416 11/10/23 0506 11/11/23 0349 11/12/23 0358  NA 131*  --  136 134* 130* 129*  K 4.4  --  4.7 5.2* 5.3* 4.9  CL 96*  --  103 101 97* 97*  CO2 25  --  23 24 25 26   GLUCOSE 118*  --  109* 116* 128* 140*  BUN 16  --  16 16 19  22*  CREATININE 0.83  --  0.92 0.96 1.06 1.12  CALCIUM 8.9  --  9.2 9.4 8.9 8.9  AST 30  --  35 32  --   --   ALT 18  --  24 20  --   --   ALKPHOS 42  --  46 43  --   --   BILITOT 0.5  --  0.5 0.3  --   --   ALBUMIN 2.5*  --  2.3* 2.3*  --   --   MG 1.7  --  2.0 2.0  --   --   PROCALCITON <0.10  --   --   --    --   --   LATICACIDVEN 1.2 0.8  --   --   --   --     ------------------------------------------------------------------------------------------------------------------ No results for input(s): "CHOL", "HDL", "LDLCALC", "TRIG", "CHOLHDL", "LDLDIRECT" in the last 72 hours.  No results found for: "HGBA1C" ------------------------------------------------------------------------------------------------------------------ No results for input(s): "TSH", "T4TOTAL", "T3FREE", "THYROIDAB" in the last 72 hours.  Invalid input(s): "FREET3"  Cardiac Enzymes No results for input(s): "CKMB", "TROPONINI", "  MYOGLOBIN" in the last 168 hours.  Invalid input(s): "CK" ------------------------------------------------------------------------------------------------------------------ No results found for: "BNP"  CBG: No results for input(s): "GLUCAP" in the last 168 hours.  Recent Results (from the past 240 hours)  Culture, blood (Routine X 2) w Reflex to ID Panel     Status: None (Preliminary result)   Collection Time: 11/08/23  4:18 AM   Specimen: BLOOD  Result Value Ref Range Status   Specimen Description BLOOD BLOOD RIGHT ARM  Final   Special Requests   Final    BOTTLES DRAWN AEROBIC AND ANAEROBIC Blood Culture adequate volume   Culture   Final    NO GROWTH 3 DAYS Performed at [facility], [address], [place], [place] [address]    Report Status PENDING  Incomplete  Culture, blood (Routine X 2) w Reflex to ID Panel     Status: None (Preliminary result)   Collection Time: 11/08/23  4:22 AM   Specimen: BLOOD  Result Value Ref Range Status   Specimen Description BLOOD BLOOD RIGHT ARM  Final   Special Requests   Final    BOTTLES DRAWN AEROBIC AND ANAEROBIC Blood Culture adequate volume   Culture   Final    NO GROWTH 3 DAYS Performed at [facility], [address], [place], [place] [address]    Report Status PENDING  Incomplete  Respiratory (~20 pathogens) panel by PCR      Status: None   Collection Time: 11/08/23  8:42 AM   Specimen: Nasopharyngeal Swab; Respiratory  Result Value Ref Range Status   Adenovirus NOT DETECTED NOT DETECTED Final   Coronavirus 229E NOT DETECTED NOT DETECTED Final    Comment: (NOTE) The Coronavirus on the Respiratory Panel, DOES NOT test for the novel  Coronavirus (2019 nCoV)    Coronavirus HKU1 NOT DETECTED NOT DETECTED Final   Coronavirus NL63 NOT DETECTED NOT DETECTED Final   Coronavirus OC43 NOT DETECTED NOT DETECTED Final   Metapneumovirus NOT DETECTED NOT DETECTED Final   Rhinovirus / Enterovirus NOT DETECTED NOT DETECTED Final   Influenza A NOT DETECTED NOT DETECTED Final   Influenza B NOT DETECTED NOT DETECTED Final   Parainfluenza Virus 1 NOT DETECTED NOT DETECTED Final   Parainfluenza Virus 2 NOT DETECTED NOT DETECTED Final   Parainfluenza Virus 3 NOT DETECTED NOT DETECTED Final   Parainfluenza Virus 4 NOT DETECTED NOT DETECTED Final   Respiratory Syncytial Virus NOT DETECTED NOT DETECTED Final   Bordetella pertussis NOT DETECTED NOT DETECTED Final   Bordetella Parapertussis NOT DETECTED NOT DETECTED Final   Chlamydophila pneumoniae NOT DETECTED NOT DETECTED Final   Mycoplasma pneumoniae NOT DETECTED NOT DETECTED Final    Comment: Performed at [facility], [address], [place], [place] [address]     Radiology Studies: DG CHEST PORT 1 VIEW Result Date: 11/12/2023 CLINICAL DATA:  Pneumonia. EXAM: PORTABLE CHEST 1 VIEW COMPARISON:  Chest radiograph dated 11/08/2023. FINDINGS: No focal consolidation, pleural effusion or pneumothorax. There is mild diffuse interstitial prominence which may represent mild vascular congestion. The cardiac silhouette is within normal limits. No acute osseous pathology. IMPRESSION: 1. No focal consolidation. 2. Probable mild vascular congestion. Electronically Signed   By: Angus Bark M.D.   On: 11/12/2023 09:17      Kathlen Para, MD, PhD [facility]  Between 7  am - 7 pm I am available, please contact me via Amion (for emergencies) or Securechat (non urgent messages)  Between 7 pm - 7 am  I am not available, please contact night coverage MD/APP via Amion

## 2023-11-12 NOTE — Progress Notes (Signed)
 This patient is receiving the antibiotic doxycycline  by the  intravenous route. Based on criteria approved by the Pharmacy and Therapeutics Committee, and the Infectious Disease Division, the antibiotic(s) is / are being converted to equivalent oral dose form(s). These criteria include:   Patient being treated for a respiratory tract infection, urinary tract infection, cellulitis, or Clostridium Difficile Associated Diarrhea  The patient is not neutropenic and does not exhibit a GI malabsorption state  The patient is eating (either orally or per tube) and/or has been taking other orally administered medications for at least 24 hours.  The patient is improving clinically (physician assessment and a 24-hour Tmax of < 100.78F).    If you have questions about this conversion, please contact the pharmacy department.   Thank you.   Valarie Garner, PharmD PGY1 Pharmacy Resident  Please check AMION for all [facility] phone numbers After 10:00 PM, call Main Pharmacy [phone] 11/12/23 12:15 PM

## 2023-11-12 NOTE — Plan of Care (Signed)

## 2023-11-13 ENCOUNTER — Inpatient Hospital Stay (HOSPITAL_COMMUNITY)

## 2023-11-13 DIAGNOSIS — R569 Unspecified convulsions: Secondary | ICD-10-CM | POA: Diagnosis not present

## 2023-11-13 DIAGNOSIS — B59 Pneumocystosis: Secondary | ICD-10-CM | POA: Diagnosis not present

## 2023-11-13 DIAGNOSIS — B20 Human immunodeficiency virus [HIV] disease: Secondary | ICD-10-CM | POA: Diagnosis not present

## 2023-11-13 LAB — CBC WITH DIFFERENTIAL/PLATELET
Abs Immature Granulocytes: 0.25 10*3/uL — ABNORMAL HIGH (ref 0.00–0.07)
Basophils Absolute: 0 10*3/uL (ref 0.0–0.1)
Basophils Relative: 0 %
Eosinophils Absolute: 0 10*3/uL (ref 0.0–0.5)
Eosinophils Relative: 0 %
HCT: 29.5 % — ABNORMAL LOW (ref 39.0–52.0)
Hemoglobin: 10.2 g/dL — ABNORMAL LOW (ref 13.0–17.0)
Immature Granulocytes: 2 %
Lymphocytes Relative: 2 %
Lymphs Abs: 0.3 10*3/uL — ABNORMAL LOW (ref 0.7–4.0)
MCH: 28.9 pg (ref 26.0–34.0)
MCHC: 34.6 g/dL (ref 30.0–36.0)
MCV: 83.6 fL (ref 80.0–100.0)
Monocytes Absolute: 0.1 10*3/uL (ref 0.1–1.0)
Monocytes Relative: 1 %
Neutro Abs: 11.7 10*3/uL — ABNORMAL HIGH (ref 1.7–7.7)
Neutrophils Relative %: 95 %
Platelets: 328 10*3/uL (ref 150–400)
RBC: 3.53 MIL/uL — ABNORMAL LOW (ref 4.22–5.81)
RDW: 13.4 % (ref 11.5–15.5)
WBC: 12.3 10*3/uL — ABNORMAL HIGH (ref 4.0–10.5)
nRBC: 0 % (ref 0.0–0.2)

## 2023-11-13 LAB — CULTURE, BLOOD (ROUTINE X 2)
Culture: NO GROWTH
Culture: NO GROWTH
Special Requests: ADEQUATE
Special Requests: ADEQUATE

## 2023-11-13 LAB — BASIC METABOLIC PANEL WITH GFR
Anion gap: 7 (ref 5–15)
BUN: 19 mg/dL (ref 6–20)
CO2: 26 mmol/L (ref 22–32)
Calcium: 9.3 mg/dL (ref 8.9–10.3)
Chloride: 98 mmol/L (ref 98–111)
Creatinine, Ser: 1.13 mg/dL (ref 0.61–1.24)
GFR, Estimated: >60 mL/min (ref >60)
Glucose, Bld: 139 mg/dL — ABNORMAL HIGH (ref 70–99)
Potassium: 5.2 mmol/L — ABNORMAL HIGH (ref 3.5–5.1)
Sodium: 131 mmol/L — ABNORMAL LOW (ref 135–145)

## 2023-11-13 MED ORDER — FLUOXETINE HCL 20 MG PO CAPS
20.0000 mg | ORAL_CAPSULE | Freq: Every day | ORAL | Status: DC
  Administered 2023-11-13 – 2023-11-17 (×4): 20 mg via ORAL
  Filled 2023-11-13 (×6): qty 1

## 2023-11-13 NOTE — TOC Progression Note (Signed)
 Transition of Care [facility]) - Progression Note    Patient Details  Name: Cory Russell MRN: [id number] Date of Birth: 14-Aug-1990  Transition of Care [facility] CM/SW Contact  Graves-Bigelow, Jari Merles, RN Phone Number: 11/13/2023, 1:45 PM  Clinical Narrative: Patient was discussed in progression; no home needs identified at this time.     Expected Discharge Plan: Home/Self Care Barriers to Discharge: No Barriers Identified  Expected Discharge Plan and Services   Discharge Planning Services: CM Consult Post Acute Care Choice: NA Living arrangements for the past 2 months: Single Family Home                   DME Agency: NA       HH Arranged: NA           Social Determinants of Health (SDOH) Interventions SDOH Screenings   Food Insecurity: No Food Insecurity (11/08/2023)  Housing: Low Risk  (11/08/2023)  Transportation Needs: No Transportation Needs (11/08/2023)  Utilities: Not At Risk (11/08/2023)  Depression (PHQ2-9): Low Risk  (04/12/2023)  Financial Resource Strain: Low Risk  (02/27/2023)   Received from [facility]  Tobacco Use: Low Risk  (11/08/2023)    Readmission Risk Interventions     No data to display

## 2023-11-13 NOTE — Progress Notes (Signed)
 [facility]    Date of Admission:  11/08/2023   Total days of antibiotics 7          ID: Claudius Mich is a 33 y.o. male with  Principal Problem:   Seizure-like activity (HCC) Active Problems:   CAP (community acquired pneumonia)   Protein-calorie malnutrition, severe   AIDS (acquired immune deficiency syndrome) (HCC)    Subjective: Still feeling short of breath with any exertion. Hypoxic when standing up. Feels that he has mood lability, as confirmed by his mom at his bedside  Medications:   bictegravir-emtricitabine -tenofovir  AF  1 tablet Oral Daily   darunavir -cobicistat   1 tablet Oral Q breakfast   doxycycline   100 mg Oral Q12H   enoxaparin  (LOVENOX ) injection  40 mg Subcutaneous Daily   feeding supplement  237 mL Oral TID BM   lidocaine   1 patch Transdermal Q24H   mirtazapine   15 mg Oral QHS   nystatin   5 mL Oral QID   predniSONE   40 mg Oral Q breakfast   Followed by   Cecily Cohen ON 11/18/2023] predniSONE   20 mg Oral Q breakfast    Objective: Vital signs in last 24 hours: Temp:  [97.9 F (36.6 C)-99.3 F (37.4 C)] 98.6 F (37 C) (06/09 0750) Pulse Rate:  [94] 94 (06/09 0750) Resp:  [13-16] 16 (06/09 0750) BP: (98-121)/(65-81) 109/65 (06/09 0750) SpO2:  [97 %-99 %] 99 % (06/09 0930) Physical Exam  Constitutional: He is oriented to person, place, and time. He appears well-developed and well-nourished. No distress.  HENT:  Mouth/Throat: Oropharynx is clear and moist. No oropharyngeal exudate.  Cardiovascular: Normal rate, regular rhythm and normal heart sounds. Exam reveals no gallop and no friction rub.  No murmur heard.  Pulmonary/Chest: Effort normal and breath sounds normal. No respiratory distress. He has no wheezes.  Abdominal: Soft. Bowel sounds are normal. He exhibits no distension. There is no tenderness.  Lymphadenopathy:  He has no cervical adenopathy.  Neurological: He is alert and oriented to person, place, and time.  Skin:  Skin is warm and dry. No rash noted. No erythema.  Psychiatric: He has a normal mood and affect. His behavior is normal.    Lab Results Recent Labs    11/12/23 0358 11/13/23 0459  NA 129* 131*  K 4.9 5.2*  CL 97* 98  CO2 26 26  BUN 22* 19  CREATININE 1.12 1.13     Microbiology: reviewed Studies/Results: DG CHEST PORT 1 VIEW Result Date: 11/12/2023 CLINICAL DATA:  Pneumonia. EXAM: PORTABLE CHEST 1 VIEW COMPARISON:  Chest radiograph dated 11/08/2023. FINDINGS: No focal consolidation, pleural effusion or pneumothorax. There is mild diffuse interstitial prominence which may represent mild vascular congestion. The cardiac silhouette is within normal limits. No acute osseous pathology. IMPRESSION: 1. No focal consolidation. 2. Probable mild vascular congestion. Electronically Signed   By: Angus Bark M.D.   On: 11/12/2023 09:17     Assessment/Plan: Probable PJP pneumonia (hypoxia, elevated LDH, interstitial pattern on cxr) = plan on continued treatment with bactrim  and steroids. Cr slightly elevated and potassium at 5.2 but not worsened. 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 No known integrase resistance  Currently on biktarvy  and prezcobix ; trying to see if can do a regimen that has smaller pill burden. For now we will continue on this regimen  Depression/anxiety = will start fluoxetine  Appetite stimulant = continue on mirtazipine   Arwen Haseley  [facility] Pager: [phone]  11/13/2023, 11:36 AM

## 2023-11-13 NOTE — Progress Notes (Signed)
 Physical Therapy Treatment Patient Details Name: Cory Russell MRN: [id number] DOB: [birth year] Today's Date: 11/13/2023   History of Present Illness Pt is a 33 yr old male who presented 11/08/23 with seizure-like activity. EEG pending. Pt was recently was dx with right lobe PNA. PMH: HIV, seizure disorder, HTN, rectal bleeding, depression.    PT Comments  Pt still self limiting.  Deferred OOB in lieu of resistive exercises of upper and LE's bil.     If plan is discharge home, recommend the following: A little help with walking and/or transfers;A little help with bathing/dressing/bathroom (PRN assist)   Can travel by private vehicle        Equipment Recommendations  None recommended by PT    Recommendations for Other Services       Precautions / Restrictions       Mobility  Bed Mobility               General bed mobility comments: deferred    Transfers                   General transfer comment: deferred    Ambulation/Gait                   Stairs             Wheelchair Mobility     Tilt Bed    Modified Rankin (Stroke Patients Only)       Balance                                            Communication Communication Communication: No apparent difficulties  Cognition Arousal: Alert Behavior During Therapy: Anxious   PT - Cognitive impairments: No apparent impairments                                Cueing    Exercises Other Exercises Other Exercises: presses to/from ceiling with graded resistance both concentric and eccentric bil x 10reps. Other Exercises: hip/knee flexion/ext A ROM x10 and graded resistance x10 reps bil Other Exercises: bicep tricep presses with graded resistance x10 reps bil Other Exercises: SLR bil x10 reps AROM, hip ab/add bil x10 reps Other Exercises: A/P x 30 bil    General Comments        Pertinent Vitals/Pain Pain Assessment Pain Assessment: Faces Faces Pain  Scale: Hurts a little bit Pain Location: general Pain Descriptors / Indicators: Discomfort Pain Intervention(s): Monitored during session    Home Living                          Prior Function            PT Goals (current goals can now be found in the care plan section) Acute Rehab PT Goals PT Goal Formulation: With patient Time For Goal Achievement: 11/22/23 Potential to Achieve Goals: Good Progress towards PT goals: Progressing toward goals    Frequency    Min 3X/week      PT Plan      Co-evaluation              AM-PAC PT "6 Clicks" Mobility   Outcome Measure  Help needed turning from your back to your side while in a flat bed without using bedrails?: None Help needed moving  from lying on your back to sitting on the side of a flat bed without using bedrails?: A Little Help needed moving to and from a bed to a chair (including a wheelchair)?: A Little Help needed standing up from a chair using your arms (e.g., wheelchair or bedside chair)?: A Little Help needed to walk in hospital room?: A Little Help needed climbing 3-5 steps with a railing? : A Little 6 Click Score: 19    End of Session Equipment Utilized During Treatment: Oxygen   Patient left: in bed;with call bell/phone within reach Nurse Communication: Mobility status PT Visit Diagnosis: Difficulty in walking, not elsewhere classified (R26.2)     Time: 1646-1700 PT Time Calculation (min) (ACUTE ONLY): 14 min  Charges:    $Therapeutic Exercise: 8-22 mins PT General Charges $$ ACUTE PT VISIT: 1 Visit                     11/13/2023  [name], PT Acute Rehabilitation Services [phone]  (office)   [name] Coleen Cardiff 11/13/2023, 5:07 PM

## 2023-11-13 NOTE — Plan of Care (Signed)
   Problem: Education: Goal: Knowledge of General Education information will improve Description Including pain rating scale, medication(s)/side effects and non-pharmacologic comfort measures Outcome: Progressing

## 2023-11-13 NOTE — Progress Notes (Signed)
 PROGRESS NOTE  Cory Russell ZOX:[id number] DOB: 1991/01/16 DOA: 11/08/2023 PCP: Liane Redman, MD   LOS: 5 days   Brief Narrative / Interim history: 33 year old male with history of HIV with nonadherence to medications, seizure disorder on valproic  acid, comes into the hospital with seizure-like activity.  He has not been taking his AEDs.  Workup showed residual pneumonia, he was transferred here for ID consultation.  Subjective / 24h Interval events: He is in chair, appears more comfortable.  Mother is at bedside  Assesement and Plan: Principal problem Sepsis due to pneumonia, immunosuppressed with underlying HIV/AIDS -ID consulted and following.  Patient antiretroviral therapy has been restarted - Has been placed on doxycycline , Bactrim  as well as prednisone  given hypoxemia and concern for PJP.  Continue - Pneumocystis smear, Legionella urinary antigen all pending.  RVP negative - HIV viral load elevated at 306K  Active problems Acute hypoxic respiratory failure due to hypoxemia-supplemental oxygen as needed remains pretty dyspneic with minimal ambulation.  Was started on prednisone  for suspected PJP pneumonia - Still requiring significant supplemental oxygen, wean off as tolerated - Chest x-ray today with some fluid overload, status post Lasix x 1 6/8  History of syphilis-RPR positive, troponin pallidum antibody reactive.  Decision to treat deferred to ID  History of seizure disorder-this is likely in the setting of nonadherence to AEDs.  MRI of the brain with and without contrast is fairly unremarkable.  For now continue valproate  Hyponatremia-in the setting of HIV and lung processes  Hyperkalemia-Lokelma x 1.  Severe protein calorie malnutrition-BMI 16.  On Remeron   Scheduled Meds:  bictegravir-emtricitabine -tenofovir  AF  1 tablet Oral Daily   darunavir -cobicistat   1 tablet Oral Q breakfast   doxycycline   100 mg Oral Q12H   enoxaparin  (LOVENOX ) injection  40 mg  Subcutaneous Daily   feeding supplement  237 mL Oral TID BM   lidocaine   1 patch Transdermal Q24H   mirtazapine   15 mg Oral QHS   nystatin   5 mL Oral QID   predniSONE   40 mg Oral Q breakfast   Followed by   Cecily Cohen ON 11/18/2023] predniSONE   20 mg Oral Q breakfast   Continuous Infusions:  sulfamethoxazole -trimethoprim  244.96 mg of trimethoprim  (11/13/23 0503)   valproate sodium  500 mg (11/13/23 0924)   PRN Meds:.benzonatate , ipratropium-albuterol , LORazepam , melatonin, mouth rinse, oxyCODONE , polyethylene glycol, prochlorperazine   Current Outpatient Medications  Medication Instructions   amoxicillin  (AMOXIL ) 500 MG capsule Take by mouth.   bictegravir-emtricitabine -tenofovir  AF (BIKTARVY ) 50-200-25 MG TABS tablet 1 tablet, Oral, Daily   cetirizine (ZYRTEC) 10 MG tablet 1 tablet, Oral, Daily   mirtazapine  (REMERON ) 15 mg, Oral, Daily at bedtime   ondansetron  (ZOFRAN ) 4 mg, Oral, Daily PRN, Take 30 minutes prior to medication   predniSONE  (DELTASONE ) 5 MG tablet Take per taper instructions    sulfamethoxazole -trimethoprim  (BACTRIM  DS) 800-160 MG tablet 1 tablet, Oral, 3 times weekly   valproic  acid (DEPAKENE ) 250 mg, Oral, 4 times daily   zolpidem  (AMBIEN ) 5 mg, Oral, At bedtime PRN    Diet Orders (From admission, onward)     Start     Ordered   11/08/23 0103  Diet regular Room service appropriate? Yes; Fluid consistency: Thin  Diet effective now       Question Answer Comment  Room service appropriate? Yes   Fluid consistency: Thin      11/08/23 0102            DVT prophylaxis: enoxaparin  (LOVENOX ) injection 40 mg Start: 11/12/23 1000   Lab Results  Component Value Date   PLT 328 11/10/2023      Code Status: Full Code  Family Communication: No family at bedside  Status is: Inpatient Remains inpatient appropriate because: Severity of illness   Level of care: Progressive  Consultants:  ID  Objective: Vitals:   11/13/23 0029 11/13/23 0432 11/13/23 0503  11/13/23 0750  BP: 121/71 98/81 121/79 109/65  Pulse:    94  Resp: 16 15  16   Temp: 97.9 F (36.6 C) 98.4 F (36.9 C)  98.6 F (37 C)  TempSrc: Oral Oral  Oral  SpO2:    98%  Weight:      Height:        Intake/Output Summary (Last 24 hours) at 11/13/2023 1011 Last data filed at 11/13/2023 0434 Gross per 24 hour  Intake 2767.24 ml  Output 1725 ml  Net 1042.24 ml   Wt Readings from Last 3 Encounters:  11/08/23 49 kg  10/03/23 54.4 kg  04/12/23 57.3 kg    Examination: Constitutional: NAD Eyes: lids and conjunctivae normal, no scleral icterus ENMT: mmm Neck: normal, supple Respiratory: clear to auscultation bilaterally, no wheezing, no crackles. Normal respiratory effort.  Cardiovascular: Regular rate and rhythm, no murmurs / rubs / gallops. No LE edema. Abdomen: soft, no distention, no tenderness. Bowel sounds positive.   Data Reviewed: I have independently reviewed following labs and imaging studies   CBC Recent Labs  Lab 11/08/23 0418 11/09/23 0416 11/10/23 0506  WBC 6.9 6.9 12.1*  HGB 10.1* 10.4* 10.3*  HCT 29.5* 31.3* 30.1*  PLT 296 328 328  MCV 83.1 84.6 83.6  MCH 28.5 28.1 28.6  MCHC 34.2 33.2 34.2  RDW 13.1 13.4 13.2  LYMPHSABS 0.6*  --   --   MONOABS 0.6  --   --   EOSABS 0.0  --   --   BASOSABS 0.0  --   --     Recent Labs  Lab 11/08/23 0418 11/08/23 0811 11/09/23 0416 11/10/23 0506 11/11/23 0349 11/12/23 0358 11/13/23 0459  NA 131*  --  136 134* 130* 129* 131*  K 4.4  --  4.7 5.2* 5.3* 4.9 5.2*  CL 96*  --  103 101 97* 97* 98  CO2 25  --  23 24 25 26 26   GLUCOSE 118*  --  109* 116* 128* 140* 139*  BUN 16  --  16 16 19  22* 19  CREATININE 0.83  --  0.92 0.96 1.06 1.12 1.13  CALCIUM 8.9  --  9.2 9.4 8.9 8.9 9.3  AST 30  --  35 32  --   --   --   ALT 18  --  24 20  --   --   --   ALKPHOS 42  --  46 43  --   --   --   BILITOT 0.5  --  0.5 0.3  --   --   --   ALBUMIN 2.5*  --  2.3* 2.3*  --   --   --   MG 1.7  --  2.0 2.0  --   --   --    PROCALCITON <0.10  --   --   --   --   --   --   LATICACIDVEN 1.2 0.8  --   --   --   --   --     ------------------------------------------------------------------------------------------------------------------ No results for input(s): "CHOL", "HDL", "LDLCALC", "TRIG", "CHOLHDL", "LDLDIRECT" in the last 72 hours.  No results  found for: "HGBA1C" ------------------------------------------------------------------------------------------------------------------ No results for input(s): "TSH", "T4TOTAL", "T3FREE", "THYROIDAB" in the last 72 hours.  Invalid input(s): "FREET3"  Cardiac Enzymes No results for input(s): "CKMB", "TROPONINI", "MYOGLOBIN" in the last 168 hours.  Invalid input(s): "CK" ------------------------------------------------------------------------------------------------------------------ No results found for: "BNP"  CBG: No results for input(s): "GLUCAP" in the last 168 hours.  Recent Results (from the past 240 hours)  Culture, blood (Routine X 2) w Reflex to ID Panel     Status: None   Collection Time: 11/08/23  4:18 AM   Specimen: BLOOD  Result Value Ref Range Status   Specimen Description BLOOD BLOOD RIGHT ARM  Final   Special Requests   Final    BOTTLES DRAWN AEROBIC AND ANAEROBIC Blood Culture adequate volume   Culture   Final    NO GROWTH 5 DAYS Performed at [facility], [address], [place], [place] [address]    Report Status 11/13/2023 FINAL  Final  Culture, blood (Routine X 2) w Reflex to ID Panel     Status: None   Collection Time: 11/08/23  4:22 AM   Specimen: BLOOD  Result Value Ref Range Status   Specimen Description BLOOD BLOOD RIGHT ARM  Final   Special Requests   Final    BOTTLES DRAWN AEROBIC AND ANAEROBIC Blood Culture adequate volume   Culture   Final    NO GROWTH 5 DAYS Performed at [facility], [address], [place], [place] [address]    Report Status 11/13/2023 FINAL  Final  Respiratory (~20 pathogens) panel  by PCR     Status: None   Collection Time: 11/08/23  8:42 AM   Specimen: Nasopharyngeal Swab; Respiratory  Result Value Ref Range Status   Adenovirus NOT DETECTED NOT DETECTED Final   Coronavirus 229E NOT DETECTED NOT DETECTED Final    Comment: (NOTE) The Coronavirus on the Respiratory Panel, DOES NOT test for the novel  Coronavirus (2019 nCoV)    Coronavirus HKU1 NOT DETECTED NOT DETECTED Final   Coronavirus NL63 NOT DETECTED NOT DETECTED Final   Coronavirus OC43 NOT DETECTED NOT DETECTED Final   Metapneumovirus NOT DETECTED NOT DETECTED Final   Rhinovirus / Enterovirus NOT DETECTED NOT DETECTED Final   Influenza A NOT DETECTED NOT DETECTED Final   Influenza B NOT DETECTED NOT DETECTED Final   Parainfluenza Virus 1 NOT DETECTED NOT DETECTED Final   Parainfluenza Virus 2 NOT DETECTED NOT DETECTED Final   Parainfluenza Virus 3 NOT DETECTED NOT DETECTED Final   Parainfluenza Virus 4 NOT DETECTED NOT DETECTED Final   Respiratory Syncytial Virus NOT DETECTED NOT DETECTED Final   Bordetella pertussis NOT DETECTED NOT DETECTED Final   Bordetella Parapertussis NOT DETECTED NOT DETECTED Final   Chlamydophila pneumoniae NOT DETECTED NOT DETECTED Final   Mycoplasma pneumoniae NOT DETECTED NOT DETECTED Final    Comment: Performed at [facility], [address] [place], [place] [address]     Radiology Studies: No results found.     Kathlen Para, MD, PhD [facility]  Between 7 am - 7 pm I am available, please contact me via Amion (for emergencies) or Securechat (non urgent messages)  Between 7 pm - 7 am I am not available, please contact night coverage MD/APP via Amion

## 2023-11-14 ENCOUNTER — Inpatient Hospital Stay (HOSPITAL_COMMUNITY)

## 2023-11-14 DIAGNOSIS — R569 Unspecified convulsions: Secondary | ICD-10-CM | POA: Diagnosis not present

## 2023-11-14 LAB — FUNGITELL BETA-D-GLUCAN
Fungitell Value:: >500 pg/mL
Result Name:: POSITIVE — AB

## 2023-11-14 LAB — BASIC METABOLIC PANEL WITH GFR
Anion gap: 8 (ref 5–15)
BUN: 16 mg/dL (ref 6–20)
CO2: 24 mmol/L (ref 22–32)
Calcium: 9 mg/dL (ref 8.9–10.3)
Chloride: 97 mmol/L — ABNORMAL LOW (ref 98–111)
Creatinine, Ser: 0.94 mg/dL (ref 0.61–1.24)
GFR, Estimated: >60 mL/min (ref >60)
Glucose, Bld: 143 mg/dL — ABNORMAL HIGH (ref 70–99)
Potassium: 4.4 mmol/L (ref 3.5–5.1)
Sodium: 129 mmol/L — ABNORMAL LOW (ref 135–145)

## 2023-11-14 LAB — D-DIMER, QUANTITATIVE: D-Dimer, Quant: 1.13 ug{FEU}/mL — ABNORMAL HIGH (ref 0.00–0.50)

## 2023-11-14 MED ORDER — SALINE SPRAY 0.65 % NA SOLN
1.0000 | NASAL | Status: DC | PRN
  Administered 2023-11-15: 1 via NASAL
  Filled 2023-11-14: qty 44

## 2023-11-14 MED ORDER — FUROSEMIDE 10 MG/ML IJ SOLN
40.0000 mg | Freq: Once | INTRAMUSCULAR | Status: AC
  Administered 2023-11-14: 40 mg via INTRAVENOUS
  Filled 2023-11-14: qty 4

## 2023-11-14 MED ORDER — VALPROIC ACID 250 MG/5ML PO SOLN
500.0000 mg | Freq: Two times a day (BID) | ORAL | Status: DC
  Administered 2023-11-14 – 2023-11-16 (×5): 500 mg via ORAL
  Filled 2023-11-14 (×8): qty 10

## 2023-11-14 MED ORDER — IOHEXOL 350 MG/ML SOLN
75.0000 mL | Freq: Once | INTRAVENOUS | Status: AC | PRN
  Administered 2023-11-14: 75 mL via INTRAVENOUS

## 2023-11-14 NOTE — Plan of Care (Signed)
   Problem: Coping: Goal: Level of anxiety will decrease Outcome: Progressing   Problem: Elimination: Goal: Will not experience complications related to bowel motility Outcome: Progressing

## 2023-11-14 NOTE — Progress Notes (Signed)
 PROGRESS NOTE  Cory Russell ZOX:[id number] DOB: 1990-11-27 DOA: 11/08/2023 PCP: Liane Redman, MD   LOS: 6 days   Brief Narrative / Interim history: 33 year old male with history of HIV with nonadherence to medications, seizure disorder on valproic  acid, comes into the hospital with seizure-like activity.  He has not been taking his AEDs.  Workup showed residual pneumonia, he was transferred here for ID consultation.  Subjective / 24h Interval events: He tells me he is feeling much worse today, has been having diffuse chest pain and increased difficulties breathing due to inability to take a deep breath  Assesement and Plan: Principal problem Sepsis due to pneumonia, immunosuppressed with underlying HIV/AIDS -ID consulted and following.  Patient antiretroviral therapy has been restarted - Has been placed on doxycycline , Bactrim  as well as prednisone  given hypoxemia and concern for PJP.  Continue - HIV viral load elevated at 306K  Active problems Acute hypoxic respiratory failure due to hypoxemia-supplemental oxygen as needed remains pretty dyspneic with minimal ambulation.  Was started on prednisone  for suspected PJP pneumonia - Still requiring significant supplemental oxygen, wean off as tolerated - Received Lasix x 1 on 6/8 - Worsening chest pain today, feels that he has worsening shortness of breath.  Has been refusing Lovenox  and essentially in bed most days, obtain D-dimer and if high, CT angio  History of syphilis-RPR positive, troponin pallidum antibody reactive.  Decision to treat deferred to ID  History of seizure disorder-this is likely in the setting of nonadherence to AEDs.  MRI of the brain with and without contrast is fairly unremarkable.  For now continue valproate  Hyponatremia-in the setting of HIV and lung processes  Hyperkalemia-Lokelma x 1.  Severe protein calorie malnutrition-BMI 16.  On Remeron   Scheduled Meds:  bictegravir-emtricitabine -tenofovir  AF  1  tablet Oral Daily   darunavir -cobicistat   1 tablet Oral Q breakfast   doxycycline   100 mg Oral Q12H   enoxaparin  (LOVENOX ) injection  40 mg Subcutaneous Daily   feeding supplement  237 mL Oral TID BM   FLUoxetine  20 mg Oral Daily   lidocaine   1 patch Transdermal Q24H   mirtazapine   15 mg Oral QHS   nystatin   5 mL Oral QID   predniSONE   40 mg Oral Q breakfast   Followed by   Cecily Cohen ON 11/18/2023] predniSONE   20 mg Oral Q breakfast   Continuous Infusions:  sulfamethoxazole -trimethoprim  244.96 mg of trimethoprim  (11/14/23 0458)   valproate sodium  500 mg (11/14/23 0930)   PRN Meds:.benzonatate , ipratropium-albuterol , LORazepam , melatonin, mouth rinse, oxyCODONE , polyethylene glycol, prochlorperazine   Current Outpatient Medications  Medication Instructions   amoxicillin  (AMOXIL ) 500 MG capsule Take by mouth.   bictegravir-emtricitabine -tenofovir  AF (BIKTARVY ) 50-200-25 MG TABS tablet 1 tablet, Oral, Daily   cetirizine (ZYRTEC) 10 MG tablet 1 tablet, Oral, Daily   mirtazapine  (REMERON ) 15 mg, Oral, Daily at bedtime   ondansetron  (ZOFRAN ) 4 mg, Oral, Daily PRN, Take 30 minutes prior to medication   predniSONE  (DELTASONE ) 5 MG tablet Take per taper instructions    sulfamethoxazole -trimethoprim  (BACTRIM  DS) 800-160 MG tablet 1 tablet, Oral, 3 times weekly   valproic  acid (DEPAKENE ) 250 mg, Oral, 4 times daily   zolpidem  (AMBIEN ) 5 mg, Oral, At bedtime PRN    Diet Orders (From admission, onward)     Start     Ordered   11/08/23 0103  Diet regular Room service appropriate? Yes; Fluid consistency: Thin  Diet effective now       Question Answer Comment  Room service appropriate? Yes  Fluid consistency: Thin      11/08/23 0102            DVT prophylaxis: enoxaparin  (LOVENOX ) injection 40 mg Start: 11/12/23 1000   Lab Results  Component Value Date   PLT 328 11/13/2023      Code Status: Full Code  Family Communication: No family at bedside  Status is: Inpatient Remains  inpatient appropriate because: Severity of illness   Level of care: Progressive  Consultants:  ID  Objective: Vitals:   11/13/23 2100 11/14/23 0013 11/14/23 0430 11/14/23 0811  BP: (!) 143/83 116/66 134/68 127/68  Pulse:  85 96 (!) 108  Resp: 18  20 20   Temp: 98.1 F (36.7 C) 98.1 F (36.7 C) 98 F (36.7 C) 98.7 F (37.1 C)  TempSrc: Oral Oral Oral Oral  SpO2:  (!) 80%  93%  Weight:      Height:        Intake/Output Summary (Last 24 hours) at 11/14/2023 0932 Last data filed at 11/14/2023 0500 Gross per 24 hour  Intake 1181.15 ml  Output 2075 ml  Net -893.85 ml   Wt Readings from Last 3 Encounters:  11/08/23 49 kg  10/03/23 54.4 kg  04/12/23 57.3 kg    Examination: Constitutional: NAD Eyes: lids and conjunctivae normal, no scleral icterus ENMT: mmm Neck: normal, supple Respiratory: clear to auscultation bilaterally, no wheezing, no crackles. Normal respiratory effort.  Cardiovascular: Regular rate and rhythm, no murmurs / rubs / gallops. No LE edema. Abdomen: soft, no distention, no tenderness. Bowel sounds positive.   Data Reviewed: I have independently reviewed following labs and imaging studies   CBC Recent Labs  Lab 11/08/23 0418 11/09/23 0416 11/10/23 0506 11/13/23 1526  WBC 6.9 6.9 12.1* 12.3*  HGB 10.1* 10.4* 10.3* 10.2*  HCT 29.5* 31.3* 30.1* 29.5*  PLT 296 328 328 328  MCV 83.1 84.6 83.6 83.6  MCH 28.5 28.1 28.6 28.9  MCHC 34.2 33.2 34.2 34.6  RDW 13.1 13.4 13.2 13.4  LYMPHSABS 0.6*  --   --  0.3*  MONOABS 0.6  --   --  0.1  EOSABS 0.0  --   --  0.0  BASOSABS 0.0  --   --  0.0    Recent Labs  Lab 11/08/23 0418 11/08/23 0811 11/09/23 0416 11/10/23 0506 11/11/23 0349 11/12/23 0358 11/13/23 0459 11/14/23 0842  NA 131*  --  136 134* 130* 129* 131*  --   K 4.4  --  4.7 5.2* 5.3* 4.9 5.2*  --   CL 96*  --  103 101 97* 97* 98  --   CO2 25  --  23 24 25 26 26   --   GLUCOSE 118*  --  109* 116* 128* 140* 139*  --   BUN 16  --  16 16 19   22* 19  --   CREATININE 0.83  --  0.92 0.96 1.06 1.12 1.13  --   CALCIUM 8.9  --  9.2 9.4 8.9 8.9 9.3  --   AST 30  --  35 32  --   --   --   --   ALT 18  --  24 20  --   --   --   --   ALKPHOS 42  --  46 43  --   --   --   --   BILITOT 0.5  --  0.5 0.3  --   --   --   --   ALBUMIN  2.5*  --  2.3* 2.3*  --   --   --   --   MG 1.7  --  2.0 2.0  --   --   --   --   DDIMER  --   --   --   --   --   --   --  1.13*  PROCALCITON <0.10  --   --   --   --   --   --   --   LATICACIDVEN 1.2 0.8  --   --   --   --   --   --     ------------------------------------------------------------------------------------------------------------------ No results for input(s): "CHOL", "HDL", "LDLCALC", "TRIG", "CHOLHDL", "LDLDIRECT" in the last 72 hours.  No results found for: "HGBA1C" ------------------------------------------------------------------------------------------------------------------ No results for input(s): "TSH", "T4TOTAL", "T3FREE", "THYROIDAB" in the last 72 hours.  Invalid input(s): "FREET3"  Cardiac Enzymes No results for input(s): "CKMB", "TROPONINI", "MYOGLOBIN" in the last 168 hours.  Invalid input(s): "CK" ------------------------------------------------------------------------------------------------------------------ No results found for: "BNP"  CBG: No results for input(s): "GLUCAP" in the last 168 hours.  Recent Results (from the past 240 hours)  Culture, blood (Routine X 2) w Reflex to ID Panel     Status: None   Collection Time: 11/08/23  4:18 AM   Specimen: BLOOD  Result Value Ref Range Status   Specimen Description BLOOD BLOOD RIGHT ARM  Final   Special Requests   Final    BOTTLES DRAWN AEROBIC AND ANAEROBIC Blood Culture adequate volume   Culture   Final    NO GROWTH 5 DAYS Performed at [facility], [address] [place], [place] [address]    Report Status 11/13/2023 FINAL  Final  Culture, blood (Routine X 2) w Reflex to ID Panel     Status: None    Collection Time: 11/08/23  4:22 AM   Specimen: BLOOD  Result Value Ref Range Status   Specimen Description BLOOD BLOOD RIGHT ARM  Final   Special Requests   Final    BOTTLES DRAWN AEROBIC AND ANAEROBIC Blood Culture adequate volume   Culture   Final    NO GROWTH 5 DAYS Performed at [facility], [address], [place], [place] [address]    Report Status 11/13/2023 FINAL  Final  Respiratory (~20 pathogens) panel by PCR     Status: None   Collection Time: 11/08/23  8:42 AM   Specimen: Nasopharyngeal Swab; Respiratory  Result Value Ref Range Status   Adenovirus NOT DETECTED NOT DETECTED Final   Coronavirus 229E NOT DETECTED NOT DETECTED Final    Comment: (NOTE) The Coronavirus on the Respiratory Panel, DOES NOT test for the novel  Coronavirus (2019 nCoV)    Coronavirus HKU1 NOT DETECTED NOT DETECTED Final   Coronavirus NL63 NOT DETECTED NOT DETECTED Final   Coronavirus OC43 NOT DETECTED NOT DETECTED Final   Metapneumovirus NOT DETECTED NOT DETECTED Final   Rhinovirus / Enterovirus NOT DETECTED NOT DETECTED Final   Influenza A NOT DETECTED NOT DETECTED Final   Influenza B NOT DETECTED NOT DETECTED Final   Parainfluenza Virus 1 NOT DETECTED NOT DETECTED Final   Parainfluenza Virus 2 NOT DETECTED NOT DETECTED Final   Parainfluenza Virus 3 NOT DETECTED NOT DETECTED Final   Parainfluenza Virus 4 NOT DETECTED NOT DETECTED Final   Respiratory Syncytial Virus NOT DETECTED NOT DETECTED Final   Bordetella pertussis NOT DETECTED NOT DETECTED Final   Bordetella Parapertussis NOT DETECTED NOT DETECTED Final  Chlamydophila pneumoniae NOT DETECTED NOT DETECTED Final   Mycoplasma pneumoniae NOT DETECTED NOT DETECTED Final    Comment: Performed at [facility], [address] [place], [place] [address]     Radiology Studies: DG CHEST PORT 1 VIEW Result Date: 11/13/2023 CLINICAL DATA:  Pneumonia EXAM: PORTABLE CHEST 1 VIEW COMPARISON:  Chest radiograph dated 11/12/2023 FINDINGS:  Low lung volumes with bronchovascular crowding. Patchy bilateral lower lung opacities. No pleural effusion or pneumothorax. The heart size and mediastinal contours are within normal limits. No acute osseous abnormality. IMPRESSION: Low lung volumes with bronchovascular crowding. Patchy bilateral lower lung opacities, which may represent atelectasis or pneumonia. Electronically Signed   By: Limin  Xu M.D.   On: 11/13/2023 15:55       Kathlen Para, MD, PhD [facility]  Between 7 am - 7 pm I am available, please contact me via Amion (for emergencies) or Securechat (non urgent messages)  Between 7 pm - 7 am I am not available, please contact night coverage MD/APP via Amion

## 2023-11-14 NOTE — Progress Notes (Signed)
 OT Cancellation Note  Patient Details Name: Cory Russell MRN: [id number] DOB: [birth year]   Cancelled Treatment:    Reason Eval/Treat Not Completed: Patient declined, no reason specified (patient declining OT stating he was having difficutly breathing and chest pain. Patient state he doesn't believe he will be able to participate today. OT will reattempt as schedule permits.)  Jovita Nipper 11/14/2023, 9:28 AM  Anitra Barn, OTA Acute Rehabilitation Services  Office [phone]

## 2023-11-14 NOTE — Progress Notes (Signed)
 PT Cancellation Note  Patient Details Name: Cory Russell MRN: [id number] DOB: [birth year]   Cancelled Treatment:    Reason Eval/Treat Not Completed: Patient declined, no reason specified.  Not feeling well, unrested this morning.  Will try back this afternoon. 11/14/2023  [name], PT Acute Rehabilitation Services [phone]  (office)   [name] [name] 11/14/2023, 11:02 AM

## 2023-11-15 DIAGNOSIS — R569 Unspecified convulsions: Secondary | ICD-10-CM | POA: Diagnosis not present

## 2023-11-15 LAB — BASIC METABOLIC PANEL WITH GFR
Anion gap: 11 (ref 5–15)
BUN: 25 mg/dL — ABNORMAL HIGH (ref 6–20)
CO2: 23 mmol/L (ref 22–32)
Calcium: 9.1 mg/dL (ref 8.9–10.3)
Chloride: 94 mmol/L — ABNORMAL LOW (ref 98–111)
Creatinine, Ser: 1.14 mg/dL (ref 0.61–1.24)
GFR, Estimated: >60 mL/min (ref >60)
Glucose, Bld: 114 mg/dL — ABNORMAL HIGH (ref 70–99)
Potassium: 5 mmol/L (ref 3.5–5.1)
Sodium: 128 mmol/L — ABNORMAL LOW (ref 135–145)

## 2023-11-15 LAB — LEGIONELLA PNEUMOPHILA SEROGP 1 UR AG: L. pneumophila Serogp 1 Ur Ag: NEGATIVE

## 2023-11-15 MED ORDER — HYDROCORTISONE (PERIANAL) 2.5 % EX CREA
TOPICAL_CREAM | Freq: Four times a day (QID) | CUTANEOUS | Status: DC
  Administered 2023-11-26 – 2023-11-29 (×7): 1 via RECTAL
  Filled 2023-11-15 (×3): qty 28.35

## 2023-11-15 MED ORDER — ACETAMINOPHEN 325 MG PO TABS
650.0000 mg | ORAL_TABLET | Freq: Four times a day (QID) | ORAL | Status: DC | PRN
  Administered 2023-11-15 – 2023-11-22 (×5): 650 mg via ORAL
  Filled 2023-11-15 (×8): qty 2

## 2023-11-15 NOTE — Progress Notes (Signed)
 Occupational Therapy Treatment Patient Details Name: Cory Russell MRN: [id number] DOB: 09/22/1990 Today's Date: 11/15/2023   History of present illness Pt is a 33 yr old male who presented 11/08/23 with seizure-like activity. EEG pending. Pt was recently was dx with right lobe PNA. PMH: HIV, seizure disorder, HTN, rectal bleeding, depression. 6/10 CXR: No CT evidence of pulmonary artery embolus. Diffuse ground-glass density throughout the lungs may represent edema, pneumonia, ARDS, or combination.   OT comments  Patient progressing but not in a smooth, linear way. Meaning, pt was doing very well on initial OT Evaluation, but has back stepped since that time due to pain with some therapy limits those days, anxiety and subsequent decreased mobility/activity. Pt now progressing from refusing therapy but remains more limited than he was at initial Evaluation.    Pt did endorse feeling better today, showed improved ability to perform seated and standing ADLs without hands-on assistance compared to previous session, BUT with frequent desaturations on 10L via HFNC with light activity, going as low as 66% with needs of long rest breaks and time for PLB with cues until pt recovers to 90s. Pt also becomes tachycardic as high as 120 when noted on tele screen.  Patient remains limited by the above as well as generalized weakness and decreased activity tolerance along with deficits noted below. Pt continues to demonstrate good rehab potential and would benefit from continued skilled OT to increase safety and independence with ADLs and functional transfers to allow pt to return home safely and reduce caregiver burden and fall risk.       If plan is discharge home, recommend the following:  Assistance with cooking/housework;Assist for transportation   Equipment Recommendations  Tub/shower seat    Recommendations for Other Services      Precautions / Restrictions Precautions Precautions: Fall Recall of  Precautions/Restrictions: Intact Precaution/Restrictions Comments: Mon O2 and HR Restrictions Weight Bearing Restrictions Per Provider Order: No       Mobility Bed Mobility               General bed mobility comments: Pt greeted ambulating in room with PT who handed off to OT. Pt then returned to recliner    Transfers                         Balance Overall balance assessment: No apparent balance deficits (not formally assessed), Needs assistance   Sitting balance-Leahy Scale: Good     Standing balance support:  (able to release B hands at sink for hand hygiene without LOB. Needs RW for ambulation.) Standing balance-Leahy Scale: Fair                             ADL either performed or assessed with clinical judgement   ADL Overall ADL's : Needs assistance/impaired     Grooming: Wash/dry hands;Supervision/safety;Standing;Cueing for safety Grooming Details (indicate cue type and reason): Cues for safe positioning of RW               Lower Body Dressing Details (indicate cue type and reason): Pt wearing shower shoes and kicked off once in recliner. Toilet Transfer: Supervision/safety;Ambulation;Rolling walker (2 wheels) Toilet Transfer Details (indicate cue type and reason): Once pt returned to recliner pt needed Min As to control descent. Needs cues for UE reach back. Toileting- Clothing Manipulation and Hygiene: Set up;Supervision/safety;Sitting/lateral lean;Sit to/from stand Toileting - Clothing Manipulation Details (indicate cue type and reason):  Cues for pacing and rest breaks based on SpO2 and HR at any given moment.     Functional mobility during ADLs: Supervision/safety;Rolling walker (2 wheels);Cueing for safety      Extremity/Trunk Assessment Upper Extremity Assessment Upper Extremity Assessment: Overall WFL for tasks assessed   Lower Extremity Assessment Lower Extremity Assessment: Defer to PT evaluation   Cervical / Trunk  Assessment Cervical / Trunk Assessment: Normal    Vision   Vision Assessment?: No apparent visual deficits   Perception     Praxis     Communication     Cognition Arousal: Alert Behavior During Therapy: WFL for tasks assessed/performed Cognition: No apparent impairments             OT - Cognition Comments: Smiling and reports feeling much better today.                 Following commands: Intact        Cueing      Exercises Other Exercises Other Exercises: Energy conservation education with focus on pacing and breaking up activities to maintain energy and SpO2. Pt would benefit from handout. Cues for PLB as needed.    Shoulder Instructions       General Comments      Pertinent Vitals/ Pain       Pain Assessment Pain Assessment: Faces Faces Pain Scale: No hurt  Home Living                                          Prior Functioning/Environment              Frequency  Min 2X/week        Progress Toward Goals  OT Goals(current goals can now be found in the care plan section)  Progress towards OT goals: Progressing toward goals  Acute Rehab OT Goals Patient Stated Goal: Be able to tolerate more activity OT Goal Formulation: With patient Time For Goal Achievement: 11/22/23 Potential to Achieve Goals: Good  Plan      Co-evaluation                 AM-PAC OT 6 Clicks Daily Activity     Outcome Measure   Help from another person eating meals?: None Help from another person taking care of personal grooming?: None Help from another person toileting, which includes using toliet, bedpan, or urinal?: A Little Help from another person bathing (including washing, rinsing, drying)?: A Little Help from another person to put on and taking off regular upper body clothing?: None Help from another person to put on and taking off regular lower body clothing?: A Little 6 Click Score: 21    End of Session Equipment  Utilized During Treatment: Rolling walker (2 wheels);Oxygen  OT Visit Diagnosis: Unsteadiness on feet (R26.81) (Decreased ADLs)   Activity Tolerance Treatment limited secondary to medical complications (Comment) (desats and becomes tachycardic with light activity.)   Patient Left in chair;with call bell/phone within reach;with chair alarm set;with nursing/sitter in room   Nurse Communication Other (comment) (Pt request special cream. RN responded quickly.)        Time: 7846-9629 OT Time Calculation (min): 23 min  Charges: OT General Charges $OT Visit: 1 Visit OT Treatments $Self Care/Home Management : 8-22 mins $Therapeutic Activity: 8-22 mins  [facility], OT Acute Rehab Services Office: [phone] 11/15/2023   Cory Russell 11/15/2023, 12:39 PM

## 2023-11-15 NOTE — Hospital Course (Addendum)
 33 y.o. M with HIV/AIDS, epilepsy, nonadherent to medications who presented with subacute cough and dyspnea, finally had seizure and presented to the hospital.    Found to have pneumonia with high O2 needs, bilateral infiltrates, undetectable CD4 count.  Admitted on treatment for PJP.

## 2023-11-15 NOTE — Progress Notes (Signed)
 Physical Therapy Treatment Patient Details Name: Cory Russell MRN: [id number] DOB: [birth year] Today's Date: 11/15/2023   History of Present Illness Pt is a 33 yr old male who presented 11/08/23 with seizure-like activity. EEG pending. Pt was recently was dx with right lobe PNA. PMH: HIV, seizure disorder, HTN, rectal bleeding, depression.    PT Comments  Pt initially refuses PT but is agreeable to getting up to recliner. Once there he reports he will do PT started with seated exercises. Also worked with pursed lip breathing and breathing exercises for decreased anxiety especially with movement. After which pt request to go to bathroom. PT assisted with management of O2 tank and lines and leads. Pt SpO2 dropped to 86% SpO2 with ambulation but pt able to recover >60 sec with purse lip deep breathing. Pt has notable reduction in anxiety when able to achieve control over his breath. OT in room and agreeable to get pt back to recliner after using the bathroom.     If plan is discharge home, recommend the following: A little help with walking and/or transfers;A little help with bathing/dressing/bathroom (PRN assist)   Can travel by private vehicle      Yes  Equipment Recommendations  None recommended by PT       Precautions / Restrictions Precautions Precautions: Fall Recall of Precautions/Restrictions: Intact Precaution/Restrictions Comments: Mon O2 and HR Restrictions Weight Bearing Restrictions Per Provider Order: No     Mobility  Bed Mobility Overal bed mobility: Modified Independent                  Transfers Overall transfer level: Needs assistance Equipment used: None Transfers: Bed to chair/wheelchair/BSC     Step pivot transfers: Contact guard assist       General transfer comment: contact guard for safety and management of wires    Ambulation/Gait Ambulation/Gait assistance: Supervision Gait Distance (Feet): 10 Feet Assistive device: Rolling walker (2  wheels) Gait Pattern/deviations: Step-through pattern, Decreased stride length   Gait velocity interpretation: <1.8 ft/sec, indicate of risk for recurrent falls   General Gait Details: pt with strong, steady gait, assist for management of O2 tank and telebox, HR briefly in 120s, decreased back to 100s with purse lipped breathing          Balance Overall balance assessment: No apparent balance deficits (not formally assessed), Needs assistance   Sitting balance-Leahy Scale: Good       Standing balance-Leahy Scale: Fair                              Hotel manager: No apparent difficulties  Cognition Arousal: Alert Behavior During Therapy: WFL for tasks assessed/performed, Anxious   PT - Cognitive impairments: No apparent impairments                       PT - Cognition Comments: originally anxious about therapy, but agrees to transfer to recliner which decreases his anxiety Following commands: Intact      Cueing Cueing Techniques: Verbal cues, Gestural cues  Exercises General Exercises - Lower Extremity Gluteal Sets: AROM, Both, 10 reps, Seated Long Arc Quad: AROM, Both, 10 reps, Seated Hip ABduction/ADduction: AROM, Both, 10 reps, Seated Hip Flexion/Marching: AROM, Both, 10 reps, Seated Toe Raises: AROM, Both, 10 reps, Seated Heel Raises: AROM, Both, 10 reps, Seated    General Comments General comments (skin integrity, edema, etc.): pt on 8L O2 via HFNC for transfer to  recliner, 10 L viaHFNC for ambulation to bathroom      Pertinent Vitals/Pain Pain Assessment Pain Assessment: Faces Faces Pain Scale: Hurts a little bit Pain Location: general Pain Descriptors / Indicators: Discomfort Pain Intervention(s): Limited activity within patient's tolerance, Monitored during session, Repositioned     PT Goals (current goals can now be found in the care plan section) Acute Rehab PT Goals PT Goal Formulation: With  patient Time For Goal Achievement: 11/22/23 Potential to Achieve Goals: Good    Frequency    Min 3X/week       AM-PAC PT 6 Clicks Mobility   Outcome Measure  Help needed turning from your back to your side while in a flat bed without using bedrails?: None Help needed moving from lying on your back to sitting on the side of a flat bed without using bedrails?: A Little Help needed moving to and from a bed to a chair (including a wheelchair)?: A Little Help needed standing up from a chair using your arms (e.g., wheelchair or bedside chair)?: A Little Help needed to walk in hospital room?: A Little Help needed climbing 3-5 steps with a railing? : A Little 6 Click Score: 19    End of Session Equipment Utilized During Treatment: Oxygen   Patient left: in bed;with call bell/phone within reach Nurse Communication: Mobility status PT Visit Diagnosis: Difficulty in walking, not elsewhere classified (R26.2)     Time: 7253-6644 PT Time Calculation (min) (ACUTE ONLY): 25 min  Charges:    $Therapeutic Exercise: 8-22 mins $Therapeutic Activity: 8-22 mins PT General Charges $$ ACUTE PT VISIT: 1 Visit                     Forest Redwine B. Jewel Mortimer PT, DPT Acute Rehabilitation Services Please use secure chat or  Call Office [phone]    Verlie Glisson Fleet 11/15/2023, 1:17 PM

## 2023-11-15 NOTE — Progress Notes (Signed)
  Progress Note   Patient: Cory Russell EAV:[id number] DOB: 05-Apr-1991 DOA: 11/08/2023     7 DOS: the patient was seen and examined on 11/15/2023 at 1:05PM      Brief hospital course: 33 y.o. M with HIV/AIDS, epilepsy, nonadherent to medications who presented with seizure.     Assessment and Plan:  Sepsis due to PJP pneumonia AIDS -Consult ID - Continue Biktarvy  and Prezcobix  - Continue doxycycline , prednisone , and Bactrim    Acute respiratory failure with hypoxia Due to PJP pneumonia - Wean oxygen as able  History of syphilis -Defer to ID  Condyloma acuminata Per notes from Dr. [name]: patient complaining of soiling himself often following his surgery with Dr Andy Bannister last year. 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 I reached out to Dr Andy Bannister and she will arrange for outpatient follow up  History of seizures -Continue Depakote   Hyponatremia Hyperkalemia Stable  Severe protein calorie malnutrition -Continue mirtazapine   Depression - Continue fluoxetine      Subjective: Patient complains of pain around his rectum from condyloma.  He has no fever.  He has a lot of dyspnea.     Physical Exam: BP 120/76 (BP Location: Right Arm)   Pulse (!) 109   Temp 98.8 F (37.1 C) (Oral)   Resp 18   Ht 5' 7 (1.702 m)   Wt 48.8 kg   SpO2 97%   BMI 16.85 kg/m   Thin adult male, lying in bed, interactive and appropriate Tachycardic, regular, no murmurs Respiratory rate appears increased, no rales or wheezes appreciated, nasal cannula in place Attention normal, affect normal, judgment and insight appear normal, face symmetric, speech fluent    Data Reviewed: Basic metabolic panel shows creatinine stable at 1.1 Sodium down to 128, elevated D-dimer, CTA shows diffuse groundglass opacities CD4 count undetectable     Family Communication: mother at bedside    Disposition: Status is:  Inpatient         Author: Ephriam Hashimoto, MD 11/15/2023 4:21 PM  For on call review [email or web address].

## 2023-11-15 NOTE — Progress Notes (Signed)
 Nutrition Follow-up  DOCUMENTATION CODES:  Severe malnutrition in context of chronic illness, Underweight  INTERVENTION:  Ensure Plus High Protein po TID, each supplement provides 350 kcal and 20 grams of protein. Magic cup TID with meals, each supplement provides 290 kcal and 9 grams of protein Encourage PO intake Obtain new weight to trend.  NUTRITION DIAGNOSIS:  Severe Malnutrition related to chronic illness (HIV) as evidenced by severe muscle depletion, severe fat depletion. - Ongoing  GOAL:  Patient will meet greater than or equal to 90% of their needs - Ongoing  MONITOR:  PO intake, Supplement acceptance  REASON FOR ASSESSMENT:  Consult Assessment of nutrition requirement/status  ASSESSMENT:  Pt with PMH of HIV with noncompliance, sz disorder, HTN, chronic depression, rectal bleeding from anal condyloma s/p laser ablation/excision who was recently admitted with RLL PNA now admitted for sz and sepsis from PNA.   Met with pt in room, family at bedside. Reports that he has been eating ok, getting food from outside of hospital. Encouraged pt to continue with that if is helpful to get his PO intake better. Is drinking typically two Ensure's per day, not a huge fan but continues to drink them. Willing to try [employer], ok with any flavor but chocolate; RD to order.   Pt with no new weight since admission, RD to order new weight. RN also notified to attempt standing scale weight for most accurate weight.   Meal Intake 6/04: 0% lunch, 10% dinner 6/05: 50% lunch 6/06: 100% breakfast 6/07: 100% dinner 6/11: 0% breakfast  Nutrition Related Medications: Doxycycline , Remeron , Prednisone , IV antibiotics  Labs: Sodium 128, Potassium 5.0, BUN 25, Creatinine 1.14   UOP: 2625 mL x 24 hrs  Diet Order:   Diet Order             Diet regular Room service appropriate? Yes; Fluid consistency: Thin  Diet effective now                   EDUCATION NEEDS: Education needs have been  addressed  Skin:  Skin Assessment: Reviewed RN Assessment  Last BM:  6/8  Height:  Ht Readings from Last 1 Encounters:  11/08/23 5' 7 (1.702 m)   Weight:  Wt Readings from Last 1 Encounters:  11/08/23 49 kg   BMI:  Body mass index is 16.92 kg/m.  Estimated Nutritional Needs:  Kcal:  1800-2200 Protein:  80-100 grams Fluid:  >1.8 L/day   Doneta Furbish RD, LDN Clinical Dietitian

## 2023-11-15 NOTE — Plan of Care (Signed)
  Problem: Activity: Goal: Risk for activity intolerance will decrease Outcome: Progressing   Problem: Elimination: Goal: Will not experience complications related to bowel motility Outcome: Progressing   Problem: Elimination: Goal: Will not experience complications related to urinary retention Outcome: Progressing   Problem: Pain Managment: Goal: General experience of comfort will improve and/or be controlled Outcome: Progressing

## 2023-11-16 DIAGNOSIS — J9601 Acute respiratory failure with hypoxia: Secondary | ICD-10-CM | POA: Insufficient documentation

## 2023-11-16 DIAGNOSIS — E871 Hypo-osmolality and hyponatremia: Secondary | ICD-10-CM | POA: Insufficient documentation

## 2023-11-16 DIAGNOSIS — B59 Pneumocystosis: Secondary | ICD-10-CM | POA: Diagnosis not present

## 2023-11-16 DIAGNOSIS — E875 Hyperkalemia: Secondary | ICD-10-CM | POA: Insufficient documentation

## 2023-11-16 DIAGNOSIS — B20 Human immunodeficiency virus [HIV] disease: Secondary | ICD-10-CM | POA: Diagnosis not present

## 2023-11-16 DIAGNOSIS — A419 Sepsis, unspecified organism: Secondary | ICD-10-CM | POA: Diagnosis not present

## 2023-11-16 DIAGNOSIS — R6520 Severe sepsis without septic shock: Secondary | ICD-10-CM | POA: Diagnosis not present

## 2023-11-16 DIAGNOSIS — A630 Anogenital (venereal) warts: Secondary | ICD-10-CM | POA: Insufficient documentation

## 2023-11-16 LAB — BASIC METABOLIC PANEL WITH GFR
Anion gap: 9 (ref 5–15)
BUN: 22 mg/dL — ABNORMAL HIGH (ref 6–20)
CO2: 27 mmol/L (ref 22–32)
Calcium: 9.3 mg/dL (ref 8.9–10.3)
Chloride: 94 mmol/L — ABNORMAL LOW (ref 98–111)
Creatinine, Ser: 1.09 mg/dL (ref 0.61–1.24)
GFR, Estimated: >60 mL/min (ref >60)
Glucose, Bld: 110 mg/dL — ABNORMAL HIGH (ref 70–99)
Potassium: 4.7 mmol/L (ref 3.5–5.1)
Sodium: 130 mmol/L — ABNORMAL LOW (ref 135–145)

## 2023-11-16 MED ORDER — SIMETHICONE 80 MG PO CHEW
80.0000 mg | CHEWABLE_TABLET | Freq: Once | ORAL | Status: DC
  Filled 2023-11-16: qty 1

## 2023-11-16 MED ORDER — SULFAMETHOXAZOLE-TRIMETHOPRIM 200-40 MG/5ML PO SUSP
30.0000 mL | Freq: Three times a day (TID) | ORAL | Status: DC
  Administered 2023-11-16 – 2023-11-17 (×3): 30 mL via ORAL
  Filled 2023-11-16 (×5): qty 30

## 2023-11-16 MED ORDER — HYDROCOD POLI-CHLORPHE POLI ER 10-8 MG/5ML PO SUER
5.0000 mL | Freq: Two times a day (BID) | ORAL | Status: DC | PRN
  Administered 2023-11-16 – 2023-11-21 (×5): 5 mL via ORAL
  Filled 2023-11-16 (×8): qty 5

## 2023-11-16 NOTE — Progress Notes (Signed)
 ID PROGRESS NOTE  Comfortable this morning. Still on high supplemental O2 Needs. Had isolated fever but not today. Surrounded by family. No complaints.  A/P: severe pcp pneumonia -  Continue with bactrim  and steroids Will check CMV VL Continue on bitkarvy and prezcobix , and if having persistent fever, we will recheck VL and CD 4 count to consider -- immune reconstitution as cause of fevers  Cory Russell B. Levern Reader MD MPH [facility] [phone]

## 2023-11-16 NOTE — Plan of Care (Signed)
  Problem: Clinical Measurements: Goal: Cardiovascular complication will be avoided Outcome: Progressing   Problem: Nutrition: Goal: Adequate nutrition will be maintained Outcome: Progressing   Problem: Coping: Goal: Level of anxiety will decrease Outcome: Progressing   Problem: Elimination: Goal: Will not experience complications related to urinary retention Outcome: Progressing   Problem: Pain Managment: Goal: General experience of comfort will improve and/or be controlled Outcome: Progressing   Problem: Safety: Goal: Ability to remain free from injury will improve Outcome: Progressing

## 2023-11-16 NOTE — Assessment & Plan Note (Signed)
 Due to PJP pneumonia - Wean oxygen as able

## 2023-11-16 NOTE — Progress Notes (Signed)
  Progress Note   Patient: Cory Russell UJW:[id number] DOB: 08-13-1990 DOA: 11/08/2023     8 DOS: the patient was seen and examined on 11/16/2023 at 8:50AM      Brief hospital course: 33 y.o. M with HIV/AIDS, epilepsy, nonadherent to medications who presented with seizure.     Assessment and Plan: * Severe sepsis due to PJP pneumonia AIDS No improvement Completed course of doxycycline  as empiric treatment for rectal gonorrhea ID monitoring for IRIS - Consult ID - Continue Biktarvy  and Prezcobix  - Continue doxycycline , prednisone , and Bactrim   Acute respiratory failure with hypoxia (HCC) Due to PJP pneumonia - Wean oxygen as able  Condyloma acuminata Per notes from Dr. [name]: patient complaining of soiling himself often following his surgery with Dr Andy Bannister last year. 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 I reached out to Dr Andy Bannister and she will arrange for outpatient follow up   Protein-calorie malnutrition, severe - Continue mirtazapine  - Nutritional supplements  Seizure disorder [facility]) Seizure Had seizures prior to admission due to nonadherence to AEDs.  MRI brain normal.  No concern for intracranial infection since admission.  No further seizures since resuming Depakote . - Continue Depakote   History of syphilis - Defer to ID  Depression - Continue fluoxetine          Subjective: Still with generali9zed malaise, tired, short of breath.  No fever today.  No confusion, no vomiting, no seizures.     Physical Exam: BP 133/84 (BP Location: Right Arm)   Pulse (!) 106   Temp 98.8 F (37.1 C) (Oral)   Resp 19   Ht 5' 7 (1.702 m)   Wt 52.1 kg   SpO2 98%   BMI 17.99 kg/m   Thin adult male, lying in bed, interactive and appropriate Tachycardic, no murmurs, regular Respiratory rate hide normal, no rales or wheezes, good air movement Attention normal, affect normal, judgment and insight  normal, upper extremity strength normal, face symmetric, speech fluent    Data Reviewed: Discussed with infectious disease Basic metabolic panel shows stable hyponatremia, normal renal function, potassium high normal  Family Communication: Mother at the bedside    Disposition: Status is: Inpatient         Author: Ephriam Hashimoto, MD 11/16/2023 3:01 PM  For on call review [email or web address].

## 2023-11-16 NOTE — Assessment & Plan Note (Addendum)
-   Continue fluoxetine , Ativan , mirtazapine

## 2023-11-16 NOTE — Assessment & Plan Note (Signed)
-   Continue mirtazapine  - Nutritional supplements

## 2023-11-16 NOTE — Progress Notes (Signed)
    [facility]    Date of Admission:  11/08/2023   Total days of antibiotics 10   ID: Cory Russell is a 33 y.o. male with advanced HIV disease poorly controlled, with severe pcp pneumonia Principal Problem:   Severe sepsis due to PJP pneumonia Active Problems:   Depression   History of syphilis   Seizure disorder (HCC)   Protein-calorie malnutrition, severe   AIDS (acquired immune deficiency syndrome) (HCC)   Condyloma acuminata   Acute respiratory failure with hypoxia (HCC)   Hyponatremia   Hyperkalemia    Subjective: Afebrile, but still requiring 10L supp O2 by [place], easily desaturates when standing up  Medications:   bictegravir-emtricitabine -tenofovir  AF  1 tablet Oral Daily   darunavir -cobicistat   1 tablet Oral Q breakfast   enoxaparin  (LOVENOX ) injection  40 mg Subcutaneous Daily   feeding supplement  237 mL Oral TID BM   FLUoxetine  20 mg Oral Daily   hydrocortisone    Rectal QID   lidocaine   1 patch Transdermal Q24H   mirtazapine   15 mg Oral QHS   nystatin   5 mL Oral QID   predniSONE   40 mg Oral Q breakfast   Followed by   Cecily Cohen ON 11/18/2023] predniSONE   20 mg Oral Q breakfast   sulfamethoxazole -trimethoprim   30 mL Oral Q8H   valproic  acid  500 mg Oral BID    Objective: Vital signs in last 24 hours: Temp:  [98 F (36.7 C)-99.5 F (37.5 C)] 98.4 F (36.9 C) (06/12 1642) Pulse Rate:  [91-108] 106 (06/12 1201) Resp:  [17-20] 18 (06/12 1642) BP: (106-133)/(65-90) 127/90 (06/12 1642) SpO2:  [94 %-99 %] 98 % (06/12 1201) Weight:  [52.1 kg] 52.1 kg (06/12 0401)  Physical Exam  Constitutional: He is oriented to person, place, and time. He appears well-developed and well-nourished. No distress.  HENT:  Mouth/Throat: Oropharynx is clear and moist. No oropharyngeal exudate.  Cardiovascular: tachycardic, regular rhythm and normal heart sounds. Exam reveals no gallop and no friction rub.  No murmur heard.  Pulmonary/Chest: Effort normal  and breath sounds normal. No respiratory distress. He has no wheezes.  Abdominal: Soft. Bowel sounds are normal. He exhibits no distension. There is no tenderness.  Lymphadenopathy:  He has no cervical adenopathy.  Neurological: He is alert and oriented to person, place, and time.  Skin: Skin is warm and dry. No rash noted. No erythema.  Psychiatric: He has a normal mood and affect. His behavior is normal.    Lab Results Recent Labs    11/15/23 0700 11/16/23 0451  NA 128* 130*  K 5.0 4.7  CL 94* 94*  CO2 23 27  BUN 25* 22*  CREATININE 1.14 1.09   Fungitell +>500  Microbiology: reviewed Studies/Results: No results found.   Assessment/Plan: Hiv disease = continue on biktarvy  and prezcobix . VL at 306K  on 6/4. With cd 4 count < 35 on admitt. Has been off of ART for quite sometime. Will continue to administer and watch if any immune reconstitution  Severe pcp pneumonia = continue on bactrim - will change to liquid to see how he tolerates. Continue on pred 40mg  for the time being  Oi proph = will give azithromycin  1200mg  po weekly starting Saturday  Overall course -he is slow to respond  Will check cmv VL, repeat respiratory culture, and CMP  [facility] for Infectious Diseases Pager: [phone]  11/16/2023, 5:24 PM

## 2023-11-16 NOTE — Assessment & Plan Note (Addendum)
 Seizure Had seizures prior to admission due to nonadherence to AEDs.  MRI brain normal.  No concern for intracranial infection since admission.  No further seizures since resuming Depakote . - Continue valproic  acid

## 2023-11-16 NOTE — Care Plan (Signed)
 Patient consistently refusing lovenox 

## 2023-11-16 NOTE — Assessment & Plan Note (Addendum)
 Admitted with subacute onset of symptoms shortness of breath and cough in setting of uncontrolled HIV.  Found to have CD4 count undetectable, bilateral infiltrates and chest imaging.  RVP negative.  Fungitell abnormal, strong suspicion PJP.  Unable to produce sputum early in hospital stay.   Started on Bactrim  and prednisone  but no significant improvement, still requiring >10L.  ID recommend broadening infectious differential and exploring bronchoscpy.  ?IRIS   CTA chest showed bilateral infiltrtes, ruled out PE   - Continue Bactrim  and prednisone  - Consult Pulmonology - Continue Biktarvy  and Prezcobix  - Continue azithromycin  - Consult ID, consult Pulm

## 2023-11-16 NOTE — Assessment & Plan Note (Signed)
 Defer to ID.

## 2023-11-16 NOTE — Progress Notes (Signed)
 PT Cancellation Note  Patient Details Name: Cory Russell MRN: [id number] DOB: Aug 10, 1990   Cancelled Treatment:    Reason Eval/Treat Not Completed: (P) Patient declined, no reason specified Pt reports he has just gotten comfortable and is expecting lunch. Pt asks PT to come back later. PT will follow back as able.  Maryetta Shafer B. Jewel Mortimer PT, DPT Acute Rehabilitation Services Please use secure chat or  Call Office [phone]    Cory Russell [facility] 11/16/2023, 12:06 PM

## 2023-11-16 NOTE — Assessment & Plan Note (Signed)
 Per notes from Dr. [name]: patient complaining of soiling himself often following his surgery with Dr Andy Bannister last year. 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 I reached out to Dr Andy Bannister and she will arrange for outpatient follow up

## 2023-11-17 ENCOUNTER — Inpatient Hospital Stay (HOSPITAL_COMMUNITY)

## 2023-11-17 DIAGNOSIS — A419 Sepsis, unspecified organism: Secondary | ICD-10-CM | POA: Diagnosis not present

## 2023-11-17 DIAGNOSIS — J9601 Acute respiratory failure with hypoxia: Secondary | ICD-10-CM | POA: Diagnosis not present

## 2023-11-17 DIAGNOSIS — R6520 Severe sepsis without septic shock: Secondary | ICD-10-CM | POA: Diagnosis not present

## 2023-11-17 LAB — CBC
HCT: 30.3 % — ABNORMAL LOW (ref 39.0–52.0)
Hemoglobin: 10.5 g/dL — ABNORMAL LOW (ref 13.0–17.0)
MCH: 28.9 pg (ref 26.0–34.0)
MCHC: 34.7 g/dL (ref 30.0–36.0)
MCV: 83.5 fL (ref 80.0–100.0)
Platelets: 330 10*3/uL (ref 150–400)
RBC: 3.63 MIL/uL — ABNORMAL LOW (ref 4.22–5.81)
RDW: 13.3 % (ref 11.5–15.5)
WBC: 8.7 10*3/uL (ref 4.0–10.5)
nRBC: 0 % (ref 0.0–0.2)

## 2023-11-17 LAB — BASIC METABOLIC PANEL WITH GFR
Anion gap: 8 (ref 5–15)
BUN: 22 mg/dL — ABNORMAL HIGH (ref 6–20)
CO2: 25 mmol/L (ref 22–32)
Calcium: 9 mg/dL (ref 8.9–10.3)
Chloride: 94 mmol/L — ABNORMAL LOW (ref 98–111)
Creatinine, Ser: 1.02 mg/dL (ref 0.61–1.24)
GFR, Estimated: >60 mL/min (ref >60)
Glucose, Bld: 119 mg/dL — ABNORMAL HIGH (ref 70–99)
Potassium: 5.1 mmol/L (ref 3.5–5.1)
Sodium: 127 mmol/L — ABNORMAL LOW (ref 135–145)

## 2023-11-17 LAB — EXPECTORATED SPUTUM ASSESSMENT W GRAM STAIN, RFLX TO RESP C

## 2023-11-17 LAB — BLOOD GAS, VENOUS
Acid-Base Excess: 5.4 mmol/L — ABNORMAL HIGH (ref 0.0–2.0)
Bicarbonate: 29.9 mmol/L — ABNORMAL HIGH (ref 20.0–28.0)
Drawn by: 8647
O2 Saturation: 91.2 %
Patient temperature: 38.9
pCO2, Ven: 46 mmHg (ref 44–60)
pH, Ven: 7.43 (ref 7.25–7.43)
pO2, Ven: 67 mmHg — ABNORMAL HIGH (ref 32–45)

## 2023-11-17 MED ORDER — SULFAMETHOXAZOLE-TRIMETHOPRIM 400-80 MG/5ML IV SOLN
15.0000 mg/kg/d | Freq: Three times a day (TID) | INTRAVENOUS | Status: DC
  Administered 2023-11-17 – 2023-11-20 (×9): 272.96 mg via INTRAVENOUS
  Filled 2023-11-17 (×13): qty 17.06

## 2023-11-17 MED ORDER — MIRTAZAPINE 15 MG PO TABS
7.5000 mg | ORAL_TABLET | Freq: Every day | ORAL | Status: DC
  Administered 2023-11-17 – 2023-11-20 (×4): 7.5 mg via ORAL
  Filled 2023-11-17 (×4): qty 1

## 2023-11-17 MED ORDER — CALCIUM CARBONATE ANTACID 500 MG PO CHEW
1.0000 | CHEWABLE_TABLET | Freq: Four times a day (QID) | ORAL | Status: DC | PRN
  Administered 2023-11-17 – 2023-11-25 (×6): 200 mg via ORAL
  Filled 2023-11-17 (×8): qty 1

## 2023-11-17 MED ORDER — VALPROIC ACID 250 MG/5ML PO SOLN
500.0000 mg | Freq: Two times a day (BID) | ORAL | Status: DC
  Administered 2023-11-17 – 2023-11-25 (×15): 500 mg via ORAL
  Filled 2023-11-17 (×19): qty 10

## 2023-11-17 MED ORDER — PREDNISONE 20 MG PO TABS
40.0000 mg | ORAL_TABLET | Freq: Every day | ORAL | Status: DC
  Administered 2023-11-18 – 2023-11-22 (×5): 40 mg via ORAL
  Filled 2023-11-17 (×5): qty 2

## 2023-11-17 NOTE — Progress Notes (Signed)
 Physical Therapy Treatment Patient Details Name: Cory Russell MRN: [id number] DOB: Sep 14, 1990 Today's Date: 11/17/2023   History of Present Illness Pt is a 33 yr old male who presented 11/08/23 with seizure-like activity. EEG pending. Pt was recently was dx with right lobe PNA. PMH: HIV, seizure disorder, HTN, rectal bleeding, depression.    PT Comments  Pt happy to see PT and eager to increased his ambulation distance. Pt continues to require 10L O2 via HFNC at rest SpO2 93-95% with ambulation drops to 85%, but pt is able to recover with purse lip breathing during standing rest break. Pt with goal of walking length of hall but is agreeable to turn back as he fatigues. Pt with increasing LE weakness. PT recommended 5x sit<>stand per hour to increase LE strength and practice deep purse lipped breathing. PT also recommended increased use of IS. D/c plans remain appropriate. PT will continue to follow acutely.    If plan is discharge home, recommend the following: A little help with walking and/or transfers;A little help with bathing/dressing/bathroom (PRN assist)   Can travel by private vehicle      Yes  Equipment Recommendations  None recommended by PT       Precautions / Restrictions Precautions Precautions: Fall Recall of Precautions/Restrictions: Intact Precaution/Restrictions Comments: Mon O2 and HR Restrictions Weight Bearing Restrictions Per Provider Order: No     Mobility  Bed Mobility Overal bed mobility: Modified Independent                  Transfers Overall transfer level: Needs assistance Equipment used: None, Rolling walker (2 wheels) Transfers: Sit to/from Stand             General transfer comment: contact guard for safety and management of wires    Ambulation/Gait Ambulation/Gait assistance: Contact guard assist Gait Distance (Feet): 60 Feet Assistive device: Rolling walker (2 wheels) Gait Pattern/deviations: Step-through pattern, Decreased stride  length, Narrow base of support, Knee hyperextension - right, Knee hyperextension - left Gait velocity: slowed Gait velocity interpretation: <1.8 ft/sec, indicate of risk for recurrent falls   General Gait Details: CGA for safety, pt notes increased weakness in his LE and has increased knee hyperextension for stabilization. Educated on use of UE on walker to promote stabilization rather than locking out his knees. pt able to achieve, 2x stops for increased pursed lip breathing when SpO2 drops to mid 80s. Pt is able to recover ~1 min.         Balance Overall balance assessment: No apparent balance deficits (not formally assessed), Needs assistance   Sitting balance-Leahy Scale: Good       Standing balance-Leahy Scale: Fair                              Hotel manager: No apparent difficulties  Cognition Arousal: Alert Behavior During Therapy: WFL for tasks assessed/performed, Lability   PT - Cognitive impairments: No apparent impairments                         Following commands: Intact      Cueing Cueing Techniques: Verbal cues, Gestural cues  Exercises Other Exercises Other Exercises: sit <>stand Other Exercises: IS x5 max inhalation 750 mL    General Comments General comments (skin integrity, edema, etc.): pt happy with his progress with ambulation. educated on more consistent IS use and provided flutter valve for improved oxygenation  Pertinent Vitals/Pain Pain Assessment Pain Assessment: Faces Faces Pain Scale: Hurts a little bit Pain Location: general Pain Descriptors / Indicators: Discomfort Pain Intervention(s): Limited activity within patient's tolerance, Monitored during session, Repositioned     PT Goals (current goals can now be found in the care plan section) Acute Rehab PT Goals PT Goal Formulation: With patient Time For Goal Achievement: 11/22/23 Potential to Achieve Goals: Good Progress towards  PT goals: Progressing toward goals    Frequency    Min 3X/week       AM-PAC PT 6 Clicks Mobility   Outcome Measure  Help needed turning from your back to your side while in a flat bed without using bedrails?: None Help needed moving from lying on your back to sitting on the side of a flat bed without using bedrails?: A Little Help needed moving to and from a bed to a chair (including a wheelchair)?: A Little Help needed standing up from a chair using your arms (e.g., wheelchair or bedside chair)?: A Little Help needed to walk in hospital room?: A Little Help needed climbing 3-5 steps with a railing? : A Little 6 Click Score: 19    End of Session Equipment Utilized During Treatment: Oxygen Activity Tolerance: Other (comment) (continues to be limited by O2 desaturation) Patient left: in bed;with call bell/phone within reach Nurse Communication: Mobility status PT Visit Diagnosis: Difficulty in walking, not elsewhere classified (R26.2)     Time: 8119-1478 PT Time Calculation (min) (ACUTE ONLY): 26 min  Charges:    $Gait Training: 8-22 mins $Therapeutic Exercise: 8-22 mins PT General Charges $$ ACUTE PT VISIT: 1 Visit                     Cory Russell B. Cory Russell PT, DPT Acute Rehabilitation Services Please use secure chat or  Call Office [phone]    Cory Russell [facility] 11/17/2023, 1:46 PM

## 2023-11-17 NOTE — TOC Progression Note (Signed)
 Transition of Care [facility] - Progression Note    Patient Details  Name: Cory Russell MRN: [id number] Date of Birth: June 24, 1990  Transition of [facility]) CM/SW Contact  Graves-Bigelow, Jari Merles, RN Phone Number: 11/17/2023, 11:46 AM  Clinical Narrative: Patient was discussed in progression rounds. Per notes, patient with severe sepsis due to pneumonia. Patient continues on high flow oxygen. ID continues to follow. Case Manager continues to follow for transition of care needs as the patient progresses.    Expected Discharge Plan: Home/Self Care Barriers to Discharge: No Barriers Identified  Expected Discharge Plan and Services   Discharge Planning Services: CM Consult Post Acute Care Choice: NA Living arrangements for the past 2 months: Single Family Home                   DME Agency: NA       HH Arranged: NA           Social Determinants of Health (SDOH) Interventions SDOH Screenings   Food Insecurity: No Food Insecurity (11/08/2023)  Housing: Low Risk  (11/08/2023)  Transportation Needs: No Transportation Needs (11/08/2023)  Utilities: Not At Risk (11/08/2023)  Depression (PHQ2-9): Low Risk  (04/12/2023)  Financial Resource Strain: Low Risk  (02/27/2023)   Received from [facility]  Tobacco Use: Low Risk  (11/08/2023)    Readmission Risk Interventions     No data to display

## 2023-11-17 NOTE — Progress Notes (Signed)
  Progress Note   Patient: Cory Russell:[id number] DOB: 1991/02/09 DOA: 11/08/2023     9 DOS: the patient was seen and examined on 11/17/2023 at 7:33AM      Brief hospital course: 33 y.o. M with HIV/AIDS, epilepsy, nonadherent to medications who presented with seizure.     Assessment and Plan: * Severe sepsis due to PJP pneumonia AIDS No improvement Completed course of doxycycline  as empiric treatment for rectal gonorrhea  - Continue Bactrim  - Continue prednisone  - Consult ID - Continue Biktarvy  and Prezcobix  - Recollect sputum culture - Follow CMV PCR - Discussed with ID, he is at risk to deteriorate   Hyponatremia Worse today.  Asymptomatic - Continue fluoxetine  and mirtazapine  for now  Acute respiratory failure with hypoxia (HCC) Due to PJP pneumonia VBG shows no hypercapnia, pH seems normal - Wean oxygen as able  Condyloma acuminata Per notes from Dr. [name]: patient complaining of soiling himself often following his surgery with Dr Andy Bannister last year. 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 I reached out to Dr Andy Bannister and she will arrange for outpatient follow up   Protein-calorie malnutrition, severe - Continue mirtazapine  - Nutritional supplements  Seizure disorder [facility]) Seizure Had seizures prior to admission due to nonadherence to AEDs.  MRI brain normal.  No concern for intracranial infection since admission.  No further seizures since resuming Depakote . - Continue valproic  acid  History of syphilis - Defer to ID  Depression - Continue fluoxetine , Ativan , mirtazapine           Subjective: More tachypneic and out of breath today.  Produced some sputum.     Physical Exam: BP 125/88 (BP Location: Right Arm)   Pulse (!) 104   Temp 98.2 F (36.8 C) (Oral)   Resp 19   Ht 5' 7 (1.702 m)   Wt 54.6 kg   SpO2 90%   BMI 18.85 kg/m   Thin adult male, lying in bed,  interactive, appears tired Tachycardic, no murmurs, heart rhythm regular, no peripheral edema Respiratory rate increased, seems short of breath, no rales or wheezes, good air movement Abdomen soft Attention normal, affect appropriate, judgment and insight appear normal    Data Reviewed: Discussed with infectious disease Basic metabolic panel shows worsening hyponatremia, stable renal function CBC shows no leukocytosis, thrombocytopenia Mild anemia Chest x-ray shows persistent bilateral opacities VBG shows no CO2 elevation     Family Communication: Mother at the bedside    Disposition: Status is: Inpatient         Author: Ephriam Hashimoto, MD 11/17/2023 2:38 PM  For on call review [email or web address].

## 2023-11-17 NOTE — Progress Notes (Signed)
 Occupational Therapy Treatment Patient Details Name: Cory Russell MRN: [id number] DOB: 11/21/1990 Today's Date: 11/17/2023   History of present illness Pt is a 33 yr old male who presented 11/08/23 with seizure-like activity. EEG pending. Pt was recently was dx with right lobe PNA. PMH: HIV, seizure disorder, HTN, rectal bleeding, depression.   OT comments  Pt c/o fatigue, worked with PT earlier, but was able to motivate himself to complete more OOB activities. Pt ambulated slowly with RW around room 30 feet, no LOB, supervision for safety, on 10L O2, desat to 89% after activities, labored breathing, instructed on pursed lip breathing, improved O2 to 90-94%, ranging, in bed. Pt educated on BUE strengthening exercises with green theraband, good technique and follow through with exercises. Pt able to complete ADLs at bedside with set up, good overall strength/flexibility to complete ADLs, limited due to decreased activity tolerance. Pt would benefit from further acute OT to maximize participation with OOB upright ADLs, no follow up OT expected, tub bench and RW recommended for DC.      If plan is discharge home, recommend the following:  Assistance with cooking/housework;Assist for transportation   Equipment Recommendations  Tub/shower seat;Other (comment) (RW)    Recommendations for Other Services      Precautions / Restrictions Precautions Precautions: Fall Recall of Precautions/Restrictions: Intact Precaution/Restrictions Comments: Mon O2 and HR Restrictions Weight Bearing Restrictions Per Provider Order: No       Mobility Bed Mobility Overal bed mobility: Modified Independent                  Transfers Overall transfer level: Needs assistance Equipment used: Rolling walker (2 wheels) Transfers: Sit to/from Stand, Bed to chair/wheelchair/BSC Sit to Stand: Supervision     Step pivot transfers: Supervision     General transfer comment: supervision for safety, poor  activity tolerance     Balance Overall balance assessment: Needs assistance   Sitting balance-Leahy Scale: Good     Standing balance support: Bilateral upper extremity supported, During functional activity Standing balance-Leahy Scale: Fair Standing balance comment: desats and fatigues quickly, but no LOB               High Level Balance Comments: O           ADL either performed or assessed with clinical judgement   ADL Overall ADL's : Needs assistance/impaired                                       General ADL Comments: set up/supervision for safety, decreased activity tolerance and desats with OOB activity limiting independence, otherwise has good strength/flexibilty to complete all tasks.    Extremity/Trunk Assessment Upper Extremity Assessment Upper Extremity Assessment: Overall WFL for tasks assessed            Vision       Perception     Praxis     Communication Communication Communication: No apparent difficulties   Cognition Arousal: Alert Behavior During Therapy: WFL for tasks assessed/performed, Lability Cognition: No apparent impairments                               Following commands: Intact        Cueing   Cueing Techniques: Verbal cues  Exercises Exercises: Other exercises Other Exercises Other Exercises: general BUE strengthening exercises with green theraband.  Shoulder Instructions       General Comments O2 dropped to 89% with 30 feet ambulation on 10L O2, improved to 90-94% resting in bed after a few minutes.    Pertinent Vitals/ Pain       Pain Assessment Pain Assessment: No/denies pain  Home Living                                          Prior Functioning/Environment              Frequency  Min 2X/week        Progress Toward Goals  OT Goals(current goals can now be found in the care plan section)  Progress towards OT goals: Progressing toward  goals  Acute Rehab OT Goals Patient Stated Goal: to improve activity tolerance OT Goal Formulation: With patient Time For Goal Achievement: 11/22/23 Potential to Achieve Goals: Good ADL Goals Pt Will Perform Tub/Shower Transfer: with modified independence;with supervision;ambulating Additional ADL Goal #1: Pt will voice 3 energy conservation stratigies with the return to home Additional ADL Goal #2: Pt will be able to complete 2 ADL tasks in standing with stable o2 readings.  Plan      Co-evaluation                 AM-PAC OT 6 Clicks Daily Activity     Outcome Measure   Help from another person eating meals?: None Help from another person taking care of personal grooming?: None Help from another person toileting, which includes using toliet, bedpan, or urinal?: A Little Help from another person bathing (including washing, rinsing, drying)?: A Little Help from another person to put on and taking off regular upper body clothing?: A Little Help from another person to put on and taking off regular lower body clothing?: A Little 6 Click Score: 20    End of Session Equipment Utilized During Treatment: Gait belt;Rolling walker (2 wheels)  OT Visit Diagnosis: Unsteadiness on feet (R26.81);Other (comment) (decreased activity tolerance)   Activity Tolerance Patient limited by fatigue   Patient Left in bed;with call bell/phone within reach;with family/visitor present   Nurse Communication Mobility status        Time: 1455-1520 OT Time Calculation (min): 25 min  Charges: OT General Charges $OT Visit: 1 Visit OT Treatments $Self Care/Home Management : 8-22 mins $Therapeutic Activity: 8-22 mins  Gerard Cantara, OTR/L   Scherry Augusto 11/17/2023, 3:32 PM

## 2023-11-17 NOTE — Progress Notes (Signed)
 [facility]  Date of Admission:  11/08/2023      Total days of antibiotics 11            ASSESSMENT: Cory Russell is a 33 y.o. male admitted with:   Advanced HIV disease, AIDS  VL at 306K, CD4 < 35 on admitt on 6/4, Has been off of ART for quite sometime. Continue Biktarvy  + Prezcobix  taken together with food.  Azithromycin  1200 mg weekly starting tomorrow 6/14.  Repeat HIV VL in AM to follow response to ART.  Had high fever, they are intermittent. Would be early for IRIS   Severe pcp pneumonia - Hypoxic Respiratory Failure -  Pleuritic Chest Pain, Ongoing. Remains on 10 LPM HFNC with adequate oxygen sats. Tachycardic today with high fever x 1. CXR reviewed personally - diffuse hazy dense airspace disease w/o any new complicating feature. No PTX. Chest CT w/o cavitary lesions.  RVP negative.  Can try Chest PT if he would like to try - D/W Dr. Darlyn Eke FU recently collected sputum sample for any new information.   Fevers - Ongoing intermittent high fevers, recently 102.2 F. Day 11 of ART initiation - can se IRIS, little early but could be part of the picture. Will hold the prednisone  at 40 mg daily and customize taper when he is clinically improved. No CNS symptoms. Only localizing features to chest (cough / pain).  CXR diffuse dense airspace disease w/o worsening hypoxia the last few days.  FU HIV RNA FU CMV PCR    Prophylaxis with Severe Immunosuppression -  will give azithromycin  1200mg  po weekly starting Saturday  Depression / Anxiety / Poor Appetite -  Will decrease the remeron  dose to 7.5 mg at bedtime for now. HE is having problems with pill burden and PO medications. He requests xanax - will defer to Dr. Darlyn Eke what he feels is most appropriate given his respiratory failure.      PLAN: Decrease remeron  to 7.5 mg at bedtime Will try to arrange chest PT to see if that helps him  Hold prednisone  at 40 mg with custom taper   Change Bactrim  back to IV for now -discussed we need to get this on PO at some point when his more ready for discharge  Continue biktarvy  + prezcobix     Principal Problem:   Severe sepsis due to PJP pneumonia Active Problems:   Depression   History of syphilis   Seizure disorder (HCC)   Protein-calorie malnutrition, severe   AIDS (acquired immune deficiency syndrome) (HCC)   Condyloma acuminata   Acute respiratory failure with hypoxia (HCC)   Hyponatremia   Hyperkalemia   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  He is also requesting consideration of just using xanax and  oxycocone to help with nerves and pain. Feels that the mirtazapine  and prozac  and ativan  are all too much.  He is not coughing up much, mostly dry. Requesting chest PT to see if that helps. Was able to get a sputum sample coughed up.  Felt poorly with high fever earlier today.   Mom at the bedside today   Review of Systems: Review of Systems  Constitutional:  Positive for chills, fever and malaise/fatigue.  Respiratory:  Positive for cough and shortness of breath. Negative for sputum production.   Gastrointestinal:  Positive for nausea. Negative for vomiting.  Neurological:  Negative for dizziness and headaches.    No Known Allergies  OBJECTIVE: Vitals:   11/16/23 2347 11/17/23 0558 11/17/23 0810 11/17/23 0956  BP: 121/85  124/69 106/71   Pulse:      Resp: 18 16 18    Temp: 98.4 F (36.9 C) 98.5 F (36.9 C) 99.9 F (37.7 C) (!) 102.2 F (39 C)  TempSrc: Oral Oral Oral Oral  SpO2:      Weight:  54.6 kg    Height:       Body mass index is 18.85 kg/m.  Physical Exam Constitutional:      Appearance: He is ill-appearing.  Pulmonary:     Effort: Pulmonary effort is normal. No respiratory distress.     Comments: On 10 LPM HFNC, diminished diffuse breath sounds  Skin:    General: Skin is warm and dry.   Neurological:     Mental Status: He is alert and oriented to person, place, and time.     Lab Results Lab Results  Component Value Date   WBC 8.7 11/17/2023   HGB 10.5 (L) 11/17/2023   HCT 30.3 (L) 11/17/2023   MCV 83.5 11/17/2023   PLT 330 11/17/2023    Lab Results  Component Value Date   CREATININE 1.02 11/17/2023   BUN 22 (H) 11/17/2023   NA 127 (L) 11/17/2023   K 5.1 11/17/2023   CL 94 (L) 11/17/2023   CO2 25 11/17/2023    Lab Results  Component Value Date   ALT 20 11/10/2023   AST 32 11/10/2023   ALKPHOS 43 11/10/2023   BILITOT 0.3 11/10/2023     Microbiology: Recent Results (from the past 240 hours)  Culture, blood (Routine X 2) w Reflex to ID Panel     Status: None   Collection Time: 11/08/23  4:18 AM   Specimen: BLOOD  Result Value Ref Range Status   Specimen Description BLOOD BLOOD RIGHT ARM  Final   Special Requests   Final    BOTTLES DRAWN AEROBIC AND ANAEROBIC Blood Culture adequate volume   Culture   Final    NO GROWTH 5 DAYS Performed at [facility], [address], [place], [place] [address]    Report Status 11/13/2023 FINAL  Final  Culture, blood (Routine X 2) w Reflex to ID Panel     Status: None   Collection Time: 11/08/23  4:22 AM   Specimen: BLOOD  Result Value Ref Range Status   Specimen Description BLOOD BLOOD RIGHT ARM  Final   Special Requests   Final    BOTTLES DRAWN AEROBIC AND ANAEROBIC Blood Culture adequate volume   Culture   Final     NO GROWTH 5 DAYS Performed at [facility], [address], [place], [place] [address]    Report Status 11/13/2023 FINAL  Final  Respiratory (~20 pathogens) panel by PCR  Status: None   Collection Time: 11/08/23  8:42 AM   Specimen: Nasopharyngeal Swab; Respiratory  Result Value Ref Range Status   Adenovirus NOT DETECTED NOT DETECTED Final   Coronavirus 229E NOT DETECTED NOT DETECTED Final    Comment: (NOTE) The Coronavirus on the Respiratory Panel, DOES NOT test for the novel  Coronavirus (2019 nCoV)    Coronavirus HKU1 NOT DETECTED NOT DETECTED Final   Coronavirus NL63 NOT DETECTED NOT DETECTED Final   Coronavirus OC43 NOT DETECTED NOT DETECTED Final   Metapneumovirus NOT DETECTED NOT DETECTED Final   Rhinovirus / Enterovirus NOT DETECTED NOT DETECTED Final   Influenza A NOT DETECTED NOT DETECTED Final   Influenza B NOT DETECTED NOT DETECTED Final   Parainfluenza Virus 1 NOT DETECTED NOT DETECTED Final   Parainfluenza Virus 2 NOT DETECTED NOT DETECTED Final   Parainfluenza Virus 3 NOT DETECTED NOT DETECTED Final   Parainfluenza Virus 4 NOT DETECTED NOT DETECTED Final   Respiratory Syncytial Virus NOT DETECTED NOT DETECTED Final   Bordetella pertussis NOT DETECTED NOT DETECTED Final   Bordetella Parapertussis NOT DETECTED NOT DETECTED Final   Chlamydophila pneumoniae NOT DETECTED NOT DETECTED Final   Mycoplasma pneumoniae NOT DETECTED NOT DETECTED Final    Comment: Performed at [facility], [address] [place], [place] [address]    Gibson Kurtz, MSN, NP-C [facility] Pager: [phone]  @TODAY @ 1:14 PM

## 2023-11-17 NOTE — Plan of Care (Signed)

## 2023-11-17 NOTE — Assessment & Plan Note (Signed)
 Worse today.  Asymptomatic - Continue fluoxetine  and mirtazapine  for now

## 2023-11-18 DIAGNOSIS — J9601 Acute respiratory failure with hypoxia: Secondary | ICD-10-CM | POA: Diagnosis not present

## 2023-11-18 DIAGNOSIS — R6520 Severe sepsis without septic shock: Secondary | ICD-10-CM | POA: Diagnosis not present

## 2023-11-18 DIAGNOSIS — A419 Sepsis, unspecified organism: Secondary | ICD-10-CM | POA: Diagnosis not present

## 2023-11-18 LAB — COMPREHENSIVE METABOLIC PANEL WITH GFR
ALT: 15 U/L (ref 0–44)
AST: 25 U/L (ref 15–41)
Albumin: 2.2 g/dL — ABNORMAL LOW (ref 3.5–5.0)
Alkaline Phosphatase: 57 U/L (ref 38–126)
Anion gap: 10 (ref 5–15)
BUN: 21 mg/dL — ABNORMAL HIGH (ref 6–20)
CO2: 26 mmol/L (ref 22–32)
Calcium: 9.3 mg/dL (ref 8.9–10.3)
Chloride: 91 mmol/L — ABNORMAL LOW (ref 98–111)
Creatinine, Ser: 1.02 mg/dL (ref 0.61–1.24)
GFR, Estimated: >60 mL/min (ref >60)
Glucose, Bld: 107 mg/dL — ABNORMAL HIGH (ref 70–99)
Potassium: 5.2 mmol/L — ABNORMAL HIGH (ref 3.5–5.1)
Sodium: 127 mmol/L — ABNORMAL LOW (ref 135–145)
Total Bilirubin: 0.4 mg/dL (ref 0.0–1.2)
Total Protein: 7.3 g/dL (ref 6.5–8.1)

## 2023-11-18 LAB — CBC
HCT: 31.3 % — ABNORMAL LOW (ref 39.0–52.0)
Hemoglobin: 10.8 g/dL — ABNORMAL LOW (ref 13.0–17.0)
MCH: 28.3 pg (ref 26.0–34.0)
MCHC: 34.5 g/dL (ref 30.0–36.0)
MCV: 82.2 fL (ref 80.0–100.0)
Platelets: 359 10*3/uL (ref 150–400)
RBC: 3.81 MIL/uL — ABNORMAL LOW (ref 4.22–5.81)
RDW: 13.5 % (ref 11.5–15.5)
WBC: 8.6 10*3/uL (ref 4.0–10.5)
nRBC: 0 % (ref 0.0–0.2)

## 2023-11-18 LAB — CMV DNA, QUANTITATIVE, PCR
CMV DNA Quant: 4090 [IU]/mL
Log10 CMV Qn DNA Pl: 3.612 {Log_IU}/mL

## 2023-11-18 MED ORDER — AZITHROMYCIN 600 MG PO TABS
1200.0000 mg | ORAL_TABLET | ORAL | Status: DC
  Administered 2023-11-18 – 2023-11-25 (×2): 1200 mg via ORAL
  Filled 2023-11-18 (×2): qty 2

## 2023-11-18 NOTE — Progress Notes (Signed)
 1610: Oral temp 100.52F. Pt declines Tylenol  at this time.  1043: Recheck temp per pt request. Oral temp 100.48F. Pt continues to decline Tylenol  at this time.   Fernande Howells, MD aware.

## 2023-11-18 NOTE — Plan of Care (Signed)
  Problem: Clinical Measurements: Goal: Respiratory complications will improve Outcome: Progressing   Problem: Pain Managment: Goal: General experience of comfort will improve and/or be controlled Outcome: Progressing   Problem: Safety: Goal: Ability to remain free from injury will improve Outcome: Progressing   Problem: Skin Integrity: Goal: Risk for impaired skin integrity will decrease Outcome: Progressing

## 2023-11-18 NOTE — Progress Notes (Addendum)
 ID PROGRESS NOTE   Patient still having intermittent fevers, ongoing hypoxia  CMV VL of 4,000 noted after being on ART for the past 10-12 days. 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 However, if we suspect end organ involvement - possible pneumonia - will start ganciclovir tomorrow if can get bronch and get  BAL for fungal/aerobic/CMV   Oi proph = will need weekly azithromycin  1200mg  today  Intermittent fever = will check AFB blood cx for MAC; will check abdomen/pelvis CT to look for adenopathy that may need biopsy.   Gerold Kos Levern Reader MD MPH [facility] [phone]

## 2023-11-18 NOTE — Progress Notes (Signed)
  Progress Note   Patient: Cory Russell ZOX:[id number] DOB: 13-Jul-1990 DOA: 11/08/2023     10 DOS: the patient was seen and examined on 11/18/2023 at 10:56AM      Brief hospital course: 33 y.o. M with HIV/AIDS, epilepsy, nonadherent to medications who presented with seizure.     Assessment and Plan: * Severe sepsis due to PJP pneumonia AIDS Still on 10L No improvement but no deterioration either He has elevated risk for worsening respiratory failure requiring intubation D/w ID, we agree, this is likely combination PJP pneumonia and immune reconstitution.  Intubation would lead to high risk of tracheostomy.   - Continue Bactrim , pred, ART    Hyponatremia Stable - Continue fluoxetine  and mirtazapine  for now  Seizure disorder (HCC) None since admission - Continue valproic  acid  Depression - Continue fluoxetine , Ativan , mirtazapine           Subjective: Still dyspneic, no change.  Very dyspneic after movement.  Still cehst tightness. Not producing a lot of sputum.     Physical Exam: BP 124/85   Pulse (!) 113   Temp 100.2 F (37.9 C) (Oral)   Resp (!) 23   Ht 5' 7 (1.702 m)   Wt 54.1 kg   SpO2 93%   BMI 18.68 kg/m   Thin adult male, lying in recliner, interactive but is very dyspneic Tachycardic, regular, no murmurs, no peripheral edema Respiratory elevated, using pursed lip breathing, out of breath with talking Lung sounds diminished but no rales or wheezes appreciated Abdomen soft without tenderness palpation or guarding Attention normal, affect normal   Data Reviewed: Basic metabolic panel showed stable hyponatremia, slightly worse hyperkalemia Renal function normal CBC shows persistent anemia, no change, white count normal, platelets normal     Family Communication:     Disposition: Status is: Inpatient         Author: Ephriam Hashimoto, MD 11/18/2023 3:30 PM  For on call review [email or web address].

## 2023-11-19 DIAGNOSIS — R6520 Severe sepsis without septic shock: Secondary | ICD-10-CM | POA: Diagnosis not present

## 2023-11-19 DIAGNOSIS — E43 Unspecified severe protein-calorie malnutrition: Secondary | ICD-10-CM

## 2023-11-19 DIAGNOSIS — F32A Depression, unspecified: Secondary | ICD-10-CM

## 2023-11-19 DIAGNOSIS — B59 Pneumocystosis: Secondary | ICD-10-CM | POA: Diagnosis not present

## 2023-11-19 DIAGNOSIS — J9601 Acute respiratory failure with hypoxia: Secondary | ICD-10-CM | POA: Diagnosis not present

## 2023-11-19 DIAGNOSIS — R509 Fever, unspecified: Secondary | ICD-10-CM

## 2023-11-19 DIAGNOSIS — B20 Human immunodeficiency virus [HIV] disease: Secondary | ICD-10-CM | POA: Diagnosis not present

## 2023-11-19 DIAGNOSIS — A419 Sepsis, unspecified organism: Secondary | ICD-10-CM | POA: Diagnosis not present

## 2023-11-19 DIAGNOSIS — B259 Cytomegaloviral disease, unspecified: Secondary | ICD-10-CM

## 2023-11-19 LAB — CULTURE, RESPIRATORY W GRAM STAIN: Gram Stain: NONE SEEN

## 2023-11-19 LAB — HIV-1 RNA QUANT-NO REFLEX-BLD
HIV 1 RNA Quant: 640 {copies}/mL
LOG10 HIV-1 RNA: 2.806 {Log_copies}/mL

## 2023-11-19 LAB — BASIC METABOLIC PANEL WITH GFR
Anion gap: 9 (ref 5–15)
BUN: 21 mg/dL — ABNORMAL HIGH (ref 6–20)
CO2: 25 mmol/L (ref 22–32)
Calcium: 8.9 mg/dL (ref 8.9–10.3)
Chloride: 90 mmol/L — ABNORMAL LOW (ref 98–111)
Creatinine, Ser: 1.15 mg/dL (ref 0.61–1.24)
GFR, Estimated: >60 mL/min (ref >60)
Glucose, Bld: 104 mg/dL — ABNORMAL HIGH (ref 70–99)
Potassium: 5.3 mmol/L — ABNORMAL HIGH (ref 3.5–5.1)
Sodium: 124 mmol/L — ABNORMAL LOW (ref 135–145)

## 2023-11-19 LAB — GLUCOSE, CAPILLARY: Glucose-Capillary: 157 mg/dL — ABNORMAL HIGH (ref 70–99)

## 2023-11-19 MED ORDER — SODIUM CHLORIDE 1 G PO TABS
1.0000 g | ORAL_TABLET | Freq: Two times a day (BID) | ORAL | Status: DC
  Administered 2023-11-19 – 2023-11-22 (×7): 1 g via ORAL
  Filled 2023-11-19 (×7): qty 1

## 2023-11-19 MED ORDER — LORAZEPAM 2 MG/ML IJ SOLN
2.0000 mg | Freq: Four times a day (QID) | INTRAMUSCULAR | Status: DC | PRN
  Filled 2023-11-19: qty 1

## 2023-11-19 NOTE — Plan of Care (Signed)

## 2023-11-19 NOTE — Progress Notes (Signed)
 [facility]    Date of Admission:  11/08/2023   Total days of antibiotics 12   ID: Cory Russell is a 33 y.o. 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 Still on 10L. In pleasant spirits Medications:   azithromycin   1,200 mg Oral Weekly   bictegravir-emtricitabine -tenofovir  AF  1 tablet Oral Daily   darunavir -cobicistat   1 tablet Oral Q breakfast   feeding supplement  237 mL Oral TID BM   hydrocortisone    Rectal QID   lidocaine   1 patch Transdermal Q24H   mirtazapine   7.5 mg Oral QHS   nystatin   5 mL Oral QID   predniSONE   40 mg Oral Q breakfast   simethicone   80 mg Oral Once   valproic  acid  500 mg Oral BID    Objective: Vital signs in last 24 hours: Temp:  [98 F (36.7 C)-100.1 F (37.8 C)] 98 F (36.7 C) (06/15 1232) Pulse Rate:  [94-120] 102 (06/15 1232) Resp:  [20-22] 22 (06/15 1232) BP: (114-140)/(75-93) 127/82 (06/15 1232) SpO2:  [89 %-98 %] 98 % (06/15 1232) Weight:  [55.8 kg] 55.8 kg (06/15 0336)  Physical Exam  Constitutional: He is oriented to person, place, and time. He appears well-developed and under nourished. No distress.  HENT:  Mouth/Throat: Oropharynx is clear and moist. No oropharyngeal exudate.  Cardiovascular: Normal rate, regular rhythm and normal heart sounds. Exam reveals no gallop and no friction rub.  No murmur heard.  Pulmonary/Chest: Effort normal and breath sounds normal. No respiratory distress. He has no wheezes.  Abdominal: Soft.  Bowel sounds are normal. He exhibits no distension. There is no tenderness.  Lymphadenopathy:  He has no cervical adenopathy.  Neurological: He is alert and oriented to person, place, and time.  Skin: Skin is warm and dry. No rash noted. No erythema.  Psychiatric: He has a normal mood and affect. His behavior is normal.    Lab Results Recent Labs    11/17/23 0512 11/18/23 0414 11/19/23 0514  WBC 8.7 8.6  --   HGB 10.5* 10.8*  --   HCT 30.3* 31.3*  --   NA 127* 127* 124*  K 5.1 5.2* 5.3*  CL 94* 91* 90*  CO2 25 26 25   BUN 22* 21* 21*  CREATININE 1.02 1.02 1.15   Liver Panel Recent Labs    11/18/23 0414  PROT 7.3  ALBUMIN 2.2*  AST 25  ALT 15  ALKPHOS 57  BILITOT 0.4   Sedimentation Rate No results for input(s): ESRSEDRATE in the last 72 hours. C-Reactive Protein No results for input(s): CRP in the last 72 hours.  Microbiology: Fungitell> 500 Studies/Results: No results found.   Assessment/Plan: Severe pneumocystis pneumonia = continue on iv bactrim  for now. Did not tolerate liquid. He is currently on pred 40 but should slate to trend down to 20mg  once HIV VL returns  Intermittent fever = maybe early for immune reconstitution, VL is pending from 6/13. Will check AFB blood  cx. His CMV VL in 4,000. Typically HIV patients can see some CMV viremia when they have poorly controlled HIV disease. We don't necessarily see any end organ damage. CMV retinitis would be the most common. It would is consider rare to see cmv pneumonitis in non-transplant population. Should he worsen, then consider bronch and checking for CMV. After discussion with drs danford and desai, patient is too high risk to stay on ventilator if he is to  get bronchoscopy. 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 Anticipate as his immune system improves VL would trend down. At this time,  respiratory distress/ 10L [place] is thought to be all PJP.  Depression = continue with fluoxetine  20mg  daily  Appetite stimulant = mirtazipine 7.5mg  daily  Oi proph = azithromycin  1200mg  q wk  evaluation of this patient requires complex antimicrobial therapy evaluation and counseling and isolation needs for disease transmission risk assessment and mitigation.    [facility] Pager: [phone]  11/19/2023, 2:30 PM

## 2023-11-19 NOTE — Plan of Care (Signed)
  Problem: Clinical Measurements: Goal: Respiratory complications will improve Outcome: Progressing Goal: Cardiovascular complication will be avoided Outcome: Progressing   Problem: Activity: Goal: Risk for activity intolerance will decrease Outcome: Progressing   Problem: Nutrition: Goal: Adequate nutrition will be maintained Outcome: Progressing   Problem: Pain Managment: Goal: General experience of comfort will improve and/or be controlled Outcome: Progressing   Problem: Safety: Goal: Ability to remain free from injury will improve Outcome: Progressing   Problem: Skin Integrity: Goal: Risk for impaired skin integrity will decrease Outcome: Progressing

## 2023-11-19 NOTE — Progress Notes (Signed)
  Progress Note   Patient: Cory Russell [id number]:[id number] DOB: July 06, 1990 DOA: 11/08/2023     11 DOS: the patient was seen and examined on 11/19/2023 at 8:25AM      Brief hospital course: 33 y.o. M with HIV/AIDS, epilepsy, nonadherent to medications who presented with seizure.     Assessment and Plan: * Severe sepsis due to PJP pneumonia AIDS Admitted with subacute onset of symptoms shortness of breath and cough in setting of uncontrolled HIV.  Found to have CD4 count undetectable, bilateral infiltrates and chest imaging.  RVP negative.  Fungitell abnormal, strong suspicion PJP.  Unable to produce sputum early in hospital stay.  Started on Bactrim  and prednisone  but no significant improvement, still requiring >10L.  ID recommend broadening infectious differential and exploring bronchoscpy.  ?IRIS  CTA chest showed bilateral infiltrtes, ruled out PE  - Continue Bactrim  and prednisone  - Consult Pulmonology - Continue Biktarvy  and Prezcobix  - Continue azithromycin  - Consult ID, consult Pulm   Hyponatremia Trending down - Hold fluoxetine  - Continue mirtazapine   Acute respiratory failure with hypoxia (HCC) See above  Condyloma acuminata Per notes from Dr. [name]: patient complaining of soiling himself often following his surgery with Dr Andy Bannister last year. 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 I reached out to Dr Andy Bannister and she will arrange for outpatient follow up   Protein-calorie malnutrition, severe - Continue mirtazapine  - Nutritional supplements  Seizure disorder [facility]) Seizure Had seizures prior to admission due to nonadherence to AEDs.  MRI brain normal.  No concern for intracranial infection since admission.  No further seizures since resuming Depakote . - Continue valproic  acid  History of syphilis - Defer to ID  Depression - Continue Ativan , mirtazapine   Hyperkalemia Mild - Tele -  Trend        Subjective: Feels better with Chest PT but RT are not doing it.  Tmax 100.12F overnight. Till with chest tightness and      Physical Exam: BP (!) 134/92 (BP Location: Right Arm)   Pulse 100   Temp 98.2 F (36.8 C) (Oral)   Resp (!) 22   Ht 5' 7 (1.702 m)   Wt 55.8 kg   SpO2 91%   BMI 19.27 kg/m   Thin adult male, sitting up in bed, interactive but dyspneic Tachycardic, no murmurs, regular, no peripheral edema Respiratory rate elevated, lungs diminished, today I hear some faint crackles in anterior and lateral lung fields Abdomen soft, no tenderness palpation or guarding Attention normal, affect normal, judgment and insight appear normal, moves all extremities normal strength and coordination, alert and oriented x 4    Data Reviewed: Discussed with infectious disease and pulmonology team Basic metabolic panel shows worsening hyponatremia, worsening hyperkalemia on Bactrim  Creatinine stable     Family Communication:  Mother    Disposition: Status is: Inpatient         Author: Ephriam Hashimoto, MD 11/19/2023 11:19 AM  For on call review [email or web address].

## 2023-11-19 NOTE — Progress Notes (Signed)
 NAME:  Cory Russell, MRN:  [id number], DOB:  Oct 20, 1990, LOS: 11 ADMISSION DATE:  11/08/2023, CONSULTATION DATE:  11/19/2023 REFERRING MD:  Ephriam Hashimoto, *, CHIEF COMPLAINT:  respiratory failure   History of Present Illness:  Cory Russell is a 33 y.o. man with past medical history of HIV/AIDS, CD4<35 on admission, not on ARVs. Presents with seizure like activity 11 days ago and found to have sepsis from pneumonia.  He notes symptoms started about 2-3 weeks ago with shortness of breath and dry cough. He had two separate rounds of antibiotics visiting other urgent care and ED visits. He has tried albuterol  nebulizer treatments from friends and initially found them helpful.   CT Chest shows bilateral ground glass opacities concerning for pneumoncystis pneumonia. Since being here he has been Started on empiric antibiotics with bactrim  and steroids. Also resumed ARVs during this admission. Has been persistently hypoxemic on 10LNC. Pulmonary consulted to assist with evaluation and management and for bronchoscopy.   He feels persistently short of breath, worse with exertion. He feels like chest PT is helping him. Feeling more anxious. Also having difficulty taking pills.   Pertinent  Medical History  HIV, Seizure disorder  Significant Hospital Events: Including procedures, antibiotic start and stop dates in addition to other pertinent events     Interim History / Subjective:  As above in HPI  Objective    Blood pressure (!) 134/92, pulse 100, temperature 98.2 F (36.8 C), temperature source Oral, resp. rate (!) 22, height 5' 7 (1.702 m), weight 55.8 kg, SpO2 91%.        Intake/Output Summary (Last 24 hours) at 11/19/2023 1116 Last data filed at 11/19/2023 1019 Gross per 24 hour  Intake 2548 ml  Output 1850 ml  Net 698 ml   Filed Weights   11/17/23 0558 11/18/23 0314 11/19/23 0336  Weight: 54.6 kg 54.1 kg 55.8 kg    Examination: General: thin man, on High flow oxygen  10 HENT: thrush in oropharynx Lungs: tachypnic, shallow respirations. No wheeze Cardiovascular: tachycardic, regular Abdomen: soft, nontender Extremities: thin, no edema Neuro: normal speech no focal asymmetry  Fungitell >500 CT Chest personally reviewed shows diffuse GGOs c/w pneumocystis pna.   CD4<35 Respiratory culture pending from expectorated sputum RVP 20 negative Blood cultures ngtd   Resolved problem list   Assessment and Plan   Acute hypoxemic respiratory failure Bilateral GGOs AIDS CD4<35, resumed biktarvy  this admission Suspected IRIS CMV Viremia  - titrate down oxygen for goal sats over 90%.  - continue bactrim , prednisone  as ordered. Agree with waiting to taper steroids until some clinical improvement.  - given low CD4 count certainly at risk for other pulmonary OI including KS although imaging not c/w KS could still have endobronchial disease. CMV Pneumonitis much less likely. At this point I think bronchoscopy would increase risk for intubation, barotrauma. I'm not sure how our management would change with bronchoscopy. Transbronchial biopsies in the setting of pneumocystis would be very high risk. - CMV is difficult to isolate in pulmonary samples and does not correlate well with disease.  Usually treated presumptively in solid organ/BMT patients with CMV Viremia given the limited diagnostic utility of BAL isolation of CMV.  - Discussed with Dr. Levern Reader about concern for IRIS. Repeat Viral load is pending.  - ok to continue prn bronchodilators and chest pt since patient perceives some benefit.  - he notes difficulty taking pills and requests medications be IV when possible.  - we will follow   Tuere Nwosu,  MD Pulmonary and Critical Care Medicine [facility] 11/19/2023 11:56 AM Pager: see AMION  If no response to pager, please call critical care on call (see AMION) until 7pm After 7:00 pm call Elink      Labs   CBC: Recent Labs  Lab  11/13/23 1526 11/17/23 0512 11/18/23 0414  WBC 12.3* 8.7 8.6  NEUTROABS 11.7*  --   --   HGB 10.2* 10.5* 10.8*  HCT 29.5* 30.3* 31.3*  MCV 83.6 83.5 82.2  PLT 328 330 359    Basic Metabolic Panel: Recent Labs  Lab 11/15/23 0700 11/16/23 0451 11/17/23 0512 11/18/23 0414 11/19/23 0514  NA 128* 130* 127* 127* 124*  K 5.0 4.7 5.1 5.2* 5.3*  CL 94* 94* 94* 91* 90*  CO2 23 27 25 26 25   GLUCOSE 114* 110* 119* 107* 104*  BUN 25* 22* 22* 21* 21*  CREATININE 1.14 1.09 1.02 1.02 1.15  CALCIUM 9.1 9.3 9.0 9.3 8.9   GFR: Estimated Creatinine Clearance: 72.1 mL/min (by C-G formula based on SCr of 1.15 mg/dL). Recent Labs  Lab 11/13/23 1526 11/17/23 0512 11/18/23 0414  WBC 12.3* 8.7 8.6    Liver Function Tests: Recent Labs  Lab 11/18/23 0414  AST 25  ALT 15  ALKPHOS 57  BILITOT 0.4  PROT 7.3  ALBUMIN 2.2*   No results for input(s): LIPASE, AMYLASE in the last 168 hours. No results for input(s): AMMONIA in the last 168 hours.  ABG    Component Value Date/Time   HCO3 29.9 (H) 11/17/2023 1013   TCO2 29 12/23/2021 1027   O2SAT 91.2 11/17/2023 1013     Coagulation Profile: No results for input(s): INR, PROTIME in the last 168 hours.  Cardiac Enzymes: No results for input(s): CKTOTAL, CKMB, CKMBINDEX, TROPONINI in the last 168 hours.  HbA1C: No results found for: HGBA1C  CBG: No results for input(s): GLUCAP in the last 168 hours.  Review of Systems:   +cough, short of breath 20 lb weight loss Thrush Decreased appetite  Past Medical History:  He,  has a past medical history of Anal pain, Cocaine abuse (HCC), Depression, GERD (gastroesophageal reflux disease), HIV (human immunodeficiency virus infection) (HCC), and Hypertension.   Surgical History:   Past Surgical History:  Procedure Laterality Date   LASER ABLATION CONDOLAMATA N/A 12/23/2021   Procedure: LASER ABLATION CONDOLAMATA;  Surgeon: Joyce Nixon, MD;  Location: [facility]  [place];  Service: General;  Laterality: N/A;   MASS EXCISION N/A 06/30/2021   Procedure: EXCISION OF ANAL CONDYLOMA , ABLATION OF ANAL CONDYLOMA;  Surgeon: Joyce Nixon, MD;  Location: [facility] [place];  Service: General;  Laterality: N/A;     Social History:   reports that he has never smoked. He has never used smokeless tobacco. He reports that he does not currently use alcohol. He reports current drug use. Drugs: Marijuana and Cocaine.   Family History:  His family history includes Alcoholism in his brother; Diabetes in his mother; Hypertension in his brother and father; Prostate cancer in his father.   Allergies No Known Allergies   Home Medications  Prior to Admission medications   Medication Sig Start Date End Date Taking? Authorizing Provider  cetirizine (ZYRTEC) 10 MG tablet Take 1 tablet by mouth daily. 10/25/23 11/24/23 Yes [provider]  ondansetron  (ZOFRAN ) 4 MG tablet Take 1 tablet (4 mg total) by mouth daily as needed for nausea or vomiting. Take 30 minutes prior to medication Patient taking differently: Take 4 mg by  mouth daily as needed for nausea or vomiting. 01/04/23  Yes Comer, Judithann Novas, MD  predniSONE  (DELTASONE ) 5 MG tablet Take per taper instructions 10/31/23  Yes [provider]  valproic  acid (DEPAKENE ) 250 MG/5ML solution Take 5 mLs (250 mg total) by mouth 4 (four) times daily. 04/12/23  Yes Liane Redman, MD  amoxicillin  (AMOXIL ) 500 MG capsule Take by mouth. Patient not taking: Reported on 11/08/2023 10/25/23   [provider]  bictegravir-emtricitabine -tenofovir  AF (BIKTARVY ) 50-200-25 MG TABS tablet Take 1 tablet by mouth daily. Patient not taking: Reported on 11/08/2023 04/12/23   Liane Redman, MD  mirtazapine  (REMERON ) 15 MG tablet Take 1 tablet (15 mg total) by mouth at bedtime. Patient not taking: Reported on 11/08/2023 04/12/23   Liane Redman, MD  sulfamethoxazole -trimethoprim  (BACTRIM  DS) 800-160 MG tablet  Take 1 tablet by mouth 3 (three) times a week. Patient not taking: Reported on 11/08/2023 01/20/23   Liane Redman, MD  zolpidem  (AMBIEN ) 5 MG tablet Take 1 tablet (5 mg total) by mouth at bedtime as needed for sleep. Patient not taking: Reported on 11/08/2023 01/19/23   Liane Redman, MD

## 2023-11-19 NOTE — Progress Notes (Addendum)
 Pt states he feels weird and weaker than usual. He attributes symptoms to being tired, but notes he has had similar symptoms to before he has had a seizure in the past. He took a short nap of about 15 minutes and states he feels a little better. CBG 157. VSS. Fernande Howells, MD notified. See new orders.

## 2023-11-20 DIAGNOSIS — A419 Sepsis, unspecified organism: Secondary | ICD-10-CM | POA: Diagnosis not present

## 2023-11-20 DIAGNOSIS — E43 Unspecified severe protein-calorie malnutrition: Secondary | ICD-10-CM | POA: Diagnosis not present

## 2023-11-20 DIAGNOSIS — B20 Human immunodeficiency virus [HIV] disease: Secondary | ICD-10-CM | POA: Diagnosis not present

## 2023-11-20 DIAGNOSIS — R6520 Severe sepsis without septic shock: Secondary | ICD-10-CM | POA: Diagnosis not present

## 2023-11-20 DIAGNOSIS — B59 Pneumocystosis: Secondary | ICD-10-CM | POA: Diagnosis not present

## 2023-11-20 DIAGNOSIS — J9601 Acute respiratory failure with hypoxia: Secondary | ICD-10-CM | POA: Diagnosis not present

## 2023-11-20 LAB — BASIC METABOLIC PANEL WITH GFR
Anion gap: 11 (ref 5–15)
BUN: 21 mg/dL — ABNORMAL HIGH (ref 6–20)
CO2: 23 mmol/L (ref 22–32)
Calcium: 9.1 mg/dL (ref 8.9–10.3)
Chloride: 90 mmol/L — ABNORMAL LOW (ref 98–111)
Creatinine, Ser: 1.05 mg/dL (ref 0.61–1.24)
GFR, Estimated: >60 mL/min (ref >60)
Glucose, Bld: 99 mg/dL (ref 70–99)
Potassium: 6.1 mmol/L — ABNORMAL HIGH (ref 3.5–5.1)
Sodium: 124 mmol/L — ABNORMAL LOW (ref 135–145)

## 2023-11-20 MED ORDER — SODIUM ZIRCONIUM CYCLOSILICATE 10 G PO PACK
10.0000 g | PACK | Freq: Every day | ORAL | Status: DC
  Administered 2023-11-20: 10 g via ORAL
  Filled 2023-11-20: qty 1

## 2023-11-20 MED ORDER — PRIMAQUINE PHOSPHATE 26.3 (15 BASE) MG PO TABS
30.0000 mg | ORAL_TABLET | Freq: Every day | ORAL | Status: DC
  Administered 2023-11-20 – 2023-11-21 (×2): 30 mg via ORAL
  Filled 2023-11-20 (×3): qty 2

## 2023-11-20 MED ORDER — CYCLOBENZAPRINE HCL 10 MG PO TABS
5.0000 mg | ORAL_TABLET | Freq: Once | ORAL | Status: AC
  Administered 2023-11-20: 5 mg via ORAL
  Filled 2023-11-20: qty 1

## 2023-11-20 MED ORDER — CLINDAMYCIN PHOSPHATE 600 MG/50ML IV SOLN
600.0000 mg | Freq: Four times a day (QID) | INTRAVENOUS | Status: DC
  Administered 2023-11-20 – 2023-11-22 (×7): 600 mg via INTRAVENOUS
  Filled 2023-11-20 (×9): qty 50

## 2023-11-20 NOTE — Progress Notes (Signed)
 Physical Therapy Treatment Patient Details Name: Cory Russell MRN: [id number] DOB: July 30, 1990 Today's Date: 11/20/2023   History of Present Illness Pt is a 33 yr old male who presented 11/08/23 with seizure-like activity. EEG pending. Pt was recently was dx with right lobe PNA. PMH: HIV, seizure disorder, HTN, rectal bleeding, depression.    PT Comments  Pt supine in bed on entry with grandmother in room. Pt reports he does not want to walk in hallway as he had a bad experience with desaturation over night. Pt agreeable to leg presses with bed in Reverse Trendelenburg. Worked on deep breath recovery after activity requiring increased WoB. Pt with better O2 saturations and decreased anxiety. PT will continue to follow acutely.      If plan is discharge home, recommend the following: A little help with walking and/or transfers;A little help with bathing/dressing/bathroom (PRN assist)   Can travel by private vehicle      Yes  Equipment Recommendations  None recommended by PT       Precautions / Restrictions Precautions Precautions: Fall Recall of Precautions/Restrictions: Intact Precaution/Restrictions Comments: Mon O2 and HR Restrictions Weight Bearing Restrictions Per Provider Order: No     Mobility  Bed Mobility Overal bed mobility: Modified Independent             General bed mobility comments: able to long sit, pull himself up in the bed.             Balance Overall balance assessment: No apparent balance deficits (not formally assessed), Needs assistance   Sitting balance-Leahy Scale: Good       Standing balance-Leahy Scale: Fair                              Hotel manager: No apparent difficulties  Cognition Arousal: Alert Behavior During Therapy: WFL for tasks assessed/performed, Lability   PT - Cognitive impairments: No apparent impairments                         Following commands: Intact       Cueing Cueing Techniques: Verbal cues, Gestural cues  Exercises Other Exercises Other Exercises: leg presses, with bed in Reverse Trendelenberg, initial set of 10, drop in SpO2 to 83%O2, worked on breathing to recover and decrease anxiety, pt agreed 10 was too many at one time, able to perform 5 more with SpO2 dropping only to 88%O2    General Comments General comments (skin integrity, edema, etc.): Pt with bad experience of desaturation overnight and is very anxious about getting out of bed today. Worked decreasing respiratory rate with onset of anxiety, performing deep breathing mirroring inhalation to exhalation      Pertinent Vitals/Pain Pain Assessment Pain Assessment: Faces Faces Pain Scale: Hurts a little bit Pain Location: general Pain Descriptors / Indicators: Discomfort Pain Intervention(s): Limited activity within patient's tolerance, Monitored during session, Repositioned     PT Goals (current goals can now be found in the care plan section) Acute Rehab PT Goals PT Goal Formulation: With patient Time For Goal Achievement: 11/22/23 Potential to Achieve Goals: Good Progress towards PT goals: Not progressing toward goals - comment    Frequency    Min 3X/week       AM-PAC PT 6 Clicks Mobility   Outcome Measure  Help needed turning from your back to your side while in a flat bed without using bedrails?: None Help needed moving from  lying on your back to sitting on the side of a flat bed without using bedrails?: A Little Help needed moving to and from a bed to a chair (including a wheelchair)?: A Little Help needed standing up from a chair using your arms (e.g., wheelchair or bedside chair)?: A Little Help needed to walk in hospital room?: A Little Help needed climbing 3-5 steps with a railing? : A Little 6 Click Score: 19    End of Session Equipment Utilized During Treatment: Oxygen Activity Tolerance: Other (comment) (continues to be limited by O2  desaturation) Patient left: in bed;with call bell/phone within reach Nurse Communication: Mobility status PT Visit Diagnosis: Difficulty in walking, not elsewhere classified (R26.2)     Time: 1353-1440 PT Time Calculation (min) (ACUTE ONLY): 47 min  Charges:    $Therapeutic Exercise: 38-52 mins PT General Charges $$ ACUTE PT VISIT: 1 Visit                     Othal Kubitz B. Jewel Mortimer PT, DPT Acute Rehabilitation Services Please use secure chat or  Call Office [phone]    Verlie Glisson [facility] 11/20/2023, 2:54 PM

## 2023-11-20 NOTE — Progress Notes (Signed)
  Progress Note   Patient: Cory Russell:[id number] DOB: 02-Mar-1991 DOA: 11/08/2023     12 DOS: the patient was seen and examined on 11/20/2023 at 11:02AM      Brief hospital course: 33 y.o. M with HIV/AIDS, epilepsy, nonadherent to medications who presented with subacute cough and dyspnea, finally had seizure and presented to the hospital.    Found to have pneumonia with high O2 needs, bilateral infiltrates, undetectable CD4 count.  Admitted on treatment for PJP.       Assessment and Plan: * Severe sepsis due to PJP pneumonia No clinical change - Stop Bactrim  due to K - Start clindamycin and primaquine - Check G6PD - Continue prednisone , Biktarvy , Prezcobix , azithromycin  - Defer Bactrim  replacement to ID - Consult pulm, ID - Continue chest PT      Hyperkalemia K up to 6.1 today. Cr stable Given hyponatermia and hx seizures, I feel risk of fluids/Lasix  for K efflux isn't safe. - Stop Bactrim  - Check ECG - Lokelma  - Trend BMP   Hyponatremia No change from yesterday.   Asymptomatic, but given seizures, need to correct. - Hold fluoxetine  - Start salt tabs - Strict I/Os - Continue mirtazapine  for now   Seizure disorder (HCC) Seizure See above - Correct Na - Continue valproic  acid   Depression - Continue Ativan , mirtazapine        Subjective: Having some increased back pain today because he was not taking his opiates.  Feels better after chest physiotherapy.  No change in oxygen requirements.     Physical Exam: BP (!) 125/90 (BP Location: Left Arm)   Pulse (!) 105   Temp 99.1 F (37.3 C) (Oral)   Resp 18   Ht 5' 7 (1.702 m)   Wt 50.8 kg   SpO2 94%   BMI 17.54 kg/m   Thin adult male, lying in bed, interactive and appropriate RRR, no murmurs, no peripheral edema Respiratory tachypneic, seems short of breath, still on high flow nasal cannula, lungs clear without rales or wheezes Abdomen soft with tenderness palpation guarding, no ascites or  distention Tensional, affect, judgment insight appear normal   Data Reviewed: Stress with infectious disease team Basic metabolic panel shows hyponatremia, potassium up to 6.1, creatinine stable    Family Communication: Mother at the bedside    Disposition: Status is: Inpatient         Author: Ephriam Hashimoto, MD 11/20/2023 2:33 PM  For on call review [email or web address].

## 2023-11-20 NOTE — Progress Notes (Signed)
   NAME:  Cory Russell, MRN:  [id number], DOB:  1991-04-28, LOS: 12 ADMISSION DATE:  11/08/2023, CONSULTATION DATE:  11/20/2023 REFERRING MD:  Ephriam Hashimoto, *, CHIEF COMPLAINT:  respiratory failure   History of Present Illness:  Cory Russell is a 33 y.o. man with past medical history of HIV/AIDS, CD4<35 on admission, not on ARVs. Presents with seizure like activity 11 days ago and found to have sepsis from pneumonia.  He notes symptoms started about 2-3 weeks ago with shortness of breath and dry cough. He had two separate rounds of antibiotics visiting other urgent care and ED visits. He has tried albuterol  nebulizer treatments from friends and initially found them helpful.   CT Chest shows bilateral ground glass opacities concerning for pneumoncystis pneumonia. Since being here he has been Started on empiric antibiotics with bactrim  and steroids. Also resumed ARVs during this admission. Has been persistently hypoxemic on 10LNC. Pulmonary consulted to assist with evaluation and management and for bronchoscopy.   He feels persistently short of breath, worse with exertion. He feels like chest PT is helping him. Feeling more anxious. Also having difficulty taking pills.   Pertinent  Medical History  HIV, Seizure disorder  Significant Hospital Events: Including procedures, antibiotic start and stop dates in addition to other pertinent events     Interim History / Subjective:   Remains on high oxygen.  Requiring 10 L high flow  Objective    Blood pressure 114/72, pulse (!) 114, temperature 98.9 F (37.2 C), temperature source Oral, resp. rate 20, height 5' 7 (1.702 m), weight 50.8 kg, SpO2 92%.        Intake/Output Summary (Last 24 hours) at 11/20/2023 0858 Last data filed at 11/20/2023 0300 Gross per 24 hour  Intake 600 ml  Output 2075 ml  Net -1475 ml   Filed Weights   11/18/23 0314 11/19/23 0336 11/20/23 0500  Weight: 54.1 kg 55.8 kg 50.8 kg    Examination: Blood  pressure 114/72, pulse (!) 114, temperature 98.9 F (37.2 C), temperature source Oral, resp. rate 20, height 5' 7 (1.702 m), weight 50.8 kg, SpO2 92%. Gen:      No acute distress HEENT:  EOMI, sclera anicteric Neck:     No masses; no thyromegaly Lungs:    Clear to auscultation bilaterally; normal respiratory effort CV:         Regular rate and rhythm; no murmurs Abd:      + bowel sounds; soft, non-tender; no palpable masses, no distension Ext:    No edema; adequate peripheral perfusion Neuro: alert and oriented x 3 Psych: normal mood and affect   Lab/imaging reviewed Significant for sodium 124, potassium 4.3, BUN/creatinine 21/1.15 Hemoglobin 10.8 No new imaging Fungitell >500 Prior CTA diffuse groundglass opacities.    Resolved problem list   Assessment and Plan  Acute hypoxemic respiratory failure Bilateral GGOs AIDS CD4<35, resumed biktarvy  this admission Suspected IRIS CMV Viremia Possible IRIS  Wean down oxygen as tolerated Continue Bactrim , prednisone  per infectious disease for empiric treatment of pneumocystis.  The scan findings and elevated Fungitell are consistent with this. Antiretroviral treatment Will hold off on bronchoscopy as it is risky with high oxygen requirements.  Signature:   Donya Tomaro MD [place] [facility] See Amion for pager  If no response to pager , please call [phone] until 7pm After 7:00 pm call Elink  [phone] 11/20/2023, 9:08 AM

## 2023-11-20 NOTE — Assessment & Plan Note (Addendum)
 K up to 6.1 today. Cr stable Given hyponatermia and hx seizures, I feel risk of fluids/Lasix  for K efflux isn't safe. - Stop Bactrim  - Check ECG - Lokelma  - Trend BMP

## 2023-11-20 NOTE — Progress Notes (Signed)
 Pharmacy: Antimicrobial Stewardship Note  36 YOM with HIV - noncompliant with ART regimen - CD4 <35 on 6/4 currently being treated for presumed PJP PNA given low CD4 count and fungitell >500.   The patient was started on sulfamethoxazole /trimethoprim  on 6/4 and is currently on D#13/21 of treatment. K has notably been >5 since 6/13 with trend upward to 6.1 today - concern for contribution from SMX/TMP so transitioning to clindamycin + primaquine for PJP treatment.   Ordered G6PD to ensure levels adequate with primaquine  Plan - D/c sulfamethoxazole /trimethoprim   - Start Clindamycin 600 mg IV every 6 hours - Start primaquine 30 mg po daily - Will monitor K trends to see about resuming SMX/TMP  Thank you for allowing pharmacy to be a part of this patient's care.  Garland Junk, PharmD, BCPS, BCIDP Infectious Diseases Clinical Pharmacist 11/20/2023 2:47 PM   **Pharmacist phone directory can now be found on [email or web address] ([email or web address]).  Listed under [facility].

## 2023-11-21 DIAGNOSIS — A419 Sepsis, unspecified organism: Secondary | ICD-10-CM | POA: Diagnosis not present

## 2023-11-21 DIAGNOSIS — R6520 Severe sepsis without septic shock: Secondary | ICD-10-CM | POA: Diagnosis not present

## 2023-11-21 DIAGNOSIS — J9601 Acute respiratory failure with hypoxia: Secondary | ICD-10-CM | POA: Diagnosis not present

## 2023-11-21 DIAGNOSIS — B59 Pneumocystosis: Secondary | ICD-10-CM | POA: Diagnosis not present

## 2023-11-21 DIAGNOSIS — E43 Unspecified severe protein-calorie malnutrition: Secondary | ICD-10-CM | POA: Diagnosis not present

## 2023-11-21 DIAGNOSIS — B20 Human immunodeficiency virus [HIV] disease: Secondary | ICD-10-CM | POA: Diagnosis not present

## 2023-11-21 LAB — BASIC METABOLIC PANEL WITH GFR
Anion gap: 8 (ref 5–15)
BUN: 23 mg/dL — ABNORMAL HIGH (ref 6–20)
CO2: 29 mmol/L (ref 22–32)
Calcium: 9.6 mg/dL (ref 8.9–10.3)
Chloride: 90 mmol/L — ABNORMAL LOW (ref 98–111)
Creatinine, Ser: 1.05 mg/dL (ref 0.61–1.24)
GFR, Estimated: >60 mL/min
Glucose, Bld: 103 mg/dL — ABNORMAL HIGH (ref 70–99)
Potassium: 4.8 mmol/L (ref 3.5–5.1)
Sodium: 127 mmol/L — ABNORMAL LOW (ref 135–145)

## 2023-11-21 LAB — CBC
HCT: 31.5 % — ABNORMAL LOW (ref 39.0–52.0)
Hemoglobin: 10.9 g/dL — ABNORMAL LOW (ref 13.0–17.0)
MCH: 28.8 pg (ref 26.0–34.0)
MCHC: 34.6 g/dL (ref 30.0–36.0)
MCV: 83.1 fL (ref 80.0–100.0)
Platelets: 345 10*3/uL (ref 150–400)
RBC: 3.79 MIL/uL — ABNORMAL LOW (ref 4.22–5.81)
RDW: 14.1 % (ref 11.5–15.5)
WBC: 9.1 10*3/uL (ref 4.0–10.5)
nRBC: 0 % (ref 0.0–0.2)

## 2023-11-21 MED ORDER — ALPRAZOLAM 0.5 MG PO TABS
0.5000 mg | ORAL_TABLET | Freq: Three times a day (TID) | ORAL | Status: DC | PRN
  Administered 2023-11-21 – 2023-11-24 (×6): 0.5 mg via ORAL
  Filled 2023-11-21 (×10): qty 1

## 2023-11-21 MED ORDER — HYDROCOD POLI-CHLORPHE POLI ER 10-8 MG/5ML PO SUER: 5.0000 mL | Freq: Three times a day (TID) | ORAL | Status: DC | PRN

## 2023-11-21 MED ORDER — HYDROCOD POLI-CHLORPHE POLI ER 10-8 MG/5ML PO SUER
5.0000 mL | Freq: Four times a day (QID) | ORAL | Status: DC | PRN
  Administered 2023-11-21 – 2023-11-25 (×8): 5 mL via ORAL
  Filled 2023-11-21 (×9): qty 5

## 2023-11-21 MED ORDER — CYCLOBENZAPRINE HCL 10 MG PO TABS
5.0000 mg | ORAL_TABLET | Freq: Three times a day (TID) | ORAL | Status: DC
  Administered 2023-11-21 – 2023-11-25 (×11): 5 mg via ORAL
  Filled 2023-11-21 (×17): qty 1

## 2023-11-21 NOTE — Progress Notes (Signed)
   NAME:  Cory Russell, MRN:  [id number], DOB:  August 16, 1990, LOS: 13 ADMISSION DATE:  11/08/2023, CONSULTATION DATE:  11/21/2023 REFERRING MD:  Ephriam Hashimoto, *, CHIEF COMPLAINT:  respiratory failure   History of Present Illness:  Cory Russell is a 33 y.o. man with past medical history of HIV/AIDS, CD4<35 on admission, not on ARVs. Presents with seizure like activity 11 days ago and found to have sepsis from pneumonia.  He notes symptoms started about 2-3 weeks ago with shortness of breath and dry cough. He had two separate rounds of antibiotics visiting other urgent care and ED visits. He has tried albuterol  nebulizer treatments from friends and initially found them helpful.   CT Chest shows bilateral ground glass opacities concerning for pneumoncystis pneumonia. Since being here he has been Started on empiric antibiotics with bactrim  and steroids. Also resumed ARVs during this admission. Has been persistently hypoxemic on 10LNC. Pulmonary consulted to assist with evaluation and management and for bronchoscopy.   He feels persistently short of breath, worse with exertion. He feels like chest PT is helping him. Feeling more anxious. Also having difficulty taking pills.   Pertinent  Medical History  HIV, Seizure disorder  Significant Hospital Events: Including procedures, antibiotic start and stop dates in addition to other pertinent events     Interim History / Subjective:   Still on 10L o2, No new events   Objective    Blood pressure 137/89, pulse (!) 122, temperature 97.7 F (36.5 C), temperature source Axillary, resp. rate 20, height 5' 7 (1.702 m), weight 50.8 kg, SpO2 96%.        Intake/Output Summary (Last 24 hours) at 11/21/2023 0834 Last data filed at 11/20/2023 2045 Gross per 24 hour  Intake --  Output 1575 ml  Net -1575 ml   Filed Weights   11/18/23 0314 11/19/23 0336 11/20/23 0500  Weight: 54.1 kg 55.8 kg 50.8 kg    Examination: Blood pressure 137/89, pulse  (!) 122, temperature 97.7 F (36.5 C), temperature source Axillary, resp. rate 20, height 5' 7 (1.702 m), weight 50.8 kg, SpO2 96%. Gen:      No acute distress HEENT:  EOMI, sclera anicteric Neck:     No masses; no thyromegaly Lungs:    Clear to auscultation bilaterally; normal respiratory effort CV:         Regular rate and rhythm; no murmurs Abd:      + bowel sounds; soft, non-tender; no palpable masses, no distension Ext:    No edema; adequate peripheral perfusion Neuro: alert and oriented x 3 Psych: normal mood and affect   Lab/imaging reviewed Significant for sodium 124, potassium 4.3, BUN/creatinine 21/1.15 Hemoglobin 10.8 No new imaging Fungitell >500 Prior CTA diffuse groundglass opacities.    Resolved problem list   Assessment and Plan  Acute hypoxemic respiratory failure Bilateral GGOs AIDS CD4<35, resumed biktarvy  this admission Suspected IRIS CMV Viremia Possible IRIS  Wean down oxygen as tolerated Continue Bactrim , prednisone  per infectious disease for empiric treatment of pneumocystis.  The scan findings and elevated Fungitell are consistent with this. Antiretroviral treatment Will hold off on bronchoscopy as it is risky with high oxygen requirements.  Signature:   Luka Reisch MD [place] [facility] See Amion for pager  If no response to pager , please call [phone] until 7pm After 7:00 pm call Elink  [phone] 11/21/2023, 8:34 AM

## 2023-11-21 NOTE — Progress Notes (Signed)
 OT Cancellation Note  Patient Details Name: Cory Russell MRN: [id number] DOB: Jul 12, 1990   Cancelled Treatment:    Reason Eval/Treat Not Completed: Patient declined, no reason specified (Patient declining OT at this time stating he recently had an episode of HR increasing to 160s. OT to attempt later today as schedule permits)  Jovita Nipper 11/21/2023, 11:24 AM   Anitra Barn, OTA Acute Rehabilitation Services  Office [phone]

## 2023-11-21 NOTE — TOC Progression Note (Signed)
 Transition of Care [facility]) - Progression Note    Patient Details  Name: Cory Russell MRN: [id number] Date of Birth: January 02, 1991  Transition of Care [facility]) CM/SW Contact  Graves-Bigelow, Jari Merles, RN Phone Number: 11/21/2023, 10:38 AM  Clinical Narrative: Patient was discussed in progression rounds. Patient continues on high flow oxygen. ID continues to follow. Case Manager continues to follow for transition of care needs as the patient progresses.     Expected Discharge Plan: Home/Self Care Barriers to Discharge: No Barriers Identified  Expected Discharge Plan and Services   Discharge Planning Services: CM Consult Post Acute Care Choice: NA Living arrangements for the past 2 months: Single Family Home                   DME Agency: NA       HH Arranged: NA     Social Determinants of Health (SDOH) Interventions SDOH Screenings   Food Insecurity: No Food Insecurity (11/08/2023)  Housing: Low Risk  (11/08/2023)  Transportation Needs: No Transportation Needs (11/08/2023)  Utilities: Not At Risk (11/08/2023)  Depression (PHQ2-9): Low Risk  (04/12/2023)  Financial Resource Strain: Low Risk  (02/27/2023)   Received from [facility]  Tobacco Use: Low Risk  (11/08/2023)    Readmission Risk Interventions     No data to display

## 2023-11-21 NOTE — Plan of Care (Signed)
  Problem: Education: Goal: Knowledge of General Education information will improve Description: Including pain rating scale, medication(s)/side effects and non-pharmacologic comfort measures Outcome: Progressing   Problem: Health Behavior/Discharge Planning: Goal: Ability to manage health-related needs will improve Outcome: Progressing   Problem: Nutrition: Goal: Adequate nutrition will be maintained Outcome: Progressing   Problem: Coping: Goal: Level of anxiety will decrease Outcome: Progressing   Problem: Elimination: Goal: Will not experience complications related to bowel motility Outcome: Progressing Goal: Will not experience complications related to urinary retention Outcome: Progressing   Problem: Pain Managment: Goal: General experience of comfort will improve and/or be controlled Outcome: Progressing   Problem: Safety: Goal: Ability to remain free from injury will improve Outcome: Progressing   Problem: Skin Integrity: Goal: Risk for impaired skin integrity will decrease Outcome: Progressing

## 2023-11-21 NOTE — Progress Notes (Signed)
 Occupational Therapy Treatment Patient Details Name: Cory Russell MRN: [id number] DOB: Jul 07, 1990 Today's Date: 11/21/2023   History of present illness Pt is a 33 yr old male who presented 11/08/23 with seizure-like activity. EEG pending. Pt was recently was dx with right lobe PNA. PMH: HIV, seizure disorder, HTN, rectal bleeding, depression.   OT comments  Patient received in supine on 10 liters of O2 with SpO2 95%. Patient agreeable to OT treatment but asking to ambulate in hallway. Patient able to get to EOB without assistance and donn slip on shoes. O2 increased to 15 liters and patient was able to ambulate to nurses station with CGA for safety and SpO2 at 85%. Patient was taken back to room with transport chair and patient was able to transfer to recliner with family present. Discharge recommendations continue to be appropriate. Acute OT to continue to follow to address established goals.       If plan is discharge home, recommend the following:  Assistance with cooking/housework;Assist for transportation   Equipment Recommendations  Tub/shower seat;Other (comment) (RW)    Recommendations for Other Services      Precautions / Restrictions Precautions Precautions: Fall Recall of Precautions/Restrictions: Intact Precaution/Restrictions Comments: Mon O2 and HR Restrictions Weight Bearing Restrictions Per Provider Order: No       Mobility Bed Mobility Overal bed mobility: Modified Independent             General bed mobility comments: able to get to EOB without assistance    Transfers Overall transfer level: Needs assistance Equipment used: Rolling walker (2 wheels) Transfers: Sit to/from Stand, Bed to chair/wheelchair/BSC Sit to Stand: Supervision     Step pivot transfers: Supervision     General transfer comment: supervision and assistance with lines     Balance Overall balance assessment: No apparent balance deficits (not formally assessed), Needs  assistance Sitting-balance support: Feet supported Sitting balance-Leahy Scale: Good     Standing balance support: Single extremity supported, Bilateral upper extremity supported, During functional activity Standing balance-Leahy Scale: Fair                             ADL either performed or assessed with clinical judgement   ADL Overall ADL's : Needs assistance/impaired                     Lower Body Dressing: Set up;Sitting/lateral leans Lower Body Dressing Details (indicate cue type and reason): to donn slip on shoes               General ADL Comments: focused on mobility    Extremity/Trunk Assessment              Vision       Perception     Praxis     Communication Communication Communication: No apparent difficulties   Cognition Arousal: Alert Behavior During Therapy: WFL for tasks assessed/performed, Lability Cognition: No apparent impairments             OT - Cognition Comments: pleased with increased mobility and endurance                 Following commands: Intact        Cueing   Cueing Techniques: Verbal cues, Gestural cues  Exercises      Shoulder Instructions       General Comments on 10 liters in bed with SpO2 95%. O2 increased to 15 liters during mobility with SpO2  at 84-85%.    Pertinent Vitals/ Pain       Pain Assessment Pain Assessment: Faces Faces Pain Scale: Hurts a little bit Pain Location: general Pain Descriptors / Indicators: Discomfort Pain Intervention(s): Monitored during session, Repositioned  Home Living                                          Prior Functioning/Environment              Frequency  Min 2X/week        Progress Toward Goals  OT Goals(current goals can now be found in the care plan section)  Progress towards OT goals: Progressing toward goals  Acute Rehab OT Goals Patient Stated Goal: to increase mobility OT Goal Formulation: With  patient Time For Goal Achievement: 11/22/23 Potential to Achieve Goals: Good ADL Goals Pt Will Perform Tub/Shower Transfer: with modified independence;with supervision;ambulating Additional ADL Goal #1: Pt will voice 3 energy conservation stratigies with the return to home Additional ADL Goal #2: Pt will be able to complete 2 ADL tasks in standing with stable o2 readings.  Plan      Co-evaluation                 AM-PAC OT 6 Clicks Daily Activity     Outcome Measure   Help from another person eating meals?: None Help from another person taking care of personal grooming?: None Help from another person toileting, which includes using toliet, bedpan, or urinal?: A Little Help from another person bathing (including washing, rinsing, drying)?: A Little Help from another person to put on and taking off regular upper body clothing?: A Little Help from another person to put on and taking off regular lower body clothing?: A Little 6 Click Score: 20    End of Session Equipment Utilized During Treatment: Gait belt;Rolling walker (2 wheels)  OT Visit Diagnosis: Unsteadiness on feet (R26.81);Other (comment) (decreased activity tolerance) Pain - Right/Left:  (generalized)   Activity Tolerance Patient tolerated treatment well   Patient Left in chair;with call bell/phone within reach;with family/visitor present   Nurse Communication Mobility status        Time: 1610-9604 OT Time Calculation (min): 21 min  Charges: OT General Charges $OT Visit: 1 Visit OT Treatments $Therapeutic Activity: 8-22 mins  Cory Russell, OTA Acute Rehabilitation Services  Office [phone]   Cory Russell 11/21/2023, 2:56 PM

## 2023-11-21 NOTE — Progress Notes (Addendum)
 I have seen and examined the patient. I have personally reviewed the clinical findings, laboratory findings, microbiological data and imaging studies. The assessment and treatment plan was discussed with the Nurse Practitioner. I agree with her/his recommendations except following additions/corrections.  Afebrile On 10 L HFNC but he needed to be bumped higher at the time of our exam due to episode of spells, tachycardic. Did not want to answer much questions  Pulmonary following and considered high risk for BAL.   Rpr down to 1:32, no need for treatment   Continue primaquine and clindamycin ( switched from Bactrim  due to ongoing hyperkalemia, one of the specimen was hemolyzed) Continue ART, azithromycin  weekly  Continue course of prednisone  for severe PJP PNA Repeat CMV RNA, fu G6PD Monitor for oxygen needs, clinical improvement Standard isolation precautions   I spent 35 minutes involved in face-to-face and non-face-to-face activities for this patient on the day of the visit. Professional time spent includes the following activities: Preparing to see the patient (review of tests), Obtaining and reviewing separately obtained history (admission/discharge record), Performing a medically appropriate examination and evaluation , Ordering medications/tests, Documenting clinical information in the EMR, Independently interpreting results (not separately reported), communicating with other health care providers, Care coordination (not separately reported).     [facility]  Date of Admission:  11/08/2023      Total days of antibiotics             ASSESSMENT: Cory Russell is a 33 y.o. male admitted with:   Advanced HIV disease, AIDS  VL at 306K, CD4 < 35 on admitt on 6/4, Has been off of ART for quite sometime. Continue Biktarvy  + Prezcobix  taken together with food.  Azithromycin  1200 mg weekly starting tomorrow 6/14.  VL trended down 306,000 >> 640 Some  concern for IRIS features here given rapid viral load reduction within 2 weeks of resuming ARVs.  - continue biktarvy  + prezcobix  taken together daily with food.  **please separate from ARVs by 2 hours before or 4 hours after    Severe PCP (presumed) pneumonia - Elevated Fungitell > 500 -  Hypoxic Respiratory Failure -  Pleuritic Chest Pain, Ongoing. Remains on 10 LPM HFNC but reserve drops requiring intermittent higher doses of O2.  He is more tachycardic today, no fevers. CXR reviewed personally - diffuse hazy dense airspace disease w/o any new complicating feature. No PTX. Chest CT w/o cavitary lesions.  Sputum culture 6/13 normal flora RVP negative PCCM evaluated today as well - hold on bronch for now due to hypoxia risk - Continue to titrate o2  - Continue chest PT if he feels this is helpful.  - continue clinda + primaquin for now - will need to try to re challenge bactrim ; suspect there was a degree of hemolysis in lab re: hyperkalemia.   Fevers, Last 6/14 -  Ongoing intermittent high fevers, last on 6/14. Concern for early IRIS possibly. VL already reduced from 306,000 to 640 copies. Will hold the prednisone  at 40 mg daily and customize taper when he is clinically improved. No CNS symptoms. Only localizing features to chest (cough / pain).  CXR diffuse dense airspace disease w/o worsening hypoxia the last few days.  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 Plan to trend tomorrow AM.  At this point we don't feel this is contributing to clinical disease.    Prophylaxis with Severe Immunosuppression -  Azithromycin   1200 mg weekly  G6pd pending   PLAN: Hold prednisone  at 40 mg with custom taper  Continue clindamycin + primaquin for now FU g6pd  Continue biktarvy  + prezcobix  --> please give either 2 hours before or 6 hours after both Ensure and Tums   Principal Problem:   Severe sepsis due to PJP pneumonia Active Problems:   Depression    History of syphilis   Seizure disorder (HCC)   Protein-calorie malnutrition, severe   AIDS (acquired immune deficiency syndrome) (HCC)   Condyloma acuminata   Acute hypoxemic respiratory failure (HCC)   Hyponatremia   Hyperkalemia   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)    azithromycin   1,200 mg Oral Weekly   bictegravir-emtricitabine -tenofovir  AF  1 tablet Oral Daily   cyclobenzaprine  5 mg Oral TID   darunavir -cobicistat   1 tablet Oral Q breakfast   feeding supplement  237 mL Oral TID BM   hydrocortisone    Rectal QID   lidocaine   1 patch Transdermal Q24H   mirtazapine   7.5 mg Oral QHS   nystatin   5 mL Oral QID   predniSONE   40 mg Oral Q breakfast   primaquine  30 mg Oral Daily   simethicone   80 mg Oral Once   sodium chloride   1 g Oral BID WC   valproic  acid  500 mg Oral BID    SUBJECTIVE: He does not want to answer much of our questions today. Feeling some improvement but tired    Review of Systems: Review of Systems  Constitutional:  Positive for chills, fever and malaise/fatigue.  Respiratory:  Positive for cough and shortness of breath. Negative for sputum production.   Gastrointestinal:  Positive for nausea. Negative for vomiting.  Neurological:  Negative for dizziness and headaches.    No Known Allergies  OBJECTIVE: Vitals:   11/20/23 1229 11/20/23 1651 11/21/23 0700 11/21/23 0806  BP: (!) 125/90 124/88 117/76 137/89  Pulse: (!) 105 (!) 124  (!) 122  Resp: 18 17 18 20   Temp: 99.1 F (37.3 C) 99.2 F (37.3 C) 97.8 F (36.6 C) 97.7 F (36.5 C)  TempSrc: Oral Oral Oral Axillary  SpO2: 94% 92%  96%  Weight:      Height:       Body mass index is 17.54 kg/m.  Physical Exam Constitutional:      Appearance: He is ill-appearing.  Pulmonary:     Effort: Pulmonary effort is normal. No respiratory distress.     Comments: On 10 LPM HFNC, diminished diffuse breath sounds  Skin:    General: Skin is warm and dry.   Neurological:      Mental Status: He is alert and oriented to person, place, and time.     Lab Results Lab Results  Component Value Date   WBC 9.1 11/21/2023   HGB 10.9 (L) 11/21/2023   HCT 31.5 (L) 11/21/2023   MCV 83.1 11/21/2023   PLT 345 11/21/2023    Lab Results  Component Value Date   CREATININE 1.05 11/21/2023   BUN 23 (H) 11/21/2023   NA 127 (L) 11/21/2023   K 4.8 11/21/2023   CL 90 (L) 11/21/2023   CO2 29 11/21/2023    Lab Results  Component Value Date   ALT 15 11/18/2023   AST 25 11/18/2023   ALKPHOS 57 11/18/2023   BILITOT 0.4 11/18/2023     Microbiology: Recent Results (from the past 240 hours)  Expectorated Sputum Assessment w Gram Stain, Rflx to Resp Cult  Status: None   Collection Time: 11/17/23  2:20 PM   Specimen: Expectorated Sputum  Result Value Ref Range Status   Specimen Description EXPECTORATED SPUTUM  Final   Special Requests Immunocompromised  Final   Sputum evaluation   Final    THIS SPECIMEN IS ACCEPTABLE FOR SPUTUM CULTURE Performed at [facility], [address], [place], [place] [address]    Report Status 11/17/2023 FINAL  Final  Culture, Respiratory w Gram Stain     Status: None   Collection Time: 11/17/23  2:20 PM  Result Value Ref Range Status   Specimen Description EXPECTORATED SPUTUM  Final   Special Requests Immunocompromised Reflexed from [id number]  Final   Gram Stain NO WBC SEEN FEW GRAM POSITIVE COCCI IN PAIRS   Final   Culture   Final    MODERATE Normal respiratory flora-no Staph aureus or Pseudomonas seen Performed at [facility], [name] [place], [place] [address]    Report Status 11/19/2023 FINAL  Final   I spent 20 minutes involved in face-to-face and non-face-to-face activities for this patient on the day of the visit. Professional time spent includes the following activities: Preparing to see the patient (review of tests), Obtaining and reviewing separately obtained history (admission/discharge record),  Performing a medically appropriate examination and evaluation , Ordering medications/tests, Documenting clinical information in the EMR, Independently interpreting results (not separately reported), communicating with other health care providers, Care coordination (not separately reported).   Gibson Kurtz, MSN, NP-C [facility] for Infectious Disease [facility]  [email or web address] Pager: [phone] Office: [phone] [facility] Main Line: [phone] *Secure Chat Communication Welcome   Total encounter spent 12 min

## 2023-11-21 NOTE — Progress Notes (Signed)
 Progress Note   Patient: Cory Russell UJW:[id number] DOB: June 09, 1990 DOA: 11/08/2023     13 DOS: the patient was seen and examined on 11/21/2023 at 9:40AM      Brief hospital course: 33 y.o. M with HIV/AIDS, epilepsy, nonadherent to medications who presented with subacute cough and dyspnea, finally had seizure and presented to the hospital.    Found to have pneumonia with high O2 needs, bilateral infiltrates, undetectable CD4 count.  Admitted on treatment for PJP.       Assessment and Plan: * Severe sepsis due to PJP pneumonia Admitted with subacute onset of symptoms shortness of breath and cough in setting of uncontrolled HIV.  Found to have CD4 count undetectable, bilateral infiltrates and chest imaging.  RVP negative.  Fungitell abnormal, strong suspicion PJP.  Unable to produce sputum early in hospital stay.   Started on Bactrim  and prednisone  but has had a very slow clinical improvement, still requiring greater than 10 L oxygen.  Pulmonology were consulted, but bronchoscopy felt too high risk.  At this time given his elevated Fungitell, normal sputum flora, we feel confident that this is PJP complicated by IRIS (given rapid improvement in VL since admission).  CTA chest showed bilateral infiltrates, ruled out PE.    On 6/16, potassium elevated, so Bactrim  held and transition to clindamycin/primaquine. -Continue clindamycin, primaquine, prednisone  - Continue Biktarvy , Prezcobix  - Consult ID and pulmonology - Continue azithromycin  - Continue chest PT, Tussionex   Hyperkalemia K up somewhat on Bactrim  until  6/16 when it was up to 6.2.  Stopped Bactrim  and given Lokelma . K back to 4s today - Hold Bactrim , ID may try to rechallenge later - Follow G6PD - Trend BMP  Hyponatremia Na improved.  Given seizures, will correct with NaCl tabs - Hold fluoxetine  - Continue salt tabs - Strict I/Os - Continue mirtazapine  for now  Acute hypoxemic respiratory failure (HCC) Due to PJP  pneumonia - Wean oxygen as able  Condyloma acuminata Per notes from Dr. Aldona Russell: patient complaining of soiling himself often following his surgery with Dr Cory Russell last year. 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 I reached out to Dr Cory Russell and she will arrange for outpatient follow up   Protein-calorie malnutrition, severe - Continue mirtazapine  - Nutritional supplements  Seizure disorder [facility]) Seizure Had seizures prior to admission due to nonadherence to AEDs.  MRI brain normal.  No concern for intracranial infection since admission.  No further seizures since resuming Depakote . - Correct Na - Continue valproic  acid  History of syphilis - Defer to ID  Depression - PRN Xanax          Subjective: Patient still feels short of breath and tired, but he is producing mucus when he coughs, feels like he is gradually getting better.     Physical Exam: BP 106/87 (BP Location: Right Arm)   Pulse (!) 116   Temp 99.6 F (37.6 C) (Oral)   Resp 20   Ht 5' 7 (1.702 m)   Wt 50.8 kg   SpO2 97%   BMI 17.54 kg/m   Thin adult male, lying in bed, interactive and appropriate Heart tachycardic, regular, no murmurs, no peripheral edema Respiratory rate elevated, seems short of breath still on high flow nasal cannula, lungs diminished but no rales or wheezes Abdomen soft, no tenderness palpation, no guarding, no ascites or distention   Data Reviewed: Bmp shows na 127, potassium down to 4.8, creatinine 1.05 CBC  shows mild anemia    Family Communication: Mother at the bedside    Disposition: Status is: Inpatient Patient with untreated HIV/AIDS, presents with severe PJP pneumonia  ID and pulmonology consulted, suspect his clinical course at this time is as expected for severe PJP with IRIS, will continue current cares, expect prolonged recovery        Author: Ephriam Hashimoto, MD 11/21/2023  7:30 PM  For on call review [email or web address].

## 2023-11-22 ENCOUNTER — Inpatient Hospital Stay (HOSPITAL_COMMUNITY)

## 2023-11-22 DIAGNOSIS — R509 Fever, unspecified: Secondary | ICD-10-CM | POA: Diagnosis not present

## 2023-11-22 DIAGNOSIS — B59 Pneumocystosis: Secondary | ICD-10-CM | POA: Diagnosis not present

## 2023-11-22 DIAGNOSIS — A419 Sepsis, unspecified organism: Secondary | ICD-10-CM | POA: Diagnosis not present

## 2023-11-22 DIAGNOSIS — B20 Human immunodeficiency virus [HIV] disease: Secondary | ICD-10-CM | POA: Diagnosis not present

## 2023-11-22 DIAGNOSIS — E875 Hyperkalemia: Secondary | ICD-10-CM

## 2023-11-22 DIAGNOSIS — J9601 Acute respiratory failure with hypoxia: Secondary | ICD-10-CM | POA: Diagnosis not present

## 2023-11-22 DIAGNOSIS — R6520 Severe sepsis without septic shock: Secondary | ICD-10-CM | POA: Diagnosis not present

## 2023-11-22 LAB — GLUCOSE 6 PHOSPHATE DEHYDROGENASE
G6PDH: 8.4 U/g{Hb} (ref 3.8–14.2)
Hemoglobin: 11.2 g/dL — ABNORMAL LOW (ref 13.0–17.7)

## 2023-11-22 LAB — CBC
HCT: 30.6 % — ABNORMAL LOW (ref 39.0–52.0)
Hemoglobin: 10.5 g/dL — ABNORMAL LOW (ref 13.0–17.0)
MCH: 28.9 pg (ref 26.0–34.0)
MCHC: 34.3 g/dL (ref 30.0–36.0)
MCV: 84.3 fL (ref 80.0–100.0)
Platelets: 337 10*3/uL (ref 150–400)
RBC: 3.63 MIL/uL — ABNORMAL LOW (ref 4.22–5.81)
RDW: 14.2 % (ref 11.5–15.5)
WBC: 9.6 10*3/uL (ref 4.0–10.5)
nRBC: 0 % (ref 0.0–0.2)

## 2023-11-22 LAB — BASIC METABOLIC PANEL WITH GFR
Anion gap: 6 (ref 5–15)
BUN: 24 mg/dL — ABNORMAL HIGH (ref 6–20)
CO2: 31 mmol/L (ref 22–32)
Calcium: 8.8 mg/dL — ABNORMAL LOW (ref 8.9–10.3)
Chloride: 91 mmol/L — ABNORMAL LOW (ref 98–111)
Creatinine, Ser: 1.19 mg/dL (ref 0.61–1.24)
GFR, Estimated: >60 mL/min (ref >60)
Glucose, Bld: 93 mg/dL (ref 70–99)
Potassium: 4.5 mmol/L (ref 3.5–5.1)
Sodium: 128 mmol/L — ABNORMAL LOW (ref 135–145)

## 2023-11-22 MED ORDER — SULFAMETHOXAZOLE-TRIMETHOPRIM 400-80 MG/5ML IV SOLN
15.0000 mg/kg/d | Freq: Three times a day (TID) | INTRAVENOUS | Status: DC
  Administered 2023-11-22 – 2023-11-24 (×4): 256 mg via INTRAVENOUS
  Filled 2023-11-22 (×8): qty 16

## 2023-11-22 MED ORDER — ALUM & MAG HYDROXIDE-SIMETH 200-200-20 MG/5ML PO SUSP
15.0000 mL | Freq: Three times a day (TID) | ORAL | Status: DC | PRN
  Administered 2023-11-22 – 2023-11-24 (×2): 15 mL via ORAL
  Filled 2023-11-22 (×2): qty 30

## 2023-11-22 MED ORDER — SIMETHICONE 40 MG/0.6ML PO SUSP
40.0000 mg | Freq: Four times a day (QID) | ORAL | Status: DC | PRN
  Administered 2023-11-22 – 2023-11-25 (×4): 40 mg via ORAL
  Filled 2023-11-22 (×9): qty 0.6

## 2023-11-22 MED ORDER — METHYLPREDNISOLONE SODIUM SUCC 125 MG IJ SOLR
125.0000 mg | Freq: Two times a day (BID) | INTRAMUSCULAR | Status: DC
  Administered 2023-11-22 – 2023-11-28 (×10): 125 mg via INTRAVENOUS
  Filled 2023-11-22 (×12): qty 2

## 2023-11-22 MED ORDER — ADULT MULTIVITAMIN W/MINERALS CH
1.0000 | ORAL_TABLET | Freq: Every day | ORAL | Status: DC
  Administered 2023-11-22 – 2023-11-26 (×5): 1 via ORAL
  Filled 2023-11-22 (×5): qty 1

## 2023-11-22 NOTE — Plan of Care (Signed)

## 2023-11-22 NOTE — Progress Notes (Signed)
 Pharmacy: Antimicrobial Stewardship Note  71 YOM with HIV - noncompliant with ART regimen - CD4 <35 on 6/4 currently being treated for presumed PJP PNA given low CD4 count and fungitell >500.   The patient was started on PJP treatment on 6/4 and is currently on D#15/21 of treatment.   The patient was transitioned to clindamycin + primaquine on 6/16 in the setting of hyperkalemia of 6.1 (partial hemolysis noted) - now resolved with lokelma  x 1. Renal function is stable - will re-trial SMX/TMP at this time.   Plan - D/c clindamycin and primaquine - Restart sulfamethoxazole /trimethoprim  256 mg TMP IV every 8 hours (~15 mg/kg/day TMP) - Will monitor K and SCr closely  Thank you for allowing pharmacy to be a part of this patient's care.  Garland Junk, PharmD, BCPS, BCIDP Infectious Diseases Clinical Pharmacist 11/22/2023 9:30 AM   **Pharmacist phone directory can now be found on [email or web address] ([email or web address]).  Listed under [facility].

## 2023-11-22 NOTE — Progress Notes (Signed)
 RT arrived to do CPT. Patient extremely short of breath and can't finish sentences. Sat probe reading in the 60's. Oxygen now at 15L. Breathing treatment given. Patient seems to be better with sats of 91%. RN made aware.

## 2023-11-22 NOTE — Progress Notes (Signed)
   Name: Cory Russell MRN: [id number] DOB: 1990-10-11    ADMISSION DATE:  11/08/2023 CONSULTATION DATE: 11/21/2023  REFERRING MD : [facility]  CHIEF COMPLAINT:     HISTORY OF PRESENT ILLNESS:   Cory Russell is a 33 y.o. man with past medical history of HIV/AIDS, CD4<35 on admission, not on ARVs. Presents with seizure like activity 11 days ago and found to have sepsis from pneumonia.   He notes symptoms started about 2-3 weeks ago with shortness of breath and dry cough. He had two separate rounds of antibiotics visiting other urgent care and ED visits. He has tried albuterol  nebulizer treatments from friends and initially found them helpful.   CT Chest shows bilateral ground glass opacities concerning for pneumoncystis pneumonia. Since being here he has been Started on empiric antibiotics with bactrim  and steroids. Also resumed ARVs during this admission. Has been persistently hypoxemic on 10LNC. Pulmonary consulted to assist with evaluation and management and for bronchoscopy.    He feels persistently short of breath, worse with exertion. He feels like chest PT is helping him. Feeling more anxious. Also having difficulty taking pills.   PAST MEDICAL HISTORY :   has a past medical history of Anal pain, Cocaine abuse (HCC), Depression, GERD (gastroesophageal reflux disease), HIV (human immunodeficiency virus infection) (HCC), and Hypertension.  has a past surgical history that includes Mass excision (N/A, 06/30/2021) and Laser ablation condolamata (N/A, 12/23/2021).  SUBJECTIVE:  Reports feeling better VITAL SIGNS: Temp:  [99 F (37.2 C)-99.6 F (37.6 C)] 99.1 F (37.3 C) (06/18 0739) Pulse Rate:  [113-127] 123 (06/18 0739) Resp:  [18-20] 18 (06/18 0739) BP: (106-128)/(68-87) 126/76 (06/18 0739) SpO2:  [88 %-98 %] 88 % (06/18 0835) Weight:  [51.2 kg] 51.2 kg (06/18 0600)  PHYSICAL EXAMINATION: General: Awake alert no acute distress at rest is 4:00 removal of his AV graft  today Neuro: Grossly intact without focal defect HEENT: JVD or lymphadenopathy is appreciated Cardiovascular: Sounds are regular Lungs: Diminished in the bases Abdomen: Soft nontender positive bowel sounds Musculoskeletal: AV graft is unremarkable Skin: Dry  Recent Labs  Lab 11/20/23 1039 11/21/23 0500 11/22/23 0353  NA 124* 127* 128*  K 6.1* 4.8 4.5  CL 90* 90* 91*  CO2 23 29 31   BUN 21* 23* 24*  CREATININE 1.05 1.05 1.19  GLUCOSE 99 103* 93   Recent Labs  Lab 11/18/23 0414 11/20/23 2209 11/21/23 0500 11/22/23 0353  HGB 10.8* 11.2* 10.9* 10.5*  HCT 31.3*  --  31.5* 30.6*  WBC 8.6  --  9.1 9.6  PLT 359  --  345 337   No results found.   Resolved problem list   Assessment and Plan  Acute hypoxemic respiratory failure Bilateral GGOs AIDS CD4<35, resumed biktarvy  this admission Suspected IRIS CMV Viremia Possible IRIS  Wean O2 as able currently on 10 L Continue Bactrim  and prednisone  for infectious disease for empirical treatment of pneumocystis. Continue antiviral treatment No need for bronchoscopy at this time Will continue to follow intermittently He does seem to be improving  Signature:   Siegfried Dress Pinkey Mcjunkin ACNP Acute Care Nurse Practitioner [facility] Pulmonary/Critical Care Please consult Amion 11/22/2023, 11:10 AM

## 2023-11-22 NOTE — Progress Notes (Addendum)
 I have seen and examined the patient. I have personally reviewed the clinical findings, laboratory findings, microbiological data and imaging studies. The assessment and treatment plan was discussed with the Nurse Practitioner. I agree with her/his recommendations except following additions/corrections.  T max 100.4 Briefly required to be on 15L when overexerting then went back to 10L at the time of our exam.  Cough is dry and non productive.  Cxr with worsening findings  Plan   Will switch primaquine and clindamycin to IV bactrim  as preferred treatment as k levels normal and prior specimen was hemolyzed Agree with PCCM switching to IV steroids  Fu repeat CMV PCR, PJP stain from sputum ordered  Low threshold for HAP coverage.  D/w pulmonary   I spent 37 minutes involved in face-to-face and non-face-to-face activities for this patient on the day of the visit. Professional time spent includes the following activities: Preparing to see the patient (review of tests), Obtaining and reviewing separately obtained history (admission/discharge record), Performing a medically appropriate examination and evaluation , Ordering medications/tests, Documenting clinical information in the EMR, Independently interpreting results (not separately reported), communicating with other health care providers, Care coordination (not separately reported).      [facility]  Date of Admission:  11/08/2023      Total days of antibiotics 14   Bactrim           ASSESSMENT: Cory Russell is a 33 y.o. male admitted with:     Severe PCP (presumed) pneumonia - Elevated Fungitell > 500 -  Hypoxic Respiratory Failure -  Chest pains better. Feels more comfortable today and trying to wean down as he can O2 - Remains on 10 LPM HFNC. Has low reserve with ADLs and requires up to 15 lpm sometimes. Still tachycardic 120s at rest.  CXR reviewed personally - dense, diffuse hazy dense airspace  disease w/o any new complicating feature, though worsened appearing infiltrates today. No PTX. Chest CT w/o cavitary lesions.  Sputum culture 6/13 normal flora RVP negative, Fungitell > 500  Have ordered a repeat sputum for formal PJP DFA as this has not been done yet.  PCCM evaluated - hold on bronch for now due to hypoxia risk. Increase steroids to IV solumedrol.  - Continue to titrate o2  - Continue chest PT if he feels this is helpful.  - Switch back to IV bactrim  for first line to rechallenge potassium (may have been more hemolysis)  - Solumedrol IV to help with possible IRIS complicating features  - Tenuous    Advanced HIV disease, AIDS  VL at 306K, CD4 < 35 on admitt on 6/4. Has been off of ART for quite sometime. Continue Biktarvy  + Prezcobix  taken together with food.  Azithromycin  1200 mg weekly starting tomorrow 6/14.  VL trended down 306,000 >> 640 copies Some concern for IRIS features here given rapid viral load reduction within 2 weeks of resuming ARVs.  - continue biktarvy  + prezcobix  taken together daily with food.  **please separate from ARVs by 2 hours before or 4 hours after   Fevers, Last 6/14 -  Ongoing intermittent high fevers, last on 6/14. Concern for early IRIS. VL already reduced from 306,000 to 640 copies after < 2 weeks No CNS symptoms. Only localizing features to chest (cough / pain).  CXR diffuse dense airspace disease w/o worsening hypoxia the last few days.  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 Plan to trend - repeat in process.  At this point we don't feel this is contributing to clinical disease.    Prophylaxis with Severe Immunosuppression -  Azithromycin  1200 mg weekly  G6pd pending  H/O Syphilis -  Tx 03/02/2023 Non-contributory to present hospitalization but RPR reduced from 1:256 to 1:32 indicating adequate treatment   PLAN: Re-challenge with IV bactrim   Agree with increase steroids  and back to IV solumedrol  Serial CXR  Continue biktarvy  + prezcobix  with food daily.  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)   Pneumonia due to Pneumocystis jirovecii (HCC)    azithromycin   1,200 mg Oral Weekly   bictegravir-emtricitabine -tenofovir  AF  1 tablet Oral Daily   cyclobenzaprine  5 mg Oral TID   darunavir -cobicistat   1 tablet Oral Q breakfast   feeding supplement  237 mL Oral TID BM   hydrocortisone    Rectal QID   lidocaine   1 patch Transdermal Q24H   nystatin   5 mL Oral QID   predniSONE   40 mg Oral Q breakfast   simethicone   80 mg Oral Once   sodium chloride   1 g Oral BID WC   valproic  acid  500 mg Oral BID    SUBJECTIVE: Had another episode of increased dyspnea - felt to be more r/t over exertion and performing own ADLs. Mostly dry cough, not terribly productive  No fevers.   Review of Systems: Review of Systems  Constitutional:  Negative for chills and fever.  Respiratory:  Positive for cough and shortness of breath. Negative for hemoptysis and sputum production.   Cardiovascular:  Positive for chest pain.  Gastrointestinal:  Negative for abdominal pain, nausea and vomiting.    No Known Allergies  OBJECTIVE: Vitals:   11/22/23 0600 11/22/23 0739 11/22/23 0835 11/22/23 1232  BP:  126/76  119/75  Pulse: (!) 113 (!) 123  (!) 128  Resp:  18  20  Temp:  99.1 F (37.3 C)  (!) 100.4 F (38 C)  TempSrc:  Oral  Oral  SpO2: 98% 98% (!) 88% 91%  Weight: 51.2 kg     Height:       Body mass index is 17.68 kg/m.  Physical Exam Constitutional:      General: He is not in acute distress.     Appearance: Normal appearance. He is ill-appearing.  HENT:     Head: Normocephalic.     Mouth/Throat:     Mouth: Mucous membranes are moist.     Pharynx: Oropharynx is clear.   Eyes:     General: No scleral icterus.   Cardiovascular:     Rate and Rhythm: Regular rhythm. Tachycardia present.  Pulmonary:     Effort: Pulmonary effort is normal. No respiratory distress.     Breath sounds: No wheezing.   Musculoskeletal:        General: Normal range of motion.     Cervical back: Normal range of motion.   Skin:    Coloration: Skin is not jaundiced or pale.   Neurological:     Mental Status: He is alert and oriented to person,  place, and time.   Psychiatric:        Mood and Affect: Mood normal.        Judgment: Judgment normal.     Lab Results Lab Results  Component Value Date   WBC 9.6 11/22/2023   HGB 10.5 (L) 11/22/2023   HCT 30.6 (L) 11/22/2023   MCV 84.3 11/22/2023   PLT 337 11/22/2023    Lab Results  Component Value Date   CREATININE 1.19 11/22/2023   BUN 24 (H) 11/22/2023   NA 128 (L) 11/22/2023   K 4.5 11/22/2023   CL 91 (L) 11/22/2023   CO2 31 11/22/2023    Lab Results  Component Value Date   ALT 15 11/18/2023   AST 25 11/18/2023   ALKPHOS 57 11/18/2023   BILITOT 0.4 11/18/2023     Microbiology: Recent Results (from the past 240 hours)  Expectorated Sputum Assessment w Gram Stain, Rflx to Resp Cult     Status: None   Collection Time: 11/17/23  2:20 PM   Specimen: Expectorated Sputum  Result Value Ref Range Status   Specimen Description EXPECTORATED SPUTUM  Final   Special Requests Immunocompromised  Final   Sputum evaluation   Final    THIS SPECIMEN IS ACCEPTABLE FOR SPUTUM CULTURE Performed at [name], [address] [place], [place] [address]    Report Status 11/17/2023 FINAL  Final  Culture, Respiratory w Gram Stain     Status: None   Collection Time: 11/17/23  2:20 PM  Result Value Ref Range Status   Specimen Description  EXPECTORATED SPUTUM  Final   Special Requests Immunocompromised Reflexed from [id number]  Final   Gram Stain NO WBC SEEN FEW GRAM POSITIVE COCCI IN PAIRS   Final   Culture   Final    MODERATE Normal respiratory flora-no Staph aureus or Pseudomonas seen Performed at [facility], [address] [place], [place] [address]    Report Status 11/19/2023 FINAL  Final   Imaging DG CHEST PORT 1 VIEW Result Date: 11/22/2023 CLINICAL DATA:  Respiratory distress EXAM: PORTABLE CHEST 1 VIEW COMPARISON:  Radiograph 11/17/2023 FINDINGS: Stable cardiac silhouette. Increased bilateral patchy and dense airspace disease. Obscuration of the LEFT heart border. No pneumothorax. No acute osseous abnormality. IMPRESSION: Worsening bilateral severe airspace disease. Electronically Signed   By: Deboraha Fallow M.D.   On: 11/22/2023 13:27     Gibson Kurtz, MSN, NP-C [facility]  [email or web address] Pager: [phone] Office: [phone] [facility] Main Line: [phone] *Secure Chat Communication Welcome  Total Encounter Time: 14 min

## 2023-11-22 NOTE — Progress Notes (Signed)
 Nutrition Follow-up  DOCUMENTATION CODES:  Severe malnutrition in context of chronic illness, Underweight  INTERVENTION:  Ensure Plus High Protein po TID, each supplement provides 350 kcal and 20 grams of protein. Magic cup TID with meals, each supplement provides 290 kcal and 9 grams of protein Encourage PO intake Multivitamin w/ minerals daily  NUTRITION DIAGNOSIS:  Severe Malnutrition related to chronic illness (HIV) as evidenced by severe muscle depletion, severe fat depletion. 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   6/04 - Admitted  RD working remotely at time of follow-up. Pt with increase respiratory needs today, increased to 15 L/min via HFNC. RN reports that pt has been eating better over the past few days and continues to have family/friend bring outside food in. Per meds history, pt accepting almost all Ensure's; unsure completion. RN reports that he continues to drink; finishing at least one per day.   Meal Intake 6/04: 0% lunch, 10% dinner 6/05: 50% lunch 6/06: 100% breakfast 6/07: 100% dinner 6/11: 0% breakfast 6/14: 100% x lunch 6/15: 100% lunch, 0% dinner  Admission Weight: 49 kg Current Weight: 51.2 kg (6/18)  Nutrition Related Medications: Zithromax  (weekly), Solu-Medrol, Sodium Chloride  tablet BID, IV antibiotics  Labs: Sodium 128, Potassium 4.5, BUN 24, Creatinine 1.19  UOP: 900 mL x 24 hrs  Diet Order:   Diet Order             Diet regular Room service appropriate? Yes; Fluid consistency: Thin  Diet effective now                    EDUCATION NEEDS: No education needs have been identified at this time  Skin:  Skin Assessment: Reviewed RN Assessment  Last BM:  6/18 - type 4 (medium)  Height:  Ht Readings from Last 1 Encounters:  11/08/23 5' 7 (1.702 m)   Weight:  Wt Readings from Last 1 Encounters:  11/22/23 51.2 kg   BMI:  Body mass index is 17.68 kg/m.  Estimated Nutritional Needs:  Kcal:  1800-2200 Protein:  80-100 grams Fluid:  >1.8 L/day   Doneta Furbish RD, LDN Clinical Dietitian

## 2023-11-22 NOTE — Progress Notes (Signed)
 Progress Note   Patient: Cory Russell ZOX:[id number] DOB: 1990/11/12 DOA: 11/08/2023     14 DOS: the patient was seen and examined on 11/22/2023 at 9:40AM      Brief hospital course: 33 y.o. M with HIV/AIDS, epilepsy, nonadherent to medications who presented with subacute cough and dyspnea, finally had seizure and presented to the hospital.    Found to have pneumonia with high O2 needs, bilateral infiltrates, undetectable CD4 count.  Admitted on treatment for PJP.   =========================================================================  Subjective:  The patient was seen and examined this morning, in acute respiratory distress, on high flow oxygen via nasal cannula 15 L, satting 91% Satting as low as 88% earlier this morning-s/p per respiratory therapist already Stating he is not feeling well, due to shortness of breath and cough.    ------------------------------------------------------------------------------------------------------------------------------------  Assessment and Plan:  * Severe sepsis due to PJP pneumonia Remain in acute respiratory failure, requiring 10 to 15 L of oxygen, this morning was satting 88% now improved to 91% Will continue nebs - Will obtain a chest x-ray - ID following, modifying antibiotics accordingly currently on Bactrim  and azithromycin   POA: Admitted with subacute onset of symptoms shortness of breath and cough in setting of uncontrolled HIV.  Found to have CD4 count undetectable, bilateral infiltrates and chest imaging.  RVP negative.  Fungitell abnormal, strong suspicion PJP.   Unable to produce sputum early in hospital stay.   Started on Bactrim  and prednisone  but has had a very slow clinical improvement, still requiring greater than 10-15 L oxygen.   Pulmonology were consulted, but bronchoscopy felt too high risk.  At this time given his elevated Fungitell, normal sputum flora, we feel confident that this is PJP complicated by IRIS (given  rapid improvement in VL since admission).   CTA chest showed bilateral infiltrates, ruled out PE.    On 6/16, potassium elevated, so Bactrim  held and transition to clindamycin/primaquine. - was on  clindamycin, primaquine >>> now back on azithromycin  and Bactrim  + prednisone  - Continue Biktarvy , Prezcobix  - Consult ID and pulmonology - Continue chest PT, Tussionex   Hyperkalemia K up somewhat on Bactrim  until  6/16 when it was up to 6.2.  Stopped Bactrim  and given Lokelma . K back to 4s today - Hold Bactrim , ID may try to rechallenge later - Follow G6PD - Trend BMP  Hyponatremia Serum sodium 127, 128 Na improved.  Given seizures, will continue to correct with with NaCl tabs - Hold fluoxetine  - Continue salt tabs - Strict I/Os - Continue mirtazapine  for now  Acute hypoxemic respiratory failure (HCC) -Requiring up to 15 L HFNC satting 91% Due to PJP pneumonia - Wean oxygen as able  Condyloma acuminata Per notes from Dr. [name]: patient complaining of soiling himself often following his surgery with Dr Andy Bannister last year. 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 I reached out to Dr Andy Bannister and she will arrange for outpatient follow up   Protein-calorie malnutrition, severe - Continue mirtazapine  - Nutritional supplements  Seizure disorder (HCC) Seizure -stable Had seizures prior to admission due to nonadherence to AEDs.  MRI brain normal.  No concern for intracranial infection since admission.  No further seizures since resuming Depakote . - Correct Na - Continue valproic  acid  History of syphilis - Defer to ID  Depression - PRN Xanax             Physical Exam: BP 119/75 (BP Location: Left Arm)   Pulse Aaron Aas)  128   Temp (!) 100.4 F (38 C) (Oral)   Resp 20   Ht 5' 7 (1.702 m)   Wt 51.2 kg   SpO2 91%   BMI 17.68 kg/m    General:  Cachectic male, with shortness of breath, hypoxia  AAO x  3,  cooperative, in distress due to shortness of breath  HEENT:  Normocephalic, PERRL, otherwise with in Normal limits   Neuro:  CNII-XII intact. , normal motor and sensation, reflexes intact   Lungs:   Clear to auscultation BL, Respirations unlabored,  No wheezes / crackles  Cardio:    S1/S2, RRR, No murmure, No Rubs or Gallops   Abdomen:  Soft, non-tender, bowel sounds active all four quadrants, no guarding or peritoneal signs.  Muscular  skeletal:  Limited exam -global generalized weaknesses - in bed, able to move all 4 extremities,   2+ pulses,  symmetric, No pitting edema  Skin:  Dry, warm to touch, negative for any Rashes,  Wounds: Please see nursing documentation          Data Reviewed: Bmp shows na 127, potassium down to 4.8, creatinine 1.05 CBC shows mild anemia    Family Communication: Mother at the bedside    Disposition:-Home in 1 to 2 days once O2 demand improves currently still requiring 10 L of oxygen  Status is: Inpatient Patient with untreated HIV/AIDS, presents with severe PJP pneumonia  ID and pulmonology consulted, suspect his clinical course at this time is as expected for severe PJP with IRIS, will continue current cares, expect prolonged recovery  DVT prophylaxis SCDs     Author: Bobbetta Burnet, MD 11/22/2023 1:18 PM  For on call review [email or web address].

## 2023-11-23 DIAGNOSIS — F419 Anxiety disorder, unspecified: Secondary | ICD-10-CM

## 2023-11-23 DIAGNOSIS — R638 Other symptoms and signs concerning food and fluid intake: Secondary | ICD-10-CM

## 2023-11-23 DIAGNOSIS — R6520 Severe sepsis without septic shock: Secondary | ICD-10-CM | POA: Diagnosis not present

## 2023-11-23 DIAGNOSIS — A419 Sepsis, unspecified organism: Secondary | ICD-10-CM | POA: Diagnosis not present

## 2023-11-23 DIAGNOSIS — J159 Unspecified bacterial pneumonia: Secondary | ICD-10-CM

## 2023-11-23 DIAGNOSIS — B20 Human immunodeficiency virus [HIV] disease: Secondary | ICD-10-CM | POA: Diagnosis not present

## 2023-11-23 DIAGNOSIS — J9601 Acute respiratory failure with hypoxia: Secondary | ICD-10-CM | POA: Diagnosis not present

## 2023-11-23 DIAGNOSIS — R509 Fever, unspecified: Secondary | ICD-10-CM | POA: Diagnosis not present

## 2023-11-23 DIAGNOSIS — R0902 Hypoxemia: Secondary | ICD-10-CM

## 2023-11-23 DIAGNOSIS — B59 Pneumocystosis: Secondary | ICD-10-CM | POA: Diagnosis not present

## 2023-11-23 LAB — BLOOD GAS, ARTERIAL
Acid-Base Excess: 6.6 mmol/L — ABNORMAL HIGH (ref 0.0–2.0)
Bicarbonate: 31.9 mmol/L — ABNORMAL HIGH (ref 20.0–28.0)
O2 Saturation: 93.6 %
Patient temperature: 37
pCO2 arterial: 47 mmHg (ref 32–48)
pH, Arterial: 7.44 (ref 7.35–7.45)
pO2, Arterial: 66 mmHg — ABNORMAL LOW (ref 83–108)

## 2023-11-23 LAB — BASIC METABOLIC PANEL WITH GFR
Anion gap: 9 (ref 5–15)
BUN: 18 mg/dL (ref 6–20)
CO2: 31 mmol/L (ref 22–32)
Calcium: 9.3 mg/dL (ref 8.9–10.3)
Chloride: 88 mmol/L — ABNORMAL LOW (ref 98–111)
Creatinine, Ser: 1 mg/dL (ref 0.61–1.24)
GFR, Estimated: >60 mL/min (ref >60)
Glucose, Bld: 134 mg/dL — ABNORMAL HIGH (ref 70–99)
Potassium: 5.3 mmol/L — ABNORMAL HIGH (ref 3.5–5.1)
Sodium: 128 mmol/L — ABNORMAL LOW (ref 135–145)

## 2023-11-23 MED ORDER — LEVALBUTEROL HCL 0.63 MG/3ML IN NEBU
INHALATION_SOLUTION | RESPIRATORY_TRACT | Status: AC
  Administered 2023-11-23: 0.63 mg
  Filled 2023-11-23: qty 3

## 2023-11-23 MED ORDER — SODIUM CHLORIDE 1 G PO TABS
2.0000 g | ORAL_TABLET | Freq: Two times a day (BID) | ORAL | Status: DC
  Administered 2023-11-24 – 2023-11-26 (×5): 2 g via ORAL
  Filled 2023-11-23 (×7): qty 2

## 2023-11-23 MED ORDER — LEVALBUTEROL HCL 0.63 MG/3ML IN NEBU
INHALATION_SOLUTION | RESPIRATORY_TRACT | Status: AC
  Filled 2023-11-23: qty 3

## 2023-11-23 MED ORDER — SODIUM ZIRCONIUM CYCLOSILICATE 5 G PO PACK
5.0000 g | PACK | Freq: Every day | ORAL | Status: DC
  Filled 2023-11-23 (×2): qty 1

## 2023-11-23 MED ORDER — LEVALBUTEROL HCL 1.25 MG/0.5ML IN NEBU
1.2500 mg | INHALATION_SOLUTION | Freq: Four times a day (QID) | RESPIRATORY_TRACT | Status: DC
  Administered 2023-11-24 – 2023-11-27 (×11): 1.25 mg via RESPIRATORY_TRACT
  Filled 2023-11-23 (×17): qty 0.5

## 2023-11-23 MED ORDER — IPRATROPIUM BROMIDE 0.02 % IN SOLN
0.5000 mg | Freq: Four times a day (QID) | RESPIRATORY_TRACT | Status: DC
  Administered 2023-11-24 – 2023-11-27 (×11): 0.5 mg via RESPIRATORY_TRACT
  Filled 2023-11-23 (×13): qty 2.5

## 2023-11-23 NOTE — Progress Notes (Signed)
 PT Cancellation Note  Patient Details Name: Cory Russell MRN: [id number] DOB: 1991-05-05   Cancelled Treatment:    Reason Eval/Treat Not Completed: Patient not medically ready;Patient declined, no reason specified.   HR and SpO2 not appropriate for mobility.  Pt deferring everything anyway. 11/23/2023  [name], PT Acute Rehabilitation Services [phone]  (office)   [name] Lorin Hauck 11/23/2023, 5:14 PM

## 2023-11-23 NOTE — Progress Notes (Signed)
 Progress Note   Patient: Cory Russell UEA:[id number] DOB: 08-20-1990 DOA: 11/08/2023     15 DOS: the patient was seen and examined on 11/23/2023 at 9:40AM      Brief hospital course: 33 y.o. M with HIV/AIDS, epilepsy, nonadherent to medications who presented with subacute cough and dyspnea, finally had seizure and presented to the hospital.    Found to have pneumonia with high O2 needs, bilateral infiltrates, undetectable CD4 count.  Admitted on treatment for PJP.   =========================================================================  Subjective:  The patient was seen and examined this morning, awake alert oriented, again in respiratory distress, shallow breathing.  Was on 9 L of oxygen, increased to 10 L of HFNC, currently satting 92% Remains tachypneic with respirate of 20, tachycardic heart rate of 20-Tmax last 24 hours 100.4  In acute distress due to shortness of breath, brief hypoxia O2 demand has increased liters of via high flow oxygen nasal cannula  Quick breathing treatment, IV Solu-Medrol, Xanax was given  ------------------------------------------------------------------------------------------------------------------------------------  Assessment and Plan:  * Severe sepsis due to PJP pneumonia -Still meeting sepsis criteria Current vitals Blood pressure 122/83, pulse (!) 119, Tmax 100.4, currently 97.8, 10 L HFNC SpO2 92%. ABG: ABG    Component Value Date/Time   PHART 7.44 11/23/2023 0940   PCO2ART 47 11/23/2023 0940   PO2ART 66 (L) 11/23/2023 0940   HCO3 31.9 (H) 11/23/2023 0940   TCO2 29 12/23/2021 1027   O2SAT 93.6 11/23/2023 0940     Will continue nebs -Chest x-ray from yesterday was reviewed, worsening bilateral airspace disease noted  - ID following, following closely, titrating antibiotics - Bactrim  and azithromycin   POA: Admitted with subacute onset of symptoms shortness of breath and cough in setting of uncontrolled HIV.  Found to have CD4  count undetectable, bilateral infiltrates and chest imaging.  RVP negative.  Fungitell abnormal, strong suspicion PJP.   Unable to produce sputum early in hospital stay.   PCCM following, continue to require up to 10 L of HFNC, satting 92%. Change DuoNeb bronchodilator treatment to Xopenex/Atrovent  At this time given his elevated Fungitell, normal sputum flora, we feel confident that this is PJP complicated by IRIS (given rapid improvement in VL since admission).   CTA chest showed bilateral infiltrates, ruled out PE.    On 6/16, potassium elevated, so Bactrim  held and transition to clindamycin/primaquine. - was on  clindamycin, primaquine >>> now back on azithromycin  and Bactrim  + prednisone  Was changed to 03/23/2024 added IV Solu-Medrol -due to progressive respiratory failure   - Continue Biktarvy , Prezcobix  - Consult ID and pulmonology - Continue chest PT, Tussionex   Hyperkalemia K up to 5.3,  given Lokelma .  -Back on Bactrim  -Monitor closely  Hyponatremia Serum sodium 127, 128 Na improved.  Given seizures, will continue to correct with with NaCl tabs increased to 2 g twice daily - Hold fluoxetine  - Continue salt tabs - Strict I/Os - Continue mirtazapine  for now  Acute hypoxemic respiratory failure (HCC) - Waxing and waning, again requiring 10 L via HFNC, satting 92% Due to PJP pneumonia - Wean oxygen as able-continuing antibiotics, IV steroids  Condyloma acuminata Per notes from Dr. [name]: patient complaining of soiling himself often following his surgery with Dr Andy Bannister last year. 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 We have reached out to Dr Andy Bannister and she will arrange for outpatient follow up   Protein-calorie malnutrition, severe - Continue mirtazapine  - Nutritional supplements  Seizure disorder (HCC)  Seizure -stable Had seizures prior to admission due to nonadherence to AEDs.  MRI brain  normal.  No concern for intracranial infection since admission.  No further seizures since resuming Depakote . - Correct Na - Continue valproic  acid  History of syphilis - Defer to ID  Depression - PRN Xanax          Physical Exam: BP 122/83 (BP Location: Left Arm)   Pulse (!) 119   Temp 97.8 F (36.6 C) (Oral)   Resp 20   Ht 5' 7 (1.702 m)   Wt 51.2 kg   SpO2 92%   BMI 17.68 kg/m     General:  AAO x 3,  cooperative, no acute distress or shortness of breath  HEENT:  Normocephalic, PERRL, otherwise with in Normal limits   Neuro:  CNII-XII intact. , normal motor and sensation, reflexes intact   Lungs:   Shallow breathing with  diffuse rhonchi, No wheezes / crackles  Cardio:    S1/S2, RRR, No murmure, No Rubs or Gallops   Abdomen:  Soft, non-tender, bowel sounds active all four quadrants, no guarding or peritoneal signs.  Muscular  skeletal:  Limited exam -global generalized weaknesses - in bed, able to move all 4 extremities,   2+ pulses,  symmetric, No pitting edema  Skin:  Dry, warm to touch, negative for any Rashes,  Wounds: Please see nursing documentation     Data Reviewed: Bmp shows na 127, potassium down to 4.8, creatinine 1.05 CBC shows mild anemia    Family Communication: Mother at the bedside    Disposition:-Home in 1 to 2 days once O2 demand improves currently still requiring 10 L of oxygen  Status is: Inpatient Patient with untreated HIV/AIDS, presents with severe PJP pneumonia  ID and pulmonology consulted, suspect his clinical course at this time is as expected for severe PJP with IRIS, will continue current cares, expect prolonged recovery  DVT prophylaxis SCDs     SIGNED: Bobbetta Burnet, MD, FHM. FAAFP Critical care time 50 minutes was spent evaluating and stabilizing the patient from respiratory [facility],  Pager (please use [email or web address] to page/text)  Please use Epic Secure Chat for non-urgent communication (7AM-7PM) If  7PM-7AM, please contact night-coverage [email or web address],  11/23/2023, 11:37 AM  For on call review [email or web address].

## 2023-11-23 NOTE — Progress Notes (Signed)
 Patient called out stating he couldn't breathe.  O2 Sats in 70's.  O2 increased from 10 L HFNC to 15 L HFNC.  Dr Eilene Grater on department was notified.  Respiratory therapy called for assistance.  After rest and increased O2 Flow rate sats 92%.

## 2023-11-23 NOTE — Progress Notes (Signed)
 Patient's O2sats low to mid 80s.  Dr Eilene Grater notified, asked RN to speak with PCCM.  I paged Dr Waylan Haggard, MD stated he would come assess patient.  Patient's HFNC flow increased from 10 L to 15 L, .  Spoke to MD in the hall, to make sure patient has flutter valve and incentive spirometry.  Went to room and placed items with in reach of patient and educated patient on importance of using during awake hours.  Respiratory therapy (Kaitlyn Scotton) also notified.

## 2023-11-23 NOTE — Progress Notes (Addendum)
 I have seen and examined the patient. I have personally reviewed the clinical findings, laboratory findings, microbiological data and imaging studies. The assessment and treatment plan was discussed with the Nurse Practitioner. I agree with her/his recommendations except following additions/corrections.  Afebrile in the last 24 hours.  Tachycardic and tachypneic He had another ' spell' episode where oxygen bumped tp 11L for~ 30 mins> 10L Labs remarkable for NA 128, K5.3, k 1.0 Cough is mostly dry but he is going to attempt to get a sputum sample for PCP stain  Started on IV steroid on 6/18 due to concern for possible IRIS  Continue IV Bactrim , will need assistance from hospitalist regarding management of hyperkalemia/hyponatremia while on IV Bactrim , possibly with daily lokelma  Continue home ART, azithromycin  weekly Daily BMP Fu CMV PCR   I spent 50 minutes involved in face-to-face and non-face-to-face activities for this patient on the day of the visit. Professional time spent includes the following activities: Preparing to see the patient (review of tests), Obtaining and reviewing separately obtained history (admission/discharge record), Performing a medically appropriate examination and evaluation , Ordering medications/tests, Documenting clinical information in the EMR, Independently interpreting results (not separately reported), communicating with other health care providers, Care coordination (not separately reported).      [facility]  Date of Admission:  11/08/2023      Total days of antibiotics 16  Bactrim  6/4 >> (off briefly w/ hyperK)          ASSESSMENT: Cory Russell is a 33 y.o. male admitted with:   Advanced HIV disease, AIDS  VL at 306K, CD4 < 35 on admitt on 6/4, Has been off of ART for quite sometime.   Azithromycin  1200 mg weekly starting tomorrow 6/14.  Rapid reduction in HIV VL to 640 copies  - Continue Biktarvy  + Prezcobix  taken  together with food. **avoid Tums taken too close together   Presumed Severe PJP pneumonia - Complicated by Possible IRIS - Hypoxic Respiratory Failure / ARDS -  Pleuritic Chest Pain, Ongoing. Remains on 10 LPM HFNC with adequate oxygen sats. Tachycardic today with high fever x 1. CXR diffuse dense airspace disease w/o worsening hypoxia the last few days. Worse infiltrate disease yesterday 6/18 and still with poor reserve with activity. No PTX. Chest CT 6/10 w/o cavitary lesions, lymphadenopathy or nodular disease  RVP negative.  Sputum 6/13 - normal flora only Continue with chest PT as he can tolerate 3-4x daily.  ABG shows compensated; PaO2 < 70 on 10 LPM O2, A-a gradient very high. Steroids optimized yesterday 6/18 - Recollect sputum sample if he can produce.  - continue bactrim   - continue IV solumedrol  - daily BMP   Hyperkalemia -  Hyponatremia -  S/E from Bactrim  - Start daily Lokelma  to help try to keep him on Bactrim  preferred therapy   Fevers (improved) -  Had intermittent high fevers to 102.2 F after initiation of ART ~ Day 11-- can see IRIS, little early but could be part of the picture. Will hold the prednisone  at 40 mg daily and customize taper when he is clinically improved. No CNS symptoms. No organomegaly on exam or available imaging (limited). Outside of lungs, no organ dysfunction. No hilar or mediastinal adenopathy. Only localizing features to chest (cough / pain).  Low grade fever 100.4 6/18.  - repeat sputum  - ?iris --> on IV solumedrol  - FU CMV PCR    Prophylaxis with Severe Immunosuppression -  will give  azithromycin  1200mg  po weekly starting Saturday  Depression / Anxiety / Poor Appetite -  Will decrease the remeron  dose to 7.5 mg at bedtime for now. HE is having problems with pill burden and PO medications. He requests xanax  - will defer to Dr. Jonel what he feels is most appropriate given his respiratory failure.      PLAN: Continue bactrim   Add  daily Lokelma  to help with hyperkalemia / hyponatremia.  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)   Pneumonia due to Pneumocystis jirovecii (HCC)    azithromycin   1,200 mg Oral Weekly   bictegravir-emtricitabine -tenofovir  AF  1 tablet Oral Daily   cyclobenzaprine   5 mg Oral TID   darunavir -cobicistat   1 tablet Oral Q breakfast   feeding supplement  237 mL Oral TID BM   hydrocortisone    Rectal QID   ipratropium  0.5 mg Nebulization Q6H   levalbuterol   1.25 mg Nebulization Q6H   lidocaine   1 patch Transdermal Q24H   methylPREDNISolone  (SOLU-MEDROL ) injection  125 mg Intravenous Q12H   multivitamin with minerals  1 tablet Oral Daily   simethicone   80 mg Oral Once   sodium chloride   2 g Oral BID WC   valproic  acid  500 mg Oral BID    SUBJECTIVE: Another attack this morning (3rd in a row) requiring increased oxygen.  Still with conversational dyspnea and low threshold to tolerate ADLs.  One lowgrade fever last 24h to 100.4.  Dry cough. Finds chest PT very helpful and wants to do it 3 or 4 times a day   Review of Systems: Review of Systems  Constitutional:  Positive for fever and malaise/fatigue. Negative for chills.  Respiratory:  Positive for cough and shortness of breath. Negative for sputum production.   Gastrointestinal:  Positive for nausea. Negative for vomiting.  Neurological:  Negative for dizziness and headaches.    No Known Allergies  OBJECTIVE: Vitals:   11/23/23  0802 11/23/23 0826 11/23/23 0952 11/23/23 0955  BP:  122/83    Pulse:  (!) 119    Resp:  20    Temp:  97.8 F (36.6 C)    TempSrc:  Oral    SpO2: 91% 91% 92% 92%  Weight:      Height:       Body mass index is 17.68 kg/m.  Physical Exam Constitutional:      Appearance: He is ill-appearing.  Pulmonary:     Effort: Pulmonary effort is normal. No respiratory distress.     Comments: On 10 LPM HFNC, diminished diffuse breath sounds  Skin:    General: Skin is warm and dry.   Neurological:     Mental Status: He is alert and oriented to person, place, and time.     Lab Results Lab Results  Component Value Date   WBC 9.6 11/22/2023   HGB 10.5 (L) 11/22/2023   HCT 30.6 (L) 11/22/2023   MCV 84.3 11/22/2023   PLT 337 11/22/2023    Lab Results  Component Value Date   CREATININE 1.00 11/23/2023   BUN 18 11/23/2023   NA 128 (L) 11/23/2023   K 5.3 (H) 11/23/2023   CL 88 (L) 11/23/2023   CO2 31 11/23/2023    Lab Results  Component Value Date   ALT 15 11/18/2023   AST 25 11/18/2023   ALKPHOS 57 11/18/2023   BILITOT 0.4 11/18/2023     Microbiology: Recent Results (from the past 240 hours)  Expectorated Sputum Assessment w Gram Stain, Rflx to Resp Cult     Status: None   Collection Time: 11/17/23  2:20 PM   Specimen: Expectorated Sputum  Result Value Ref Range Status   Specimen Description EXPECTORATED SPUTUM  Final   Special Requests Immunocompromised  Final   Sputum evaluation   Final    THIS SPECIMEN IS ACCEPTABLE FOR SPUTUM CULTURE Performed at [facility], [address], [place], [place] [address]    Report Status 11/17/2023 FINAL  Final  Culture, Respiratory w Gram Stain     Status: None   Collection Time: 11/17/23  2:20 PM  Result Value Ref Range Status   Specimen Description EXPECTORATED SPUTUM  Final   Special Requests Immunocompromised Reflexed from [id number]  Final   Gram Stain NO WBC SEEN FEW GRAM POSITIVE COCCI IN PAIRS   Final   Culture    Final    MODERATE Normal respiratory flora-no Staph aureus or Pseudomonas seen Performed at [facility], [address], [place], [place] [address]    Report Status 11/19/2023 FINAL  Final   Imaging No results found.   Corean Fireman, MSN, NP-C [facility] Infectious Disease [facility]  [email or web address] Pager: [phone] Office: [phone] [facility] Main Line: [phone] *Secure Chat Communication Welcome  Total Encounter Time: 20 min

## 2023-11-23 NOTE — Progress Notes (Signed)
 RT at bedside to give pt scheduled breathing tx and CPT. Pt was given breathing tx but pt refused CPT (vest) at this time due to multiple visitors at bedside. Pt would to like to visit and talk to them. Will continue at next scheduled time.

## 2023-11-23 NOTE — Plan of Care (Signed)
  Problem: Clinical Measurements: Goal: Respiratory complications will improve Outcome: Progressing Goal: Cardiovascular complication will be avoided Outcome: Progressing   Problem: Activity: Goal: Risk for activity intolerance will decrease Outcome: Progressing   Problem: Nutrition: Goal: Adequate nutrition will be maintained Outcome: Progressing   Problem: Coping: Goal: Level of anxiety will decrease Outcome: Progressing   Problem: Pain Managment: Goal: General experience of comfort will improve and/or be controlled Outcome: Progressing   Problem: Safety: Goal: Ability to remain free from injury will improve Outcome: Progressing

## 2023-11-23 NOTE — Progress Notes (Addendum)
 NAME:  Cory Russell, MRN:  [id number], DOB:  1990-10-27, LOS: 15 ADMISSION DATE:  11/08/2023, CONSULTATION DATE:  11/23/2023 REFERRING MD:  Bobbetta Burnet, MD, CHIEF COMPLAINT:  respiratory failure   History of Present Illness:  Cory Russell is a 33 y.o. man with past medical history of HIV/AIDS, CD4<35 on admission, not on ARVs. Presents with seizure like activity 11 days ago and found to have sepsis from pneumonia.  He notes symptoms started about 2-3 weeks ago with shortness of breath and dry cough. He had two separate rounds of antibiotics visiting other urgent care and ED visits. He has tried albuterol  nebulizer treatments from friends and initially found them helpful.   CT Chest shows bilateral ground glass opacities concerning for pneumoncystis pneumonia. Since being here he has been Started on empiric antibiotics with bactrim  and steroids. Also resumed ARVs during this admission. Has been persistently hypoxemic on 10LNC. Pulmonary consulted to assist with evaluation and management and for bronchoscopy.   He feels persistently short of breath, worse with exertion. He feels like chest PT is helping him. Feeling more anxious. Also having difficulty taking pills.   Pertinent  Medical History  HIV, Seizure disorder  Significant Hospital Events: Including procedures, antibiotic start and stop dates in addition to other pertinent events   6/4-admit 6/15 pulmonary consult 6/18 transient desats with worsening chest x-ray.  Prednisone  changed to IV Solu-Medrol  Interim History / Subjective:   Still on 10 L oxygen.  No new complaints today.  Objective    Blood pressure (!) 141/87, pulse (!) 124, temperature 98.5 F (36.9 C), temperature source Oral, resp. rate (!) 24, height 5' 7 (1.702 m), weight 51.2 kg, SpO2 90%.        Intake/Output Summary (Last 24 hours) at 11/23/2023 1401 Last data filed at 11/23/2023 0700 Gross per 24 hour  Intake 240 ml  Output 1700 ml  Net -1460 ml    Filed Weights   11/19/23 0336 11/20/23 0500 11/22/23 0600  Weight: 55.8 kg 50.8 kg 51.2 kg    Examination: Blood pressure (!) 141/87, pulse (!) 124, temperature 98.5 F (36.9 C), temperature source Oral, resp. rate (!) 24, height 5' 7 (1.702 m), weight 51.2 kg, SpO2 90%. Gen:      No acute distress HEENT:  EOMI, sclera anicteric Neck:     No masses; no thyromegaly Lungs:    Clear to auscultation bilaterally; normal respiratory effort CV:         Regular rate and rhythm; no murmurs Abd:      + bowel sounds; soft, non-tender; no palpable masses, no distension Ext:    No edema; adequate peripheral perfusion Neuro: alert and oriented x 3 Psych: normal mood and affect    Lab/imaging reviewed ABG with pH 7.44, pCO2 44, pO2 66 Sodium 128, potassium 5.3 BUN/creatinine 18/1.00 Chest x-ray 6/18 with worsening bilateral airspace disease  Resolved problem list   Assessment and Plan  Acute hypoxemic respiratory failure Bilateral GGOs AIDS CD4<35, resumed biktarvy  this admission Suspected IRIS CMV Viremia Possible IRIS  Wean down oxygen as tolerated Continue Bactrim , prednisone  per infectious disease for empiric treatment of pneumocystis.  The scan findings and elevated Fungitell are consistent with this. Antiretroviral treatment Will hold off on bronchoscopy as it is risky with high oxygen requirements. Changed to IV Solu-Medrol on 6/18 for worsening chest x-ray, concern for IRIS.  Continue monitoring. Add incentive spirometer Avoid Lasix  due to high Cr and he appears euvolemic  Signature:   Nuvia Hileman  MD [facility] See Amion for pager  If no response to pager , please call [phone] until 7pm After 7:00 pm call Elink  [phone] 11/23/2023, 2:01 PM

## 2023-11-24 ENCOUNTER — Inpatient Hospital Stay (HOSPITAL_COMMUNITY)

## 2023-11-24 DIAGNOSIS — A419 Sepsis, unspecified organism: Secondary | ICD-10-CM | POA: Diagnosis not present

## 2023-11-24 DIAGNOSIS — R509 Fever, unspecified: Secondary | ICD-10-CM | POA: Diagnosis not present

## 2023-11-24 DIAGNOSIS — R6520 Severe sepsis without septic shock: Secondary | ICD-10-CM | POA: Diagnosis not present

## 2023-11-24 DIAGNOSIS — G40909 Epilepsy, unspecified, not intractable, without status epilepticus: Secondary | ICD-10-CM | POA: Diagnosis not present

## 2023-11-24 DIAGNOSIS — B59 Pneumocystosis: Secondary | ICD-10-CM | POA: Diagnosis not present

## 2023-11-24 DIAGNOSIS — B20 Human immunodeficiency virus [HIV] disease: Secondary | ICD-10-CM | POA: Diagnosis not present

## 2023-11-24 DIAGNOSIS — J9601 Acute respiratory failure with hypoxia: Secondary | ICD-10-CM | POA: Diagnosis not present

## 2023-11-24 DIAGNOSIS — D72829 Elevated white blood cell count, unspecified: Secondary | ICD-10-CM

## 2023-11-24 LAB — CBC
HCT: 28.4 % — ABNORMAL LOW (ref 39.0–52.0)
Hemoglobin: 9.8 g/dL — ABNORMAL LOW (ref 13.0–17.0)
MCH: 28.7 pg (ref 26.0–34.0)
MCHC: 34.5 g/dL (ref 30.0–36.0)
MCV: 83.3 fL (ref 80.0–100.0)
Platelets: 324 10*3/uL (ref 150–400)
RBC: 3.41 MIL/uL — ABNORMAL LOW (ref 4.22–5.81)
RDW: 14.7 % (ref 11.5–15.5)
WBC: 20.9 10*3/uL — ABNORMAL HIGH (ref 4.0–10.5)
nRBC: 0.1 % (ref 0.0–0.2)

## 2023-11-24 LAB — BASIC METABOLIC PANEL WITH GFR
Anion gap: 11 (ref 5–15)
BUN: 20 mg/dL (ref 6–20)
CO2: 29 mmol/L (ref 22–32)
Calcium: 9.2 mg/dL (ref 8.9–10.3)
Chloride: 87 mmol/L — ABNORMAL LOW (ref 98–111)
Creatinine, Ser: 1.05 mg/dL (ref 0.61–1.24)
GFR, Estimated: >60 mL/min (ref >60)
Glucose, Bld: 177 mg/dL — ABNORMAL HIGH (ref 70–99)
Potassium: 5.3 mmol/L — ABNORMAL HIGH (ref 3.5–5.1)
Sodium: 127 mmol/L — ABNORMAL LOW (ref 135–145)

## 2023-11-24 LAB — BLOOD GAS, ARTERIAL
Acid-Base Excess: 6.9 mmol/L — ABNORMAL HIGH (ref 0.0–2.0)
Bicarbonate: 31.9 mmol/L — ABNORMAL HIGH (ref 20.0–28.0)
O2 Saturation: 97.4 %
Patient temperature: 37
pCO2 arterial: 47 mmHg (ref 32–48)
pH, Arterial: 7.44 (ref 7.35–7.45)
pO2, Arterial: 71 mmHg — ABNORMAL LOW (ref 83–108)

## 2023-11-24 MED ORDER — SULFAMETHOXAZOLE-TRIMETHOPRIM 400-80 MG/5ML IV SOLN
320.0000 mg | Freq: Three times a day (TID) | INTRAVENOUS | Status: DC
  Administered 2023-11-24 – 2023-11-27 (×9): 320 mg via INTRAVENOUS
  Filled 2023-11-24 (×11): qty 20

## 2023-11-24 MED ORDER — IPRATROPIUM-ALBUTEROL 0.5-2.5 (3) MG/3ML IN SOLN
3.0000 mL | Freq: Four times a day (QID) | RESPIRATORY_TRACT | Status: DC | PRN
  Administered 2023-11-24 – 2023-11-29 (×4): 3 mL via RESPIRATORY_TRACT
  Filled 2023-11-24 (×4): qty 3

## 2023-11-24 MED ORDER — SODIUM ZIRCONIUM CYCLOSILICATE 10 G PO PACK
10.0000 g | PACK | Freq: Every day | ORAL | Status: DC
  Administered 2023-11-24: 10 g via ORAL

## 2023-11-24 MED ORDER — FUROSEMIDE 10 MG/ML IJ SOLN
40.0000 mg | Freq: Once | INTRAMUSCULAR | Status: AC
  Administered 2023-11-24: 40 mg via INTRAVENOUS

## 2023-11-24 MED ORDER — FUROSEMIDE 10 MG/ML IJ SOLN
INTRAMUSCULAR | Status: AC
  Filled 2023-11-24: qty 4

## 2023-11-24 MED ORDER — METOPROLOL TARTRATE 5 MG/5ML IV SOLN: 2.5000 mg | Freq: Four times a day (QID) | INTRAVENOUS | Status: AC | PRN

## 2023-11-24 NOTE — Progress Notes (Signed)
 PCCM interval note  Maintaining sats in the low 90s on heated high flow.  Mildly tachypnea. He is at high risk for further deterioration but he does not want move to ICU until later today as he has family members visiting Very anxious about going to the ICU as he feels that he will not survive ICU admission and wants to avoid it at all costs but still wants to be full code Will have the night team reassess him.  Lindalee Huizinga MD [facility] See Amion for pager  If no response to pager , please call [phone] until 7pm After 7:00 pm call Elink  [phone] 11/24/2023, 3:51 PM

## 2023-11-24 NOTE — Progress Notes (Signed)
 OT Cancellation Note  Patient Details Name: Cory Russell MRN: [id number] DOB: 01-Aug-1990   Cancelled Treatment:    Reason Eval/Treat Not Completed: Fatigue/lethargy limiting ability to participate. Per RN, pt destating and required rapid response. Per RN best to hold therapies for the day. OT will continue to follow acutely.   Cory Russell, OT  Acute Rehabilitation Services Office [phone] Secure chat preferred   Cory Russell 11/24/2023, 9:41 AM

## 2023-11-24 NOTE — Plan of Care (Signed)

## 2023-11-24 NOTE — Progress Notes (Signed)
 Pharmacy: Antimicrobial Stewardship Note  68 YOM with HIV - noncompliant with ART regimen - CD4 <35 on 6/4 currently being treated for presumed PJP PNA given low CD4 count and fungitell >500.   The patient was started on PJP treatment on 6/4 and is currently on D#17 of treatment.   The patient was transitioned to clindamycin + primaquine briefly on 6/16 in the setting of hyperkalemia of 6.1 (partial hemolysis noted) - resolved and transitioned back to SMX/TMP on 6/18.   K trended back up to 5.3 - started on lokelma  5g daily on 6/19 in an attempt to remain on SMX/TMP and manage hyperkalemia. K remains at 5.3 today - discussed with Dr. Mikle Alexanders and will increase to 10g daily.  Will adjust SMX/TMP dose up to 320 mg TMP component every 8 hours which is ~18.5 mg/kg/day of TMP and within the range of 15-20 mg/kg/day of TMP recommended for PJP PNA. Clinical course complicated by likely IRIS with steroids increased - CCM monitoring respiratory status closely. WBC up likely partially related to steroids,.  Plan - Adjust sulfamethoxazole /trimethoprim  to 320 mg TMP IV every 8 hours (~18.5 mg/kg/day TMP) - Increase Lokelma  to 10g daily - admin at noon to avoid binding medications - Will monitor K and SCr closely  Thank you for allowing pharmacy to be a part of this patient's care.  Garland Junk, PharmD, BCPS, BCIDP Infectious Diseases Clinical Pharmacist 11/24/2023 11:30 AM   **Pharmacist phone directory can now be found on [email or web address] ([email or web address]).  Listed under [facility].

## 2023-11-24 NOTE — Progress Notes (Signed)
 Progress Note   Patient: Cory Russell IRJ:[id number] DOB: 1990-11-10 DOA: 11/08/2023     16 DOS: the patient was seen and examined on 11/24/2023 at 9:40AM      Brief hospital course: 33 y.o. M with HIV/AIDS, epilepsy, nonadherent to medications who presented with subacute cough and dyspnea, finally had seizure and presented to the hospital.    Found to have pneumonia with high O2 needs, bilateral infiltrates, undetectable CD4 count.  Admitted on treatment for PJP.  Very slow if any towards recovery =========================================================================  Subjective:  Had panic attack this a.m. with desaturation  ------------------------------------------------------------------------------------------------------------------------------------  Assessment and Plan:  * Severe sepsis due to PJP pneumonia - ID following, following closely, titrating antibiotics - Bactrim  and azithromycin  -Pulm following as well: May need ICU level care - Continue high flow nasal cannula - Nebs - Treat anxiety - Solu-Medrol - Chest PT  Hyperkalemia -Lokelma  -Given a dose of Lasix  on 6/20  Hyponatremia - Hold fluoxetine  - Continue salt tabs -Daily lab  Acute hypoxemic respiratory failure (HCC) - Waxing and waning, again requiring  HFNC Due to PJP pneumonia - Wean oxygen as able-continuing antibiotics, IV steroids -May need close follow-up in the ICU, appreciate Dr. Vinita Greenspan consult  Condyloma acuminata Per notes from Dr. [name]: patient complaining of soiling himself often following his surgery with Dr Andy Bannister last year. 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 Dr Andy Bannister and she will arrange for outpatient follow up    Seizure disorder [facility]) Seizure -stable Had seizures prior to admission due to nonadherence to AEDs.  MRI brain normal.  No concern for intracranial infection since admission.  No further  seizures since resuming Depakote . -Continue meds and seizure precautions   anxiety - PRN Xanax  Advanced HIV disease, AIDS  VL at 306K, CD4 < 35 on admitt on 6/4, Has been off of ART for quite sometime.   --Per ID  Nutrition Status: Nutrition Problem: Severe Malnutrition Etiology: chronic illness (HIV) Signs/Symptoms: severe muscle depletion, severe fat depletion Interventions: Ensure Enlive (each supplement provides 350kcal and 20 grams of protein), MVI, Magic cup      Physical Exam: BP 130/84 (BP Location: Left Arm)   Pulse (!) 123   Temp 98.2 F (36.8 C) (Oral)   Resp (!) 24   Ht 5' 7 (1.702 m)   Wt 51.2 kg   SpO2 92%   BMI 17.68 kg/m     General: Appearance:  Thin, anxious male in mild respiratory distress, tearful at times     Lungs:   On high flow nasal cannula  Heart:    Tachycardic.    MS:   All extremities are intact.   Neurologic:   Awake, alert, tearful regarding respiratory status         Data Reviewed: Na: 127 K: 5.3 WBC: 20.9   Family Communication: Mother at the bedside    Disposition:-Home in 1 to 2 days once O2 demand improves currently still requiring 10 L of oxygen  Status is: Inpatient Patient with untreated HIV/AIDS, presents with severe PJP pneumonia  ID and pulmonology consulted, suspect his clinical course at this time is as expected for severe PJP with IRIS, will continue current cares, expect prolonged recovery  DVT prophylaxis SCDs     SIGNED: Laine Giovanetti U Yaser Harvill, DO  [facility],  Pager (please use [email or web address] to page/text)  Please use Epic Secure Chat for non-urgent communication (7AM-7PM) If 7PM-7AM, please contact night-coverage [email or web address],  11/24/2023,  11:00 AM  For on call review [email or web address].

## 2023-11-24 NOTE — Significant Event (Signed)
 Rapid Response Event Note   Reason for Call :  O2 desat into 70s on 15L [place]  placed on NRB  Initial Focused Assessment:  Patient is alert sitting upright in the bed.  He denies shortness of breath but is breathing rapidly. Lung sounds with scatter rhonchi in bases. Heart tones regular  BP 130/84 HR 125  RR 30  O2 sat 92% on NRB    Interventions:  Placed on Heated High Flow [place] per RT (50L/95%) O2 sats 90-92%  Plan of Care:  RN to call if patient does not tolerate heated HFNC or if he desats with O2 on.     Event Summary:   MD Notified: by bedside RN Call Time: 0818  Arrival Time: 1610 End Time: 9604  Waldemar Guillaume, RN

## 2023-11-24 NOTE — Progress Notes (Signed)
 At 0813, patient complained of acute shortness of breath and panic.  O2 sat dropped to 70% range and NT at bedside increased nasal canula flow to 15L/m with no return of O2 sats; this RN responded to bedside.  Auscultation of lung fields with scattered crackles; similar to previous assessments.  Assisted patient to cough/deep breathe and provided chest physiotherapy; also with no return of O2 saturation.  Patient became moderately distressed and saturations dropped to 40% range.  Initiated non-rebreather mask at 100% O2 with return of saturations to 92% and notified rapid response, RN, Hella and respiratory therapy for further assessment.  Notified Dr. Vann by secure message also.  Rapid response and respiratory therapy responded to bedside for assessment and started heated high flow nasal canula with further improvement of saturations.  Patient and family educated; would like to avoid transfer to ICU for closer management if possible.  Stable and holding O2 saturations on heated high flow nasal canula at this time; will continue to closely monitor.  MD orders pending.   11/24/23 0813  Assess: MEWS Score  Temp 98.2 F (36.8 C)  BP 130/84  MAP (mmHg) 97  Pulse Rate (!) 127  Resp (!) 24  SpO2 (!) 70 %  O2 Device Nasal Cannula  O2 Flow Rate (L/min) 15 L/min  Assess: MEWS Score  MEWS Temp 0  MEWS Systolic 0  MEWS Pulse 2  MEWS RR 1  MEWS LOC 0  MEWS Score 3  MEWS Score Color Yellow  Assess: if the MEWS score is Yellow or Red  Were vital signs accurate and taken at a resting state? Yes  Does the patient meet 2 or more of the SIRS criteria? Yes  Does the patient have a confirmed or suspected source of infection? Yes  MEWS guidelines implemented  Yes, yellow  Treat  MEWS Interventions Considered administering scheduled or prn medications/treatments as ordered  Take Vital Signs  Increase Vital Sign Frequency  Yellow: Q2hr x1, continue Q4hrs until patient remains green for 12hrs  Escalate   MEWS: Escalate Yellow: Discuss with charge nurse and consider notifying provider and/or RRT  Notify: Charge Nurse/RN  Name of Charge Nurse/RN Notified Sal Crass, RN  Provider Notification  Provider Name/Title Mikle Alexanders  Date Provider Notified 11/24/23  Time Provider Notified [phone]  Method of Notification Page  Notification Reason Change in status  Provider response See new orders  Date of Provider Response 11/24/23  Time of Provider Response 0848  Notify: Rapid Response  Name of Rapid Response RN Notified Hella, RN  Date Rapid Response Notified 11/24/23  Time Rapid Response Notified 0825  Assess: SIRS CRITERIA  SIRS Temperature  0  SIRS Respirations  1  SIRS Pulse 1  SIRS WBC 1  SIRS Score Sum  3     11/24/23 0900  Assess: MEWS Score  Pulse Rate (!) 123  ECG Heart Rate (!) 125  SpO2 92 %  O2 Device HHFNC  O2 Flow Rate (L/min) 50 L/min  FiO2 (%) 95 %  Assess: MEWS Score  MEWS Temp 0  MEWS Systolic 0  MEWS Pulse 2  MEWS RR 1  MEWS LOC 0  MEWS Score 3  MEWS Score Color Yellow  Assess: if the MEWS score is Yellow or Red  Were vital signs accurate and taken at a resting state? Yes  Assess: SIRS CRITERIA  SIRS Temperature  0  SIRS Respirations  1  SIRS Pulse 1  SIRS WBC 1  SIRS Score Sum  3

## 2023-11-24 NOTE — Progress Notes (Signed)
 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  His oxygen requirement has progressively increased to 50 L/min high flow nasal cannula by this morning. Chest Xray stable   Assessment 33 year old male with prior history of HIV/AIDS, noncompliance with medications, epilepsy admitted with subacute cough and dyspnea, also seizure.  Currently being treated for  # Possible PJP PNA with possible IRIS  # Leukocytosis-sudden bump after initiation of IV steroid, unlikely new source of infection  # Hyponatremia/hyperkalemia-in the setting of IV Bactrim , plan to continue as long as able to manage electrolyte abnormalities  # Low CMV viremia - unlikely to have an end organ disease, expected finding in poorly controlled HIV. Repeat CMV ordered   Recommendations Continue IV Bactrim  as is with daily lokelma  PCCM managing IV steroids, concerns for IRIS  Daily CBC and BMP Fu CMV, blastomyces ag and histoplasma ag in urine ordered Standard isolation precautions Dr Zelda Hickman to check on this weekend, I am back Monday   Thank you for the consult. Please page with pertinent questions or  concerns.  ______________________________________________________________________ Subjective patient seen and examined at the bedside. Mother at bedside. Feels very anxious about needing to go to ICU. Unable to produce phlegm.   Vitals BP (!) 138/94   Pulse (!) 121   Temp 98.5 F (36.9 C) (Temporal)   Resp (!) 26   Ht 5' 7 (1.702 m)   Wt 51.2 kg   SpO2 92%   BMI 17.68 kg/m     Physical Exam Constitutional:  adult male sitting up in the bed    Comments: on 50L/min in HFNC  Cardiovascular:     Rate and Rhythm: Normal rate and regular rhythm.     Heart sounds:   Pulmonary:     Effort: Pulmonary effort increased    Comments: coarse breath sounds b/l   Abdominal:     Palpations: Abdomen is non distended    Tenderness:   Musculoskeletal:        General: No swelling or tenderness.   Skin:    Comments: no rashes   Neurological:     General: awake alert and oriented   Psychiatric:        Mood and Affect: Mood normal.   Pertinent Microbiology Results for orders placed or performed during the hospital encounter of 11/08/23  Culture, blood (Routine X 2) w Reflex to ID Panel     Status: None   Collection Time: 11/08/23  4:18 AM   Specimen: BLOOD  Result Value Ref Range Status   Specimen Description BLOOD BLOOD RIGHT ARM  Final   Special Requests   Final    BOTTLES DRAWN AEROBIC AND ANAEROBIC Blood Culture adequate volume   Culture   Final    NO GROWTH 5  DAYS Performed at [facility], [address] [place], [place] [address]    Report Status 11/13/2023 FINAL  Final  Culture, blood (Routine X 2) w Reflex to ID Panel     Status: None   Collection Time: 11/08/23  4:22 AM   Specimen: BLOOD  Result Value Ref Range Status   Specimen Description BLOOD BLOOD RIGHT ARM  Final   Special Requests   Final    BOTTLES DRAWN AEROBIC AND ANAEROBIC Blood Culture adequate volume   Culture   Final    NO GROWTH 5 DAYS Performed at [facility], [address] [place], [place] [address]    Report Status 11/13/2023 FINAL  Final  Respiratory (~20 pathogens) panel by PCR     Status: None   Collection Time: 11/08/23  8:42 AM   Specimen: Nasopharyngeal Swab; Respiratory  Result Value Ref Range Status   Adenovirus NOT DETECTED NOT DETECTED Final   Coronavirus 229E NOT DETECTED NOT DETECTED Final    Comment: (NOTE) The Coronavirus on the Respiratory Panel, DOES NOT test for the novel  Coronavirus (2019 nCoV)    Coronavirus HKU1 NOT DETECTED NOT DETECTED Final   Coronavirus NL63 NOT DETECTED NOT DETECTED Final   Coronavirus OC43 NOT DETECTED NOT DETECTED Final   Metapneumovirus NOT DETECTED NOT DETECTED Final   Rhinovirus / Enterovirus NOT DETECTED NOT DETECTED Final   Influenza A NOT DETECTED NOT DETECTED Final   Influenza B NOT DETECTED NOT DETECTED Final   Parainfluenza Virus 1 NOT DETECTED NOT DETECTED Final   Parainfluenza Virus 2 NOT DETECTED NOT DETECTED Final   Parainfluenza Virus 3 NOT DETECTED NOT DETECTED Final   Parainfluenza Virus 4 NOT DETECTED NOT DETECTED Final   Respiratory Syncytial Virus NOT DETECTED NOT DETECTED Final   Bordetella pertussis NOT DETECTED NOT DETECTED Final   Bordetella Parapertussis NOT DETECTED NOT DETECTED Final   Chlamydophila pneumoniae NOT DETECTED NOT DETECTED Final   Mycoplasma pneumoniae NOT DETECTED NOT DETECTED Final    Comment: Performed at [facility], [address], [place], [place] [address]  Expectorated Sputum Assessment w Gram Stain, Rflx to Resp Cult     Status: None   Collection Time: 11/17/23  2:20 PM   Specimen: Expectorated Sputum  Result Value Ref Range Status   Specimen Description EXPECTORATED SPUTUM  Final   Special Requests Immunocompromised  Final   Sputum evaluation   Final    THIS SPECIMEN IS ACCEPTABLE FOR SPUTUM CULTURE Performed at [facility], [address] [place], [place] [address]    Report Status 11/17/2023 FINAL  Final  Culture, Respiratory w Gram  Stain     Status: None   Collection Time: 11/17/23  2:20 PM  Result Value Ref Range Status   Specimen Description EXPECTORATED SPUTUM  Final   Special Requests Immunocompromised Reflexed from [id number]  Final   Gram Stain NO WBC SEEN FEW GRAM POSITIVE COCCI IN PAIRS   Final   Culture   Final    MODERATE Normal respiratory flora-no Staph aureus or Pseudomonas seen Performed at [facility], [address], [place], [place] [address]    Report Status 11/19/2023 FINAL  Final   Pertinent Lab.    Latest Ref Rng & Units 11/24/2023    3:55 AM 11/22/2023    3:53 AM 11/21/2023    5:00 AM  CBC  WBC 4.0 - 10.5 K/uL 20.9  9.6  9.1   Hemoglobin 13.0 -  17.0 g/dL 9.8  16.1  09.6   Hematocrit 39.0 - 52.0 % 28.4  30.6  31.5   Platelets 150 - 400 K/uL 324  337  345       Latest Ref Rng & Units 11/24/2023    3:55 AM 11/23/2023    3:57 AM 11/22/2023    3:53 AM  CMP  Glucose 70 - 99 mg/dL 045  409  93   BUN 6 - 20 mg/dL 20  18  24    Creatinine 0.61 - 1.24 mg/dL 8.11  9.14  7.82   Sodium 135 - 145 mmol/L 127  128  128   Potassium 3.5 - 5.1 mmol/L 5.3  5.3  4.5   Chloride 98 - 111 mmol/L 87  88  91   CO2 22 - 32 mmol/L 29  31  31    Calcium 8.9 - 10.3 mg/dL 9.2  9.3  8.8    Pertinent Imaging today Plain films and CT images have been personally visualized and interpreted; radiology reports have been reviewed. Decision making incorporated into the Impression /   DG CHEST PORT 1 VIEW Result Date: 11/24/2023 CLINICAL DATA:  Shortness of breath. EXAM: PORTABLE CHEST 1 VIEW COMPARISON:  11/22/2023 FINDINGS: Stable cardiomediastinal contours. Diffuse airspace opacities are again noted and appear unchanged from previous exam. No signs of pneumothorax. There is blunting of the costophrenic angles. Visualized osseous structures are unremarkable. IMPRESSION: 1. No change in diffuse airspace opacities compatible with pulmonary edema or multifocal pneumonia. 2. Small bilateral pleural effusions. Electronically  Signed   By: Kimberley Penman M.D.   On: 11/24/2023 05:10     I spent 50 minutes involved in face-to-face and non-face-to-face activities for this patient on the day of the visit. Professional time spent includes the following activities: Preparing to see the patient (review of tests), Obtaining and reviewing separately obtained history (admission/discharge record), Performing a medically appropriate examination and evaluation , Ordering medications/labs, referring and communicating with other health care professionals, Documenting clinical information in the EMR, Independently interpreting results (not separately reported), Communicating results to the patient/family/caregiver, Counseling and educating the patient/family/caregiver and Care coordination (not separately reported).   Plan d/w requesting provider as well as ID pharm D  Of note, portions of this note may have been created with voice recognition software. While this note has been edited for accuracy, occasional wrong-word or 'sound-a-like' substitutions may have occurred due to the inherent limitations of voice recognition software.   Electronically signed by:   Terre Ferri, MD Infectious Disease Physician [facility] Infectious Disease Pager: [phone]

## 2023-11-24 NOTE — Progress Notes (Signed)
 PT Cancellation Note  Patient Details Name: Cory Russell MRN: [id number] DOB: Oct 21, 1990   Cancelled Treatment:    Reason Eval/Treat Not Completed: Medical issues which prohibited therapy;Fatigue/lethargy limiting ability to participate (Per RN, pt destating and required rapid response. Requested therapies hold for the day. PT will continue to follow acutely.)  Glenford Lanes, PT, DPT Acute Rehabilitation Services Office: [phone] Secure Chat Preferred  Riva Chester 11/24/2023, 10:11 AM

## 2023-11-24 NOTE — Progress Notes (Signed)
 NAME:  Cory Russell, MRN:  [id number], DOB:  [birth year], LOS: 16 ADMISSION DATE:  11/08/2023, CONSULTATION DATE:  11/24/2023 REFERRING MD:  Enrigue Harvard, DO, CHIEF COMPLAINT:  respiratory failure   History of Present Illness:  Cory Russell is a 33 y.o. man with past medical history of HIV/AIDS, CD4<35 on admission, not on ARVs. Presents with seizure like activity 11 days ago and found to have sepsis from pneumonia.  He notes symptoms started about 2-3 weeks ago with shortness of breath and dry cough. He had two separate rounds of antibiotics visiting other urgent care and ED visits. He has tried albuterol  nebulizer treatments from friends and initially found them helpful.   CT Chest shows bilateral ground glass opacities concerning for pneumoncystis pneumonia. Since being here he has been Started on empiric antibiotics with bactrim  and steroids. Also resumed ARVs during this admission. Has been persistently hypoxemic on 10LNC. Pulmonary consulted to assist with evaluation and management and for bronchoscopy.   He feels persistently short of breath, worse with exertion. He feels like chest PT is helping him. Feeling more anxious. Also having difficulty taking pills.   Pertinent  Medical History  HIV, Seizure disorder  Significant Hospital Events: Including procedures, antibiotic start and stop dates in addition to other pertinent events   6/4-admit 6/15 pulmonary consult 6/18 transient desats with worsening chest x-ray.  Prednisone  changed to IV Solu-Medrol  Interim History / Subjective:   Episodes of desats.  Now on heated high flow at 95% oxygen and 50 L  Objective    Blood pressure 130/84, pulse (!) 127, temperature 98.2 F (36.8 C), temperature source Oral, resp. rate (!) 24, height 5' 7 (1.702 m), weight 51.2 kg, SpO2 90%.    FiO2 (%):  [95 %] 95 %   Intake/Output Summary (Last 24 hours) at 11/24/2023 0850 Last data filed at 11/24/2023 0602 Gross per 24 hour  Intake 2780 ml   Output 400 ml  Net 2380 ml   Filed Weights   11/19/23 0336 11/20/23 0500 11/22/23 0600  Weight: 55.8 kg 50.8 kg 51.2 kg    Examination: Blood pressure 130/84, pulse (!) 127, temperature 98.2 F (36.8 C), temperature source Oral, resp. rate (!) 24, height 5' 7 (1.702 m), weight 51.2 kg, SpO2 90%. Gen:      No acute distress HEENT:  EOMI, sclera anicteric Neck:     No masses; no thyromegaly Lungs:    Scattered crackles CV:         Regular rate and rhythm; no murmurs Abd:      + bowel sounds; soft, non-tender; no palpable masses, no distension Ext:    No edema; adequate peripheral perfusion Neuro: alert and oriented x 3 Psych: normal mood and affect   Lab/imaging reviewed Chest x-ray diffuse bilateral pulmonary infiltrates Sodium 127, potassium 5.3, BUN/creatinine 20/1.05  Resolved problem list   Assessment and Plan  Acute hypoxemic respiratory failure Bilateral GGOs AIDS CD4<35, resumed biktarvy  this admission Suspected IRIS CMV Viremia Possible IRIS Worsening hypoxic respiratory failure with bilateral pulmonary infiltrates.  Suspect that he may be going to ARDS Low threshold for moving to ICU.  May need intubation if he continues to worsen Continue Bactrim , prednisone  per infectious disease for empiric treatment of pneumocystis.  The scan findings and elevated Fungitell are consistent with this. Antiretroviral treatment Will hold off on bronchoscopy as it is risky with high oxygen requirements. Changed to IV Solu-Medrol on 6/18 for worsening chest x-ray, concern for IRIS.   Add incentive  spirometer Lasix  40 mg x 1 to see if will help with respiratory status though he does appear euvolemic clinically  Signature:   Phyllis Breeze MD  Pulmonary & Critical care See Amion for pager  If no response to pager , please call [phone] until 7pm After 7:00 pm call Elink  [phone] 11/24/2023, 8:50 AM

## 2023-11-24 NOTE — Progress Notes (Addendum)
 RN reported O2 sat of 67% on 15 L high flow oxygen.  Patient is complaining about chest congestion and he is panicking that he is unable to clear his cough. Earlier in the evening patient declined chest PT.   Per chart review patient be admitted for PJP pneumonia, acute hypoxic respiratory failure development of immune reconstitution syndrome, acute hypoxic respiratory failure secondary to IRIS and PJP pneumonia.  Currently on steroid, Bactrim , scheduled labs. - Pending ABG.  Given DuoNeb breathing treatment, obtaining chest x-ray.  Placing nonrebreather.  Continue chest PT. Update, - Chest x-ray showing unchanged diffuse airspace disease compatible with pulmonary edema or multifocal pneumonia.   -After DuoNeb breathing treatment O2 sat has been improved currently maintaining 96% on 15 L high flow oxygen.  Jawana Reagor, MD [facility] 11/24/2023, 6:01 AM

## 2023-11-25 ENCOUNTER — Inpatient Hospital Stay (HOSPITAL_COMMUNITY)

## 2023-11-25 DIAGNOSIS — B20 Human immunodeficiency virus [HIV] disease: Secondary | ICD-10-CM | POA: Diagnosis not present

## 2023-11-25 DIAGNOSIS — B59 Pneumocystosis: Secondary | ICD-10-CM | POA: Diagnosis not present

## 2023-11-25 DIAGNOSIS — R6520 Severe sepsis without septic shock: Secondary | ICD-10-CM | POA: Diagnosis not present

## 2023-11-25 DIAGNOSIS — A419 Sepsis, unspecified organism: Secondary | ICD-10-CM | POA: Diagnosis not present

## 2023-11-25 DIAGNOSIS — J9601 Acute respiratory failure with hypoxia: Secondary | ICD-10-CM | POA: Diagnosis not present

## 2023-11-25 LAB — GLUCOSE, CAPILLARY: Glucose-Capillary: 187 mg/dL — ABNORMAL HIGH (ref 70–99)

## 2023-11-25 LAB — BASIC METABOLIC PANEL WITH GFR
Anion gap: 5 (ref 5–15)
BUN: 20 mg/dL (ref 6–20)
CO2: 34 mmol/L — ABNORMAL HIGH (ref 22–32)
Calcium: 9.2 mg/dL (ref 8.9–10.3)
Chloride: 88 mmol/L — ABNORMAL LOW (ref 98–111)
Creatinine, Ser: 0.96 mg/dL (ref 0.61–1.24)
GFR, Estimated: >60 mL/min (ref >60)
Glucose, Bld: 129 mg/dL — ABNORMAL HIGH (ref 70–99)
Potassium: 5.8 mmol/L — ABNORMAL HIGH (ref 3.5–5.1)
Sodium: 127 mmol/L — ABNORMAL LOW (ref 135–145)

## 2023-11-25 LAB — CYTOMEGALOVIRUS DNA, QUANTITATIVE REAL-TIME PCR, PLASMA
CMV DNA Quant: 11800 [IU]/mL
Log10 CMV Qn DNA Pl: 4.072 {Log_IU}/mL

## 2023-11-25 LAB — POCT I-STAT 7, (LYTES, BLD GAS, ICA,H+H)
Acid-Base Excess: 8 mmol/L — ABNORMAL HIGH (ref 0.0–2.0)
Bicarbonate: 33.7 mmol/L — ABNORMAL HIGH (ref 20.0–28.0)
Calcium, Ion: 1.24 mmol/L (ref 1.15–1.40)
HCT: 33 % — ABNORMAL LOW (ref 39.0–52.0)
Hemoglobin: 11.2 g/dL — ABNORMAL LOW (ref 13.0–17.0)
O2 Saturation: 89 %
Potassium: 4.8 mmol/L (ref 3.5–5.1)
Sodium: 128 mmol/L — ABNORMAL LOW (ref 135–145)
TCO2: 35 mmol/L — ABNORMAL HIGH (ref 22–32)
pCO2 arterial: 54.1 mmHg — ABNORMAL HIGH (ref 32–48)
pH, Arterial: 7.403 (ref 7.35–7.45)
pO2, Arterial: 57 mmHg — ABNORMAL LOW (ref 83–108)

## 2023-11-25 LAB — CBC
HCT: 29.8 % — ABNORMAL LOW (ref 39.0–52.0)
Hemoglobin: 10.1 g/dL — ABNORMAL LOW (ref 13.0–17.0)
MCH: 28.9 pg (ref 26.0–34.0)
MCHC: 33.9 g/dL (ref 30.0–36.0)
MCV: 85.1 fL (ref 80.0–100.0)
Platelets: 343 10*3/uL (ref 150–400)
RBC: 3.5 MIL/uL — ABNORMAL LOW (ref 4.22–5.81)
RDW: 15.8 % — ABNORMAL HIGH (ref 11.5–15.5)
WBC: 17.5 10*3/uL — ABNORMAL HIGH (ref 4.0–10.5)
nRBC: 0.1 % (ref 0.0–0.2)

## 2023-11-25 MED ORDER — NOREPINEPHRINE 4 MG/250ML-% IV SOLN
0.0000 ug/min | INTRAVENOUS | Status: DC
  Administered 2023-11-27: 2 ug/min via INTRAVENOUS
  Filled 2023-11-25: qty 250

## 2023-11-25 MED ORDER — SODIUM ZIRCONIUM CYCLOSILICATE 10 G PO PACK
10.0000 g | PACK | Freq: Every day | ORAL | Status: DC
  Filled 2023-11-25: qty 1

## 2023-11-25 MED ORDER — LORAZEPAM 2 MG/ML IJ SOLN
1.0000 mg | Freq: Once | INTRAMUSCULAR | Status: AC
  Administered 2023-11-25: 1 mg via INTRAVENOUS

## 2023-11-25 MED ORDER — LORAZEPAM 2 MG/ML IJ SOLN
1.0000 mg | INTRAMUSCULAR | Status: DC | PRN
  Filled 2023-11-25: qty 1

## 2023-11-25 MED ORDER — MORPHINE SULFATE (PF) 2 MG/ML IV SOLN
1.0000 mg | INTRAVENOUS | Status: DC | PRN
  Administered 2023-11-25: 1 mg via INTRAVENOUS
  Filled 2023-11-25: qty 1

## 2023-11-25 MED ORDER — NEBIVOLOL HCL 5 MG PO TABS
5.0000 mg | ORAL_TABLET | Freq: Every day | ORAL | Status: DC
  Administered 2023-11-25: 5 mg via ORAL
  Filled 2023-11-25 (×2): qty 1

## 2023-11-25 MED ORDER — MIDAZOLAM HCL 2 MG/2ML IJ SOLN
2.0000 mg | Freq: Once | INTRAMUSCULAR | Status: DC
  Filled 2023-11-25: qty 2

## 2023-11-25 MED ORDER — PROPOFOL 1000 MG/100ML IV EMUL
0.0000 ug/kg/min | INTRAVENOUS | Status: DC
  Administered 2023-11-26: 20 ug/kg/min via INTRAVENOUS
  Filled 2023-11-25: qty 100

## 2023-11-25 MED ORDER — SODIUM ZIRCONIUM CYCLOSILICATE 10 G PO PACK
10.0000 g | PACK | Freq: Two times a day (BID) | ORAL | Status: AC
  Administered 2023-11-25 (×2): 10 g via ORAL
  Filled 2023-11-25 (×2): qty 1

## 2023-11-25 MED ORDER — CHLORHEXIDINE GLUCONATE CLOTH 2 % EX PADS
6.0000 | MEDICATED_PAD | Freq: Every day | CUTANEOUS | Status: DC
  Administered 2023-11-26 – 2023-12-16 (×21): 6 via TOPICAL

## 2023-11-25 MED ORDER — FUROSEMIDE 10 MG/ML IJ SOLN
60.0000 mg | Freq: Once | INTRAMUSCULAR | Status: AC
  Administered 2023-11-25: 60 mg via INTRAVENOUS
  Filled 2023-11-25: qty 6

## 2023-11-25 MED ORDER — ALPRAZOLAM 0.5 MG PO TABS
1.0000 mg | ORAL_TABLET | Freq: Three times a day (TID) | ORAL | Status: DC | PRN
  Administered 2023-11-25 (×2): 1 mg via ORAL
  Filled 2023-11-25 (×3): qty 2

## 2023-11-25 MED ORDER — ETOMIDATE 2 MG/ML IV SOLN
10.0000 mg | Freq: Once | INTRAVENOUS | Status: AC
  Administered 2023-11-25: 10 mg via INTRAVENOUS
  Filled 2023-11-25: qty 10

## 2023-11-25 MED ORDER — KETAMINE HCL 50 MG/5ML IJ SOSY
50.0000 mg | PREFILLED_SYRINGE | Freq: Once | INTRAMUSCULAR | Status: DC
  Filled 2023-11-25: qty 5

## 2023-11-25 MED ORDER — METOPROLOL TARTRATE 5 MG/5ML IV SOLN: 2.5000 mg | Freq: Four times a day (QID) | INTRAVENOUS | Status: AC | PRN

## 2023-11-25 MED ORDER — ROCURONIUM BROMIDE 10 MG/ML (PF) SYRINGE
30.0000 mg | PREFILLED_SYRINGE | Freq: Once | INTRAVENOUS | Status: AC
  Administered 2023-11-25: 30 mg via INTRAVENOUS
  Filled 2023-11-25: qty 10

## 2023-11-25 MED ORDER — SODIUM CHLORIDE 0.9 % IV SOLN
250.0000 mL | INTRAVENOUS | Status: AC
  Administered 2023-11-26: 250 mL via INTRAVENOUS

## 2023-11-25 NOTE — Progress Notes (Signed)
 Despite being on heated high flow nasal cannula, patient desats into the low 70s when he falls asleep. Patient is arousable and will follow commands but he requires HHFNC and non rebreather to maintain stats in the high 80s. Respiratory called to place a BiPap on patient.

## 2023-11-25 NOTE — Progress Notes (Signed)
   NAME:  Cory Russell, MRN:  [id number], DOB:  1990/11/17, LOS: 17 ADMISSION DATE:  11/08/2023, CONSULTATION DATE:  11/25/2023 REFERRING MD:  Willette Adriana LABOR, MD, CHIEF COMPLAINT:  respiratory failure   History of Present Illness:  Cory Russell is a 33 y.o. man with past medical history of HIV/AIDS, CD4<35 on admission, not on ARVs. Presents with seizure like activity 11 days ago and found to have sepsis from pneumonia.  He notes symptoms started about 2-3 weeks ago with shortness of breath and dry cough. He had two separate rounds of antibiotics visiting other urgent care and ED visits. He has tried albuterol  nebulizer treatments from friends and initially found them helpful.   CT Chest shows bilateral ground glass opacities concerning for pneumoncystis pneumonia. Since being here he has been Started on empiric antibiotics with bactrim  and steroids. Also resumed ARVs during this admission. Has been persistently hypoxemic on 10LNC. Pulmonary consulted to assist with evaluation and management and for bronchoscopy.   He feels persistently short of breath, worse with exertion. He feels like chest PT is helping him. Feeling more anxious. Also having difficulty taking pills.   Pertinent  Medical History  HIV, Seizure disorder  Significant Hospital Events: Including procedures, antibiotic start and stop dates in addition to other pertinent events   6/4-admit 6/15 pulmonary consult 6/18 transient desats with worsening chest x-ray.  Prednisone  changed to IV Solu-Medrol   Interim History / Subjective:  Continues to be on HHFNC 60LPM 95% FiO2. Sats at rest are from 88-95%.  Feeling anxious but feels he is a little better today. Mom and aunt at bedside.   Objective    Blood pressure 120/83, pulse (!) 122, temperature 98.4 F (36.9 C), temperature source Oral, resp. rate (!) 22, height 5' 7 (1.702 m), weight 51.2 kg, SpO2 92%.    FiO2 (%):  [90 %-95 %] 95 %   Intake/Output Summary (Last 24  hours) at 11/25/2023 1502 Last data filed at 11/25/2023 1402 Gross per 24 hour  Intake 1305.23 ml  Output 1675 ml  Net -369.77 ml   Filed Weights   11/19/23 0336 11/20/23 0500 11/22/23 0600  Weight: 55.8 kg 50.8 kg 51.2 kg    Examination: Chronically and acutely ill appearing young man on HHFNC Tachynpic, breath sounds diminished, shallow respirations Tachycardic, regular Extremities thin, no edema   Lab/imaging reviewed Chest xray yesterday continues   Resolved problem list   Assessment and Plan  Acute hypoxemic respiratory failure Severe Pneumocystis pneumonia AIDS CD4<35, resumed biktarvy  this admission Suspected IRIS Anxiety  Continue Bactrim , solumedrol He seems appropriate for currently level of care, but if clinical decline low threshold to transfer to ICU.  Suspect this will take a long time Ok for ativan  prn for anxiety from my perspective.   Continue chest pt, cough syrup, pain control.  Family at bedside. Questions answered.  Pccm will follow  Verdon Gore, MD Pulmonary and Critical Care Medicine [facility] 11/25/2023 3:09 PM Pager: see AMION  If no response to pager, please call critical care on call (see AMION) until 7pm After 7:00 pm call Elink

## 2023-11-25 NOTE — Plan of Care (Signed)

## 2023-11-25 NOTE — Progress Notes (Signed)
 Patient is currently on HHFNC at 60L on 100% with a non-rebreather with a saturation of 89%. Patient is arousable and alert and oriented X4. He will follow commands but he is very lethargic/sleepy. Elgergawy MD had been notified. See orders.

## 2023-11-25 NOTE — Progress Notes (Signed)
   11/25/23 2235  BiPAP/CPAP/SIPAP  $ Non-Invasive Ventilator  Non-Invasive Vent Set Up;Non-Invasive Vent Initial  $ Face Mask Medium Yes  BiPAP/CPAP/SIPAP Pt Type Adult  BiPAP/CPAP/SIPAP SERVO  Mask Type Full face mask  Dentures removed? Not applicable  Mask Size Medium  Set Rate 20 breaths/min  Respiratory Rate 41 breaths/min  IPAP 20 cmH20  EPAP 10 cmH2O  Pressure Support 10 cmH20  PEEP 10 cmH20  FiO2 (%) 100 %  Minute Ventilation 19.8  Leak 22  Peak Inspiratory Pressure (PIP) 16  Tidal Volume (Vt) 562  Patient Home Machine No  Patient Home Mask No  Patient Home Tubing No  Auto Titrate No  Press High Alarm 25 cmH2O  Press Low Alarm 5 cmH2O  Device Plugged into RED Power Outlet Yes  BiPAP/CPAP /SiPAP Vitals  Pulse Rate (!) 102  Resp (!) 41  SpO2 96 %  MEWS Score/Color  MEWS Score 4  MEWS Score Color Red     Pt placed on BIPAP per MD order.

## 2023-11-25 NOTE — Progress Notes (Signed)
 Patient with acute respiratory distress at change of shift this AM.  Offgoing RN administered PRN medication for cough/pain and respiratory therapy delivered nebulizer treatments, both PRN and scheduled.  O2 saturations dropped to 70% range; responded after approximately 5 minutes with increased O2 per heated high flow nasal canula in addition to temporary non-rebreather placement at 10L/m.  Dr. Twana made aware; requested orders. Continuing to monitor closely.    11/25/23 0715  Assess: MEWS Score  Pulse Rate (!) 118  Resp (!) 34  Level of Consciousness Alert  SpO2 (!) 75 %  O2 Device HHFNC  Patient Activity (if Appropriate) In bed  O2 Flow Rate (L/min) 50 L/min  Assess: MEWS Score  MEWS Temp 0  MEWS Systolic 0  MEWS Pulse 2  MEWS RR 2  MEWS LOC 0  MEWS Score 4  MEWS Score Color Red  Assess: if the MEWS score is Yellow or Red  Were vital signs accurate and taken at a resting state? Yes  Does the patient meet 2 or more of the SIRS criteria? Yes  Does the patient have a confirmed or suspected source of infection? Yes  MEWS guidelines implemented  Yes, red  Treat  MEWS Interventions Considered administering scheduled or prn medications/treatments as ordered  Take Vital Signs  Increase Vital Sign Frequency  Red: Q1hr x2, continue Q4hrs until patient remains green for 12hrs  Escalate  MEWS: Escalate Red: Discuss with charge nurse and notify provider. Consider notifying RRT. If remains red for 2 hours consider need for higher level of care  Notify: Charge Nurse/RN  Name of Charge Nurse/RN Notified Harlene Kraft, RN  Provider Notification  Provider Name/Title Shamehdi  Date Provider Notified 11/25/23  Time Provider Notified 0720  Method of Notification Page  Notification Reason Change in status  Provider response See new orders (MD to round)  Date of Provider Response 11/25/23  Time of Provider Response 0855  Assess: SIRS CRITERIA  SIRS Temperature  0  SIRS Respirations   1  SIRS Pulse 1  SIRS WBC 1  SIRS Score Sum  3     11/25/23 0853  Vitals  Pulse Rate (!) 122  ECG Heart Rate (!) 127  Resp (!) 22  Level of Consciousness  Level of Consciousness Alert  Oxygen Therapy  SpO2 92 %

## 2023-11-25 NOTE — Significant Event (Addendum)
 Rapid Response Event Note   Reason for Call :  Hypoxia. Pt SpO2 80s on 60L HHFNC 100%. RT to bedside.  NRB placed over HHFNC with SpO2 increasing to 89%.   Initial Focused Assessment:  Pt lying in bed with eyes closed. He is arousable and able to answer questions with one word responses. He feels very SOB. His breathing is tachypneic and labored. He is using his accessory muscles to breathe. Lungs clear/diminished in the bases. Skin hot to touch.    HR-100, BP-127/83, RR-50s, SpO2-90% on 60L HHFNC and NRB. Pt placed on 100% bipap 16/6(increased to 20/10) with SpO2 increasing to 94% and RR continuing in the 50s.  Interventions:  Bipap Lasix  60mg  IV Tx to ICU Plan of Care:  Tx to 2H05.   Event Summary:   MD Notified: Dr. Sherlon notified by bedside RN, PCCM Gleason to bedside.  Call Time:2200 Arrival Time:2205 End Time:2319  Tish Graeme Piety, RN

## 2023-11-25 NOTE — Progress Notes (Signed)
 Progress Note   Patient: Cory Russell FMW:[id number] DOB: 09/21/1990 DOA: 11/08/2023     17 DOS: the patient was seen and examined on 11/25/2023 at 9:40AM      Brief hospital course: 33 y.o. M with HIV/AIDS, epilepsy, nonadherent to medications who presented with subacute cough and dyspnea, finally had seizure and presented to the hospital.    Found to have pneumonia with high O2 needs, bilateral infiltrates, undetectable CD4 count.  Admitted on treatment for PJP.  Very slow if any towards recovery ===============================================================  Subjective:  The patient was seen and examined this morning, awake alert oriented, able to speak in full sentences, O2 heated high flow by nasal cannula rate of 60, satting 92% Tachypneic with a heart rate of 122  ------------------------------------------------------------------------------------------------------------------------------------  Assessment and Plan:  * Severe sepsis due to PJP pneumonia Still meeting sepsis criteria with acute respiratory failure, tachycardia, tachypnea  - ID following, following closely, titrating antibiotics - Bactrim  and azithromycin  -Pulm following as well: May need ICU level care - Continue high flow nasal cannula - Nebs - Treat anxiety - Solu-Medrol  - Chest PT  Hyperkalemia -Potassium persistently elevated likely due to Bactrim  today 5.8 -Lokelma  -Given a dose of Lasix  on 6/20  Hyponatremia - Hold fluoxetine  - Continue salt tabs -Daily lab  Acute hypoxemic respiratory failure (HCC) - Waxing and waning, again requiring  HFNC -Currently on high flow heated oxygen via nasal cannula rate of 60 FiO2 of 95 satting 92%, tachycardic with rate of 122, RR of 22  Due to PJP pneumonia - Wean oxygen as able-continuing antibiotics, IV steroids -May need close follow-up in the ICU, appreciate Dr. Rosezena Patient has chosen not to go to ICU  Condyloma acuminata Per notes from Dr.  Trixie: patient complaining of soiling himself often following his surgery with Dr Debby last year. 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 Dr Debby and she will arrange for outpatient follow up    Seizure disorder [facility]) Seizure -stable Had seizures prior to admission due to nonadherence to AEDs.  MRI brain normal.  No concern for intracranial infection since admission.  No further seizures since resuming Depakote . -Continue meds and seizure precautions   anxiety - Increasing dosage of Xanax , as needed Ativan   Advanced HIV disease, AIDS  VL at 306K, CD4 < 35 on admitt on 6/4, Has been off of ART for quite sometime.   --Per ID  Nutrition Status: Nutrition Problem: Severe Malnutrition Etiology: chronic illness (HIV) Signs/Symptoms: severe muscle depletion, severe fat depletion Interventions: Ensure Enlive (each supplement provides 350kcal and 20 grams of protein), MVI, Magic cup      Physical Exam: BP 120/83 (BP Location: Left Arm)   Pulse (!) 122   Temp 98.4 F (36.9 C) (Oral)   Resp (!) 22   Ht 5' 7 (1.702 m)   Wt 51.2 kg   SpO2 92%   BMI 17.68 kg/m      General:  AAO x 3,  cooperative, shortness of breath, able to complete sentences, tachypneic, tachycardic   HEENT:  Normocephalic, PERRL, otherwise with in Normal limits   Neuro:  CNII-XII intact. , normal motor and sensation, reflexes intact   Lungs:   Clear to auscultation BL, Respirations unlabored,  No wheezes / crackles  Cardio:    S1/S2, tachycardic no murmure, No Rubs or Gallops   Abdomen:  Soft, non-tender, bowel sounds active all four quadrants, no guarding or peritoneal signs.  Muscular  skeletal:  Limited exam -global generalized weaknesses - in bed, able to move all 4 extremities,   2+ pulses,  symmetric, No pitting edema  Skin:  Dry, warm to touch, negative for any Rashes,  Wounds: Please see nursing documentation         Data Reviewed: All labs imaging, medication reviewed in detail    Latest Ref Rng & Units 11/25/2023    6:05 AM 11/24/2023    3:55 AM 11/23/2023    3:57 AM  CMP  Glucose 70 - 99 mg/dL 870  822  865   BUN 6 - 20 mg/dL 20  20  18    Creatinine 0.61 - 1.24 mg/dL 9.03  8.94  8.99   Sodium 135 - 145 mmol/L 127  127  128   Potassium 3.5 - 5.1 mmol/L 5.8  5.3  5.3   Chloride 98 - 111 mmol/L 88  87  88   CO2 22 - 32 mmol/L 34  29  31   Calcium  8.9 - 10.3 mg/dL 9.2  9.2  9.3       Latest Ref Rng & Units 11/25/2023    6:05 AM 11/24/2023    3:55 AM 11/22/2023    3:53 AM  CBC  WBC 4.0 - 10.5 K/uL 17.5  20.9  9.6   Hemoglobin 13.0 - 17.0 g/dL 89.8  9.8  89.4   Hematocrit 39.0 - 52.0 % 29.8  28.4  30.6   Platelets 150 - 400 K/uL 343  324  337      Family Communication: Mother at the bedside    Disposition:-Pending due to high demand for oxygen, hypoxia, Likely home eventually once O2 demand improved   Status is: Inpatient Patient with untreated HIV/AIDS, presents with severe PJP pneumonia  ID and pulmonology consulted, suspect his clinical course at this time is as expected for severe PJP with IRIS, will continue current cares, expect prolonged recovery  DVT prophylaxis SCDs     SIGNED: Adriana DELENA Grams, md Critical care time 55 minutes [facility],  Pager (please use [email or web address] to page/text)  Please use Epic Secure Chat for non-urgent communication (7AM-7PM) If 7PM-7AM, please contact night-coverage [email or web address],  11/25/2023, 2:33 PM  For on call review [email or web address].

## 2023-11-25 NOTE — Progress Notes (Signed)
   11/25/23 2105  Spiritual Encounters  Type of Visit Follow up  Care provided to: Patient  Reason for visit Routine spiritual support  OnCall Visit Yes  Spiritual Framework  Presenting Themes Meaning/purpose/sources of inspiration;Impactful experiences and emotions;Values and beliefs  Community/Connection Family;Faith community  Strengths family, prayer, relationship with God  Needs/Challenges/Barriers Slow road to recovery  Patient Stress Factors Health changes;Family relationships;Major life changes  Family Stress Factors Health changes  Interventions  Spiritual Care Interventions Made Established relationship of care and support;Reconciliation with self/others;Explored values/beliefs/practices/strengths;Encouragement;Reflective listening  Intervention Outcomes  Outcomes Awareness of health;Awareness of support;Reduced anxiety;Awareness around self/spiritual resourses   Chaplain follow up with Pt.  was in good spirits and expressed gratitude for the visit.  Pt shared that his doctors reported positive progress with his condition. Pt appeared encouraged and was glad to receive spiritual support.

## 2023-11-26 ENCOUNTER — Inpatient Hospital Stay (HOSPITAL_COMMUNITY)

## 2023-11-26 DIAGNOSIS — J9601 Acute respiratory failure with hypoxia: Secondary | ICD-10-CM | POA: Diagnosis not present

## 2023-11-26 DIAGNOSIS — B259 Cytomegaloviral disease, unspecified: Secondary | ICD-10-CM | POA: Diagnosis not present

## 2023-11-26 DIAGNOSIS — B59 Pneumocystosis: Secondary | ICD-10-CM | POA: Diagnosis not present

## 2023-11-26 DIAGNOSIS — D72829 Elevated white blood cell count, unspecified: Secondary | ICD-10-CM | POA: Diagnosis not present

## 2023-11-26 DIAGNOSIS — N179 Acute kidney failure, unspecified: Secondary | ICD-10-CM | POA: Diagnosis not present

## 2023-11-26 DIAGNOSIS — Z930 Tracheostomy status: Secondary | ICD-10-CM

## 2023-11-26 DIAGNOSIS — B20 Human immunodeficiency virus [HIV] disease: Secondary | ICD-10-CM | POA: Diagnosis not present

## 2023-11-26 DIAGNOSIS — E875 Hyperkalemia: Secondary | ICD-10-CM | POA: Diagnosis not present

## 2023-11-26 LAB — POCT I-STAT 7, (LYTES, BLD GAS, ICA,H+H)
Acid-Base Excess: 6 mmol/L — ABNORMAL HIGH (ref 0.0–2.0)
Acid-Base Excess: 7 mmol/L — ABNORMAL HIGH (ref 0.0–2.0)
Acid-Base Excess: 8 mmol/L — ABNORMAL HIGH (ref 0.0–2.0)
Acid-Base Excess: 8 mmol/L — ABNORMAL HIGH (ref 0.0–2.0)
Acid-Base Excess: 8 mmol/L — ABNORMAL HIGH (ref 0.0–2.0)
Acid-Base Excess: 7 mmol/L — ABNORMAL HIGH (ref 0.0–2.0)
Bicarbonate: 32.7 mmol/L — ABNORMAL HIGH (ref 20.0–28.0)
Bicarbonate: 37.6 mmol/L — ABNORMAL HIGH (ref 20.0–28.0)
Bicarbonate: 39.4 mmol/L — ABNORMAL HIGH (ref 20.0–28.0)
Bicarbonate: 37.8 mmol/L — ABNORMAL HIGH (ref 20.0–28.0)
Bicarbonate: 34.6 mmol/L — ABNORMAL HIGH (ref 20.0–28.0)
Bicarbonate: 36.1 mmol/L — ABNORMAL HIGH (ref 20.0–28.0)
Calcium, Ion: 1.14 mmol/L — ABNORMAL LOW (ref 1.15–1.40)
Calcium, Ion: 1.22 mmol/L (ref 1.15–1.40)
Calcium, Ion: 1.2 mmol/L (ref 1.15–1.40)
Calcium, Ion: 1.16 mmol/L (ref 1.15–1.40)
Calcium, Ion: 1.17 mmol/L (ref 1.15–1.40)
Calcium, Ion: 1.21 mmol/L (ref 1.15–1.40)
HCT: 28 % — ABNORMAL LOW (ref 39.0–52.0)
HCT: 31 % — ABNORMAL LOW (ref 39.0–52.0)
HCT: 31 % — ABNORMAL LOW (ref 39.0–52.0)
HCT: 30 % — ABNORMAL LOW (ref 39.0–52.0)
HCT: 26 % — ABNORMAL LOW (ref 39.0–52.0)
HCT: 27 % — ABNORMAL LOW (ref 39.0–52.0)
Hemoglobin: 9.5 g/dL — ABNORMAL LOW (ref 13.0–17.0)
Hemoglobin: 10.5 g/dL — ABNORMAL LOW (ref 13.0–17.0)
Hemoglobin: 10.5 g/dL — ABNORMAL LOW (ref 13.0–17.0)
Hemoglobin: 10.2 g/dL — ABNORMAL LOW (ref 13.0–17.0)
Hemoglobin: 8.8 g/dL — ABNORMAL LOW (ref 13.0–17.0)
Hemoglobin: 9.2 g/dL — ABNORMAL LOW (ref 13.0–17.0)
O2 Saturation: 94 %
O2 Saturation: 98 %
O2 Saturation: 100 %
O2 Saturation: 100 %
O2 Saturation: 100 %
O2 Saturation: 99 %
Patient temperature: 99.1
Patient temperature: 99
Patient temperature: 97.6
Patient temperature: 99
Patient temperature: 99
Patient temperature: 37.5
Potassium: 4.5 mmol/L (ref 3.5–5.1)
Potassium: 5.7 mmol/L — ABNORMAL HIGH (ref 3.5–5.1)
Potassium: 5.9 mmol/L — ABNORMAL HIGH (ref 3.5–5.1)
Potassium: 6.1 mmol/L — ABNORMAL HIGH (ref 3.5–5.1)
Potassium: 5.7 mmol/L — ABNORMAL HIGH (ref 3.5–5.1)
Potassium: 6 mmol/L — ABNORMAL HIGH (ref 3.5–5.1)
Sodium: 129 mmol/L — ABNORMAL LOW (ref 135–145)
Sodium: 126 mmol/L — ABNORMAL LOW (ref 135–145)
Sodium: 127 mmol/L — ABNORMAL LOW (ref 135–145)
Sodium: 124 mmol/L — ABNORMAL LOW (ref 135–145)
Sodium: 124 mmol/L — ABNORMAL LOW (ref 135–145)
Sodium: 127 mmol/L — ABNORMAL LOW (ref 135–145)
TCO2: 34 mmol/L — ABNORMAL HIGH (ref 22–32)
TCO2: 40 mmol/L — ABNORMAL HIGH (ref 22–32)
TCO2: 42 mmol/L — ABNORMAL HIGH (ref 22–32)
TCO2: 40 mmol/L — ABNORMAL HIGH (ref 22–32)
TCO2: 36 mmol/L — ABNORMAL HIGH (ref 22–32)
TCO2: 38 mmol/L — ABNORMAL HIGH (ref 22–32)
pCO2 arterial: 57 mmHg — ABNORMAL HIGH (ref 32–48)
pCO2 arterial: 93.3 mmHg (ref 32–48)
pCO2 arterial: 99.2 mmHg (ref 32–48)
pCO2 arterial: 90.5 mmHg (ref 32–48)
pCO2 arterial: 60.9 mmHg — ABNORMAL HIGH (ref 32–48)
pCO2 arterial: 80.6 mmHg (ref 32–48)
pH, Arterial: 7.368 (ref 7.35–7.45)
pH, Arterial: 7.215 — ABNORMAL LOW (ref 7.35–7.45)
pH, Arterial: 7.203 — ABNORMAL LOW (ref 7.35–7.45)
pH, Arterial: 7.229 — ABNORMAL LOW (ref 7.35–7.45)
pH, Arterial: 7.363 (ref 7.35–7.45)
pH, Arterial: 7.262 — ABNORMAL LOW (ref 7.35–7.45)
pO2, Arterial: 75 mmHg — ABNORMAL LOW (ref 83–108)
pO2, Arterial: 137 mmHg — ABNORMAL HIGH (ref 83–108)
pO2, Arterial: 225 mmHg — ABNORMAL HIGH (ref 83–108)
pO2, Arterial: 249 mmHg — ABNORMAL HIGH (ref 83–108)
pO2, Arterial: 284 mmHg — ABNORMAL HIGH (ref 83–108)
pO2, Arterial: 179 mmHg — ABNORMAL HIGH (ref 83–108)

## 2023-11-26 LAB — BASIC METABOLIC PANEL WITH GFR
Anion gap: 11 (ref 5–15)
Anion gap: 5 (ref 5–15)
BUN: 15 mg/dL (ref 6–20)
BUN: 21 mg/dL — ABNORMAL HIGH (ref 6–20)
CO2: 30 mmol/L (ref 22–32)
CO2: 33 mmol/L — ABNORMAL HIGH (ref 22–32)
Calcium: 8.4 mg/dL — ABNORMAL LOW (ref 8.9–10.3)
Calcium: 8.9 mg/dL (ref 8.9–10.3)
Chloride: 87 mmol/L — ABNORMAL LOW (ref 98–111)
Chloride: 87 mmol/L — ABNORMAL LOW (ref 98–111)
Creatinine, Ser: 0.89 mg/dL (ref 0.61–1.24)
Creatinine, Ser: 1.31 mg/dL — ABNORMAL HIGH (ref 0.61–1.24)
GFR, Estimated: >60 mL/min (ref >60)
GFR, Estimated: >60 mL/min (ref >60)
Glucose, Bld: 184 mg/dL — ABNORMAL HIGH (ref 70–99)
Glucose, Bld: 197 mg/dL — ABNORMAL HIGH (ref 70–99)
Potassium: 4.4 mmol/L (ref 3.5–5.1)
Potassium: 5.8 mmol/L — ABNORMAL HIGH (ref 3.5–5.1)
Sodium: 125 mmol/L — ABNORMAL LOW (ref 135–145)
Sodium: 128 mmol/L — ABNORMAL LOW (ref 135–145)

## 2023-11-26 LAB — MAGNESIUM: Magnesium: 2.3 mg/dL (ref 1.7–2.4)

## 2023-11-26 LAB — GLUCOSE, CAPILLARY
Glucose-Capillary: 210 mg/dL — ABNORMAL HIGH (ref 70–99)
Glucose-Capillary: 168 mg/dL — ABNORMAL HIGH (ref 70–99)
Glucose-Capillary: 166 mg/dL — ABNORMAL HIGH (ref 70–99)
Glucose-Capillary: 201 mg/dL — ABNORMAL HIGH (ref 70–99)
Glucose-Capillary: 138 mg/dL — ABNORMAL HIGH (ref 70–99)
Glucose-Capillary: 133 mg/dL — ABNORMAL HIGH (ref 70–99)

## 2023-11-26 LAB — CBC
HCT: 28.8 % — ABNORMAL LOW (ref 39.0–52.0)
HCT: 29.8 % — ABNORMAL LOW (ref 39.0–52.0)
Hemoglobin: 10.2 g/dL — ABNORMAL LOW (ref 13.0–17.0)
Hemoglobin: 9.7 g/dL — ABNORMAL LOW (ref 13.0–17.0)
MCH: 29.2 pg (ref 26.0–34.0)
MCH: 29.4 pg (ref 26.0–34.0)
MCHC: 33.7 g/dL (ref 30.0–36.0)
MCHC: 34.2 g/dL (ref 30.0–36.0)
MCV: 85.4 fL (ref 80.0–100.0)
MCV: 87.3 fL (ref 80.0–100.0)
Platelets: 298 10*3/uL (ref 150–400)
Platelets: 309 10*3/uL (ref 150–400)
RBC: 3.3 MIL/uL — ABNORMAL LOW (ref 4.22–5.81)
RBC: 3.49 MIL/uL — ABNORMAL LOW (ref 4.22–5.81)
RDW: 15.7 % — ABNORMAL HIGH (ref 11.5–15.5)
RDW: 15.9 % — ABNORMAL HIGH (ref 11.5–15.5)
WBC: 16.6 10*3/uL — ABNORMAL HIGH (ref 4.0–10.5)
WBC: 18 10*3/uL — ABNORMAL HIGH (ref 4.0–10.5)
nRBC: 0 % (ref 0.0–0.2)
nRBC: 0 % (ref 0.0–0.2)

## 2023-11-26 LAB — LACTIC ACID, PLASMA
Lactic Acid, Venous: 1.5 mmol/L (ref 0.5–1.9)
Lactic Acid, Venous: 2.1 mmol/L (ref 0.5–1.9)

## 2023-11-26 LAB — HEMOGLOBIN A1C
Hgb A1c MFr Bld: 6.4 % — ABNORMAL HIGH (ref 4.8–5.6)
Mean Plasma Glucose: 136.98 mg/dL

## 2023-11-26 LAB — MRSA NEXT GEN BY PCR, NASAL: MRSA by PCR Next Gen: DETECTED — AB

## 2023-11-26 LAB — PHOSPHORUS: Phosphorus: 6.5 mg/dL — ABNORMAL HIGH (ref 2.5–4.6)

## 2023-11-26 LAB — TRIGLYCERIDES: Triglycerides: 99 mg/dL (ref <150)

## 2023-11-26 MED ORDER — VANCOMYCIN VARIABLE DOSE PER UNSTABLE RENAL FUNCTION (PHARMACIST DOSING): Status: DC

## 2023-11-26 MED ORDER — ORAL CARE MOUTH RINSE: 15.0000 mL | OROMUCOSAL | Status: DC | PRN

## 2023-11-26 MED ORDER — SODIUM ZIRCONIUM CYCLOSILICATE 10 G PO PACK
10.0000 g | PACK | Freq: Three times a day (TID) | ORAL | Status: AC
  Administered 2023-11-26 (×3): 10 g
  Filled 2023-11-26 (×3): qty 1

## 2023-11-26 MED ORDER — SODIUM CHLORIDE 0.9 % IV SOLN
0.0000 ug/kg/min | INTRAVENOUS | Status: DC
  Administered 2023-11-26: 3 ug/kg/min via INTRAVENOUS
  Administered 2023-11-27: 2 ug/kg/min via INTRAVENOUS
  Filled 2023-11-26 (×2): qty 20

## 2023-11-26 MED ORDER — FENTANYL 2500MCG IN NS 250ML (10MCG/ML) PREMIX INFUSION: 50.0000 ug/h | INTRAVENOUS | Status: DC

## 2023-11-26 MED ORDER — VANCOMYCIN HCL IN DEXTROSE 1-5 GM/200ML-% IV SOLN
1000.0000 mg | Freq: Once | INTRAVENOUS | Status: AC
  Administered 2023-11-26: 1000 mg via INTRAVENOUS
  Filled 2023-11-26: qty 200

## 2023-11-26 MED ORDER — FENTANYL 2500MCG IN NS 250ML (10MCG/ML) PREMIX INFUSION
0.0000 ug/h | INTRAVENOUS | Status: DC
  Administered 2023-11-26: 50 ug/h via INTRAVENOUS
  Filled 2023-11-26: qty 250

## 2023-11-26 MED ORDER — SODIUM CHLORIDE 0.9 % IV SOLN
5.0000 mg/kg | Freq: Two times a day (BID) | INTRAVENOUS | Status: DC
  Administered 2023-11-26 (×2): 255 mg via INTRAVENOUS
  Filled 2023-11-26 (×3): qty 5.1

## 2023-11-26 MED ORDER — ARTIFICIAL TEARS OPHTHALMIC OINT
1.0000 | TOPICAL_OINTMENT | Freq: Three times a day (TID) | OPHTHALMIC | Status: DC
Start: 2023-11-26 — End: 2023-11-29
  Administered 2023-11-26 – 2023-11-28 (×9): 1 via OPHTHALMIC

## 2023-11-26 MED ORDER — PROPOFOL 1000 MG/100ML IV EMUL: 25.0000 ug/kg/min | INTRAVENOUS | Status: DC

## 2023-11-26 MED ORDER — HEPARIN SODIUM (PORCINE) 5000 UNIT/ML IJ SOLN
5000.0000 [IU] | Freq: Three times a day (TID) | INTRAMUSCULAR | Status: DC
  Administered 2023-11-26 – 2023-12-16 (×62): 5000 [IU] via SUBCUTANEOUS
  Filled 2023-11-26 (×60): qty 1

## 2023-11-26 MED ORDER — FAMOTIDINE 20 MG PO TABS
20.0000 mg | ORAL_TABLET | Freq: Two times a day (BID) | ORAL | Status: DC
Start: 2023-11-26 — End: 2023-11-26

## 2023-11-26 MED ORDER — FENTANYL BOLUS VIA INFUSION: 25.0000 ug | INTRAVENOUS | Status: DC | PRN

## 2023-11-26 MED ORDER — PROSOURCE TF20 ENFIT COMPATIBL EN LIQD: 60.0000 mL | Freq: Every day | ENTERAL | Status: DC

## 2023-11-26 MED ORDER — PROPOFOL 1000 MG/100ML IV EMUL
25.0000 ug/kg/min | INTRAVENOUS | Status: DC
  Administered 2023-11-26: 70 ug/kg/min via INTRAVENOUS
  Filled 2023-11-26 (×2): qty 100

## 2023-11-26 MED ORDER — FENTANYL BOLUS VIA INFUSION: 50.0000 ug | INTRAVENOUS | Status: DC | PRN

## 2023-11-26 MED ORDER — PROSOURCE TF20 ENFIT COMPATIBL EN LIQD
20.0000 mL | Freq: Every day | ENTERAL | Status: DC
  Administered 2023-11-26 – 2023-12-04 (×9): 20 mL
  Filled 2023-11-26 (×9): qty 60

## 2023-11-26 MED ORDER — POLYETHYLENE GLYCOL 3350 17 G PO PACK
17.0000 g | PACK | Freq: Every day | ORAL | Status: DC
Start: 2023-11-26 — End: 2023-12-07
  Administered 2023-11-26 – 2023-12-03 (×5): 17 g
  Filled 2023-11-26 (×7): qty 1

## 2023-11-26 MED ORDER — FENTANYL CITRATE PF 50 MCG/ML IJ SOSY: 50.0000 ug | PREFILLED_SYRINGE | Freq: Once | INTRAMUSCULAR | Status: DC

## 2023-11-26 MED ORDER — MIDAZOLAM HCL 2 MG/2ML IJ SOLN
1.0000 mg | Freq: Once | INTRAMUSCULAR | Status: AC
  Administered 2023-11-25: 1 mg via INTRAVENOUS

## 2023-11-26 MED ORDER — PROPOFOL 1000 MG/100ML IV EMUL
25.0000 ug/kg/min | INTRAVENOUS | Status: DC
  Administered 2023-11-26 – 2023-11-28 (×9): 70 ug/kg/min via INTRAVENOUS
  Administered 2023-11-28: 80 ug/kg/min via INTRAVENOUS
  Administered 2023-11-28 (×3): 70 ug/kg/min via INTRAVENOUS
  Administered 2023-11-28 – 2023-11-29 (×6): 80 ug/kg/min via INTRAVENOUS
  Administered 2023-11-29: 60 ug/kg/min via INTRAVENOUS
  Administered 2023-11-30 (×4): 80 ug/kg/min via INTRAVENOUS
  Filled 2023-11-26 (×25): qty 100

## 2023-11-26 MED ORDER — FENTANYL CITRATE PF 50 MCG/ML IJ SOSY: 25.0000 ug | PREFILLED_SYRINGE | Freq: Once | INTRAMUSCULAR | Status: DC

## 2023-11-26 MED ORDER — ROCURONIUM BROMIDE 10 MG/ML (PF) SYRINGE
1.0000 mg/kg | PREFILLED_SYRINGE | INTRAVENOUS | Status: DC | PRN
  Administered 2023-11-26 (×2): 51.2 mg via INTRAVENOUS
  Filled 2023-11-26 (×2): qty 10

## 2023-11-26 MED ORDER — VITAL HIGH PROTEIN PO LIQD
1000.0000 mL | ORAL | Status: DC
Start: 2023-11-26 — End: 2023-11-28
  Administered 2023-11-26 – 2023-11-27 (×3): 1000 mL

## 2023-11-26 MED ORDER — FENTANYL 2500MCG IN NS 250ML (10MCG/ML) PREMIX INFUSION
50.0000 ug/h | INTRAVENOUS | Status: DC
  Administered 2023-11-26: 100 ug/h via INTRAVENOUS

## 2023-11-26 MED ORDER — MUPIROCIN 2 % EX OINT
1.0000 | TOPICAL_OINTMENT | Freq: Two times a day (BID) | CUTANEOUS | Status: AC
  Administered 2023-11-26 – 2023-11-30 (×10): 1 via NASAL
  Filled 2023-11-26 (×2): qty 22

## 2023-11-26 MED ORDER — SODIUM CHLORIDE 0.9 % IV SOLN
2.0000 g | Freq: Two times a day (BID) | INTRAVENOUS | Status: DC
  Administered 2023-11-26 – 2023-11-28 (×5): 2 g via INTRAVENOUS
  Filled 2023-11-26 (×5): qty 12.5

## 2023-11-26 MED ORDER — ARTIFICIAL TEARS OPHTHALMIC OINT
1.0000 | TOPICAL_OINTMENT | Freq: Three times a day (TID) | OPHTHALMIC | Status: DC
Start: 2023-11-26 — End: 2023-11-26
  Administered 2023-11-26: 1 via OPHTHALMIC
  Filled 2023-11-26: qty 3.5

## 2023-11-26 MED ORDER — FENTANYL 2500MCG IN NS 250ML (10MCG/ML) PREMIX INFUSION
25.0000 ug/h | INTRAVENOUS | Status: DC
  Administered 2023-11-26: 200 ug/h via INTRAVENOUS
  Administered 2023-11-27 – 2023-11-28 (×4): 250 ug/h via INTRAVENOUS
  Filled 2023-11-26 (×5): qty 250

## 2023-11-26 MED ORDER — VALPROIC ACID 250 MG/5ML PO SOLN
500.0000 mg | Freq: Two times a day (BID) | ORAL | Status: DC
  Administered 2023-11-26 – 2023-12-16 (×40): 500 mg
  Filled 2023-11-26 (×41): qty 10

## 2023-11-26 MED ORDER — SODIUM CHLORIDE 0.9 % IV SOLN
2.5000 mg/kg | Freq: Two times a day (BID) | INTRAVENOUS | Status: DC
  Filled 2023-11-26: qty 2.6

## 2023-11-26 MED ORDER — PROPOFOL 1000 MG/100ML IV EMUL
25.0000 ug/kg/min | INTRAVENOUS | Status: DC
  Administered 2023-11-26: 70 ug/kg/min via INTRAVENOUS

## 2023-11-26 MED ORDER — DOCUSATE SODIUM 50 MG/5ML PO LIQD
100.0000 mg | Freq: Two times a day (BID) | ORAL | Status: DC
  Administered 2023-11-26 – 2023-12-06 (×15): 100 mg
  Filled 2023-11-26 (×18): qty 10

## 2023-11-26 MED ORDER — SODIUM CHLORIDE 0.9 % IV SOLN
2.5000 mg/kg | Freq: Two times a day (BID) | INTRAVENOUS | Status: DC
  Administered 2023-11-27 – 2023-11-29 (×5): 125 mg via INTRAVENOUS
  Filled 2023-11-26 (×5): qty 2.5

## 2023-11-26 MED ORDER — FAMOTIDINE 20 MG PO TABS
20.0000 mg | ORAL_TABLET | Freq: Two times a day (BID) | ORAL | Status: DC
Start: 2023-11-26 — End: 2023-12-12
  Administered 2023-11-26 – 2023-12-11 (×33): 20 mg
  Filled 2023-11-26 (×35): qty 1

## 2023-11-26 MED ORDER — ORAL CARE MOUTH RINSE
15.0000 mL | OROMUCOSAL | Status: DC
  Administered 2023-11-26 – 2023-12-16 (×224): 15 mL via OROMUCOSAL

## 2023-11-26 MED ORDER — ROCURONIUM BROMIDE 10 MG/ML (PF) SYRINGE
PREFILLED_SYRINGE | INTRAVENOUS | Status: AC
  Administered 2023-11-26: 30 mg via INTRAVENOUS
  Filled 2023-11-26: qty 10

## 2023-11-26 MED ORDER — FENTANYL BOLUS VIA INFUSION
25.0000 ug | INTRAVENOUS | Status: DC | PRN
  Administered 2023-11-28 – 2023-11-29 (×6): 25 ug via INTRAVENOUS

## 2023-11-26 MED ORDER — FENTANYL CITRATE PF 50 MCG/ML IJ SOSY
25.0000 ug | PREFILLED_SYRINGE | Freq: Once | INTRAMUSCULAR | Status: AC
  Administered 2023-11-26: 50 ug via INTRAVENOUS
  Filled 2023-11-26: qty 1

## 2023-11-26 MED ORDER — INSULIN ASPART 100 UNIT/ML IJ SOLN
0.0000 [IU] | INTRAMUSCULAR | Status: DC
  Administered 2023-11-26: 2 [IU] via SUBCUTANEOUS
  Administered 2023-11-26: 3 [IU] via SUBCUTANEOUS
  Administered 2023-11-26 (×2): 1 [IU] via SUBCUTANEOUS
  Administered 2023-11-26: 2 [IU] via SUBCUTANEOUS
  Administered 2023-11-27 (×4): 1 [IU] via SUBCUTANEOUS
  Administered 2023-11-28: 2 [IU] via SUBCUTANEOUS
  Administered 2023-11-28: 1 [IU] via SUBCUTANEOUS
  Administered 2023-11-28 – 2023-11-29 (×6): 2 [IU] via SUBCUTANEOUS

## 2023-11-26 MED ORDER — CISATRACURIUM BOLUS VIA INFUSION
0.1000 mg/kg | Freq: Once | INTRAVENOUS | Status: DC
  Filled 2023-11-26: qty 5

## 2023-11-26 MED ORDER — ADULT MULTIVITAMIN W/MINERALS CH
1.0000 | ORAL_TABLET | Freq: Every day | ORAL | Status: DC
  Administered 2023-11-27 – 2023-11-28 (×2): 1
  Filled 2023-11-26 (×2): qty 1

## 2023-11-26 MED ORDER — AZITHROMYCIN 600 MG PO TABS
1200.0000 mg | ORAL_TABLET | ORAL | Status: DC
  Administered 2023-12-02 – 2023-12-09 (×2): 1200 mg
  Filled 2023-11-26 (×3): qty 2

## 2023-11-26 NOTE — Procedures (Addendum)
 Intubation Procedure Note  Cory Russell  [id number]  1991/05/29  Date:11/26/23  Time:12:25 AM   Provider Performing:Ossiel Marchio Volanda    Procedure: Intubation (31500)  Indication(s) Respiratory Failure  Consent Risks of the procedure as well as the alternatives and risks of each were explained to the patient and/or caregiver.  Consent for the procedure was obtained and is signed in the bedside chart   Anesthesia Etomidate , Versed , and Rocuronium    Time Out Verified patient identification, verified procedure, site/side was marked, verified correct patient position, special equipment/implants available, medications/allergies/relevant history reviewed, required imaging and test results available.   Sterile Technique Usual hand hygeine, masks, and gloves were used   Procedure Description Patient positioned in bed supine.  Sedation given as noted above.  Patient was intubated with endotracheal tube using Glidescope blade 3  View was Grade 1 full glottis .  Number of attempts was 1.  Colorimetric CO2 detector was consistent with tracheal placement.   Complications/Tolerance None; patient tolerated the procedure well. Chest X-ray ordered, ETT at 25cm originally, retracted by 3cm to 22cm.    EBL Minimal   Specimen(s) None

## 2023-11-26 NOTE — Progress Notes (Signed)
 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  Currently being treated for   # Possible PJP PNA with possible IRIS  # Intubated # Leukocytosis-sudden bump after initiation of IV steroid, unlikely new source of infection  -now intubated due to worsening respiratory status, prone  # Hyponatremia/hyperkalemia-in the setting of IV Bactrim , plan to continue as long as able to manage electrolyte abnormalities   # Low CMV viremia - unlikely to have an end organ disease, expected finding in poorly controlled HIV.  CMV 11k(up from 4k), Do not suspect end organ involvement likely.  . 6/14 LFTs normal, pt afebrile.   Recommendations -Continue IV Bactrim  as is with daily lokelma  PCCM managing IV steroids, concerns for IRIS  -As pt now intubated, will re-order pneumocystis smear. He was started on vanc+ cefepime due to c/f HAP. While Respiratory Cx pending(ordered) ok to continue broad spectrum abx -Noted to be too unstable for bronch a this time.  I would be helpful if BAL done to get path and Cx( PJP, [employer], fungal, bacterial) -Daily CBC and CMP -Standard isolation precautions    Microbiology:   Antibiotics: Bactrim   SUBJECTIVE: Prone intubated Interval: Afebrile overngi  Review of  Systems: ROS   Scheduled Meds:  artificial tears  1 Application Both Eyes Q8H   [START ON 12/02/2023] azithromycin   1,200 mg Per Tube Weekly   bictegravir-emtricitabine -tenofovir  AF  1 tablet Oral Daily   Chlorhexidine Gluconate Cloth  6 each Topical Daily   cisatracurium  0.1 mg/kg Intravenous Once   darunavir -cobicistat   1 tablet Oral Q breakfast   docusate  100 mg Per Tube BID   famotidine   20 mg Per Tube BID   feeding supplement (PROSource TF20)  20 mL Per Tube Daily   feeding supplement (VITAL HIGH PROTEIN)  1,000 mL Per Tube Q24H   fentaNYL  (SUBLIMAZE ) injection  25 mcg Intravenous Once   heparin injection (subcutaneous)  5,000 Units Subcutaneous Q8H   hydrocortisone    Rectal QID   insulin aspart  0-9 Units Subcutaneous Q4H   ipratropium  0.5 mg Nebulization Q6H   levalbuterol   1.25 mg Nebulization Q6H   lidocaine   1 patch Transdermal Q24H   methylPREDNISolone  (SOLU-MEDROL ) injection  125 mg Intravenous Q12H   [START ON 11/27/2023] multivitamin with minerals  1 tablet Per Tube Daily   mupirocin ointment  1 Application Nasal BID   mouth rinse  15 mL Mouth Rinse Q2H   polyethylene glycol  17 g Per Tube Daily   sodium zirconium cyclosilicate   10 g Per Tube TID   valproic  acid  500 mg Per Tube BID   vancomycin variable dose per unstable renal function (pharmacist dosing)   Does not apply See admin instructions   Continuous Infusions:  sodium chloride  Stopped (11/26/23 1723)  ceFEPime (MAXIPIME) IV Stopped (11/26/23 1003)   cisatracurium (NIMBEX) 200 mg in sodium chloride  0.9 % 100 mL (2 mg/mL) infusion 2.5 mcg/kg/min (11/26/23 1800)   fentaNYL  infusion INTRAVENOUS 200 mcg/hr (11/26/23 1800)   [START ON 11/27/2023] ganciclovir     norepinephrine (LEVOPHED) Adult infusion     propofol  (DIPRIVAN ) infusion 70 mcg/kg/min (11/26/23 1800)   sulfamethoxazole -trimethoprim  Stopped (11/26/23 1606)   PRN Meds:.acetaminophen , ALPRAZolam , alum & mag hydroxide-simeth, fentaNYL ,  ipratropium-albuterol , LORazepam , LORazepam , melatonin, mouth rinse, polyethylene glycol, sodium chloride  No Known Allergies  OBJECTIVE: Vitals:   11/26/23 1800 11/26/23 1830 11/26/23 1942 11/26/23 1949  BP: 110/62     Pulse: (!) 106 (!) 106    Resp: (!) 32 (!) 32    Temp:      TempSrc:      SpO2: 96% 97% 98% 97%  Weight:      Height:       Body mass index is 17.02 kg/m.  Physical Exam Constitutional:      Appearance: Normal appearance. He is normal weight. He is not toxic-appearing.     Comments: Prone intubation  HENT:     Head: Normocephalic and atraumatic.     Right Ear: External ear normal.     Left Ear: External ear normal.     Nose: No congestion or rhinorrhea.     Mouth/Throat:     Mouth: Mucous membranes are moist.     Pharynx: Oropharynx is clear.   Eyes:     Extraocular Movements: Extraocular movements intact.     Conjunctiva/sclera: Conjunctivae normal.     Pupils: Pupils are equal, round, and reactive to light.    Cardiovascular:     Rate and Rhythm: Normal rate and regular rhythm.     Heart sounds: No murmur heard.    No friction rub. No gallop.  Abdominal:     General: Abdomen is flat. Bowel sounds are normal.     Palpations: Abdomen is soft.   Musculoskeletal:        General: No swelling.     Cervical back: Normal range of motion and neck supple.   Skin:    General: Skin is warm and dry.   Psychiatric:        Mood and Affect: Mood normal.       Lab Results Lab Results  Component Value Date   WBC 18.0 (H) 11/26/2023   HGB 8.8 (L) 11/26/2023   HCT 26.0 (L) 11/26/2023   MCV 87.3 11/26/2023   PLT 298 11/26/2023    Lab Results  Component Value Date   CREATININE 1.31 (H) 11/26/2023   BUN 21 (H) 11/26/2023   NA 124 (L) 11/26/2023   K 5.7 (H) 11/26/2023   CL 87 (L) 11/26/2023   CO2 33 (H) 11/26/2023    Lab Results  Component Value Date   ALT 15 11/18/2023   AST 25 11/18/2023   ALKPHOS 57 11/18/2023   BILITOT 0.4 11/18/2023         Cory Stank, MD [facility] 11/26/2023, 10:24 PM I have personally spent 50 minutes involved in face-to-face and non-face-to-face activities for this patient on the day of the visit. Professional time spent includes the following activities: Preparing to see the patient (review of tests), Obtaining and/or reviewing separately obtained history (admission/discharge record), Performing a medically appropriate examination and/or evaluation , Ordering medications/tests/procedures, referring and communicating with other health care professionals, Documenting clinical information in the EMR, Independently interpreting results (not  separately reported), Communicating results to the patient/family/caregiver, Counseling and educating the patient/family/caregiver and Care coordination (not separately reported).

## 2023-11-26 NOTE — Progress Notes (Addendum)
 eLink Physician-Brief Progress Note Patient Name: Cory Russell DOB: 24-Nov-1990 MRN: [id number]   Date of Service  11/26/2023  HPI/Events of Note  Notified that patient had been proned. Has elevated peak pressures on vent (not new). Bedside had already adjusted ETT as well. Patient on 100 mic/holur of fentanyl  and 70 mic of propofol . O2 sat 95. ABG has just come back and he has resp acidosis but his po2 did improve.   eICU Interventions  Increase fentanyl  to 150 mic/hour Continue propofol  Is already at an optimal RASS at this point that if we need, he can have a paralytic infusion Roc has been ordered and will be given now Increase RR to 32 Follow repeat ABG at 7 am BP is ok without pressors VT is at 6 cc/kg IBW so cannot lower more given his ABG at this time  Will need to follow peak pressures and plateau pressures and see how he progresses At least for now is maintaining Ph > 7.2 with stable vital signs  DW Dr  Volanda      Intervention Category Major Interventions: Respiratory failure - evaluation and management  Sherrise Liberto G Consetta Cosner 11/26/2023, 5:30 AM

## 2023-11-26 NOTE — Progress Notes (Signed)
 Nutrition Follow-up  DOCUMENTATION CODES:   Severe malnutrition in context of chronic illness, Underweight  INTERVENTION:   -Continue Vital HP @ 10 ml/hr, advance by 10 ml every 8 hours to goal of 40 ml/hr. -60 ml Prosource TF 20 daily -Provides 1040 kcals, 104g protein and 802 ml H2O  NUTRITION DIAGNOSIS:   Severe Malnutrition related to chronic illness (HIV) as evidenced by severe muscle depletion, severe fat depletion.  Ongoing.  GOAL:   Patient will meet greater than or equal to 90% of their needs  Not meeting yet.  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  6/4: admitted  Patient previously on regular diet, was consuming 0-100% of meals, variable intakes.Was accepting Ensure. Now intubated as of this AM. Currently proned.  Patient is currently intubated on ventilator support MV: 12.3 L/min Temp (24hrs), Avg:98.5 F (36.9 C), Min:97.6 F (36.4 C), Max:99.1 F (37.3 C) MAP: 70 Propofol : 20.7 ml/hr ,provides 546 fat kcals  Admission weight: 108 lbs Current weight: 108 lbs  Medications: Nimbex, Colace, Pepcid , Miralax , Lokelma , sodium chloride   Labs reviewed: CBGs: 168-210 Low Na Elevated K  Elevated Phos TG 99  Diet Order:   Diet Order             Diet NPO time specified  Diet effective now                   EDUCATION NEEDS:   No education needs have been identified at this time  Skin:  Skin Assessment: Reviewed RN Assessment  Last BM:  6/20  Height:   Ht Readings from Last 1 Encounters:  11/08/23 5' 7 (1.702 m)    Weight:   Wt Readings from Last 1 Encounters:  11/26/23 49.3 kg     BMI:  Body mass index is 17.02 kg/m.  Estimated Nutritional Needs:   Kcal:  1800-2200  Protein:  100-115g  Fluid:  >1.8 L/day   Morna Lee, MS, RD, LDN Inpatient Clinical Dietitian Contact via Secure chat

## 2023-11-26 NOTE — Progress Notes (Signed)
 NAME:  Cory Russell, MRN:  [id number], DOB:  1990/06/30, LOS: 18 ADMISSION DATE:  11/08/2023, CONSULTATION DATE:  11/26/2023 REFERRING MD:  Gretta Leita SQUIBB, DO, CHIEF COMPLAINT:  respiratory failure   History of Present Illness:  Cory Russell is a 33 y.o. man with past medical history of HIV/AIDS, CD4<35 on admission, not on ARVs. Presents with seizure like activity 11 days ago and found to have sepsis from pneumonia.  He notes symptoms started about 2-3 weeks ago with shortness of breath and dry cough. He had two separate rounds of antibiotics visiting other urgent care and ED visits. He has tried albuterol  nebulizer treatments from friends and initially found them helpful.   CT Chest shows bilateral ground glass opacities concerning for pneumoncystis pneumonia. Since being here he has been Started on empiric antibiotics with bactrim  and steroids. Also resumed ARVs during this admission. Has been persistently hypoxemic on 10LNC. Pulmonary consulted to assist with evaluation and management and for bronchoscopy.   He feels persistently short of breath, worse with exertion. He feels like chest PT is helping him. Feeling more anxious. Also having difficulty taking pills.   Pertinent  Medical History  HIV, Seizure disorder  Significant Hospital Events: Including procedures, antibiotic start and stop dates in addition to other pertinent events   6/4-admit 6/15 pulmonary consult 6/18 transient desats with worsening chest x-ray.  Prednisone  changed to IV Solu-Medrol  6/22 worsening respiratory status requiring ICU transfer and intubation  Interim History / Subjective:  Proned on vent.   Objective    Blood pressure (!) 92/50, pulse (!) 111, temperature 97.6 F (36.4 C), temperature source Oral, resp. rate (!) 34, height 5' 7 (1.702 m), weight 49.3 kg, SpO2 95%.    Vent Mode: PRVC FiO2 (%):  [99 %-100 %] 100 % Set Rate:  [28 bmp-32 bmp] 32 bmp Vt Set:  [390 mL] 390 mL PEEP:  [8 cmH20-10  cmH20] 8 cmH20 Pressure Support:  [10 cmH20] 10 cmH20 Plateau Pressure:  [34 cmH20-44 cmH20] 34 cmH20   Intake/Output Summary (Last 24 hours) at 11/26/2023 0838 Last data filed at 11/26/2023 0600 Gross per 24 hour  Intake 1870.69 ml  Output 3275 ml  Net -1404.31 ml   Filed Weights   11/20/23 0500 11/22/23 0600 11/26/23 0315  Weight: 50.8 kg 51.2 kg 49.3 kg   General:  critically ill appearing man lying in bed in NAD, prone positioned HEENT: NCAT, eyes anicteric Neuro: RASS -5 CV: tachycardic, reg rhythm PULM:  dyssynchronous, rhales bilaterally. Pplat 33, DP 25 GI: nondistended Extremities: no edema, no cyanosis  7.22/93/137/38 Na+ 125 K+ 5.8 BUN 21 Cr 1.31 WBC 18 H/H 9.7/28.8 Platelets 298 A1c 6.4  Resolved problem list   Assessment and Plan  Acute hypoxemic & hypercapnic respiratory failure with ARDS Concern for superimposed HAP vs aspiration to explain his worsening several weeks into his hospitalization Severe Pneumocystis pneumonia Suspected IRIS -LTVV;4-8cc/kg IBW with goal Pplat <3- & DP <15. Not able to meet either goal despite permissive hypercapnia. Staying with high pressures but pH 7.2 and PCO2 90. -VAP prevention protocol -PAD protocol for sedation -starting nimbex for vent synchrony -prone 16 hrs/8 hrs off until P:F <150 -expand antibiotics to cover HAP organisms -too unstable for bronch currently; collect spuutm for fungus and routine culture as able -con't PJP treatment with bactrim  and steroids  Immunocompromised due to AIDS:  CD4<35 -con't biktarvy  this admission; also on Prezcobix  -gancyclovir prophylaxis -appreciate ID's management  Hyperkalemia due to Bactrim  Hyponatremia  -lokelma  -con't to trend -  hold salt tabs   AKI due to likely sepsis -maintain adequate perfusion -strict I/O -renally dose meds, avoid nephrotoxic meds  Chronic anemia, likely due to HIV, acute illness -transfuse for Hb <7 or hemodynamically significant  bleeding  Seizure Disorder -con't PTA depakote   Hyperglycemia; pre-DM. A1c 6/4 -check A1c -SSI PRN -goal BG 140-180  Severe protein energy malnutrition Cachexia -start trickle TF  Mother updated at bedside today. She understands how critically ill he is.     Best Practice Diet/type: TF DVT prophylaxis prophylactic heparin  Pressure ulcer(s): N/A GI prophylaxis: H2B Lines: arterial line Foley:  Yes, and it is still needed Code Status:  full code Last date of multidisciplinary goals of care discussion [Patient affirmed he wants full code and all life support options 6/22]   This patient is critically ill with multiple organ system failure which requires frequent high complexity decision making, assessment, support, evaluation, and titration of therapies. This was completed through the application of advanced monitoring technologies and extensive interpretation of multiple databases. During this encounter critical care time was devoted to patient care services described in this note for 55 minutes.  Leita SHAUNNA Gaskins, DO 11/26/23 2:23 PM  Pulmonary & Critical Care  For contact information, see Amion. If no response to pager, please call PCCM consult pager. After hours, 7PM- 7AM, please call Elink.

## 2023-11-26 NOTE — Progress Notes (Signed)
 Repeat ABG worse despite NMB & iNO. PCO>100. Increase VT 20cc to 410cc, MV now up ~2L. Repeat ABG in 2 hrs.  Cory SHAUNNA Gaskins, DO 11/26/23 9:36 AM  Pulmonary & Critical Care  For contact information, see Amion. If no response to pager, please call PCCM consult pager. After hours, 7PM- 7AM, please call Elink.

## 2023-11-26 NOTE — Progress Notes (Addendum)
 eLink Physician-Brief Progress Note Patient Name: Cory Russell DOB: Nov 21, 1990 MRN: [id number]   Date of Service  11/26/2023  HPI/Events of Note  Proned since 4 AM for first time per RN discussion.  Time to supine.  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 VS stable. Sats > 95%.  Last ABG from 15:20: Improved P/F ratio  and hypercarbia.  7.36/60/284   eICU Interventions  16 prone completed, to go for supine for 8 hrs, until P/F < 150.  Get ABG post supine.    - not on pressors. -continue ARDS net protocol.  - on VAP bundle.       Intervention Category Intermediate Interventions: Respiratory distress - evaluation and management  [name] 11/26/2023, 7:53 PM  22:56 ABG while supine is; 7.26/80/179/36/ on Fio2 70%, rate 32, PEEP 8, VT 410. P peak at 30.  - continue permissive hypercapnia. paO2 down to 179 from earlier 284 while proning, suggestive responsiveness to prone position , improving v/Q mismatch. - follow ABG at 2 AM again.   02:47 Repeat ABG, P/F is stable. Continue supine ventilation.   05:14  Hyperkalemia at 6.4, persistently elevated since 22 nd. Cr 1.27, which is improving. - hyperkalemia treatment ordered. Discussed with RN. Camera re evaluation done also. This level is post 10 mg at 10 PM.  Hyperkalemia is mostly from bactrim /anti retroviral meds. - making food urine. Rx: Hyperkalemia protocol.  Consider nephrology help .

## 2023-11-26 NOTE — Progress Notes (Addendum)
 Pharmacy Antibiotic Note  Cory Russell is a 33 y.o. male admitted on 11/08/2023 with pneumonia.  Pharmacy has been consulted for vancomycin and cefepime dosing.  Transfer to ICU given worsening respiratory status. WBC trending up to 18 (on steroids), LA 2.1 overnight now down-trending to 1.5. Afebrile. Started on ganciclovir for possible CMV overnight - with Scr increasing will adjust ganciclovir dose from previous order. Also on bactrim  for PJP. ID following. CCM today wanting to add cefepime/vanc - already on Bactrim  for PJP and MRSA PCR positive yesterday - with MRSA susceptibly ~57% will use vancomycin until ID evaluates.   Plan: Adjust ganciclovir to 2.5 mg/kg every 12 hours given CrCl 50-69 mL/min Order vancomycin 1g IV once then dose by level given Scr trending upward Cefepime 2g IV every 12 hours Monitor renal fx, cx results, clinical pic, and vanc levels as appropriate  Height: 5' 7 (170.2 cm) Weight: 49.3 kg (108 lb 11 oz) IBW/kg (Calculated) : 66.1  Temp (24hrs), Avg:98.5 F (36.9 C), Min:97.6 F (36.4 C), Max:99.1 F (37.3 C)  Recent Labs  Lab 11/22/23 0353 11/23/23 0357 11/24/23 0355 11/25/23 0605 11/25/23 2340 11/26/23 0518 11/26/23 0650  WBC 9.6  --  20.9* 17.5* 16.6* 18.0*  --   CREATININE 1.19 1.00 1.05 0.96 0.89 1.31*  --   LATICACIDVEN  --   --   --   --  2.1*  --  1.5    Estimated Creatinine Clearance: 55.9 mL/min (A) (by C-G formula based on SCr of 1.31 mg/dL (H)).    No Known Allergies  Antimicrobials this admission: Unasyn  6/4>>6/5 Ceftriaxone  6/5 x1 Doxycycline  6/8 >> 6/11 Clindamycin  6/16>>6/18 Azithromycin  6/14 q wk >>  Ganciclovir 6/22>> Cefepime 6/22 >>  Vanc 6/22 >>  Bactrim  6/4 >>   Dose adjustments this admission: N/A  Microbiology results: 6/21 Trach aspirate: ordered 6/21 MRSA PCR: positive  6/13 Sputum cx: norm flora 6/4 Bcx: ngtd  6/4 Resp PCR: neg   Thank you for allowing pharmacy to participate in this patient's  care,  Suzen Sour, PharmD, BCCCP Clinical Pharmacist  Phone: [phone] 11/26/2023 9:50 AM  Please check AMION for all [facility] phone numbers After 10:00 PM, call Main Pharmacy [phone]

## 2023-11-26 NOTE — Progress Notes (Signed)
 Pt remains in prone position. Head turn performed with RN. Pt's head now facing toward the R.

## 2023-11-26 NOTE — Progress Notes (Signed)
 Pharmacy Antimicrobial Note  Cory Russell is a 33 y.o. 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  Pharmacy has been consulted for ganciclovir dosing.  CMV DNA 11,800.  Plan: Ganciclovir 5mg /kg IV Q12H.  Plan for induction therapy x3-6 weeks, then maintenance with valganciclovir (may consider valganciclovir to complete induction after initial IV therapy). Also on ABX for PJP PNA.  Height: 5' 7 (170.2 cm) Weight: 51.2 kg (112 lb 14 oz) IBW/kg (Calculated) : 66.1  Temp (24hrs), Avg:98.3 F (36.8 C), Min:98.2 F (36.8 C), Max:98.4 F (36.9 C)  Recent Labs  Lab 11/21/23 0500 11/22/23 0353 11/23/23 0357 11/24/23 0355 11/25/23 0605  WBC 9.1 9.6  --  20.9* 17.5*  CREATININE 1.05 1.19 1.00 1.05 0.96    Estimated Creatinine Clearance: 79.3 mL/min (by C-G formula based on SCr of 0.96 mg/dL).    No Known Allergies  Thank you for allowing pharmacy to be a part of this patient's care.  Marvetta Dauphin, PharmD, BCPS  11/26/2023 12:08 AM

## 2023-11-26 NOTE — Procedures (Signed)
 Arterial Catheter Insertion Procedure Note  Cory Russell  [id number]  [birth year]  Date:11/26/23  Time:5:04 AM    Provider Performing: Kevin Legions    Procedure: Insertion of Arterial Line (63379) with US  guidance (23062)   Indication(s) Blood pressure monitoring and/or need for frequent ABGs  Consent Risks of the procedure as well as the alternatives and risks of each were explained to the patient and/or caregiver.  Consent for the procedure was obtained and is signed in the bedside chart  Anesthesia None   Time Out Verified patient identification, verified procedure, site/side was marked, verified correct patient position, special equipment/implants available, medications/allergies/relevant history reviewed, required imaging and test results available.   Sterile Technique Maximal sterile technique including full sterile barrier drape, hand hygiene, sterile gown, sterile gloves, mask, hair covering, sterile ultrasound probe cover (if used).   Procedure Description Area of catheter insertion was cleaned with chlorhexidine and draped in sterile fashion. With real-time ultrasound guidance an arterial catheter was placed into the right radial artery.  Appropriate arterial tracings confirmed on monitor.     Complications/Tolerance None; patient tolerated the procedure well.   EBL Minimal   Specimen(s) None

## 2023-11-26 NOTE — Progress Notes (Signed)
 Patient placed into prone position per MD order. RTx2 RNx2 at bedside, patient tolerated turn well with stable vitals.

## 2023-11-26 NOTE — Progress Notes (Signed)
 eLink Physician-Brief Progress Note Patient Name: Cory Russell DOB: [birth year] MRN: [id number]   Date of Service  11/26/2023  HPI/Events of Note  Patient intubated in ICU and is on 100%/peep 8, RR 28 and VT 390. Is on propofol   infusion  but with asynchrony on ventilator.   eICU Interventions  Add fentanyl  drip with pushes     Intervention Category Major Interventions: Respiratory failure - evaluation and management Evaluation Type: New Patient Evaluation  [name] 11/26/2023, 1:51 AM

## 2023-11-26 NOTE — Progress Notes (Signed)
 NAME:  Cory Russell, MRN:  [id number], DOB:  27-May-1991, LOS: 18 ADMISSION DATE:  11/08/2023, CONSULTATION DATE:  11/26/2023 REFERRING MD:  Willette Adriana LABOR, MD, CHIEF COMPLAINT:  respiratory failure   History of Present Illness:  Cory Russell is a 33 y.o. man with past medical history of HIV/AIDS, CD4<35 on admission, not on ARVs. Presents with seizure like activity 11 days ago and found to have sepsis from pneumonia.  He notes symptoms started about 2-3 weeks ago with shortness of breath and dry cough. He had two separate rounds of antibiotics visiting other urgent care and ED visits. He has tried albuterol  nebulizer treatments from friends and initially found them helpful.   CT Chest shows bilateral ground glass opacities concerning for pneumoncystis pneumonia. Since being here he has been Started on empiric antibiotics with bactrim  and steroids. Also resumed ARVs during this admission. Has been persistently hypoxemic on 10LNC. Pulmonary consulted to assist with evaluation and management and for bronchoscopy.   He feels persistently short of breath, worse with exertion. He feels like chest PT is helping him. Feeling more anxious. Also having difficulty taking pills.   Pertinent  Medical History  HIV, Seizure disorder  Significant Hospital Events: Including procedures, antibiotic start and stop dates in addition to other pertinent events   6/4-admit 6/15 pulmonary consult 6/18 transient desats with worsening chest x-ray.  Prednisone  changed to IV Solu-Medrol  6/22 worsening respiratory status requiring ICU transfer and intubation  Interim History / Subjective:  Pt became more hypoxic and tachypneic on HFNC and then Bipap, transferred to the ICU and intubated    Objective    Blood pressure (!) 127/91, pulse (!) 101, temperature 98.2 F (36.8 C), temperature source Oral, resp. rate (!) 43, height 5' 7 (1.702 m), weight 51.2 kg, SpO2 92%.    FiO2 (%):  [95 %-100 %] 100 % PEEP:  [10  cmH20] 10 cmH20 Pressure Support:  [10 cmH20] 10 cmH20   Intake/Output Summary (Last 24 hours) at 11/26/2023 0008 Last data filed at 11/25/2023 2239 Gross per 24 hour  Intake 1305.23 ml  Output 1925 ml  Net -619.77 ml   Filed Weights   11/19/23 0336 11/20/23 0500 11/22/23 0600  Weight: 55.8 kg 50.8 kg 51.2 kg   General:  thin, poorly nourished M in severe respiratory distress HEENT: MM pink/moist, bipap mask Neuro: alert and oriented, anxious CV: s1s2 rrr, no m/r/g PULM:  poor air entry bilaterally with tachypnea and accessory muscle use  GI: soft, non-disteded  Extremities: warm/dry, no edema  Skin: no rashes or lesions   Lab/imaging reviewed Chest xray without pneumo  Resolved problem list   Assessment and Plan  Acute hypoxemic respiratory failure Severe Pneumocystis pneumonia AIDS CD4<35, resumed biktarvy  this admission Suspected IRIS Anxiety -worsening respiratory failure requiring ICU transfer and intubation -Continue Bactrim , solumedrol, ID following -low p/f ratio after intubation, decision made to prone, follow repeat ABG -on propofol  and fentanyl , paralytic for vent synchrony -gancyclovir for CMV --Maintain full vent support with SAT/SBT as tolerated -titrate Vent setting to maintain SpO2 greater than or equal to 90%. -HOB elevated 30 degrees. -Plateau pressures less than 30 cm H20.  -Follow chest x-ray, ABG prn.   -Bronchial hygiene and RT/bronchodilator protocol.   Hyperkalemia  Hyponatremia  -given Lokelma , improved, continue to trend  -continue salt tabs  Seizure Disorder -continue depakote   -MRI brain done 6/4 without acute findings    Hyperglycemia -SSI, A1c   Best Practice Diet/type: NPO DVT prophylaxis prophylactic heparin  Pressure  ulcer(s): N/A GI prophylaxis: H2B Lines: arterial line Foley:  Yes, and it is still needed Code Status:  full code Last date of multidisciplinary goals of care discussion [Patient affirmed he wants full  code and all life support options 6/22]  CRITICAL CARE Performed by: Leita SAUNDERS Vashti Bolanos   Total critical care time: 70 minutes  Critical care time was exclusive of separately billable procedures and treating other patients.  Critical care was necessary to treat or prevent imminent or life-threatening deterioration.  Critical care was time spent personally by me on the following activities: development of treatment plan with patient and/or surrogate as well as nursing, discussions with consultants, evaluation of patient's response to treatment, examination of patient, obtaining history from patient or surrogate, ordering and performing treatments and interventions, ordering and review of laboratory studies, ordering and review of radiographic studies, pulse oximetry and re-evaluation of patient's condition.    Leita SAUNDERS Galvin Aversa, PA-C [facility] See Amion for pager If no response to pager , please call [phone] until 7pm After 7:00 pm call Elink  [phone]

## 2023-11-26 NOTE — Progress Notes (Signed)
 Patient repositioned supine per MD order by RT, RNx2 and nurse tech. Patient tolerated repositioning well with stable vitals throughout turn, with oxygen saturations at 98%. Tube still taped in adequate position at 22 at the lip.

## 2023-11-27 ENCOUNTER — Inpatient Hospital Stay (HOSPITAL_COMMUNITY)

## 2023-11-27 DIAGNOSIS — J80 Acute respiratory distress syndrome: Secondary | ICD-10-CM | POA: Diagnosis not present

## 2023-11-27 DIAGNOSIS — E875 Hyperkalemia: Secondary | ICD-10-CM | POA: Diagnosis not present

## 2023-11-27 DIAGNOSIS — J939 Pneumothorax, unspecified: Secondary | ICD-10-CM

## 2023-11-27 DIAGNOSIS — R609 Edema, unspecified: Secondary | ICD-10-CM

## 2023-11-27 DIAGNOSIS — I2602 Saddle embolus of pulmonary artery with acute cor pulmonale: Secondary | ICD-10-CM

## 2023-11-27 DIAGNOSIS — E871 Hypo-osmolality and hyponatremia: Secondary | ICD-10-CM | POA: Diagnosis not present

## 2023-11-27 DIAGNOSIS — J159 Unspecified bacterial pneumonia: Secondary | ICD-10-CM

## 2023-11-27 DIAGNOSIS — E43 Unspecified severe protein-calorie malnutrition: Secondary | ICD-10-CM | POA: Diagnosis not present

## 2023-11-27 DIAGNOSIS — N179 Acute kidney failure, unspecified: Secondary | ICD-10-CM | POA: Diagnosis not present

## 2023-11-27 DIAGNOSIS — B20 Human immunodeficiency virus [HIV] disease: Secondary | ICD-10-CM | POA: Diagnosis not present

## 2023-11-27 DIAGNOSIS — B59 Pneumocystosis: Secondary | ICD-10-CM | POA: Diagnosis not present

## 2023-11-27 LAB — GLUCOSE, CAPILLARY
Glucose-Capillary: 134 mg/dL — ABNORMAL HIGH (ref 70–99)
Glucose-Capillary: 115 mg/dL — ABNORMAL HIGH (ref 70–99)
Glucose-Capillary: 140 mg/dL — ABNORMAL HIGH (ref 70–99)
Glucose-Capillary: 146 mg/dL — ABNORMAL HIGH (ref 70–99)
Glucose-Capillary: 147 mg/dL — ABNORMAL HIGH (ref 70–99)
Glucose-Capillary: 189 mg/dL — ABNORMAL HIGH (ref 70–99)

## 2023-11-27 LAB — POCT I-STAT 7, (LYTES, BLD GAS, ICA,H+H)
Acid-Base Excess: 6 mmol/L — ABNORMAL HIGH (ref 0.0–2.0)
Acid-Base Excess: 7 mmol/L — ABNORMAL HIGH (ref 0.0–2.0)
Acid-Base Excess: 10 mmol/L — ABNORMAL HIGH (ref 0.0–2.0)
Bicarbonate: 34.7 mmol/L — ABNORMAL HIGH (ref 20.0–28.0)
Bicarbonate: 36.2 mmol/L — ABNORMAL HIGH (ref 20.0–28.0)
Bicarbonate: 39 mmol/L — ABNORMAL HIGH (ref 20.0–28.0)
Calcium, Ion: 1.21 mmol/L (ref 1.15–1.40)
Calcium, Ion: 1.22 mmol/L (ref 1.15–1.40)
Calcium, Ion: 1.27 mmol/L (ref 1.15–1.40)
HCT: 26 % — ABNORMAL LOW (ref 39.0–52.0)
HCT: 25 % — ABNORMAL LOW (ref 39.0–52.0)
HCT: 25 % — ABNORMAL LOW (ref 39.0–52.0)
Hemoglobin: 8.8 g/dL — ABNORMAL LOW (ref 13.0–17.0)
Hemoglobin: 8.5 g/dL — ABNORMAL LOW (ref 13.0–17.0)
Hemoglobin: 8.5 g/dL — ABNORMAL LOW (ref 13.0–17.0)
O2 Saturation: 99 %
O2 Saturation: 99 %
O2 Saturation: 99 %
Patient temperature: 98.2
Potassium: 6.1 mmol/L — ABNORMAL HIGH (ref 3.5–5.1)
Potassium: 5.9 mmol/L — ABNORMAL HIGH (ref 3.5–5.1)
Potassium: 6.3 mmol/L (ref 3.5–5.1)
Sodium: 126 mmol/L — ABNORMAL LOW (ref 135–145)
Sodium: 126 mmol/L — ABNORMAL LOW (ref 135–145)
Sodium: 128 mmol/L — ABNORMAL LOW (ref 135–145)
TCO2: 37 mmol/L — ABNORMAL HIGH (ref 22–32)
TCO2: 39 mmol/L — ABNORMAL HIGH (ref 22–32)
TCO2: 42 mmol/L — ABNORMAL HIGH (ref 22–32)
pCO2 arterial: 79.8 mmHg (ref 32–48)
pCO2 arterial: 85.1 mmHg (ref 32–48)
pCO2 arterial: 87.9 mmHg (ref 32–48)
pH, Arterial: 7.246 — ABNORMAL LOW (ref 7.35–7.45)
pH, Arterial: 7.237 — ABNORMAL LOW (ref 7.35–7.45)
pH, Arterial: 7.255 — ABNORMAL LOW (ref 7.35–7.45)
pO2, Arterial: 168 mmHg — ABNORMAL HIGH (ref 83–108)
pO2, Arterial: 173 mmHg — ABNORMAL HIGH (ref 83–108)
pO2, Arterial: 185 mmHg — ABNORMAL HIGH (ref 83–108)

## 2023-11-27 LAB — ECHOCARDIOGRAM COMPLETE
Area-P 1/2: 5.13 cm2
Calc EF: 70.9 %
Height: 67 in
S' Lateral: 2.6 cm
Single Plane A2C EF: 72 %
Single Plane A4C EF: 70.9 %
Weight: 1728.41 [oz_av]

## 2023-11-27 LAB — CBC
HCT: 25.7 % — ABNORMAL LOW (ref 39.0–52.0)
Hemoglobin: 8.2 g/dL — ABNORMAL LOW (ref 13.0–17.0)
MCH: 28.4 pg (ref 26.0–34.0)
MCHC: 31.9 g/dL (ref 30.0–36.0)
MCV: 88.9 fL (ref 80.0–100.0)
Platelets: 237 10*3/uL (ref 150–400)
RBC: 2.89 MIL/uL — ABNORMAL LOW (ref 4.22–5.81)
RDW: 16.5 % — ABNORMAL HIGH (ref 11.5–15.5)
WBC: 10.9 10*3/uL — ABNORMAL HIGH (ref 4.0–10.5)
nRBC: 0 % (ref 0.0–0.2)

## 2023-11-27 LAB — PHOSPHORUS: Phosphorus: 3.9 mg/dL (ref 2.5–4.6)

## 2023-11-27 LAB — BASIC METABOLIC PANEL WITH GFR
Anion gap: 9 (ref 5–15)
BUN: 24 mg/dL — ABNORMAL HIGH (ref 6–20)
CO2: 29 mmol/L (ref 22–32)
Calcium: 8.4 mg/dL — ABNORMAL LOW (ref 8.9–10.3)
Chloride: 89 mmol/L — ABNORMAL LOW (ref 98–111)
Creatinine, Ser: 1.27 mg/dL — ABNORMAL HIGH (ref 0.61–1.24)
GFR, Estimated: >60 mL/min (ref >60)
Glucose, Bld: 135 mg/dL — ABNORMAL HIGH (ref 70–99)
Potassium: 6.4 mmol/L (ref 3.5–5.1)
Sodium: 127 mmol/L — ABNORMAL LOW (ref 135–145)

## 2023-11-27 LAB — POTASSIUM
Potassium: 4.5 mmol/L (ref 3.5–5.1)
Potassium: 6.3 mmol/L (ref 3.5–5.1)
Potassium: 6.3 mmol/L (ref 3.5–5.1)

## 2023-11-27 LAB — AMMONIA: Ammonia: 67 umol/L — ABNORMAL HIGH (ref 9–35)

## 2023-11-27 LAB — TRIGLYCERIDES: Triglycerides: 91 mg/dL (ref <150)

## 2023-11-27 LAB — MAGNESIUM: Magnesium: 3 mg/dL — ABNORMAL HIGH (ref 1.7–2.4)

## 2023-11-27 MED ORDER — DEXTROSE 50 % IV SOLN
1.0000 | Freq: Once | INTRAVENOUS | Status: AC
  Administered 2023-11-27: 50 mL via INTRAVENOUS
  Filled 2023-11-27: qty 50

## 2023-11-27 MED ORDER — CALCIUM GLUCONATE-NACL 2-0.675 GM/100ML-% IV SOLN
2.0000 g | Freq: Once | INTRAVENOUS | Status: AC
  Administered 2023-11-27: 2000 mg via INTRAVENOUS
  Filled 2023-11-27: qty 100

## 2023-11-27 MED ORDER — SODIUM ZIRCONIUM CYCLOSILICATE 10 G PO PACK: 10.0000 g | PACK | Freq: Two times a day (BID) | ORAL | Status: DC

## 2023-11-27 MED ORDER — CLINDAMYCIN PHOSPHATE 600 MG/50ML IV SOLN
600.0000 mg | Freq: Four times a day (QID) | INTRAVENOUS | Status: AC
  Administered 2023-11-27 – 2023-11-28 (×6): 600 mg via INTRAVENOUS
  Filled 2023-11-27 (×6): qty 50

## 2023-11-27 MED ORDER — SODIUM BICARBONATE 8.4 % IV SOLN
50.0000 meq | Freq: Once | INTRAVENOUS | Status: AC
  Administered 2023-11-27: 50 meq via INTRAVENOUS
  Filled 2023-11-27: qty 50

## 2023-11-27 MED ORDER — INSULIN ASPART 100 UNIT/ML IV SOLN
10.0000 [IU] | Freq: Once | INTRAVENOUS | Status: AC
  Administered 2023-11-27: 10 [IU] via INTRAVENOUS

## 2023-11-27 MED ORDER — SODIUM ZIRCONIUM CYCLOSILICATE 10 G PO PACK
10.0000 g | PACK | Freq: Once | ORAL | Status: AC
  Administered 2023-11-27: 10 g
  Filled 2023-11-27: qty 1

## 2023-11-27 MED ORDER — ARFORMOTEROL TARTRATE 15 MCG/2ML IN NEBU
15.0000 ug | INHALATION_SOLUTION | Freq: Two times a day (BID) | RESPIRATORY_TRACT | Status: DC
  Administered 2023-11-27 – 2023-12-16 (×38): 15 ug via RESPIRATORY_TRACT
  Filled 2023-11-27 (×41): qty 2

## 2023-11-27 MED ORDER — SODIUM ZIRCONIUM CYCLOSILICATE 10 G PO PACK
10.0000 g | PACK | ORAL | Status: AC
  Administered 2023-11-27 (×2): 10 g
  Filled 2023-11-27 (×2): qty 1

## 2023-11-27 MED ORDER — PRIMAQUINE PHOSPHATE 26.3 (15 BASE) MG PO TABS
30.0000 mg | ORAL_TABLET | Freq: Every day | ORAL | Status: AC
  Administered 2023-11-27 – 2023-11-28 (×2): 30 mg
  Filled 2023-11-27 (×2): qty 2

## 2023-11-27 MED ORDER — REVEFENACIN 175 MCG/3ML IN SOLN
175.0000 ug | Freq: Every day | RESPIRATORY_TRACT | Status: DC
  Administered 2023-11-28 – 2023-12-16 (×19): 175 ug via RESPIRATORY_TRACT
  Filled 2023-11-27 (×20): qty 3

## 2023-11-27 MED ORDER — CALCIUM GLUCONATE-NACL 1-0.675 GM/50ML-% IV SOLN
1.0000 g | Freq: Once | INTRAVENOUS | Status: AC
  Administered 2023-11-27: 1000 mg via INTRAVENOUS
  Filled 2023-11-27: qty 50

## 2023-11-27 MED ORDER — SODIUM ZIRCONIUM CYCLOSILICATE 10 G PO PACK
10.0000 g | PACK | ORAL | Status: AC
  Administered 2023-11-27 – 2023-11-28 (×2): 10 g
  Filled 2023-11-27 (×2): qty 1

## 2023-11-27 MED ORDER — LINEZOLID 600 MG PO TABS
600.0000 mg | ORAL_TABLET | Freq: Two times a day (BID) | ORAL | Status: AC
  Administered 2023-11-27 – 2023-12-02 (×12): 600 mg
  Filled 2023-11-27 (×12): qty 1

## 2023-11-27 MED ORDER — ALBUTEROL SULFATE (2.5 MG/3ML) 0.083% IN NEBU
10.0000 mg | INHALATION_SOLUTION | Freq: Once | RESPIRATORY_TRACT | Status: AC
  Administered 2023-11-27: 10 mg via RESPIRATORY_TRACT
  Filled 2023-11-27: qty 12

## 2023-11-27 NOTE — Plan of Care (Signed)
  Problem: Education: Goal: Knowledge of General Education information will improve Description: Including pain rating scale, medication(s)/side effects and non-pharmacologic comfort measures Outcome: Not Applicable   Problem: Health Behavior/Discharge Planning: Goal: Ability to manage health-related needs will improve Outcome: Not Applicable   Problem: Clinical Measurements: Goal: Ability to maintain clinical measurements within normal limits will improve Outcome: Not Progressing Goal: Will remain free from infection Outcome: Not Progressing Goal: Diagnostic test results will improve Outcome: Not Progressing Goal: Respiratory complications will improve Outcome: Not Progressing Goal: Cardiovascular complication will be avoided Outcome: Progressing   Problem: Activity: Goal: Risk for activity intolerance will decrease Outcome: Not Progressing   Problem: Nutrition: Goal: Adequate nutrition will be maintained Outcome: Progressing   Problem: Coping: Goal: Level of anxiety will decrease Outcome: Progressing   Problem: Elimination: Goal: Will not experience complications related to bowel motility Outcome: Not Progressing Goal: Will not experience complications related to urinary retention Outcome: Progressing   Problem: Pain Managment: Goal: General experience of comfort will improve and/or be controlled Outcome: Progressing   Problem: Safety: Goal: Ability to remain free from injury will improve Outcome: Progressing   Problem: Skin Integrity: Goal: Risk for impaired skin integrity will decrease Outcome: Progressing   Problem: Activity: Goal: Ability to tolerate increased activity will improve Outcome: Progressing   Problem: Respiratory: Goal: Ability to maintain a clear airway and adequate ventilation will improve Outcome: Progressing   Problem: Role Relationship: Goal: Method of communication will improve Outcome: Not Applicable   Problem: Education: Goal:  Ability to describe self-care measures that may prevent or decrease complications (Diabetes Survival Skills Education) will improve Outcome: Not Applicable Goal: Individualized Educational Video(s) Outcome: Not Applicable   Problem: Coping: Goal: Ability to adjust to condition or change in health will improve Outcome: Progressing   Problem: Fluid Volume: Goal: Ability to maintain a balanced intake and output will improve Outcome: Progressing   Problem: Health Behavior/Discharge Planning: Goal: Ability to identify and utilize available resources and services will improve Outcome: Not Applicable Goal: Ability to manage health-related needs will improve Outcome: Not Applicable   Problem: Metabolic: Goal: Ability to maintain appropriate glucose levels will improve Outcome: Progressing   Problem: Nutritional: Goal: Maintenance of adequate nutrition will improve Outcome: Progressing Goal: Progress toward achieving an optimal weight will improve Outcome: Progressing   Problem: Skin Integrity: Goal: Risk for impaired skin integrity will decrease Outcome: Progressing   Problem: Tissue Perfusion: Goal: Adequacy of tissue perfusion will improve Outcome: Progressing

## 2023-11-27 NOTE — Procedures (Signed)
 Cortrak  Person Inserting Tube:  Katrinka Millman D, RD Tube Type:  Cortrak - 43 inches Tube Size:  10 Tube Location:  Left nare Initial Placement:  Stomach Secured by: Bridle Technique Used to Measure Tube Placement:  Marking at nare/corner of mouth Cortrak Secured At:  73 cm Procedure Comments:  Cortrak Tube Team Note:  Consult received to place a Cortrak feeding tube.   No x-ray is required. RN may begin using tube.   If the tube becomes dislodged please keep the tube and contact the Cortrak team at [email or web address] for replacement.  If after hours and replacement cannot be delayed, place a NG tube and confirm placement with an abdominal x-ray.    Millman Katrinka, RD, LDN, CNSC Registered Dietitian II Please reach out via secure chat

## 2023-11-27 NOTE — Progress Notes (Signed)
 Pharmacy: Antimicrobial Stewardship Note  [name] with HIV - noncompliant with ART regimen - CD4 <35 on 6/4 currently being treated for presumed PJP PNA given low CD4 count and fungitell >500.   The patient was started on PJP treatment on 6/4 and is currently on D#20 of treatment.   The patient was transitioned to clindamycin  + primaquine  briefly on 6/16 in the setting of hyperkalemia of 6.1 (partial hemolysis noted) - resolved and transitioned back to SMX/TMP on 6/18.   Noted K up over the weekend despite attempts to manage with lokelma  with notable bump up in SCr. K has remained in high 5's - low 6's. Will switch back to Clindamycin  + primaquine  today.  In regards to ART - okay to continue Biktarvy  + Prezcobix  PT.   Ganciclovir started over the weekend by CCM for elevated CMV levels - per Dr. Dea, to continue for now  Will transition Vancomycin to Linezolid PT, continue Cefepime for HAP coverage pending cultures.  Plan - D/c sulfamethoxazole /trimethoprim  - Adjust to Clindamycin  600 mg IV every 6 hours + primaquine  30 mg daily PT - D/c Vancomycin and switch to linezolid 600 mg PT bid - Continue Cefepime 2g IV every 12 hours - Continue Ganciclovir 125 mg IV every 12 hours - Continue Biktarvy  + Prezcobix  daily PT - Will monitor progression, renal function, culture/testing updates  Thank you for allowing pharmacy to be a part of this patient's care.  Almarie Lunger, PharmD, BCPS, BCIDP Infectious Diseases Clinical Pharmacist 11/27/2023 8:10 AM   **Pharmacist phone directory can now be found on [email or web address] ([email or web address]).  Listed under [facility].

## 2023-11-27 NOTE — Progress Notes (Signed)
 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  Started on paralytics as well as broadened antibiotic coverage for HAP   Assessment 33 year old male with prior history of HIV/AIDS, noncompliance with medications, epilepsy admitted with subacute cough and dyspnea, also seizure.  Currently being treated for possible severe PJP PNA with concerns for IRIS, in the setting of rapid decline of viral load with initiation of ART.   Patient had severe worsening of respite status on 6/22 requiring ICU transfer and intubation.   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 No visual, neurologic or GI symptoms prior to intubation. Uncommon to have CMV pneumonitis and will need biopsy for definitive diagnosis  # Superficial vein thrombosis of left cephalic vein   Recommendations - Will switch IV Bactrim  to primaquine  and clindamycin  due to  persistent hyperkalemia - PCCM managing IV steroids, concerns for IRIS  - Will switch IV Vancomycin to IV linezolid and continue cefepime pending cultures - Daily CBC and BMP - Fu blastomyces ag and histoplasma ag in urine ordered( not collected) - D/w PCCM for bronch, BAL and possible bronchial biopsy to r/o end organ pulmonary disease, high risk for biopsy. Fu Fungal, AFB, aerobic/anaerobic cx, CMV viral load, PCP stain. Will add on Viral cx - Standard isolation precautions  Thank you for the consult. Please page with pertinent questions or concerns.  ______________________________________________________________________ Subjective patient seen and examined at the bedside. Intubated and sedated/paralyzed   Vitals BP 104/60 (BP Location: Right Arm)   Pulse (!) 108   Temp 99 F (37.2 C) (Oral)   Resp (!) 32   Ht 5' 7 (1.702 m)   Wt 49 kg   SpO2 98%   BMI 16.92 kg/m     Physical Exam Constitutional:  adult male orotracheally intubated    Comments: On fentanyl , propofol , nimbex  Cardiovascular:     Rate and Rhythm: Normal rate and regular rhythm.     Heart sounds:   Pulmonary:     Effort: Pulmonary effort normal; on vent    Comments: coarse mechanical breath sounds b/l   Abdominal:     Palpations: Abdomen is non distended    Tenderness:   Musculoskeletal:        General: No swelling or tenderness in peripheral joints   Skin:    Comments: no rashes   Neurological:     General: sedated, pupils bilaterally symmetrical  Pertinent Microbiology Results for orders placed or performed during the hospital encounter of 11/08/23  Culture, blood (Routine X 2) w Reflex  to ID Panel     Status: None   Collection Time: 11/08/23  4:18 AM   Specimen: BLOOD  Result Value Ref Range Status   Specimen Description BLOOD BLOOD RIGHT ARM  Final   Special Requests   Final    BOTTLES DRAWN AEROBIC AND ANAEROBIC Blood Culture adequate volume   Culture   Final    NO GROWTH 5  DAYS Performed at [facility], [address], [place], [place] [address]    Report Status 11/13/2023 FINAL  Final  Culture, blood (Routine X 2) w Reflex to ID Panel     Status: None   Collection Time: 11/08/23  4:22 AM   Specimen: BLOOD  Result Value Ref Range Status   Specimen Description BLOOD BLOOD RIGHT ARM  Final   Special Requests   Final    BOTTLES DRAWN AEROBIC AND ANAEROBIC Blood Culture adequate volume   Culture   Final    NO GROWTH 5 DAYS Performed at [facility], [address] [place], [place] [address]    Report Status 11/13/2023 FINAL  Final  Respiratory (~20 pathogens) panel by PCR     Status: None   Collection Time: 11/08/23  8:42 AM   Specimen: Nasopharyngeal Swab; Respiratory  Result Value Ref Range Status   Adenovirus NOT DETECTED NOT DETECTED Final   Coronavirus 229E NOT DETECTED NOT DETECTED Final    Comment: (NOTE) The Coronavirus on the Respiratory Panel, DOES NOT test for the novel  Coronavirus (2019 nCoV)    Coronavirus HKU1 NOT DETECTED NOT DETECTED Final   Coronavirus NL63 NOT DETECTED NOT DETECTED Final   Coronavirus OC43 NOT DETECTED NOT DETECTED Final   Metapneumovirus NOT DETECTED NOT DETECTED Final   Rhinovirus / Enterovirus NOT DETECTED NOT DETECTED Final   Influenza A NOT DETECTED NOT DETECTED Final   Influenza B NOT DETECTED NOT DETECTED Final   Parainfluenza Virus 1 NOT DETECTED NOT DETECTED Final   Parainfluenza Virus 2 NOT DETECTED NOT DETECTED Final   Parainfluenza Virus 3 NOT DETECTED NOT DETECTED Final   Parainfluenza Virus 4 NOT DETECTED NOT DETECTED Final   Respiratory Syncytial Virus NOT DETECTED NOT DETECTED Final   Bordetella pertussis NOT DETECTED NOT DETECTED Final   Bordetella Parapertussis NOT DETECTED NOT DETECTED Final   Chlamydophila pneumoniae NOT DETECTED NOT DETECTED Final   Mycoplasma pneumoniae NOT DETECTED NOT DETECTED Final    Comment: Performed at [facility], [address],  [place], [place] [address]  Expectorated Sputum Assessment w Gram Stain, Rflx to Resp Cult     Status: None   Collection Time: 11/17/23  2:20 PM   Specimen: Expectorated Sputum  Result Value Ref Range Status   Specimen Description EXPECTORATED SPUTUM  Final   Special Requests Immunocompromised  Final   Sputum evaluation   Final    THIS SPECIMEN IS ACCEPTABLE FOR SPUTUM CULTURE Performed at [facility], [address] [place], [place] [address]    Report Status 11/17/2023 FINAL  Final  Culture, Respiratory w Gram Stain     Status: None   Collection Time: 11/17/23  2:20 PM  Result Value Ref Range Status   Specimen Description EXPECTORATED SPUTUM  Final   Special Requests Immunocompromised Reflexed from [id number]  Final   Gram Stain NO WBC SEEN FEW GRAM POSITIVE COCCI IN PAIRS   Final   Culture   Final    MODERATE Normal respiratory flora-no Staph aureus or Pseudomonas seen Performed at  [facility], [address], [place], [place] [address]    Report Status 11/19/2023 FINAL  Final  MRSA Next Gen by PCR, Nasal     Status: Abnormal   Collection Time: 11/25/23 11:35 PM   Specimen: Nasal Mucosa; Nasal Swab  Result Value Ref Range Status   MRSA by PCR Next Gen DETECTED (A) NOT DETECTED Final    Comment: RESULT CALLED TO, READ BACK BY AND VERIFIED WITH: SHAFFNER,P RN 11/26/2023 AT 0218 SKEEN,P (NOTE) The GeneXpert MRSA Assay (FDA approved for NASAL specimens only), is one component of a comprehensive MRSA colonization surveillance program. It is not intended to diagnose MRSA infection nor to guide or monitor treatment for MRSA infections. Test performance is not FDA approved in patients less than 5 years old. Performed at [facility], [address] [place], [place] [address]   Culture, Respiratory w Gram Stain     Status: None (Preliminary result)   Collection Time: 11/27/23  9:27 AM   Specimen: Bronchoalveolar Lavage; Respiratory  Result Value Ref Range Status    Specimen Description BRONCHIAL ALVEOLAR LAVAGE  Final   Special Requests NONE  Final   Gram Stain   Final    NO WBC SEEN NO ORGANISMS SEEN Performed at [facility], [address] [place], [place] [address]    Culture PENDING  Incomplete   Report Status PENDING  Incomplete   Pertinent Lab.    Latest Ref Rng & Units 11/27/2023    2:02 PM 11/27/2023    4:04 AM 11/27/2023    2:36 AM  CBC  WBC 4.0 - 10.5 K/uL  10.9    Hemoglobin 13.0 - 17.0 g/dL 8.5  8.2  8.8   Hematocrit 39.0 - 52.0 % 25.0  25.7  26.0   Platelets 150 - 400 K/uL  237        Latest Ref Rng & Units 11/27/2023    2:02 PM 11/27/2023   12:21 PM 11/27/2023    7:17 AM  CMP  Sodium 135 - 145 mmol/L 128     Potassium 3.5 - 5.1 mmol/L 6.3  6.3  4.5    Pertinent Imaging today Plain films and CT images have been personally visualized and interpreted; radiology reports have been reviewed. Decision making incorporated into the Impression /   VAS US  UPPER EXTREMITY VENOUS DUPLEX Result Date: 11/27/2023 UPPER VENOUS STUDY  Patient Name:  KEON PENDER  Date of Exam:   11/27/2023 Medical Rec #: [id number]      Accession #:    [id number] Date of Birth: 1990-10-08      Patient Gender: M Patient Age:   66 years Exam Location:  [facility] Procedure:      VAS US  UPPER EXTREMITY VENOUS DUPLEX Referring Phys: TORIBIO SHARPS --------------------------------------------------------------------------------  Indications: Edema Limitations: Multiple lines to BUE. Comparison Study: No previous exams Performing Technologist: Jody Hill RVT, RDMS  Examination Guidelines: A complete evaluation includes B-mode imaging, spectral Doppler, color Doppler, and power Doppler as needed of all accessible portions of each vessel. Bilateral testing is considered an integral part of a complete examination. Limited examinations for reoccurring indications may be performed as noted.  Right Findings:  +----------+------------+---------+-----------+----------+---------------------+ RIGHT     CompressiblePhasicitySpontaneousProperties       Summary        +----------+------------+---------+-----------+----------+---------------------+ IJV           Full       Yes       Yes                                    +----------+------------+---------+-----------+----------+---------------------+  Subclavian    Full       No        Yes                                    +----------+------------+---------+-----------+----------+---------------------+ Axillary      Full       Yes       Yes                                    +----------+------------+---------+-----------+----------+---------------------+ Brachial      Full       Yes       Yes                                    +----------+------------+---------+-----------+----------+---------------------+ Radial        Full                                                        +----------+------------+---------+-----------+----------+---------------------+ Ulnar         Full                                                        +----------+------------+---------+-----------+----------+---------------------+ Cephalic      None       No        No               Age indeterminate at                                                           AC/forearm       +----------+------------+---------+-----------+----------+---------------------+ Basilic       Full       Yes       Yes                                    +----------+------------+---------+-----------+----------+---------------------+  Left Findings: +----------+------------+---------+-----------+----------+---------------------+ LEFT      CompressiblePhasicitySpontaneousProperties       Summary        +----------+------------+---------+-----------+----------+---------------------+ IJV           Full       Yes       Yes                                     +----------+------------+---------+-----------+----------+---------------------+ Subclavian    Full       Yes       Yes                                    +----------+------------+---------+-----------+----------+---------------------+  Axillary      Full       Yes       Yes                                    +----------+------------+---------+-----------+----------+---------------------+ Brachial      Full       Yes       Yes                                    +----------+------------+---------+-----------+----------+---------------------+ Radial        Full                                                        +----------+------------+---------+-----------+----------+---------------------+ Ulnar         Full                                                        +----------+------------+---------+-----------+----------+---------------------+ Cephalic      None       No        No               age indeterminate at                                                         AC/prox forearm    +----------+------------+---------+-----------+----------+---------------------+ Basilic       Full       Yes       Yes                                    +----------+------------+---------+-----------+----------+---------------------+  Summary:  Right: No evidence of deep vein thrombosis in the upper extremity. Findings consistent with age indeterminate superficial vein thrombosis involving the right cephalic vein.  Left: No evidence of deep vein thrombosis in the upper extremity. Findings consistent with age indeterminate superficial vein thrombosis involving the left cephalic vein.  *See table(s) above for measurements and observations.  Diagnosing physician: Lonni Gaskins MD Electronically signed by Lonni Gaskins MD on 11/27/2023 at 4:27:41 PM.    Final    ECHOCARDIOGRAM COMPLETE Result Date: 11/27/2023    ECHOCARDIOGRAM REPORT   Patient Name:   OREST DYGERT Date of Exam: 11/27/2023 Medical Rec #:  [id number]     Height:       67.0 in Accession #:    [id number]    Weight:       108.0 lb Date of Birth:  1991/03/14     BSA:          1.556 m Patient Age:    32 years      BP:           104/80 mmHg Patient Gender: M  HR:           107 bpm. Exam Location:  Inpatient Procedure: 2D Echo, Cardiac Doppler and Color Doppler (Both Spectral and Color            Flow Doppler were utilized during procedure). STAT ECHO Indications:    I26.02 Pulmonary embolus  History:        Patient has no prior history of Echocardiogram examinations.                 Signs/Symptoms:Shortness of Breath; Risk Factors:Hypertension.                 HIV.  Sonographer:    Ellouise Mose RDCS Referring Phys: [id number] TORIBIO JAYSON SHARPS IMPRESSIONS  1. Left ventricular ejection fraction, by estimation, is 70 to 75%. The left ventricle has hyperdynamic function. The left ventricle has no regional wall motion abnormalities. Indeterminate diastolic filling due to E-A fusion.  2. Right ventricular systolic function is low normal. The right ventricular size is moderately enlarged. Tricuspid regurgitation signal is inadequate for assessing PA pressure.  3. The mitral valve is normal in structure. Trivial mitral valve regurgitation. No evidence of mitral stenosis.  4. The aortic valve is tricuspid. Aortic valve regurgitation is not visualized. No aortic stenosis is present.  5. The inferior vena cava is dilated in size with <50% respiratory variability, suggesting right atrial pressure of 15 mmHg. Comparison(s): No prior Echocardiogram. FINDINGS  Left Ventricle: Left ventricular ejection fraction, by estimation, is 70 to 75%. The left ventricle has hyperdynamic function. The left ventricle has no regional wall motion abnormalities. The left ventricular internal cavity size was normal in size. There is no left ventricular hypertrophy. Indeterminate diastolic filling due to E-A fusion. Right Ventricle: The right  ventricular size is moderately enlarged. No increase in right ventricular wall thickness. Right ventricular systolic function is low normal. Tricuspid regurgitation signal is inadequate for assessing PA pressure. Left Atrium: Left atrial size was normal in size. Right Atrium: Right atrial size was normal in size. Pericardium: There is no evidence of pericardial effusion. Mitral Valve: The mitral valve is normal in structure. Trivial mitral valve regurgitation. No evidence of mitral valve stenosis. Tricuspid Valve: The tricuspid valve is normal in structure. Tricuspid valve regurgitation is trivial. No evidence of tricuspid stenosis. Aortic Valve: The aortic valve is tricuspid. Aortic valve regurgitation is not visualized. No aortic stenosis is present. Pulmonic Valve: The pulmonic valve was normal in structure. Pulmonic valve regurgitation is trivial. No evidence of pulmonic stenosis. Aorta: The aortic root and ascending aorta are structurally normal, with no evidence of dilitation. Venous: The inferior vena cava is dilated in size with less than 50% respiratory variability, suggesting right atrial pressure of 15 mmHg. IAS/Shunts: There is right bowing of the interatrial septum, suggestive of elevated left atrial pressure. The interatrial septum appears to be lipomatous. No atrial level shunt detected by color flow Doppler.  LEFT VENTRICLE PLAX 2D LVIDd:         4.70 cm      Diastology LVIDs:         2.60 cm      LV e' medial:    10.90 cm/s LV PW:         1.05 cm      LV E/e' medial:  10.9 LV IVS:        1.05 cm      LV e' lateral:   14.20 cm/s LVOT diam:     2.20 cm  LV E/e' lateral: 8.4 LV SV:         104 LV SV Index:   67 LVOT Area:     3.80 cm  LV Volumes (MOD) LV vol d, MOD A2C: 121.0 ml LV vol d, MOD A4C: 97.8 ml LV vol s, MOD A2C: 33.9 ml LV vol s, MOD A4C: 28.5 ml LV SV MOD A2C:     87.1 ml LV SV MOD A4C:     97.8 ml LV SV MOD BP:      80.8 ml RIGHT VENTRICLE             IVC RV S prime:     15.30 cm/s   IVC diam: 2.10 cm TAPSE (M-mode): 2.9 cm LEFT ATRIUM             Index        RIGHT ATRIUM           Index LA diam:        3.10 cm 1.99 cm/m   RA Area:     11.60 cm LA Vol (A2C):   16.2 ml 10.41 ml/m  RA Volume:   23.70 ml  15.23 ml/m LA Vol (A4C):   28.4 ml 18.25 ml/m LA Biplane Vol: 23.2 ml 14.91 ml/m  AORTIC VALVE LVOT Vmax:   99.45 cm/s LVOT Vmean:  118.000 cm/s LVOT VTI:    0.273 m  AORTA Ao Root diam: 3.30 cm Ao Asc diam:  3.10 cm MITRAL VALVE MV Area (PHT): 5.13 cm     SHUNTS MV Decel Time: 148 msec     Systemic VTI:  0.27 m MV E velocity: 119.00 cm/s  Systemic Diam: 2.20 cm MV A velocity: 107.00 cm/s MV E/A ratio:  1.11 Morene Brownie Electronically signed by Morene Brownie Signature Date/Time: 11/27/2023/2:26:38 PM    Final    DG Chest Port 1 View Result Date: 11/27/2023 CLINICAL DATA:  ARDS due to sepsis and pneumonia. EXAM: PORTABLE CHEST 1 VIEW COMPARISON:  11/26/2023 FINDINGS: The endotracheal tube tip is above the carina. Enteric tube is in place with tip below the GE junction. Stable cardiomediastinal contours. Persistent diffuse interstitial and airspace opacities with increasing consolidative change in the right midlung. IMPRESSION: Persistent diffuse interstitial and airspace opacities with increasing consolidative change in the right midlung. Stable support apparatus. Electronically Signed   By: Waddell Calk M.D.   On: 11/27/2023 11:42   DG Chest Port 1 View Result Date: 11/26/2023 CLINICAL DATA:  History of endotracheal tube EXAM: PORTABLE CHEST 1 VIEW COMPARISON:  Chest x-ray 11/26/2023 FINDINGS: Endotracheal tube tip is 5.5 cm above the carina. Enteric tube extends below the diaphragm. Diffuse bilateral airspace opacities have mildly decreased. Costophrenic angles are clear. No pneumothorax or acute fracture. Thickening only IMPRESSION: 1. Endotracheal tube tip is 5.5 cm above the carina. 2. Diffuse bilateral airspace opacities have mildly decreased. Electronically Signed   By:  Greig Pique M.D.   On: 11/26/2023 21:44    I spent 50 minutes involved in face-to-face and non-face-to-face activities for this patient on the day of the visit. Professional time spent includes the following activities: Preparing to see the patient (review of tests), Obtaining and reviewing separately obtained history (admission/discharge record), Performing a medically appropriate examination and evaluation , Ordering medications/labs, referring and communicating with other health care professionals, Documenting clinical information in the EMR, Independently interpreting results (not separately reported), Communicating results to the patient/family/caregiver, Counseling and educating the patient/family/caregiver and Care coordination (not separately reported).   Plan  d/w requesting provider as well as ID pharm D  Of note, portions of this note may have been created with voice recognition software. While this note has been edited for accuracy, occasional wrong-word or 'sound-a-like' substitutions may have occurred due to the inherent limitations of voice recognition software.   Electronically signed by:   Annalee Orem, MD Infectious Disease Physician [facility] Pager: [phone]

## 2023-11-27 NOTE — Progress Notes (Signed)
 PCCM Brief Note  PM Rounds  NO down to 4. Vent settings stable. Remain on nimbex.  K 6.3 - receiving Lokelma  x 2 doses, 1amp D50, 10u insulin. Potassium q4hr checks, next at 2200.   Sammi Gore, PA - [facility] For pager details, please see AMION or use Epic chat  After 1900, please call [facility] for cross coverage needs 11/27/2023, 3:39 PM

## 2023-11-27 NOTE — Progress Notes (Signed)
 PT Cancellation/Discharge Note  Patient Details Name: Cory Russell MRN: [id number] DOB: Jul 16, 1990   Cancelled Treatment:    Reason Eval/Treat Not Completed: Medical issues which prohibited therapy  Patient on vent and unarousable (RASS -5). PT is signing off. Please re-order when appropriate.    Macario RAMAN, PT Acute Rehabilitation Services  Office [phone]  Macario SHAUNNA Soja 11/27/2023, 8:01 AM

## 2023-11-27 NOTE — Progress Notes (Signed)
  Echocardiogram 2D Echocardiogram has been performed.  Cory Russell 11/27/2023, 2:13 PM

## 2023-11-27 NOTE — Progress Notes (Signed)
   11/27/23 1102  Spiritual Encounters  Type of Visit Follow up  Care provided to: Family  Reason for visit Routine spiritual support  OnCall Visit No  Spiritual Framework  Presenting Themes Meaning/purpose/sources of inspiration  Community/Connection Family  Strengths [place], family  Family Stress Factors Health changes  Interventions  Spiritual Care Interventions Made Established relationship of care and support;Encouragement  Intervention Outcomes  Outcomes Awareness of support;Reduced anxiety   Follow up with the Pt, who had been transferred from  6E to [facility]. The Pt's mother was present during the visit. She expressed gratitude for the medical staff and shared memories and reflections about life.  She appreciated the chaplain's presence and the opportunity to talk. Provide a ministry of presence and active listening during this meaningful time.

## 2023-11-27 NOTE — Procedures (Signed)
 Bronchoscopy Procedure Note  Cory Russell  [id number]  1991/03/11  Date:11/27/23  Time:12:26 PM   Provider Performing:Delara Shepheard C Claudene   Procedure(s):  Flexible bronchoscopy with bronchial alveolar lavage [phone])  Indication(s) Abnormal CXR  Consent Verbal from mother over phone  Anesthesia In place for mechanical ventilation   Time Out Verified patient identification, verified procedure, site/side was marked, verified correct patient position, special equipment/implants available, medications/allergies/relevant history reviewed, required imaging and test results available.   Sterile Technique Usual hand hygiene, masks, gowns, and gloves were used   Procedure Description Bronchoscope advanced through endotracheal tube and into airway.  Airways were examined down to subsegmental level with findings noted below.   Following diagnostic evaluation, BAL(s) performed in RML with normal saline and return of slightly cloudy fluid  Findings:  - Small thick mucoid secretions - Mild bronchiitic changes - ETT in good position  Complications/Tolerance None; patient tolerated the procedure well. Chest X-ray is not needed post procedure.   EBL Minimal   Specimen(s) RML BAL

## 2023-11-27 NOTE — Progress Notes (Signed)
 NAME:  Elva Breaker, MRN:  [id number], DOB:  07/17/1990, LOS: 19 ADMISSION DATE:  11/08/2023, CONSULTATION DATE:  11/27/2023 REFERRING MD:  Claudene Toribio BROCKS, MD, CHIEF COMPLAINT:  respiratory failure   History of Present Illness:  Kainoah Bartosiewicz is a 33 y.o. man with past medical history of HIV/AIDS, CD4<35 on admission, not on ARVs. Presents with seizure like activity 11 days ago and found to have sepsis from pneumonia.  He notes symptoms started about 2-3 weeks ago with shortness of breath and dry cough. He had two separate rounds of antibiotics visiting other urgent care and ED visits. He has tried albuterol  nebulizer treatments from friends and initially found them helpful.   CT Chest shows bilateral ground glass opacities concerning for pneumoncystis pneumonia. Since being here he has been Started on empiric antibiotics with bactrim  and steroids. Also resumed ARVs during this admission. Has been persistently hypoxemic on 10LNC. Pulmonary consulted to assist with evaluation and management and for bronchoscopy.   He feels persistently short of breath, worse with exertion. He feels like chest PT is helping him. Feeling more anxious. Also having difficulty taking pills.   Pertinent  Medical History  HIV, Seizure disorder  Significant Hospital Events: Including procedures, antibiotic start and stop dates in addition to other pertinent events   6/4-admit 6/15 pulmonary consult 6/18 transient desats with worsening chest x-ray.  Prednisone  changed to IV Solu-Medrol  6/22 worsening respiratory status requiring ICU transfer and intubation 6/23 bronch for BAL per ID request. NO being weaned from 20 to 15  Interim History / Subjective:  Doing well this AM supine. NO weaned from 20 to 15. PF 210 Just had bronch with BAL after ID request.  Objective    Blood pressure 104/60, pulse (!) 116, temperature 98.2 F (36.8 C), temperature source Axillary, resp. rate (!) 32, height 5' 7 (1.702 m), weight  49 kg, SpO2 98%.    Vent Mode: PRVC FiO2 (%):  [70 %-100 %] 70 % Set Rate:  [32 bmp] 32 bmp Vt Set:  [410 mL] 410 mL PEEP:  [8 cmH20] 8 cmH20 Plateau Pressure:  [28 cmH20-34 cmH20] 28 cmH20   Intake/Output Summary (Last 24 hours) at 11/27/2023 1004 Last data filed at 11/27/2023 0800 Gross per 24 hour  Intake 3837.6 ml  Output 2540 ml  Net 1297.6 ml   Filed Weights   11/22/23 0600 11/26/23 0315 11/27/23 0500  Weight: 51.2 kg 49.3 kg 49 kg   General:  critically ill appearing man lying in bed in NAD HEENT: NCAT, eyes anicteric Neuro: RASS -5, sedated/paralyzed CV: tachycardic, regular, no M/R/G PULM:  Coarse bilaterally GI: nondistended Extremities: no edema, no cyanosis   Assessment and Plan   Acute hypoxemic & hypercapnic respiratory failure with ARDS Concern for superimposed HAP vs aspiration to explain his worsening several weeks into his hospitalization Severe Pneumocystis pneumonia Suspected IRIS -LTVV;4-8cc/kg IBW with goal Pplat <30 & DP <15 -VAP prevention protocol -PAD protocol for sedation -Continue nimbex for now, wean after NO is weaned further or off -Continue NO, weaning currently. Down from 20 to 15 -Continue empiric Zyvox/Cefepime - ID following -F/u on bronch - AFB, PCP, fungal culture, CMV DNA -con't PJP treatment - ID changing Bactrim  to Clinda + Primaquine . 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 A1c 6.4 -SSI PRN -goal  BG 140-180  Severe protein energy malnutrition Cachexia -TF's  Mother updated by Dr. Claudene.    Best Practice Diet/type: TF DVT prophylaxis prophylactic heparin  Pressure ulcer(s): N/A GI prophylaxis: H2B Lines: arterial line Foley:  Yes, and it is still needed Code Status:  full code Last date of multidisciplinary goals of care discussion [Patient affirmed he wants full code and all life support options 6/22]   Sammi Gore, PA - [facility] For pager details, please see AMION or use Epic chat  After 1900, please call ELINK for cross coverage needs 11/27/2023, 10:26 AM

## 2023-11-27 NOTE — Procedures (Signed)
 Insertion of Chest Tube Procedure Note  Elhadj Girton  [id number]  1990/11/26  Date:11/27/23  Time:9:49 PM    Provider Performing: Norleen JONETTA Cedar   Procedure: Chest Tube Insertion [id number])  Indication(s) Pneumothorax  Consent Risks of the procedure as well as the alternatives and risks of each were explained to the patient and/or caregiver.  Consent for the procedure was obtained and is signed in the bedside chart  Anesthesia Topical only with 1% lidocaine     Time Out Verified patient identification, verified procedure, site/side was marked, verified correct patient position, special equipment/implants available, medications/allergies/relevant history reviewed, required imaging and test results available.   Sterile Technique Maximal sterile technique including full sterile barrier drape, hand hygiene, sterile gown, sterile gloves, mask, hair covering, sterile ultrasound probe cover (if used).   Procedure Description Ultrasound used to identify appropriate pleural anatomy for placement and overlying skin marked. Area of placement cleaned and draped in sterile fashion.  A 14 French pigtail pleural catheter was placed into the left pleural space using Seldinger technique. Appropriate return of air was obtained.  The tube was connected to atrium and placed on -20 cm H2O wall suction.   Complications/Tolerance None; patient tolerated the procedure well. Chest X-ray is ordered to verify placement.   EBL Minimal  Specimen(s) none  JD Cedar RIGGERS [facility] 11/27/2023, 9:50 PM  Please see [email or web address] for pager details.  From 7A-7P if no response, please call [phone]. After hours, please call ELink [phone].

## 2023-11-27 NOTE — Progress Notes (Addendum)
 eLink Physician-Brief Progress Note Patient Name: Cory Russell DOB: 1990-11-19 MRN: [id number]   Date of Service  11/27/2023  HPI/Events of Note  Patient had desaturation down to the 80s, FiO2 increased from 70% to 100%.  PEEP was escalated from 8-12.  Current SF ratio still 84.  eICU Interventions  Plan to reproning the patient Ventilator waveforms appropriate without any leak, reduced breath sounds on the left.  Will obtain a chest radiograph to ensure there is no massive consolidation or new pneumothorax, but suspect this is a combination of progressive hypoxemia from reduced INO and ongoing ARDS   2053 -concern for pneumothorax in the setting of persistent hypoxemia, no tension physiology yet.  Will refer to ground team for potential chest tube  2224 -post chest tube radiograph reviewed with appropriate reexpansion, maintain -20 suction on tube, orders updated, a.m. chest radiograph ordered, follow-up ABG at midnight to de-escalate vent settings  2300 - K 6.3, repeating hyperkalemia protocol with Lokelma .  Switched off Bactrim  per ID  Intervention Category Major Interventions: Respiratory failure - evaluation and management  Rydan Gulyas 11/27/2023, 8:33 PM

## 2023-11-28 ENCOUNTER — Inpatient Hospital Stay (HOSPITAL_COMMUNITY)

## 2023-11-28 DIAGNOSIS — B59 Pneumocystosis: Secondary | ICD-10-CM | POA: Diagnosis not present

## 2023-11-28 DIAGNOSIS — B20 Human immunodeficiency virus [HIV] disease: Secondary | ICD-10-CM | POA: Diagnosis not present

## 2023-11-28 DIAGNOSIS — E875 Hyperkalemia: Secondary | ICD-10-CM | POA: Diagnosis not present

## 2023-11-28 DIAGNOSIS — D893 Immune reconstitution syndrome: Secondary | ICD-10-CM

## 2023-11-28 DIAGNOSIS — E43 Unspecified severe protein-calorie malnutrition: Secondary | ICD-10-CM | POA: Diagnosis not present

## 2023-11-28 DIAGNOSIS — N179 Acute kidney failure, unspecified: Secondary | ICD-10-CM | POA: Diagnosis not present

## 2023-11-28 DIAGNOSIS — J80 Acute respiratory distress syndrome: Secondary | ICD-10-CM | POA: Diagnosis not present

## 2023-11-28 DIAGNOSIS — E871 Hypo-osmolality and hyponatremia: Secondary | ICD-10-CM | POA: Diagnosis not present

## 2023-11-28 DIAGNOSIS — J939 Pneumothorax, unspecified: Secondary | ICD-10-CM

## 2023-11-28 LAB — CBC
HCT: 21.5 % — ABNORMAL LOW (ref 39.0–52.0)
Hemoglobin: 7.1 g/dL — ABNORMAL LOW (ref 13.0–17.0)
MCH: 29.2 pg (ref 26.0–34.0)
MCHC: 33 g/dL (ref 30.0–36.0)
MCV: 88.5 fL (ref 80.0–100.0)
Platelets: 191 10*3/uL (ref 150–400)
RBC: 2.43 MIL/uL — ABNORMAL LOW (ref 4.22–5.81)
RDW: 16.8 % — ABNORMAL HIGH (ref 11.5–15.5)
WBC: 6.9 10*3/uL (ref 4.0–10.5)
nRBC: 0.4 % — ABNORMAL HIGH (ref 0.0–0.2)

## 2023-11-28 LAB — POCT I-STAT 7, (LYTES, BLD GAS, ICA,H+H)
Acid-Base Excess: 8 mmol/L — ABNORMAL HIGH (ref 0.0–2.0)
Bicarbonate: 34.6 mmol/L — ABNORMAL HIGH (ref 20.0–28.0)
Calcium, Ion: 1.14 mmol/L — ABNORMAL LOW (ref 1.15–1.40)
HCT: 20 % — ABNORMAL LOW (ref 39.0–52.0)
Hemoglobin: 6.8 g/dL — CL (ref 13.0–17.0)
O2 Saturation: 100 %
Patient temperature: 36.8
Potassium: 5.8 mmol/L — ABNORMAL HIGH (ref 3.5–5.1)
Sodium: 126 mmol/L — ABNORMAL LOW (ref 135–145)
TCO2: 36 mmol/L — ABNORMAL HIGH (ref 22–32)
pCO2 arterial: 59.1 mmHg — ABNORMAL HIGH (ref 32–48)
pH, Arterial: 7.374 (ref 7.35–7.45)
pO2, Arterial: 196 mmHg — ABNORMAL HIGH (ref 83–108)

## 2023-11-28 LAB — CYTOMEGALOVIRUS DNA, QUANTITATIVE REAL-TIME PCR, PLASMA
CMV DNA Quant: 39400 [IU]/mL
Log10 CMV Qn DNA Pl: 4.595 {Log_IU}/mL

## 2023-11-28 LAB — BASIC METABOLIC PANEL WITH GFR
Anion gap: 9 (ref 5–15)
BUN: 32 mg/dL — ABNORMAL HIGH (ref 6–20)
CO2: 33 mmol/L — ABNORMAL HIGH (ref 22–32)
Calcium: 8 mg/dL — ABNORMAL LOW (ref 8.9–10.3)
Chloride: 89 mmol/L — ABNORMAL LOW (ref 98–111)
Creatinine, Ser: 1.25 mg/dL — ABNORMAL HIGH (ref 0.61–1.24)
GFR, Estimated: >60 mL/min (ref >60)
Glucose, Bld: 104 mg/dL — ABNORMAL HIGH (ref 70–99)
Potassium: 6.2 mmol/L — ABNORMAL HIGH (ref 3.5–5.1)
Sodium: 131 mmol/L — ABNORMAL LOW (ref 135–145)

## 2023-11-28 LAB — PHOSPHORUS: Phosphorus: 3.9 mg/dL (ref 2.5–4.6)

## 2023-11-28 LAB — GLUCOSE, CAPILLARY
Glucose-Capillary: 110 mg/dL — ABNORMAL HIGH (ref 70–99)
Glucose-Capillary: 176 mg/dL — ABNORMAL HIGH (ref 70–99)
Glucose-Capillary: 182 mg/dL — ABNORMAL HIGH (ref 70–99)
Glucose-Capillary: 158 mg/dL — ABNORMAL HIGH (ref 70–99)
Glucose-Capillary: 126 mg/dL — ABNORMAL HIGH (ref 70–99)

## 2023-11-28 LAB — POTASSIUM
Potassium: 6.1 mmol/L — ABNORMAL HIGH (ref 3.5–5.1)
Potassium: 5.6 mmol/L — ABNORMAL HIGH (ref 3.5–5.1)
Potassium: 5.3 mmol/L — ABNORMAL HIGH (ref 3.5–5.1)
Potassium: 5.1 mmol/L (ref 3.5–5.1)
Potassium: 4.8 mmol/L (ref 3.5–5.1)

## 2023-11-28 LAB — MAGNESIUM: Magnesium: 2.9 mg/dL — ABNORMAL HIGH (ref 1.7–2.4)

## 2023-11-28 LAB — ACID FAST SMEAR (AFB, MYCOBACTERIA): Acid Fast Smear: NEGATIVE

## 2023-11-28 MED ORDER — SODIUM ZIRCONIUM CYCLOSILICATE 10 G PO PACK
10.0000 g | PACK | ORAL | Status: DC
  Filled 2023-11-28: qty 1

## 2023-11-28 MED ORDER — SODIUM ZIRCONIUM CYCLOSILICATE 10 G PO PACK
10.0000 g | PACK | Freq: Once | ORAL | Status: AC
  Administered 2023-11-28: 10 g
  Filled 2023-11-28: qty 1

## 2023-11-28 MED ORDER — ADULT MULTIVITAMIN W/MINERALS CH
1.0000 | ORAL_TABLET | Freq: Every day | ORAL | Status: DC
  Administered 2023-11-29 – 2023-12-16 (×18): 1
  Filled 2023-11-28 (×18): qty 1

## 2023-11-28 MED ORDER — FENTANYL 2500MCG IN NS 250ML (10MCG/ML) PREMIX INFUSION
0.0000 ug/h | INTRAVENOUS | Status: DC
  Administered 2023-11-28 – 2023-11-29 (×2): 300 ug/h via INTRAVENOUS
  Administered 2023-11-29: 275 ug/h via INTRAVENOUS
  Administered 2023-11-30 (×3): 350 ug/h via INTRAVENOUS
  Filled 2023-11-28 (×6): qty 250

## 2023-11-28 MED ORDER — NEPRO/CARBSTEADY PO LIQD: 1000.0000 mL | ORAL | Status: DC

## 2023-11-28 MED ORDER — SODIUM CHLORIDE 0.9% FLUSH
10.0000 mL | Freq: Three times a day (TID) | INTRAVENOUS | Status: DC
  Administered 2023-11-28 – 2023-12-03 (×11): 10 mL via INTRAPLEURAL

## 2023-11-28 MED ORDER — SODIUM ZIRCONIUM CYCLOSILICATE 10 G PO PACK
10.0000 g | PACK | ORAL | Status: AC
  Administered 2023-11-28 (×3): 10 g
  Filled 2023-11-28 (×2): qty 1

## 2023-11-28 MED ORDER — DAPSONE 100 MG PO TABS
100.0000 mg | ORAL_TABLET | Freq: Every day | ORAL | Status: DC
  Administered 2023-11-29 – 2023-12-16 (×18): 100 mg
  Filled 2023-11-28 (×18): qty 1

## 2023-11-28 MED ORDER — SODIUM BICARBONATE 8.4 % IV SOLN
50.0000 meq | Freq: Once | INTRAVENOUS | Status: AC
  Administered 2023-11-28: 50 meq via INTRAVENOUS
  Filled 2023-11-28: qty 50

## 2023-11-28 MED ORDER — MIDAZOLAM HCL 2 MG/2ML IJ SOLN
1.0000 mg | INTRAMUSCULAR | Status: DC | PRN
  Administered 2023-11-28 – 2023-11-30 (×9): 2 mg via INTRAVENOUS
  Filled 2023-11-28 (×10): qty 2

## 2023-11-28 MED ORDER — NEPRO/CARBSTEADY PO LIQD
1000.0000 mL | ORAL | Status: DC
  Administered 2023-11-29 – 2023-11-30 (×2): 1000 mL

## 2023-11-28 MED ORDER — INSULIN ASPART 100 UNIT/ML IV SOLN
10.0000 [IU] | Freq: Once | INTRAVENOUS | Status: AC
  Administered 2023-11-28: 10 [IU] via INTRAVENOUS

## 2023-11-28 MED ORDER — METHYLPREDNISOLONE SODIUM SUCC 125 MG IJ SOLR
80.0000 mg | Freq: Two times a day (BID) | INTRAMUSCULAR | Status: DC
  Administered 2023-11-28 – 2023-12-02 (×8): 80 mg via INTRAVENOUS
  Filled 2023-11-28 (×8): qty 2

## 2023-11-28 MED ORDER — FUROSEMIDE 10 MG/ML IJ SOLN
40.0000 mg | Freq: Once | INTRAMUSCULAR | Status: AC
  Administered 2023-11-28: 40 mg via INTRAVENOUS
  Filled 2023-11-28: qty 4

## 2023-11-28 MED ORDER — DEXTROSE 50 % IV SOLN
1.0000 | Freq: Once | INTRAVENOUS | Status: AC
  Administered 2023-11-28: 50 mL via INTRAVENOUS
  Filled 2023-11-28: qty 50

## 2023-11-28 MED ORDER — CALCIUM GLUCONATE-NACL 1-0.675 GM/50ML-% IV SOLN
1.0000 g | Freq: Once | INTRAVENOUS | Status: AC
  Administered 2023-11-28: 1000 mg via INTRAVENOUS
  Filled 2023-11-28: qty 50

## 2023-11-28 MED ORDER — ADULT MULTIVITAMIN W/MINERALS CH: 1.0000 | ORAL_TABLET | Freq: Every day | ORAL | Status: DC

## 2023-11-28 MED ORDER — MIDAZOLAM HCL 2 MG/2ML IJ SOLN
2.0000 mg | Freq: Once | INTRAMUSCULAR | Status: AC
Start: 2023-11-28 — End: 2023-11-28
  Administered 2023-11-28: 2 mg via INTRAVENOUS
  Filled 2023-11-28: qty 2

## 2023-11-28 MED ORDER — SODIUM CHLORIDE 0.9 % IV SOLN
2.0000 g | Freq: Three times a day (TID) | INTRAVENOUS | Status: AC
  Administered 2023-11-28 – 2023-12-02 (×13): 2 g via INTRAVENOUS
  Filled 2023-11-28 (×13): qty 12.5

## 2023-11-28 MED ORDER — NEPRO/CARBSTEADY PO LIQD
1000.0000 mL | ORAL | Status: DC
  Administered 2023-11-28: 1000 mL

## 2023-11-28 NOTE — Progress Notes (Signed)
 Occupational Therapy Discharge Patient Details Name: Cory Russell MRN: [id number] DOB: [birth year] Today's Date: 11/28/2023 Time:  -     Patient discharged from OT services secondary to medical decline - will need to re-order OT to resume therapy services. (RASS -5, sedated/paralyzed per notes from critical care)   Please see latest therapy progress note for current level of functioning and progress toward goals.    Progress and discharge plan discussed with patient and/or caregiver: Patient unable to participate in discharge planning and no caregivers available  GO     Cory Russell 11/28/2023, 1:28 PM

## 2023-11-28 NOTE — Progress Notes (Signed)
 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  Remains afebrile.  Labs remarkable for K6.2, creatinine 1.25, hemoglobin 7.1  Assessment 33 year old male with prior history of HIV/AIDS, noncompliance with medications, epilepsy admitted with subacute cough and dyspnea, also seizure.  Currently being treated for possible severe PJP PNA with concerns for IRIS, in the setting of rapid decline of viral load with initiation of ART.   Patient had severe worsening of respite status on 6/22 requiring ICU transfer and intubation.   # Acute Hypoxemic Respiratory Failure due to Possible PJP PNA with possible IRIS complicated with possible HAP   # Hyponatremia/hyperkalemia-in the setting of IV Bactrim , change to alternative treatment with primaquine  and clindamycin . 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 No visual, neurologic or GI symptoms prior to intubation. Uncommon to have CMV pneumonitis and will need biopsy for definitive diagnosis but considered high risk, on preemptive ganciclovir   # Superficial vein thrombosis of left cephalic vein   Recommendations - continue primaquine  and clindamycin  to complete 21 days course, do not see further benefit of extending course. Unlikely to have failure of bactrim .  - PCCM managing IV steroids for concerns for IRIS  - complete 7 days course of linezolid and cefepime pending BAL cultures including viral cultures and CMV viral load.   - Daily CBC and BMP - Fu blastomyces ag and histoplasma ag in urine ordered( not collected) - Will plan to repeat CMV, HIV and CD4 sometime later this week.  - Standard isolation precautions  Thank you for the consult. Please page with pertinent questions or concerns.  ______________________________________________________________________ Subjective patient seen and examined at the bedside. Intubated and sedated, paralyzed. Discussed with mother at bedside.   Vitals BP (!) 111/59 (BP Location: Right Arm)   Pulse (!) 104   Temp 97.8 F (36.6 C) (Axillary)   Resp (!) 30   Ht 5' 7 (1.702 m)   Wt 50.7 kg   SpO2 91%   BMI 17.51 kg/m     Physical Exam Constitutional:  adult male orotracheally intubated    Comments: On fentanyl , propofol , nimbex  Cardiovascular:     Rate and Rhythm: Normal rate and regular rhythm.     Heart sounds:   Pulmonary:     Effort: Pulmonary effort normal; on vent    Comments: coarse mechanical breath sounds b/l   Abdominal:     Palpations: Abdomen is non  distended    Tenderness:   Musculoskeletal:        General: No swelling or tenderness in peripheral joints   Skin:    Comments: no rashes   Neurological:     General: sedated, pupils bilaterally  symmetrical  Pertinent Microbiology Results for orders placed or performed during the hospital encounter of 11/08/23  Culture, blood (Routine X 2) w Reflex to ID Panel     Status: None   Collection Time: 11/08/23  4:18 AM   Specimen: BLOOD  Result Value Ref Range Status   Specimen Description BLOOD BLOOD RIGHT ARM  Final   Special Requests   Final    BOTTLES DRAWN AEROBIC AND ANAEROBIC Blood Culture adequate volume   Culture   Final    NO GROWTH 5 DAYS Performed at [facility], [address], [place], [place] [address]    Report Status 11/13/2023 FINAL  Final  Culture, blood (Routine X 2) w Reflex to ID Panel     Status: None   Collection Time: 11/08/23  4:22 AM   Specimen: BLOOD  Result Value Ref Range Status   Specimen Description BLOOD BLOOD RIGHT ARM  Final   Special Requests   Final    BOTTLES DRAWN AEROBIC AND ANAEROBIC Blood Culture adequate volume   Culture   Final    NO GROWTH 5 DAYS Performed at [facility], [address] [place], [place] [address]    Report Status 11/13/2023 FINAL  Final  Respiratory (~20 pathogens) panel by PCR     Status: None   Collection Time: 11/08/23  8:42 AM   Specimen: Nasopharyngeal Swab; Respiratory  Result Value Ref Range Status   Adenovirus NOT DETECTED NOT DETECTED Final   Coronavirus 229E NOT DETECTED NOT DETECTED Final    Comment: (NOTE) The Coronavirus on the Respiratory Panel, DOES NOT test for the novel  Coronavirus (2019 nCoV)    Coronavirus HKU1 NOT DETECTED NOT DETECTED Final   Coronavirus NL63 NOT DETECTED NOT DETECTED Final   Coronavirus OC43 NOT DETECTED NOT DETECTED Final   Metapneumovirus NOT DETECTED NOT DETECTED Final   Rhinovirus / Enterovirus NOT DETECTED NOT DETECTED Final   Influenza A NOT DETECTED NOT DETECTED Final   Influenza B NOT DETECTED NOT DETECTED Final   Parainfluenza Virus 1 NOT DETECTED NOT DETECTED Final   Parainfluenza Virus 2 NOT DETECTED NOT DETECTED Final   Parainfluenza Virus  3 NOT DETECTED NOT DETECTED Final   Parainfluenza Virus 4 NOT DETECTED NOT DETECTED Final   Respiratory Syncytial Virus NOT DETECTED NOT DETECTED Final   Bordetella pertussis NOT DETECTED NOT DETECTED Final   Bordetella Parapertussis NOT DETECTED NOT DETECTED Final   Chlamydophila pneumoniae NOT DETECTED NOT DETECTED Final   Mycoplasma pneumoniae NOT DETECTED NOT DETECTED Final    Comment: Performed at [facility], [address], [place], [place] [address]  Expectorated Sputum Assessment w Gram Stain, Rflx to Resp Cult     Status: None   Collection Time: 11/17/23  2:20 PM   Specimen: Expectorated Sputum  Result Value Ref Range Status   Specimen Description EXPECTORATED SPUTUM  Final   Special Requests Immunocompromised  Final   Sputum evaluation   Final    THIS SPECIMEN IS ACCEPTABLE FOR SPUTUM CULTURE Performed at [facility], [address] [place], [place] [address]    Report Status 11/17/2023 FINAL  Final  Culture, Respiratory w Gram Stain     Status: None  Collection Time: 11/17/23  2:20 PM  Result Value Ref Range Status   Specimen Description EXPECTORATED SPUTUM  Final   Special Requests Immunocompromised Reflexed from [id number]  Final   Gram Stain NO WBC SEEN FEW GRAM POSITIVE COCCI IN PAIRS   Final   Culture   Final    MODERATE Normal respiratory flora-no Staph aureus or Pseudomonas seen Performed at [facility], [address] [place], [place] [address]    Report Status 11/19/2023 FINAL  Final  MRSA Next Gen by PCR, Nasal     Status: Abnormal   Collection Time: 11/25/23 11:35 PM   Specimen: Nasal Mucosa; Nasal Swab  Result Value Ref Range Status   MRSA by PCR Next Gen DETECTED (A) NOT DETECTED Final    Comment: RESULT CALLED TO, READ BACK BY AND VERIFIED WITH: SHAFFNER,P RN 11/26/2023 AT 0218 SKEEN,P (NOTE) The GeneXpert MRSA Assay (FDA approved for NASAL specimens only), is one component of a comprehensive MRSA colonization  surveillance program. It is not intended to diagnose MRSA infection nor to guide or monitor treatment for MRSA infections. Test performance is not FDA approved in patients less than 8 years old. Performed at [facility], [address], [place], [place] [address]   Culture, Respiratory w Gram Stain     Status: None (Preliminary result)   Collection Time: 11/27/23  9:27 AM   Specimen: Bronchoalveolar Lavage; Respiratory  Result Value Ref Range Status   Specimen Description BRONCHIAL ALVEOLAR LAVAGE  Final   Special Requests NONE  Final   Gram Stain   Final    NO WBC SEEN NO ORGANISMS SEEN Performed at [facility], [address], [place], [place] [address]    Culture PENDING  Incomplete   Report Status PENDING  Incomplete   Pertinent Lab.    Latest Ref Rng & Units 11/28/2023    4:10 AM 11/28/2023   12:04 AM 11/27/2023    2:02 PM  CBC  WBC 4.0 - 10.5 K/uL 6.9     Hemoglobin 13.0 - 17.0 g/dL 7.1  6.8  8.5   Hematocrit 39.0 - 52.0 % 21.5  20.0  25.0   Platelets 150 - 400 K/uL 191         Latest Ref Rng & Units 11/28/2023    9:52 AM 11/28/2023    4:10 AM 11/28/2023    2:25 AM  CMP  Glucose 70 - 99 mg/dL  895    BUN 6 - 20 mg/dL  32    Creatinine 9.38 - 1.24 mg/dL  8.74    Sodium 864 - 854 mmol/L  131    Potassium 3.5 - 5.1 mmol/L 5.6  6.2  6.1   Chloride 98 - 111 mmol/L  89    CO2 22 - 32 mmol/L  33    Calcium  8.9 - 10.3 mg/dL  8.0     Pertinent Imaging today Plain films and CT images have been personally visualized and interpreted; radiology reports have been reviewed. Decision making incorporated into the Impression /   DG CHEST PORT 1 VIEW Result Date: 11/28/2023 CLINICAL DATA:  Hypoxia EXAM: PORTABLE CHEST 1 VIEW COMPARISON:  11/27/2023 FINDINGS: Left sided pigtail thoracostomy tube is noted with tip overlying the left lower lobe. No pneumothorax identified. ET tube tip is above the carina. There is a feeding tube with tip in the distal stomach. Stable  cardiomediastinal contours. Unchanged bilateral interstitial and airspace opacities. Visualized osseous structures are unremarkable. IMPRESSION: 1. Stable  support apparatus. 2. No pneumothorax identified. 3. Unchanged bilateral interstitial and airspace opacities. Electronically Signed   By: Waddell Calk M.D.   On: 11/28/2023 06:59   DG Chest Port 1 View Result Date: 11/27/2023 CLINICAL DATA:  Encounter for pneumothorax EXAM: PORTABLE CHEST 1 VIEW COMPARISON:  11/27/2023 FINDINGS: Interval placement of left chest tube with near complete re-expansion of the left lung. Small residual left apical pneumothorax. Endotracheal tube and feeding tube remain in place, unchanged. Patchy bilateral airspace disease. No effusions. IMPRESSION: Interval placement of left chest tube with near complete re-expansion of the left lung. Small residual left apical pneumothorax. Patchy bilateral airspace disease. Electronically Signed   By: Franky Crease M.D.   On: 11/27/2023 22:27   DG CHEST PORT 1 VIEW Addendum Date: 11/27/2023 ADDENDUM REPORT: 11/27/2023 21:04 ADDENDUM: Discussed with Dr. Haze at 9:00 pm. Electronically Signed   By: Fonda Field M.D.   On: 11/27/2023 21:04   Result Date: 11/27/2023 CLINICAL DATA:  Respiratory failure EXAM: PORTABLE CHEST 1 VIEW COMPARISON:  11/27/2023, 10:09 a.m. FINDINGS: Interstitial alveolar opacities consistent with right-sided pulmonary edema. The left lung is collapsed with evidence of tension pneumothorax with displacement of mediastinal structures towards the right. Endotracheal tube tip is above the carina. Feeding tube tip extends below the diaphragm to the stomach. IMPRESSION: Left-sided tension pneumothorax. Right-sided pulmonary edema. Electronically Signed: By: Fonda Field M.D. On: 11/27/2023 20:55   VAS US  UPPER EXTREMITY VENOUS DUPLEX Result Date: 11/27/2023 UPPER VENOUS STUDY  Patient Name:  Cory Russell  Date of Exam:   11/27/2023 Medical Rec #: [id number]       Accession #:    [id number] Date of Birth: [birth year]      Patient Gender: M Patient Age:   65 years Exam Location:  [facility] Procedure:      VAS US  UPPER EXTREMITY VENOUS DUPLEX Referring Phys: TORIBIO SHARPS --------------------------------------------------------------------------------  Indications: Edema Limitations: Multiple lines to BUE. Comparison Study: No previous exams Performing Technologist: Jody Hill RVT, RDMS  Examination Guidelines: A complete evaluation includes B-mode imaging, spectral Doppler, color Doppler, and power Doppler as needed of all accessible portions of each vessel. Bilateral testing is considered an integral part of a complete examination. Limited examinations for reoccurring indications may be performed as noted.  Right Findings: +----------+------------+---------+-----------+----------+---------------------+ RIGHT     CompressiblePhasicitySpontaneousProperties       Summary        +----------+------------+---------+-----------+----------+---------------------+ IJV           Full       Yes       Yes                                    +----------+------------+---------+-----------+----------+---------------------+ Subclavian    Full       No        Yes                                    +----------+------------+---------+-----------+----------+---------------------+ Axillary      Full       Yes       Yes                                    +----------+------------+---------+-----------+----------+---------------------+ Brachial      Full  Yes       Yes                                    +----------+------------+---------+-----------+----------+---------------------+ Radial        Full                                                        +----------+------------+---------+-----------+----------+---------------------+ Ulnar         Full                                                         +----------+------------+---------+-----------+----------+---------------------+ Cephalic      None       No        No               Age indeterminate at                                                           AC/forearm       +----------+------------+---------+-----------+----------+---------------------+ Basilic       Full       Yes       Yes                                    +----------+------------+---------+-----------+----------+---------------------+  Left Findings: +----------+------------+---------+-----------+----------+---------------------+ LEFT      CompressiblePhasicitySpontaneousProperties       Summary        +----------+------------+---------+-----------+----------+---------------------+ IJV           Full       Yes       Yes                                    +----------+------------+---------+-----------+----------+---------------------+ Subclavian    Full       Yes       Yes                                    +----------+------------+---------+-----------+----------+---------------------+ Axillary      Full       Yes       Yes                                    +----------+------------+---------+-----------+----------+---------------------+ Brachial      Full       Yes       Yes                                    +----------+------------+---------+-----------+----------+---------------------+ Radial  Full                                                        +----------+------------+---------+-----------+----------+---------------------+ Ulnar         Full                                                        +----------+------------+---------+-----------+----------+---------------------+ Cephalic      None       No        No               age indeterminate at                                                         AC/prox forearm     +----------+------------+---------+-----------+----------+---------------------+ Basilic       Full       Yes       Yes                                    +----------+------------+---------+-----------+----------+---------------------+  Summary:  Right: No evidence of deep vein thrombosis in the upper extremity. Findings consistent with age indeterminate superficial vein thrombosis involving the right cephalic vein.  Left: No evidence of deep vein thrombosis in the upper extremity. Findings consistent with age indeterminate superficial vein thrombosis involving the left cephalic vein.  *See table(s) above for measurements and observations.  Diagnosing physician: Lonni Gaskins MD Electronically signed by Lonni Gaskins MD on 11/27/2023 at 4:27:41 PM.    Final    ECHOCARDIOGRAM COMPLETE Result Date: 11/27/2023    ECHOCARDIOGRAM REPORT   Patient Name:   Cory Russell Date of Exam: 11/27/2023 Medical Rec #:  [id number]     Height:       67.0 in Accession #:    [id number]    Weight:       108.0 lb Date of Birth:  11/25/1990     BSA:          1.556 m Patient Age:    32 years      BP:           104/80 mmHg Patient Gender: M             HR:           107 bpm. Exam Location:  Inpatient Procedure: 2D Echo, Cardiac Doppler and Color Doppler (Both Spectral and Color            Flow Doppler were utilized during procedure). STAT ECHO Indications:    I26.02 Pulmonary embolus  History:        Patient has no prior history of Echocardiogram examinations.                 Signs/Symptoms:Shortness of Breath; Risk Factors:Hypertension.                 HIV.  Sonographer:  Ellouise Mose RDCS Referring Phys: [id number] TORIBIO JAYSON SHARPS IMPRESSIONS  1. Left ventricular ejection fraction, by estimation, is 70 to 75%. The left ventricle has hyperdynamic function. The left ventricle has no regional wall motion abnormalities. Indeterminate diastolic filling due to E-A fusion.  2. Right ventricular systolic function is low normal. The  right ventricular size is moderately enlarged. Tricuspid regurgitation signal is inadequate for assessing PA pressure.  3. The mitral valve is normal in structure. Trivial mitral valve regurgitation. No evidence of mitral stenosis.  4. The aortic valve is tricuspid. Aortic valve regurgitation is not visualized. No aortic stenosis is present.  5. The inferior vena cava is dilated in size with <50% respiratory variability, suggesting right atrial pressure of 15 mmHg. Comparison(s): No prior Echocardiogram. FINDINGS  Left Ventricle: Left ventricular ejection fraction, by estimation, is 70 to 75%. The left ventricle has hyperdynamic function. The left ventricle has no regional wall motion abnormalities. The left ventricular internal cavity size was normal in size. There is no left ventricular hypertrophy. Indeterminate diastolic filling due to E-A fusion. Right Ventricle: The right ventricular size is moderately enlarged. No increase in right ventricular wall thickness. Right ventricular systolic function is low normal. Tricuspid regurgitation signal is inadequate for assessing PA pressure. Left Atrium: Left atrial size was normal in size. Right Atrium: Right atrial size was normal in size. Pericardium: There is no evidence of pericardial effusion. Mitral Valve: The mitral valve is normal in structure. Trivial mitral valve regurgitation. No evidence of mitral valve stenosis. Tricuspid Valve: The tricuspid valve is normal in structure. Tricuspid valve regurgitation is trivial. No evidence of tricuspid stenosis. Aortic Valve: The aortic valve is tricuspid. Aortic valve regurgitation is not visualized. No aortic stenosis is present. Pulmonic Valve: The pulmonic valve was normal in structure. Pulmonic valve regurgitation is trivial. No evidence of pulmonic stenosis. Aorta: The aortic root and ascending aorta are structurally normal, with no evidence of dilitation. Venous: The inferior vena cava is dilated in size with less  than 50% respiratory variability, suggesting right atrial pressure of 15 mmHg. IAS/Shunts: There is right bowing of the interatrial septum, suggestive of elevated left atrial pressure. The interatrial septum appears to be lipomatous. No atrial level shunt detected by color flow Doppler.  LEFT VENTRICLE PLAX 2D LVIDd:         4.70 cm      Diastology LVIDs:         2.60 cm      LV e' medial:    10.90 cm/s LV PW:         1.05 cm      LV E/e' medial:  10.9 LV IVS:        1.05 cm      LV e' lateral:   14.20 cm/s LVOT diam:     2.20 cm      LV E/e' lateral: 8.4 LV SV:         104 LV SV Index:   67 LVOT Area:     3.80 cm  LV Volumes (MOD) LV vol d, MOD A2C: 121.0 ml LV vol d, MOD A4C: 97.8 ml LV vol s, MOD A2C: 33.9 ml LV vol s, MOD A4C: 28.5 ml LV SV MOD A2C:     87.1 ml LV SV MOD A4C:     97.8 ml LV SV MOD BP:      80.8 ml RIGHT VENTRICLE             IVC RV S prime:  15.30 cm/s  IVC diam: 2.10 cm TAPSE (M-mode): 2.9 cm LEFT ATRIUM             Index        RIGHT ATRIUM           Index LA diam:        3.10 cm 1.99 cm/m   RA Area:     11.60 cm LA Vol (A2C):   16.2 ml 10.41 ml/m  RA Volume:   23.70 ml  15.23 ml/m LA Vol (A4C):   28.4 ml 18.25 ml/m LA Biplane Vol: 23.2 ml 14.91 ml/m  AORTIC VALVE LVOT Vmax:   99.45 cm/s LVOT Vmean:  118.000 cm/s LVOT VTI:    0.273 m  AORTA Ao Root diam: 3.30 cm Ao Asc diam:  3.10 cm MITRAL VALVE MV Area (PHT): 5.13 cm     SHUNTS MV Decel Time: 148 msec     Systemic VTI:  0.27 m MV E velocity: 119.00 cm/s  Systemic Diam: 2.20 cm MV A velocity: 107.00 cm/s MV E/A ratio:  1.11 Morene Brownie Electronically signed by Morene Brownie Signature Date/Time: 11/27/2023/2:26:38 PM    Final     I spent 51 minutes involved in face-to-face and non-face-to-face activities for this patient on the day of the visit. Professional time spent includes the following activities: Preparing to see the patient (review of tests), Obtaining and reviewing separately obtained history (admission/discharge  record), Performing a medically appropriate examination and evaluation , Ordering medications/labs, referring and communicating with other health care professionals, Documenting clinical information in the EMR, Independently interpreting results (not separately reported), Communicating results to the patient/family/caregiver, Counseling and educating the patient/family/caregiver and Care coordination (not separately reported).   Plan d/w requesting provider as well as ID pharm D  Of note, portions of this note may have been created with voice recognition software. While this note has been edited for accuracy, occasional wrong-word or 'sound-a-like' substitutions may have occurred due to the inherent limitations of voice recognition software.   Electronically signed by:   Annalee Orem, MD Infectious Disease Physician [facility] Pager: [phone]

## 2023-11-28 NOTE — Progress Notes (Addendum)
 Nutrition Follow-up  DOCUMENTATION CODES:   Severe malnutrition in context of chronic illness, Underweight  INTERVENTION:    Tube Feeding via Cortrak:  Current goal of Vital High Protein at 40 ml/hr does not meet nutritional needs Change to Nepro for now given significant hyperkalemia (noted IV bactrim  likely culprit per ID). Further TF adjustments pending tolerance, electrolytes etc.   Current TF goal: Nepro at 45 ml/hr with Pro-Source TF20 60 mL Provides 2104 kcals, 127 g of protein and 790 mL of free water  If continues without BM, recommend increasing bowel regimen   NUTRITION DIAGNOSIS:   Severe Malnutrition related to chronic illness (HIV) as evidenced by severe muscle depletion, severe fat depletion.  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  6/04 Admitted 6/22 Intubated, TF initiated at 10, goal 40 6/23 Gastric Cortrak placed, Chest tube overnight for tension pneumothorax  Pt remains on vent support, nimbex OFF this AM, iNO stopped yesterday. Off levophed Propofol  at 20.7 ml/hr (547 kcals at current rate over 24 hours  Noted hyperkalemia and hyponatremia persists. TF held overnight per RN due to hyperkalemia. Of note, pt has been experiencing hyperkalemia throughout admission with at K+ >6.0 on 6/16  Noted IV bactrim  discontinued yesterday per I&D with a change to different antibiotic regimen, per MD,  likely  cause of hyperkalemia and hyponatremia  Pt had been on Vital High Protein at 30 ml/hr (provides about 26 mEq (1008 mg) of K+.    Noted +constipation with last BM 6/20  Labs: Sodium 131 (L) Potassium 6.2 (H) Serum Glucose 104 CBGs 110-189 BUN 32 Creatinine 1.25 Phosphorus 3.9 (wdl) Magnesium 2.9 (wdl)  Meds:  Lasix  x 1 Ss novolog Colace BID Lokelma  Solumedrol MVI with Minerals Miralax  daily   Diet Order:   Diet Order             Diet NPO time specified  Diet effective now                   EDUCATION NEEDS:   No education needs have been identified at this time  Skin:  Skin Assessment: Reviewed RN Assessment  Last BM:  6/20  Height:   Ht Readings from Last 1 Encounters:  11/08/23 5' 7 (1.702 m)    Weight:   Wt Readings from Last 1 Encounters:  11/28/23 50.7 kg    BMI:  Body mass index is 17.51 kg/m.  Estimated Nutritional Needs:   Kcal:  1800-2200  Protein:  100-115g  Fluid:  >1.8 L/day   Betsey Finger MS, RDN, LDN, CNSC Registered Dietitian 3 Clinical Nutrition RD Inpatient Contact Info in Amion

## 2023-11-28 NOTE — Progress Notes (Signed)
 NAME:  Cory Russell, MRN:  [id number], DOB:  [birth year], LOS: 20 ADMISSION DATE:  11/08/2023, CONSULTATION DATE:  11/28/2023 REFERRING MD:  Claudene Toribio BROCKS, MD, CHIEF COMPLAINT:  respiratory failure   History of Present Illness:  Cory Russell is a 33 y.o. man with past medical history of HIV/AIDS, CD4<35 on admission, not on ARVs. Presents with seizure like activity 11 days ago and found to have sepsis from pneumonia.  He notes symptoms started about 2-3 weeks ago with shortness of breath and dry cough. He had two separate rounds of antibiotics visiting other urgent care and ED visits. He has tried albuterol  nebulizer treatments from friends and initially found them helpful.   CT Chest shows bilateral ground glass opacities concerning for pneumoncystis pneumonia. Since being here he has been Started on empiric antibiotics with bactrim  and steroids. Also resumed ARVs during this admission. Has been persistently hypoxemic on 10LNC. Pulmonary consulted to assist with evaluation and management and for bronchoscopy.   He feels persistently short of breath, worse with exertion. He feels like chest PT is helping him. Feeling more anxious. Also having difficulty taking pills.   Pertinent  Medical History  HIV, Seizure disorder  Significant Hospital Events: Including procedures, antibiotic start and stop dates in addition to other pertinent events   6/4-admit 6/15 pulmonary consult 6/18 transient desats with worsening chest x-ray.  Prednisone  changed to IV Solu-Medrol  6/22 worsening respiratory status requiring ICU transfer and intubation 6/23 bronch for BAL per ID request. NO being weaned from 20 to 15 6/23 overnight L PTX s/p chest tube  Interim History / Subjective:  PTX overnight requiring L chest tube. CXR this AM with good re-expansion. Off NO. Vent settings this AM lowered from FiO2 70 to 50, being weaned further. ABG 7.29/88/198. Already on RR 32 and Vt 410 with MV 12 - 14 range. Nimbex to  be weaned today.  Objective    Blood pressure (!) 111/59, pulse (!) 105, temperature 97.8 F (36.6 C), temperature source Axillary, resp. rate (!) 29, height 5' 7 (1.702 m), weight 50.7 kg, SpO2 92%.    Vent Mode: PRVC FiO2 (%):  [40 %-70 %] 50 % Set Rate:  [32 bmp] 32 bmp Vt Set:  [410 mL] 410 mL PEEP:  [8 cmH20] 8 cmH20 Plateau Pressure:  [27 cmH20-29 cmH20] 28 cmH20   Intake/Output Summary (Last 24 hours) at 11/28/2023 0827 Last data filed at 11/28/2023 0800 Gross per 24 hour  Intake 2971.8 ml  Output 2740 ml  Net 231.8 ml   Filed Weights   11/26/23 0315 11/27/23 0500 11/28/23 0415  Weight: 49.3 kg 49 kg 50.7 kg   General:  critically ill appearing man lying in bed in NAD HEENT: NCAT, eyes anicteric Neuro: RASS -5, sedated/paralyzed CV: tachycardic, regular, no M/R/G PULM:  Coarse bilaterally, L chest tube in place with ongoing air leak GI: nondistended Extremities: no edema, no cyanosis   Assessment and Plan   Acute hypoxemic & hypercapnic respiratory failure with ARDS Concern for superimposed HAP vs aspiration to explain his worsening several weeks into his hospitalization - CTA 6/10 neg, LE duplex 6/23 neg. Severe Pneumocystis pneumonia Suspected IRIS New L PTX overnight 6/23 - s/p pigtail chest tube. Currently still with air leak 6/24. -LTVV;4-8cc/kg IBW with goal Pplat <30 & DP <15 -VAP prevention protocol -PAD protocol for sedation -Off NO 6/23. D/c Nimbex off today 6/24. -Continue empiric Zyvox/Cefepime - ID following -F/u on bronch from 6/23 - AFB, PCP, fungal culture, CMV DNA -con't  PJP treatment - on 6/23, ID changed Bactrim  to Clinda + Primaquine .  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 A1c 6.4 -SSI PRN -goal BG 140-180  Severe protein energy malnutrition Cachexia -TF's  Mother updated at bedside.    Best Practice Diet/type: TF DVT prophylaxis prophylactic heparin  Pressure ulcer(s): N/A GI prophylaxis: H2B Lines: arterial line Foley:  Yes, and it is still needed Code Status:  full code Last date of multidisciplinary goals of care discussion [Patient affirmed he wants full code and all life support options 6/22]  CC time: 30 min.   Sammi Gore, PA - C [facility] For pager details, please see AMION or use Epic chat  After 1900, please call [facility] for cross coverage needs 11/28/2023, 8:27 AM

## 2023-11-28 NOTE — Progress Notes (Signed)
 eLink Physician-Brief Progress Note Patient Name: Tsugio Elison DOB: [birth year] MRN: [id number]   Date of Service  11/28/2023  HPI/Events of Note  Patient with sub-optimal sedation on the ventilator.  eICU Interventions  Fentanyl  ceiling raised to 400 mcg and PRN iv Versed  added.        Alejandro Adcox U Tymeka Privette 11/28/2023, 8:55 PM

## 2023-11-28 NOTE — Progress Notes (Signed)
 Pharmacy: Antimicrobial Stewardship Note  41 YOM with HIV - noncompliant with ART regimen - CD4 <35 on 6/4 currently being treated for presumed PJP PNA given low CD4 count and fungitell >500.   The patient was started on PJP treatment on 6/4 and is currently on D#21 of treatment - which would complete a sufficient treatment course. Will add a stop date after today's doses and start dapsone on 6/25 for PJP prophylaxis while CD4 count <200.   In regards to ART - okay to continue Biktarvy  + Prezcobix  PT. Biktarvy  should be dissolved in water for PT administration. MV notably timed away from Biktarvy  administration. Rec to hold TFs 2 hours before/after Biktarvy  administration.  Ganciclovir started over the weekend by CCM for elevated CMV levels - per Dr. Dea, to continue for now  Continues on Linezolid and Cefepime for HAP coverage pending cultures. Noted BAL done yesterday with cultures pending  Plan - Continue to Clindamycin  600 mg IV every 6 hours + primaquine  30 mg daily PT thru 6/24 to complete 21d treatment duration - Start dapsone 100 mg PT once daily on 6/25 for PJP prophylaxis - Continue linezolid 600 mg PT bid - Adjust Cefepime to 2g IV every 8 hours - Continue Ganciclovir 125 mg IV every 12 hours - Continue Biktarvy  + Prezcobix  daily PT - Will monitor progression, renal function, culture/testing updates  Thank you for allowing pharmacy to be a part of this patient's care.  Almarie Lunger, PharmD, BCPS, BCIDP Infectious Diseases Clinical Pharmacist 11/28/2023 12:34 PM   **Pharmacist phone directory can now be found on [email or web address] ([email or web address]).  Listed under [facility].

## 2023-11-29 ENCOUNTER — Inpatient Hospital Stay (HOSPITAL_COMMUNITY)

## 2023-11-29 DIAGNOSIS — B59 Pneumocystosis: Secondary | ICD-10-CM | POA: Diagnosis not present

## 2023-11-29 DIAGNOSIS — E875 Hyperkalemia: Secondary | ICD-10-CM | POA: Diagnosis not present

## 2023-11-29 DIAGNOSIS — J9601 Acute respiratory failure with hypoxia: Secondary | ICD-10-CM

## 2023-11-29 DIAGNOSIS — B259 Cytomegaloviral disease, unspecified: Secondary | ICD-10-CM | POA: Diagnosis not present

## 2023-11-29 DIAGNOSIS — J80 Acute respiratory distress syndrome: Secondary | ICD-10-CM | POA: Diagnosis not present

## 2023-11-29 DIAGNOSIS — J939 Pneumothorax, unspecified: Secondary | ICD-10-CM | POA: Diagnosis not present

## 2023-11-29 DIAGNOSIS — B20 Human immunodeficiency virus [HIV] disease: Secondary | ICD-10-CM | POA: Diagnosis not present

## 2023-11-29 DIAGNOSIS — D638 Anemia in other chronic diseases classified elsewhere: Secondary | ICD-10-CM

## 2023-11-29 LAB — CBC
HCT: 21.8 % — ABNORMAL LOW (ref 39.0–52.0)
Hemoglobin: 7.4 g/dL — ABNORMAL LOW (ref 13.0–17.0)
MCH: 29.7 pg (ref 26.0–34.0)
MCHC: 33.9 g/dL (ref 30.0–36.0)
MCV: 87.6 fL (ref 80.0–100.0)
Platelets: 207 10*3/uL (ref 150–400)
RBC: 2.49 MIL/uL — ABNORMAL LOW (ref 4.22–5.81)
RDW: 17.2 % — ABNORMAL HIGH (ref 11.5–15.5)
WBC: 8.5 10*3/uL (ref 4.0–10.5)
nRBC: 0.5 % — ABNORMAL HIGH (ref 0.0–0.2)

## 2023-11-29 LAB — GLUCOSE, CAPILLARY
Glucose-Capillary: 181 mg/dL — ABNORMAL HIGH (ref 70–99)
Glucose-Capillary: 192 mg/dL — ABNORMAL HIGH (ref 70–99)
Glucose-Capillary: 192 mg/dL — ABNORMAL HIGH (ref 70–99)
Glucose-Capillary: 195 mg/dL — ABNORMAL HIGH (ref 70–99)

## 2023-11-29 LAB — BLOOD GAS, VENOUS
Acid-Base Excess: 30.7 mmol/L — ABNORMAL HIGH (ref 0.0–2.0)
Bicarbonate: 57.2 mmol/L — ABNORMAL HIGH (ref 20.0–28.0)
Drawn by: 64724
O2 Saturation: 99.7 %
Patient temperature: 37.3
pCO2, Ven: 71 mmHg (ref 44–60)
pH, Ven: 7.52 — ABNORMAL HIGH (ref 7.25–7.43)
pO2, Ven: 107 mmHg — ABNORMAL HIGH (ref 32–45)

## 2023-11-29 LAB — VIRUS CULTURE

## 2023-11-29 LAB — BASIC METABOLIC PANEL WITH GFR
Anion gap: 11 (ref 5–15)
BUN: 38 mg/dL — ABNORMAL HIGH (ref 6–20)
CO2: 39 mmol/L — ABNORMAL HIGH (ref 22–32)
Calcium: 8.3 mg/dL — ABNORMAL LOW (ref 8.9–10.3)
Chloride: 86 mmol/L — ABNORMAL LOW (ref 98–111)
Creatinine, Ser: 0.89 mg/dL (ref 0.61–1.24)
GFR, Estimated: >60 mL/min (ref >60)
Glucose, Bld: 184 mg/dL — ABNORMAL HIGH (ref 70–99)
Potassium: 4.8 mmol/L (ref 3.5–5.1)
Sodium: 136 mmol/L (ref 135–145)

## 2023-11-29 LAB — ECHOCARDIOGRAM LIMITED BUBBLE STUDY: S' Lateral: 2.9 cm

## 2023-11-29 LAB — POTASSIUM
Potassium: 4.8 mmol/L (ref 3.5–5.1)
Potassium: 4.6 mmol/L (ref 3.5–5.1)
Potassium: 4.2 mmol/L (ref 3.5–5.1)
Potassium: 4.2 mmol/L (ref 3.5–5.1)

## 2023-11-29 LAB — T-HELPER CELLS (CD4) COUNT (NOT AT ARMC)
CD4 % Helper T Cell: 8 % — ABNORMAL LOW (ref 33–65)
CD4 T Cell Abs: <35 /uL — ABNORMAL LOW (ref 400–1790)

## 2023-11-29 LAB — PHOSPHORUS: Phosphorus: 3.1 mg/dL (ref 2.5–4.6)

## 2023-11-29 LAB — CYTOMEGALOVIRUS DNA, QUANTITATIVE REAL-TIME PCR, PLASMA
CMV DNA Quant: 67200 [IU]/mL
Log10 CMV Qn DNA Pl: 4.827 {Log_IU}/mL

## 2023-11-29 LAB — AMMONIA: Ammonia: 62 umol/L — ABNORMAL HIGH (ref 9–35)

## 2023-11-29 LAB — SEDIMENTATION RATE: Sed Rate: 80 mm/h — ABNORMAL HIGH (ref 0–16)

## 2023-11-29 LAB — MAGNESIUM: Magnesium: 2.7 mg/dL — ABNORMAL HIGH (ref 1.7–2.4)

## 2023-11-29 MED ORDER — ROCURONIUM BROMIDE 10 MG/ML (PF) SYRINGE: 100.0000 mg | PREFILLED_SYRINGE | Freq: Once | INTRAVENOUS | Status: AC

## 2023-11-29 MED ORDER — DEXMEDETOMIDINE HCL IN NACL 400 MCG/100ML IV SOLN
0.0000 ug/kg/h | INTRAVENOUS | Status: DC
  Administered 2023-11-29: 0.4 ug/kg/h via INTRAVENOUS
  Administered 2023-11-29: 0.8 ug/kg/h via INTRAVENOUS
  Administered 2023-11-30: 0.9 ug/kg/h via INTRAVENOUS
  Administered 2023-11-30 – 2023-12-01 (×3): 1 ug/kg/h via INTRAVENOUS
  Administered 2023-12-01: 1.2 ug/kg/h via INTRAVENOUS
  Administered 2023-12-01: 1 ug/kg/h via INTRAVENOUS
  Administered 2023-12-02 (×2): 1.2 ug/kg/h via INTRAVENOUS
  Filled 2023-11-29 (×5): qty 100
  Filled 2023-11-29: qty 200
  Filled 2023-11-29 (×4): qty 100

## 2023-11-29 MED ORDER — ROCURONIUM BROMIDE 10 MG/ML (PF) SYRINGE
PREFILLED_SYRINGE | INTRAVENOUS | Status: AC
  Administered 2023-11-29: 100 mg via INTRAVENOUS
  Filled 2023-11-29: qty 10

## 2023-11-29 MED ORDER — TRANEXAMIC ACID FOR INHALATION: 500.0000 mg | RESPIRATORY_TRACT | Status: DC | PRN

## 2023-11-29 MED ORDER — SODIUM CHLORIDE 0.9 % IV SOLN
250.0000 mg | Freq: Two times a day (BID) | INTRAVENOUS | Status: DC
  Administered 2023-11-29 – 2023-12-08 (×18): 250 mg via INTRAVENOUS
  Filled 2023-11-29 (×20): qty 5

## 2023-11-29 MED ORDER — INSULIN ASPART 100 UNIT/ML IJ SOLN
0.0000 [IU] | INTRAMUSCULAR | Status: DC
  Administered 2023-11-29: 8 [IU] via SUBCUTANEOUS
  Administered 2023-11-29 – 2023-11-30 (×5): 3 [IU] via SUBCUTANEOUS

## 2023-11-29 MED ORDER — CISATRACURIUM BOLUS VIA INFUSION: 10.0000 mg | Freq: Once | INTRAVENOUS | Status: DC

## 2023-11-29 MED ORDER — MIDAZOLAM HCL 2 MG/2ML IJ SOLN
2.0000 mg | Freq: Once | INTRAMUSCULAR | Status: AC
  Administered 2023-11-29: 2 mg via INTRAVENOUS
  Filled 2023-11-29: qty 2

## 2023-11-29 MED ORDER — CISATRACURIUM BESYLATE 20 MG/10ML IV SOLN
0.2000 mg/kg | Freq: Once | INTRAVENOUS | Status: AC
  Administered 2023-11-29: 10 mg via INTRAVENOUS
  Filled 2023-11-29: qty 10

## 2023-11-29 MED ORDER — FENTANYL BOLUS VIA INFUSION
100.0000 ug | Freq: Once | INTRAVENOUS | Status: AC
  Administered 2023-11-30: 100 ug via INTRAVENOUS
  Filled 2023-11-29: qty 100

## 2023-11-29 MED ORDER — FUROSEMIDE 10 MG/ML IJ SOLN
40.0000 mg | Freq: Two times a day (BID) | INTRAMUSCULAR | Status: AC
  Administered 2023-11-29 (×2): 40 mg via INTRAVENOUS
  Filled 2023-11-29 (×2): qty 4

## 2023-11-29 NOTE — Progress Notes (Signed)
 The RN was turning and cleaning  the patient, And the patient begin to desat, Patient went into the 80s, He was given some O2 breaths and his spO2 was still low,  RT Disconnected patient from the vent and manually bagged patient with a peep valve, Patient SpO2 was 86. The MD walked by to assess , Pt was placed back on vent and we increased his FiO2 to 100% and increased his peep to 10. Patient SpO2 went to 93, RT then  decreased his peep back down to 8  SAT holding 97%

## 2023-11-29 NOTE — Progress Notes (Signed)
 PHARMACY NOTE:   RENAL DOSAGE ADJUSTMENT  Ganciclovir  Current dosage:  2.5mg  IV q12h  Indication: CMV   Renal Function:  Estimated Creatinine Clearance: 83.5 mL/min (by C-G formula based on SCr of 0.89 mg/dL). []      On intermittent HD, scheduled: []      On CRRT    Antimicrobial dosage has been changed to:  5mg  IV q12h  Additional comments:   Thank you for allowing pharmacy to be a part of this patient's care.  Prentice Poisson, PharmD Clinical Pharmacist **Pharmacist phone directory can now be found on [email or web address] ([email or web address]).  Listed under [facility].

## 2023-11-29 NOTE — Progress Notes (Addendum)
 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 Still with increased oxygen requirements   Assessment 33 year old male with prior history of HIV/AIDS, noncompliance with medications, epilepsy admitted with subacute cough and dyspnea, also seizure.  Currently being treated for possible severe PJP PNA with concerns for IRIS, in the setting of rapid decline of viral load with initiation of ART.   Patient had severe worsening of respite status on 6/22 requiring ICU transfer and intubation.   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 No visual,  neurologic or GI symptoms prior to intubation. Uncommon to have CMV pneumonitis and will need biopsy for definitive diagnosis but considered high risk, his CMV VL from BAL on 6/23 39K,  on preemptive ganciclovir   # AIDs - h/o poor compliance, VL quickly went down from 306K to 640 this admission in approx 10 days after initiation of ART with concerns for IRIS as above. CD4 still less than 35( 8%)  # Hyponatremia/hyperkalemia - resolved   Recommendations - PCCM managing IV steroids for concerns for IRIS  - complete 7 days course of linezolid and cefepime pending BAL cultures including viral cultures  - Daily CBC and BMP - Fu blastomyces ag and histoplasma ag in urine ordered( not collected) - Will plan to repeat CMV later this week. Fu HIV RNA - Standard isolation precautions  Thank you for the consult. Please page with pertinent questions or concerns.  ______________________________________________________________________ Subjective patient seen and examined at the bedside. Intubated and sedated, Mother at bedside.   Vitals BP 129/85   Pulse (!) 111   Temp 98.1 F (36.7 C) (Axillary)   Resp (!) 32   Ht 5' 7 (1.702 m)   Wt 50 kg   SpO2 97%   BMI 17.26 kg/m     Physical Exam Constitutional:  adult male orotracheally intubated, opens eyes and follows    Comments: On fentanyl , precedex  Cardiovascular:     Rate and Rhythm: Normal rate and regular rhythm.     Heart sounds:   Pulmonary:     Effort: Pulmonary effort normal; on vent, left CT    Comments: coarse mechanical breath sounds b/l, Fio2 70%   Abdominal:     Palpations: Abdomen is non distended    Tenderness:  Musculoskeletal:        General: No swelling or tenderness in peripheral joints   Skin:    Comments: no rashes   Neurological:     General: sedated, pupils bilaterally symmetrical  Pertinent Microbiology Results for orders placed or performed during the hospital encounter of 11/08/23  Culture, blood  (Routine X 2) w Reflex to ID Panel     Status: None   Collection Time: 11/08/23  4:18 AM   Specimen: BLOOD  Result Value Ref Range Status   Specimen Description BLOOD BLOOD RIGHT ARM  Final   Special Requests   Final    BOTTLES DRAWN AEROBIC AND ANAEROBIC Blood Culture adequate volume   Culture   Final    NO GROWTH 5 DAYS Performed at [facility], [address] [place], [place] [address]    Report Status 11/13/2023 FINAL  Final  Culture, blood (Routine X 2) w Reflex to ID Panel     Status: None   Collection Time: 11/08/23  4:22 AM   Specimen: BLOOD  Result Value Ref Range Status   Specimen Description BLOOD BLOOD RIGHT ARM  Final   Special Requests   Final    BOTTLES DRAWN AEROBIC AND ANAEROBIC Blood Culture adequate volume   Culture   Final    NO GROWTH 5 DAYS Performed at [facility], [address], [place], [place] [address]    Report Status 11/13/2023 FINAL  Final  Respiratory (~20 pathogens) panel by PCR     Status: None   Collection Time: 11/08/23  8:42 AM   Specimen: Nasopharyngeal Swab; Respiratory  Result Value Ref Range Status   Adenovirus NOT DETECTED NOT DETECTED Final   Coronavirus 229E NOT DETECTED NOT DETECTED Final    Comment: (NOTE) The Coronavirus on the Respiratory Panel, DOES NOT test for the novel  Coronavirus (2019 nCoV)    Coronavirus HKU1 NOT DETECTED NOT DETECTED Final   Coronavirus NL63 NOT DETECTED NOT DETECTED Final   Coronavirus OC43 NOT DETECTED NOT DETECTED Final   Metapneumovirus NOT DETECTED NOT DETECTED Final   Rhinovirus / Enterovirus NOT DETECTED NOT DETECTED Final   Influenza A NOT DETECTED NOT DETECTED Final   Influenza B NOT DETECTED NOT DETECTED Final   Parainfluenza Virus 1 NOT DETECTED NOT DETECTED Final   Parainfluenza Virus 2 NOT DETECTED NOT DETECTED Final   Parainfluenza Virus 3 NOT DETECTED NOT DETECTED Final   Parainfluenza Virus 4 NOT DETECTED NOT DETECTED Final   Respiratory Syncytial Virus NOT DETECTED  NOT DETECTED Final   Bordetella pertussis NOT DETECTED NOT DETECTED Final   Bordetella Parapertussis NOT DETECTED NOT DETECTED Final   Chlamydophila pneumoniae NOT DETECTED NOT DETECTED Final   Mycoplasma pneumoniae NOT DETECTED NOT DETECTED Final    Comment: Performed at [facility], [address] [place], [place] [address]  Expectorated Sputum Assessment w Gram Stain, Rflx to Resp Cult     Status: None   Collection Time: 11/17/23  2:20 PM   Specimen: Expectorated Sputum  Result Value Ref Range Status   Specimen Description EXPECTORATED SPUTUM  Final   Special Requests Immunocompromised  Final   Sputum evaluation   Final    THIS SPECIMEN IS ACCEPTABLE FOR SPUTUM CULTURE Performed at [facility], [address] [place], [place] [address]    Report Status 11/17/2023 FINAL  Final  Culture, Respiratory w Gram Stain     Status: None   Collection Time: 11/17/23  2:20 PM  Result Value Ref Range Status   Specimen Description EXPECTORATED SPUTUM  Final   Special Requests Immunocompromised Reflexed from [id number]  Final   Gram Stain NO WBC SEEN FEW GRAM POSITIVE COCCI IN PAIRS   Final   Culture   Final    MODERATE Normal respiratory flora-no Staph aureus or Pseudomonas seen Performed at [facility], [address] [place], [place] [address]    Report Status 11/19/2023 FINAL  Final  MRSA Next Gen by PCR, Nasal     Status: Abnormal   Collection Time: 11/25/23 11:35 PM   Specimen: Nasal Mucosa; Nasal Swab  Result Value Ref Range Status   MRSA by PCR Next Gen DETECTED (A) NOT DETECTED Final    Comment: RESULT CALLED TO, READ BACK BY AND VERIFIED WITH: SHAFFNER,P RN 11/26/2023 AT 0218 SKEEN,P (NOTE) The GeneXpert MRSA Assay (FDA approved for NASAL specimens only), is one component of a comprehensive MRSA colonization surveillance program. It is not intended to diagnose MRSA infection nor to guide or monitor treatment for MRSA infections. Test performance is not FDA  approved in patients less than 73 years old. Performed at [facility], [address] [place], [place] [address]   Culture, Respiratory w Gram Stain     Status: None (Preliminary result)   Collection Time: 11/27/23  9:27 AM   Specimen: Bronchoalveolar Lavage; Respiratory  Result Value Ref Range Status   Specimen Description BRONCHIAL ALVEOLAR LAVAGE  Final   Special Requests NONE  Final   Gram Stain NO WBC SEEN NO ORGANISMS SEEN   Final   Culture   Final    NO GROWTH 1 DAY Performed at [facility], [address], [place], [place] [address]    Report Status PENDING  Incomplete  Acid Fast Smear (AFB)     Status: None   Collection Time: 11/27/23  9:27 AM   Specimen: Bronchoalveolar Lavage; Respiratory  Result Value Ref Range Status   AFB Specimen Processing Concentration  Final   Acid Fast Smear Negative  Final    Comment: (NOTE) Performed At: [facility] [address] [place], [place] [id number] Jennette Shorter MD Ey:[id number]    Source (AFB) BRONCHIAL ALVEOLAR LAVAGE  Final    Comment: Performed at [facility], [address] [place], [place] [address]  Virus culture     Status: None   Collection Time: 11/27/23  9:27 AM   Specimen: Bronchoalveolar Lavage  Result Value Ref Range Status   Viral Culture Comment  Final    Comment: (NOTE) Preliminary Report: No virus isolated at 24 hours. Next report to follow after 4 days. Performed At: [facility] [address] [place], [place] [id number] Jennette Shorter MD Ey:[id number]    Source of Sample BRONCHIAL ALVEOLAR LAVAGE  Final    Comment: Performed at [facility], [address], [place], [place] [address]   Pertinent Lab.    Latest Ref Rng & Units 11/29/2023    3:36 AM 11/28/2023    4:10 AM 11/28/2023   12:04 AM  CBC  WBC 4.0 - 10.5 K/uL 8.5  6.9    Hemoglobin 13.0 - 17.0 g/dL 7.4  7.1  6.8   Hematocrit 39.0 - 52.0 % 21.8  21.5  20.0   Platelets 150 - 400 K/uL 207  191         Latest Ref Rng & Units 11/29/2023   10:38 AM 11/29/2023    3:36 AM 11/29/2023  3:35 AM  CMP  Glucose 70 - 99 mg/dL  815    BUN 6 - 20 mg/dL  38    Creatinine 9.38 - 1.24 mg/dL  9.10    Sodium 864 - 854 mmol/L  136    Potassium 3.5 - 5.1 mmol/L 4.6  4.8  4.8   Chloride 98 - 111 mmol/L  86    CO2 22 - 32 mmol/L  39    Calcium  8.9 - 10.3 mg/dL  8.3     Pertinent Imaging today Plain films and CT images have been personally visualized and interpreted; radiology reports have been reviewed. Decision making incorporated into the Impression /   DG Chest Port 1 View Result Date: 11/29/2023 CLINICAL DATA:  Pneumothorax. EXAM: PORTABLE CHEST 1 VIEW COMPARISON:  Chest radiograph dated 11/28/2023. FINDINGS: Endotracheal tube approximately 6 cm above the carina. Enteric tube with tip in the distal stomach. Diffuse bilateral interstitial densities. No large pleural effusion. Evaluation for pneumothorax is limited due to left chest wall emphysema and portable technique. There is however linear lucency along the left lateral border of the cardiac silhouette suggestive of a small left pneumothorax. A left-sided chest tube with tip over the left lung base. Mild cardiomegaly. No acute osseous pathology. IMPRESSION: 1. Support apparatus as above. 2. Small left pneumothorax. 3. Diffuse bilateral interstitial densities. Electronically Signed   By: Vanetta Chou M.D.   On: 11/29/2023 10:32    I spent 51 minutes involved in face-to-face and non-face-to-face activities for this patient on the day of the visit. Professional time spent includes the following activities: Preparing to see the patient (review of tests), Obtaining and reviewing separately obtained history (admission/discharge record), Performing a medically appropriate examination and evaluation , Ordering medications/labs, referring and communicating with other health care professionals, Documenting clinical information in the EMR, Independently  interpreting results (not separately reported), Communicating results to the patient/family/caregiver, Counseling and educating the patient/family/caregiver and Care coordination (not separately reported).   Plan d/w requesting provider as well as ID pharm D  Of note, portions of this note may have been created with voice recognition software. While this note has been edited for accuracy, occasional wrong-word or 'sound-a-like' substitutions may have occurred due to the inherent limitations of voice recognition software.   Electronically signed by:   Annalee Orem, MD Infectious Disease Physician [facility] for Infectious Disease Pager: [phone]

## 2023-11-29 NOTE — Plan of Care (Signed)
  Problem: Clinical Measurements: Goal: Ability to maintain clinical measurements within normal limits will improve Outcome: Progressing Goal: Will remain free from infection Outcome: Progressing Goal: Diagnostic test results will improve Outcome: Progressing Goal: Respiratory complications will improve Outcome: Not Progressing Goal: Cardiovascular complication will be avoided Outcome: Progressing   Problem: Activity: Goal: Risk for activity intolerance will decrease Outcome: Not Progressing   Problem: Nutrition: Goal: Adequate nutrition will be maintained Outcome: Progressing   Problem: Coping: Goal: Level of anxiety will decrease Outcome: Not Progressing   Problem: Elimination: Goal: Will not experience complications related to bowel motility Outcome: Progressing Goal: Will not experience complications related to urinary retention Outcome: Progressing   Problem: Safety: Goal: Ability to remain free from injury will improve Outcome: Progressing   Problem: Skin Integrity: Goal: Risk for impaired skin integrity will decrease Outcome: Progressing

## 2023-11-29 NOTE — Progress Notes (Signed)
 RT advance airway, to 25cm, observed at 23cm

## 2023-11-29 NOTE — Progress Notes (Signed)
 PCCM Interval Progress Note:  Called to bedside by RT for increasing FiO2 requirements, high peak pressures (45) and vent dyssynchrony. Vent settings adjusted to SIMV with RR 18, tolerating well. CXR improved from prior post-diuresis without other significant changes.  Will continue to monitor. If ongoing vent noncompliance, may need to consider paralytic.  Corean CHRISTELLA Kae Lauman, PA-C [facility] 11/29/23 2:56 PM  Please see [email or web address] for pager details.  From 7A-7P if no response, please call [phone] After hours, please call ELink [phone]

## 2023-11-29 NOTE — TOC Initial Note (Signed)
 Transition of Care [facility]) - Initial/Assessment Note    Patient Details  Name: Cory Russell MRN: [id number] Date of Birth: [birth year]  Transition of Care [facility]) CM/SW Contact:    Justina Delcia Czar, RN Phone Number: [phone] 11/29/2023, 8:33 AM  Clinical Narrative:                  TOC CM spoke to pt's mother at bedside. Pt lives at home with mother. Mother states pt works part-time. Pt has a nebulizer machine at home given to him by family.  Will continue to follow for dc needs.    Expected Discharge Plan: Home/Self Care Barriers to Discharge: Continued Medical Work up   Patient Goals and CMS Choice Patient states their goals for this hospitalization and ongoing recovery are:: Plan for home once stable.   Choice offered to / list presented to : NA      Expected Discharge Plan and Services   Discharge Planning Services: CM Consult Post Acute Care Choice: NA Living arrangements for the past 2 months: Single Family Home                   DME Agency: NA       HH Arranged: NA          Prior Living Arrangements/Services Living arrangements for the past 2 months: Single Family Home Lives with:: Parents Patient language and need for interpreter reviewed:: Yes Do you feel safe going back to the place where you live?: Yes      Need for Family Participation in Patient Care: No (Comment) Care giver support system in place?: No (comment)   Criminal Activity/Legal Involvement Pertinent to Current Situation/Hospitalization: No - Comment as needed  Activities of Daily Living   ADL Screening (condition at time of admission) Independently performs ADLs?: Yes (appropriate for developmental age) Is the patient deaf or have difficulty hearing?: No Does the patient have difficulty seeing, even when wearing glasses/contacts?: No Does the patient have difficulty concentrating, remembering, or making decisions?: No  Permission Sought/Granted Permission sought to share  information with : Family Supports, Case Manager                Emotional Assessment Appearance:: Appears stated age Attitude/Demeanor/Rapport: Intubated (Following Commands or Not Following Commands) Affect (typically observed): Appropriate Orientation: : Oriented to Self, Oriented to Place, Oriented to  Time, Oriented to Situation Alcohol / Substance Use: Not Applicable Psych Involvement: No (comment)  Admission diagnosis:  Seizure-like activity (HCC) [R56.9] CAP (community acquired pneumonia) [J18.9] Patient Active Problem List   Diagnosis Date Noted   IRIS (immune reconstitution inflammatory syndrome) (HCC) 11/28/2023   Healthcare associated bacterial pneumonia 11/27/2023   Pneumothorax 11/27/2023   Hypoxia 11/23/2023   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   Condyloma acuminata 11/16/2023   Acute hypoxic respiratory failure (HCC) 11/16/2023   Hyponatremia 11/16/2023   Hyperkalemia 11/16/2023   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) 11/16/2023   AIDS (acquired immune deficiency syndrome) (HCC) 11/10/2023   Seizure disorder (HCC) 11/08/2023   Severe sepsis due to PJP pneumonia 11/08/2023   Protein-calorie malnutrition, severe 11/08/2023   Seizure (HCC) 02/28/2023   Nausea 01/05/2023   HIV (human immunodeficiency virus infection) (HCC)    Hypertension    Rectal mass 09/30/2019   History of syphilis 09/30/2019   Hemorrhoids 01/23/2019   Routine screening for STI (sexually transmitted infection) 11/27/2018   Healthcare maintenance 06/25/2018   Depression 01/26/2015   Encounter for  long-term (current) use of medications 08/28/2014   Poor appetite 08/28/2014   Human immunodeficiency virus (HIV) disease (HCC) 12/20/2013   PCP:  Luiz Channel, MD Pharmacy:   [facility] [phone] - [place], [place] - [address] AT [facility] [address] [place] [address] Phone: [phone]  Fax: [phone]  [place] [facility] [address] [place] [address] Phone: [phone] Fax: [phone]  [facility] [address] [place] [address] Phone: [phone] Fax: [phone]     Social Drivers of Health (SDOH) Social History: SDOH Screenings   Food Insecurity: No Food Insecurity (11/08/2023)  Housing: Low Risk  (11/08/2023)  Transportation Needs: No Transportation Needs (11/08/2023)  Utilities: Not At Risk (11/08/2023)  Depression (PHQ2-9): Low Risk  (04/12/2023)  Financial Resource Strain: Low Risk  (02/27/2023)   Received from [facility]  Tobacco Use: Low Risk  (11/08/2023)   SDOH Interventions:     Readmission Risk Interventions     No data to display

## 2023-11-29 NOTE — Progress Notes (Signed)
*  PRELIMINARY RESULTS* Echocardiogram 2D Echocardiogram has been performed. Bubble study attempted 2x   Koleen KANDICE Popper 11/29/2023, 10:36 AM

## 2023-11-29 NOTE — Progress Notes (Signed)
   11/29/23 [id number]  Spiritual Encounters  Care provided to: Family  Reason for visit Routine spiritual support  OnCall Visit No  Interventions  Spiritual Care Interventions Made Established relationship of care and support;Prayer   Chaplain stopped by to offer a word of prayer. The Pt. mother was present in the room. I came to the bedside and led a brief prayer of comfort and peace. Mother expressed appreciation for the visit and the spiritual support provided.

## 2023-11-29 NOTE — Progress Notes (Signed)
 NAME:  Cory Russell, MRN:  [id number], DOB:  1991-03-07, LOS: 21 ADMISSION DATE:  11/08/2023, CONSULTATION DATE: 11/19/2023 REFERRING MD:  Jonel - TRH, CHIEF COMPLAINT:  Respiratory failure   History of Present Illness:  Rayburn Mundis is a 33 y.o. man with past medical history of HIV/AIDS, CD4<35 on admission, not on ARVs. Presents with seizure like activity 11 days ago and found to have sepsis from pneumonia.  He notes symptoms started about 2-3 weeks ago with shortness of breath and dry cough. He had two separate rounds of antibiotics visiting other urgent care and ED visits. He has tried albuterol  nebulizer treatments from friends and initially found them helpful. CT Chest shows bilateral ground glass opacities concerning for pneumoncystis pneumonia. Since being here he has been Started on empiric antibiotics with bactrim  and steroids. Also resumed ARVs during this admission. Has been persistently hypoxemic on 10LNC. Pulmonary consulted to assist with evaluation and management and for bronchoscopy.   He feels persistently short of breath, worse with exertion. He feels like chest PT is helping him. Feeling more anxious. Also having difficulty taking pills.   Pertinent Medical History:  HIV, Seizure disorder  Significant Hospital Events: Including procedures, antibiotic start and stop dates in addition to other pertinent events   6/04 Admit 6/15 Pulmonary consult 6/18 Transient desats with worsening chest x-ray.  Prednisone  changed to IV Solu-Medrol  6/22 Worsening respiratory status requiring ICU transfer and intubation 6/23 Bronch for BAL per ID request. NO being weaned from 20 to 15 6/23 Overnight L PTX s/p chest tube  Interim History / Subjective:  Subobtimally sedated overnight, Fentanyl  ceiling increased + PRN Versed  Lost A-line overnight This AM, sedation weaned; would track with eyes/open eyes to voice, not following other commands Weaning on PSV 10/8, but with lightened sedation  became dyssynchronous with vent Required return to PRVC and FiO2 increase to 80%, sedation increased CXR with diffuse bilateral interstitial densities, small L PTX (pigtail in place) Push diuresis today Lasix  x 2 Limited Echo with bubble  Objective:   Blood pressure 128/84, pulse (!) 109, temperature 98.2 F (36.8 C), temperature source Axillary, resp. rate 20, height 5' 7 (1.702 m), weight 50 kg, SpO2 93%.    Vent Mode: PRVC FiO2 (%):  [50 %-70 %] 70 % Set Rate:  [32 bmp] 32 bmp Vt Set:  [410 mL] 410 mL PEEP:  [8 cmH20] 8 cmH20 Plateau Pressure:  [17 cmH20-34 cmH20] 29 cmH20   Intake/Output Summary (Last 24 hours) at 11/29/2023 0720 Last data filed at 11/29/2023 0600 Gross per 24 hour  Intake 2997.21 ml  Output 2145 ml  Net 852.21 ml   Filed Weights   11/27/23 0500 11/28/23 0415 11/29/23 0418  Weight: 49 kg 50.7 kg 50 kg   Physical Examination: General: Acutely ill-appearing young man in NAD. Appears uncomfortable. HEENT: /AT, anicteric sclera, PERRL 3mm, moist mucous membranes. Copious clear oral secretions. Neuro: Intubated, sedated. Responds to noxious stimuli. Not following commands. Moves BUE spontaneously. +Corneal, +Cough, and +Gag  CV: Tachycardic, regular rhythm, +high-pitched rub heard over L chest, ?pleural versus pericardial PULM: Breathing tachypneic and mildly labored on vent (PEEP 5, FiO2 70%), +dyssynchrony noted. Lung fields coarse bilaterally. Pigtail chest tube present on L. High-pitched rub as above. GI: Soft, nontender, nondistended. Normoactive bowel sounds. Extremities: Trace bilateral symmetric LE edema noted. Skin: Warm/dry, no rashes.  Assessment and Plan:   Acute hypoxemic & hypercapnic respiratory failure with ARDS Concern for superimposed HAP vs aspiration to explain his worsening several weeks into  his hospitalization - CTA 6/10 neg, LE duplex 6/23 neg. Severe Pneumocystis pneumonia Suspected IRIS New L PTX overnight 6/23 - s/p pigtail  chest tube. Currently still with small air leak 6/25. NO discontinued 6/23, Nimbex discontinued 6/24. - Continue full vent support (4-8cc/kg IBW); goal Pplat < 30, driving pressures < 15 - Wean FiO2 for O2 sat > 90% - Daily WUA/SBT as appropriate, did not tolerate well 6/25 - VAP bundle - Bronchodilators as ordered - Continue steroids - Pulmonary hygiene - PAD protocol for sedation: Precedex, Propofol , and Fentanyl  for goal RASS -1 to -2 - Low threshold for increased sedation, may need paralytic pushes - Bronch/BAL 6/23: Cx NG, AFB smear negative; viral Cx negative, CMV quant 39,400 (11,800) - F/u AFB Cx/fungal Cx, pneumocystis smear - Broad-spectrum antibiotic coverage (cefepime/linezolid), PJP treatment (Clindamycin  + Primaquine ) - Continue ganciclovir for CMV treatment - ID following, appreciate assistance - Follow CXR - CT management per protocol - F/u Echo (Limited) with bubble, r/o shunt as etiology of increased O2 requirements  Immunocompromised due to HIV/AIDS:  CD4<35 - ID following, appreciate assistance - Continue Biktarvy , Prezcobix  - Ganciclovir as above - F/u HIV-1 RNA, CD4  Hyperkalemia due to Bactrim  - s/p temporizing measures and Bactrim  d/c 6/23 Hyponatremia  AKI - Trend BMP - Replete electrolytes as indicated - Continue Lokelma  - Monitor I&Os, diuresis as tolerated (Lasix  x 2 today, 6/25) - F/u urine studies - Avoid nephrotoxic agents as able - Ensure adequate renal perfusion  Chronic anemia, likely due to HIV, acute illness - Trend H&H - Monitor for signs of active bleeding - Transfuse for Hgb < 7.0 or hemodynamically significant bleeding  Seizure Disorder - Continue Depakote  - Ativan  PRN  Hyperglycemia; pre-DM. A1c 6.4 - SSI - CBGs Q4H - Goal CBG 140-180  Severe protein energy malnutrition Cachexia - Continue TF - Appreciate Nutrition/RD assistance  Best Practice (right click and Reselect all SmartList Selections daily)   Diet/type:  TF DVT prophylaxis prophylactic heparin  Pressure ulcer(s): N/A GI prophylaxis: H2B Lines: arterial line Foley:  Yes, and it is still needed Code Status:  full code Last date of multidisciplinary goals of care discussion [Patient affirmed he wants full code and all life support options 6/22]  Critical care time:   The patient is critically ill with multiple organ system failure and requires high complexity decision making for assessment and support, frequent evaluation and titration of therapies, advanced monitoring, review of radiographic studies and interpretation of complex data.   Critical Care Time devoted to patient care services, exclusive of separately billable procedures, described in this note is 39 minutes.  Corean CHRISTELLA Harlene Petralia, PA-C [facility] 11/29/23 7:24 AM  Please see [email or web address] for pager details.  From 7A-7P if no response, please call [phone] After hours, please call ELink [phone]

## 2023-11-29 NOTE — Progress Notes (Signed)
 eLink Physician-Brief Progress Note Patient Name: Cory Russell DOB: [birth year] MRN: [id number]   Date of Service  11/29/2023  HPI/Events of Note  Patient with ventilator dyssynchrony and desaturation despite deep sedation with Propofol  / Fentanyl  / Precedex. Nimbex gtt was recently discontinued.  eICU Interventions  Stat portable CXR to r/o pneumothorax or other unsuspected problem,  Versed  2 mg iv followed by Nimbex 10 mg iv x 1 to attempt to interrupt and reset respiratory drive.        Tona Qualley U Cailin Gebel 11/29/2023, 10:44 PM

## 2023-11-29 NOTE — Progress Notes (Signed)
 eLink Physician-Brief Progress Note Patient Name: Cory Russell DOB: [birth year] MRN: [id number]   Date of Service  11/29/2023  HPI/Events of Note  CXR shows large left pneumothorax. Existing chest tube is   set to -20 of suction and appears to be functional.  eICU Interventions  Suction on existing chest tube increased to -30  from -20, bedside RN asked to get stuff together for a possible second chest tube and ground crew requested to evaluate patient at bedside.        [name] 11/29/2023, 11:21 PM

## 2023-11-29 NOTE — Progress Notes (Signed)
 11/29/2023 CXR worse, O2 needs worse No new fevers or secretions Check ESR, if up probably need to pulse steroids. Continue diuresis  Rolan Sharps MD PCCM

## 2023-11-30 ENCOUNTER — Inpatient Hospital Stay (HOSPITAL_COMMUNITY)

## 2023-11-30 DIAGNOSIS — B59 Pneumocystosis: Secondary | ICD-10-CM | POA: Diagnosis not present

## 2023-11-30 DIAGNOSIS — A419 Sepsis, unspecified organism: Secondary | ICD-10-CM | POA: Diagnosis not present

## 2023-11-30 DIAGNOSIS — B259 Cytomegaloviral disease, unspecified: Secondary | ICD-10-CM | POA: Diagnosis not present

## 2023-11-30 DIAGNOSIS — Z7189 Other specified counseling: Secondary | ICD-10-CM

## 2023-11-30 DIAGNOSIS — J939 Pneumothorax, unspecified: Secondary | ICD-10-CM | POA: Diagnosis not present

## 2023-11-30 DIAGNOSIS — Z515 Encounter for palliative care: Secondary | ICD-10-CM

## 2023-11-30 DIAGNOSIS — J80 Acute respiratory distress syndrome: Secondary | ICD-10-CM | POA: Diagnosis not present

## 2023-11-30 DIAGNOSIS — E875 Hyperkalemia: Secondary | ICD-10-CM | POA: Diagnosis not present

## 2023-11-30 DIAGNOSIS — J9601 Acute respiratory failure with hypoxia: Secondary | ICD-10-CM | POA: Diagnosis not present

## 2023-11-30 DIAGNOSIS — B20 Human immunodeficiency virus [HIV] disease: Secondary | ICD-10-CM | POA: Diagnosis not present

## 2023-11-30 LAB — CBC
HCT: 24.8 % — ABNORMAL LOW (ref 39.0–52.0)
Hemoglobin: 7.8 g/dL — ABNORMAL LOW (ref 13.0–17.0)
MCH: 28.9 pg (ref 26.0–34.0)
MCHC: 31.5 g/dL (ref 30.0–36.0)
MCV: 91.9 fL (ref 80.0–100.0)
Platelets: 197 10*3/uL (ref 150–400)
RBC: 2.7 MIL/uL — ABNORMAL LOW (ref 4.22–5.81)
RDW: 17.3 % — ABNORMAL HIGH (ref 11.5–15.5)
WBC: 10.5 10*3/uL (ref 4.0–10.5)
nRBC: 0.2 % (ref 0.0–0.2)

## 2023-11-30 LAB — CULTURE, RESPIRATORY W GRAM STAIN: Gram Stain: NONE SEEN

## 2023-11-30 LAB — BASIC METABOLIC PANEL WITH GFR
Anion gap: 11 (ref 5–15)
BUN: 41 mg/dL — ABNORMAL HIGH (ref 6–20)
CO2: 43 mmol/L — ABNORMAL HIGH (ref 22–32)
Calcium: 8.3 mg/dL — ABNORMAL LOW (ref 8.9–10.3)
Chloride: 87 mmol/L — ABNORMAL LOW (ref 98–111)
Creatinine, Ser: 0.8 mg/dL (ref 0.61–1.24)
GFR, Estimated: >60 mL/min (ref >60)
Glucose, Bld: 160 mg/dL — ABNORMAL HIGH (ref 70–99)
Potassium: 5.2 mmol/L — ABNORMAL HIGH (ref 3.5–5.1)
Sodium: 141 mmol/L (ref 135–145)

## 2023-11-30 LAB — HIV-1 RNA QUANT-NO REFLEX-BLD
HIV 1 RNA Quant: 50 {copies}/mL
LOG10 HIV-1 RNA: 1.699 {Log_copies}/mL

## 2023-11-30 LAB — POCT I-STAT 7, (LYTES, BLD GAS, ICA,H+H)
Acid-Base Excess: 14 mmol/L — ABNORMAL HIGH (ref 0.0–2.0)
Bicarbonate: 42.9 mmol/L — ABNORMAL HIGH (ref 20.0–28.0)
Calcium, Ion: 1.18 mmol/L (ref 1.15–1.40)
HCT: 22 % — ABNORMAL LOW (ref 39.0–52.0)
Hemoglobin: 7.5 g/dL — ABNORMAL LOW (ref 13.0–17.0)
O2 Saturation: 99 %
Patient temperature: 97.8
Potassium: 5.6 mmol/L — ABNORMAL HIGH (ref 3.5–5.1)
Sodium: 131 mmol/L — ABNORMAL LOW (ref 135–145)
TCO2: 46 mmol/L — ABNORMAL HIGH (ref 22–32)
pCO2 arterial: 87.9 mmHg (ref 32–48)
pH, Arterial: 7.294 — ABNORMAL LOW (ref 7.35–7.45)
pO2, Arterial: 148 mmHg — ABNORMAL HIGH (ref 83–108)

## 2023-11-30 LAB — GLUCOSE, CAPILLARY
Glucose-Capillary: 183 mg/dL — ABNORMAL HIGH (ref 70–99)
Glucose-Capillary: 252 mg/dL — ABNORMAL HIGH (ref 70–99)
Glucose-Capillary: 112 mg/dL — ABNORMAL HIGH (ref 70–99)
Glucose-Capillary: 193 mg/dL — ABNORMAL HIGH (ref 70–99)
Glucose-Capillary: 189 mg/dL — ABNORMAL HIGH (ref 70–99)
Glucose-Capillary: 187 mg/dL — ABNORMAL HIGH (ref 70–99)
Glucose-Capillary: 163 mg/dL — ABNORMAL HIGH (ref 70–99)
Glucose-Capillary: 216 mg/dL — ABNORMAL HIGH (ref 70–99)
Glucose-Capillary: 190 mg/dL — ABNORMAL HIGH (ref 70–99)

## 2023-11-30 LAB — BLASTOMYCES ANTIGEN
Blastomyces Antigen: NOT DETECTED ng/mL
Interpretation: NEGATIVE

## 2023-11-30 LAB — TRIGLYCERIDES: Triglycerides: 227 mg/dL — ABNORMAL HIGH (ref <150)

## 2023-11-30 LAB — PHOSPHORUS: Phosphorus: 3.8 mg/dL (ref 2.5–4.6)

## 2023-11-30 LAB — MAGNESIUM: Magnesium: 2.8 mg/dL — ABNORMAL HIGH (ref 1.7–2.4)

## 2023-11-30 MED ORDER — INSULIN ASPART 100 UNIT/ML IJ SOLN
0.0000 [IU] | INTRAMUSCULAR | Status: DC
  Administered 2023-11-30: 7 [IU] via SUBCUTANEOUS
  Administered 2023-11-30 (×2): 4 [IU] via SUBCUTANEOUS
  Administered 2023-12-01: 3 [IU] via SUBCUTANEOUS
  Administered 2023-12-01: 4 [IU] via SUBCUTANEOUS
  Administered 2023-12-01: 7 [IU] via SUBCUTANEOUS
  Administered 2023-12-01 (×2): 4 [IU] via SUBCUTANEOUS
  Administered 2023-12-02: 7 [IU] via SUBCUTANEOUS
  Administered 2023-12-02 (×2): 4 [IU] via SUBCUTANEOUS
  Administered 2023-12-02: 3 [IU] via SUBCUTANEOUS
  Administered 2023-12-02 – 2023-12-03 (×3): 4 [IU] via SUBCUTANEOUS
  Administered 2023-12-03: 3 [IU] via SUBCUTANEOUS
  Administered 2023-12-03: 7 [IU] via SUBCUTANEOUS
  Administered 2023-12-03 – 2023-12-04 (×6): 4 [IU] via SUBCUTANEOUS
  Administered 2023-12-04 (×2): 7 [IU] via SUBCUTANEOUS
  Administered 2023-12-04: 4 [IU] via SUBCUTANEOUS
  Administered 2023-12-04: 7 [IU] via SUBCUTANEOUS
  Administered 2023-12-05: 4 [IU] via SUBCUTANEOUS
  Administered 2023-12-05: 7 [IU] via SUBCUTANEOUS
  Administered 2023-12-05 (×2): 3 [IU] via SUBCUTANEOUS
  Administered 2023-12-06: 4 [IU] via SUBCUTANEOUS
  Administered 2023-12-06: 11 [IU] via SUBCUTANEOUS
  Administered 2023-12-06 (×2): 7 [IU] via SUBCUTANEOUS
  Administered 2023-12-06: 3 [IU] via SUBCUTANEOUS
  Administered 2023-12-06 – 2023-12-07 (×2): 7 [IU] via SUBCUTANEOUS
  Administered 2023-12-07: 11 [IU] via SUBCUTANEOUS

## 2023-11-30 MED ORDER — ACETAMINOPHEN 325 MG PO TABS
650.0000 mg | ORAL_TABLET | Freq: Four times a day (QID) | ORAL | Status: DC | PRN
  Administered 2023-11-30 – 2023-12-13 (×19): 650 mg
  Filled 2023-11-30 (×19): qty 2

## 2023-11-30 MED ORDER — FENTANYL 2500MCG IN NS 250ML (10MCG/ML) PREMIX INFUSION
0.0000 ug/h | INTRAVENOUS | Status: DC
  Administered 2023-11-30: 350 ug/h via INTRAVENOUS
  Administered 2023-12-01: 300 ug/h via INTRAVENOUS
  Administered 2023-12-01: 275 ug/h via INTRAVENOUS
  Administered 2023-12-01 – 2023-12-02 (×2): 300 ug/h via INTRAVENOUS
  Administered 2023-12-02 – 2023-12-06 (×12): 350 ug/h via INTRAVENOUS
  Administered 2023-12-06: 150 ug/h via INTRAVENOUS
  Administered 2023-12-07: 200 ug/h via INTRAVENOUS
  Filled 2023-11-30 (×19): qty 250

## 2023-11-30 MED ORDER — VECURONIUM BROMIDE 10 MG IV SOLR
0.1000 mg/kg | Freq: Once | INTRAVENOUS | Status: AC
  Administered 2023-11-30: 5 mg via INTRAVENOUS
  Filled 2023-11-30: qty 10

## 2023-11-30 MED ORDER — ROCURONIUM BROMIDE 10 MG/ML (PF) SYRINGE
50.0000 mg | PREFILLED_SYRINGE | Freq: Once | INTRAVENOUS | Status: AC
  Administered 2023-11-30: 50 mg via INTRAVENOUS
  Filled 2023-11-30: qty 10

## 2023-11-30 MED ORDER — MIDAZOLAM HCL 2 MG/2ML IJ SOLN
2.0000 mg | Freq: Once | INTRAMUSCULAR | Status: AC
  Administered 2023-11-30: 2 mg via INTRAVENOUS

## 2023-11-30 MED ORDER — FUROSEMIDE 10 MG/ML IJ SOLN
60.0000 mg | Freq: Two times a day (BID) | INTRAMUSCULAR | Status: DC
  Administered 2023-11-30 – 2023-12-04 (×9): 60 mg via INTRAVENOUS
  Filled 2023-11-30 (×9): qty 6

## 2023-11-30 MED ORDER — MIDAZOLAM HCL 2 MG/2ML IJ SOLN
1.0000 mg | INTRAMUSCULAR | Status: DC | PRN
  Administered 2023-12-01 – 2023-12-02 (×5): 2 mg via INTRAVENOUS
  Filled 2023-11-30 (×6): qty 2

## 2023-11-30 MED ORDER — FENTANYL BOLUS VIA INFUSION
25.0000 ug | INTRAVENOUS | Status: DC | PRN
  Administered 2023-11-30 – 2023-12-01 (×3): 100 ug via INTRAVENOUS
  Administered 2023-12-02: 50 ug via INTRAVENOUS
  Administered 2023-12-03 (×2): 100 ug via INTRAVENOUS
  Administered 2023-12-03 – 2023-12-06 (×3): 50 ug via INTRAVENOUS
  Administered 2023-12-06 (×2): 100 ug via INTRAVENOUS
  Administered 2023-12-06: 50 ug via INTRAVENOUS
  Administered 2023-12-07 (×10): 100 ug via INTRAVENOUS

## 2023-11-30 MED ORDER — ROCURONIUM BROMIDE 10 MG/ML (PF) SYRINGE
50.0000 mg | PREFILLED_SYRINGE | Freq: Once | INTRAVENOUS | Status: AC
  Administered 2023-11-30: 50 mg via INTRAVENOUS

## 2023-11-30 MED ORDER — SODIUM ZIRCONIUM CYCLOSILICATE 10 G PO PACK
10.0000 g | PACK | Freq: Once | ORAL | Status: DC
  Filled 2023-11-30: qty 1

## 2023-11-30 MED ORDER — PROPOFOL 1000 MG/100ML IV EMUL
0.0000 ug/kg/min | INTRAVENOUS | Status: DC
  Administered 2023-11-30 (×2): 80 ug/kg/min via INTRAVENOUS
  Administered 2023-12-01: 70 ug/kg/min via INTRAVENOUS
  Administered 2023-12-01: 80 ug/kg/min via INTRAVENOUS
  Administered 2023-12-01: 40 ug/kg/min via INTRAVENOUS
  Administered 2023-12-01: 50 ug/kg/min via INTRAVENOUS
  Administered 2023-12-02 (×2): 70 ug/kg/min via INTRAVENOUS
  Administered 2023-12-02: 80 ug/kg/min via INTRAVENOUS
  Filled 2023-11-30 (×9): qty 100

## 2023-11-30 NOTE — Progress Notes (Addendum)
 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)   Pneumonia due to Pneumocystis jirovecii (HCC)   Hypoxia   Healthcare associated bacterial pneumonia   Pneumothorax   IRIS (immune reconstitution inflammatory syndrome) (HCC)   Cytomegalovirus (CMV) viremia (HCC)   Current antibiotics: IV Bactrim  6/4-6/15, clindamycin  and primaquine  6/16-6/17, IV bactrim  6/18-6/23, Clindamycin  and primquine 6/23-6/24 Cefepime 6/22- Vancomycin 6/22 , Linezolid 6/23- Dapsone for PJP ppx 6/25-  Lines/Hardwares: Left side CT*2   Interval Events: T max 100.3, s/p 2nd chest tube placement, Blastomyces negative   Assessment 33 year old male with prior history of HIV/AIDS, noncompliance with medications, epilepsy admitted with subacute cough and dyspnea, also seizure.  Currently being treated for possible severe PJP PNA with concerns for IRIS, in the setting of rapid decline of viral load with initiation of ART.   Patient had severe worsening of respite status on 6/22 requiring ICU transfer and intubation.   # Acute Hypoxemic Respiratory Failure due to Possible PJP PNA with possible IRIS complicated with possible HAP  - completed 21 days course of PJP pna treatment on 6/24 and started on dapsone ppx - complicated with pneumothorax with foot CT on 6/23 and second chest tube on 6/26  # CMV  viremia - unlikely to have an end organ disease, expected finding in poorly controlled HIV and expect to resolve with ART. No visual, neurologic or GI symptoms prior to intubation. Uncommon to have CMV pneumonitis and will need biopsy for definitive diagnosis but considered high risk, his CMV VL from BAL on 6/23 39K, CMV ( 6/24) went up to 67K,  on preemptive ganciclovir  # AIDs - h/o poor compliance, VL quickly went down from 306K to 640 this admission in approx 10 days after initiation of ART with concerns for IRIS as above. CD4 still less than 35( 8%)  # Hyperkalemia - Primary managing   Recommendations - PCCM managing IV steroids for concerns for IRIS ( on methyprednisone 80 mg IV BID) - complete 7 days course of linezolid and cefepime pending BAL cultures including viral cultures  - His CMV viral load from blood although elevated to 67K, it was approx 3-4 days into IV ganciclovir treatment and premature to evaluate response and will plan to repeat next week - Fu HIV RNA - Daily CBC and BMP - Standard isolation precautions  Thank you for the consult. Please page with pertinent questions or concerns.  ______________________________________________________________________ Subjective patient seen and examined at the bedside. Intubated and sedated, Mother at bedside.  Spoke with RN at bedside. Stool is mushy and soft per RN, no diarrhea.   Vitals BP 120/69   Pulse (!) 110   Temp 98.9 F (37.2 C) (Axillary)   Resp (!) 30   Ht 5' 7 (1.702 m)   Wt 51.9 kg   SpO2 (!) 81%   BMI 17.92 kg/m     Physical Exam Constitutional:  adult male orotracheally intubated and sedated    Comments:  Cardiovascular:     Rate and Rhythm: Normal rate and regular rhythm.     Heart sounds:   Pulmonary:     Effort: Pulmonary effort normal; on vent, left CT*2    Comments: coarse mechanical breath sounds b/l, Fio2 90%   Abdominal:     Palpations: Abdomen is non distended    Tenderness:    Musculoskeletal:        General: No swelling or tenderness in peripheral joints   Skin:    Comments: no rashes   Neurological:     General: sedated, pupils bilaterally symmetrical  Pertinent Microbiology Results for orders placed or performed during the hospital encounter of 11/08/23  Culture, blood (Routine X 2) w Reflex to ID Panel     Status: None   Collection Time: 11/08/23  4:18 AM   Specimen: BLOOD  Result Value Ref Range Status   Specimen Description BLOOD BLOOD RIGHT ARM  Final   Special Requests   Final    BOTTLES DRAWN AEROBIC AND ANAEROBIC Blood Culture adequate volume   Culture   Final    NO GROWTH 5 DAYS Performed at [facility], [address], [place], [place] [address]    Report Status 11/13/2023 FINAL  Final  Culture, blood (Routine X 2) w Reflex to ID Panel     Status: None   Collection Time: 11/08/23  4:22 AM   Specimen: BLOOD  Result Value Ref Range Status   Specimen Description BLOOD BLOOD RIGHT ARM  Final   Special Requests   Final    BOTTLES DRAWN AEROBIC AND ANAEROBIC Blood Culture adequate volume   Culture   Final    NO GROWTH 5 DAYS Performed at [facility], [address] [place], [place] [address]    Report Status 11/13/2023 FINAL  Final  Respiratory (~20 pathogens) panel by PCR     Status: None   Collection Time: 11/08/23  8:42 AM   Specimen: Nasopharyngeal Swab; Respiratory  Result Value Ref Range Status   Adenovirus NOT DETECTED NOT DETECTED Final   Coronavirus 229E NOT DETECTED NOT DETECTED Final    Comment: (NOTE) The Coronavirus on the Respiratory Panel, DOES NOT test for the novel  Coronavirus (2019 nCoV)    Coronavirus HKU1 NOT DETECTED NOT DETECTED Final   Coronavirus NL63 NOT DETECTED NOT DETECTED Final   Coronavirus OC43 NOT DETECTED NOT DETECTED Final   Metapneumovirus NOT DETECTED NOT DETECTED Final   Rhinovirus / Enterovirus NOT DETECTED NOT DETECTED Final   Influenza A NOT DETECTED NOT DETECTED Final    Influenza B NOT DETECTED NOT DETECTED Final   Parainfluenza Virus 1 NOT DETECTED NOT DETECTED Final   Parainfluenza Virus 2 NOT DETECTED NOT DETECTED Final   Parainfluenza Virus 3 NOT DETECTED NOT DETECTED Final   Parainfluenza Virus 4 NOT DETECTED NOT DETECTED Final   Respiratory Syncytial Virus NOT DETECTED NOT DETECTED Final   Bordetella pertussis NOT DETECTED NOT DETECTED Final   Bordetella Parapertussis NOT DETECTED NOT DETECTED Final   Chlamydophila pneumoniae NOT DETECTED NOT DETECTED Final   Mycoplasma pneumoniae NOT DETECTED NOT DETECTED Final    Comment: Performed at [facility], [address], [place], [place] [address]  Expectorated Sputum Assessment w Gram Stain, Rflx to Resp Cult     Status: None   Collection Time: 11/17/23  2:20 PM   Specimen: Expectorated Sputum  Result Value Ref Range Status   Specimen Description EXPECTORATED SPUTUM  Final   Special  Requests Immunocompromised  Final   Sputum evaluation   Final    THIS SPECIMEN IS ACCEPTABLE FOR SPUTUM CULTURE Performed at [facility], [address], [place], [place] [address]    Report Status 11/17/2023 FINAL  Final  Culture, Respiratory w Gram Stain     Status: None   Collection Time: 11/17/23  2:20 PM  Result Value Ref Range Status   Specimen Description EXPECTORATED SPUTUM  Final   Special Requests Immunocompromised Reflexed from [id number]  Final   Gram Stain NO WBC SEEN FEW GRAM POSITIVE COCCI IN PAIRS   Final   Culture   Final    MODERATE Normal respiratory flora-no Staph aureus or Pseudomonas seen Performed at [facility], [address] [place], [place] [address]    Report Status 11/19/2023 FINAL  Final  Blastomyces Antigen     Status: None   Collection Time: 11/25/23  6:05 AM   Specimen: Blood  Result Value Ref Range Status   Blastomyces Antigen None Detected None Detected ng/mL Final    Comment: (NOTE) Reference Interval: None Detected Reportable Range: 0.31 ng/mL - 20.00  ng/mL Results above 20.00 ng/mL are reported as 'Positive, Above the Limit of Quantification' This test was developed and its performance characteristics determined by [employer]. It has not been cleared or approved by the FDA; however, FDA clearance or approval is not currently required for clinical use. The results are not intended to be used as the sole means for clinical diagnosis or patient decisions.    Interpretation Negative  Final   Specimen Type SERUM  Final    Comment: (NOTE) Performed At: [facility] [address] [place], [place] [id number] [place] Pac MD Ey:[id number]   MRSA Next Gen by PCR, Nasal     Status: Abnormal   Collection Time: 11/25/23 11:35 PM   Specimen: Nasal Mucosa; Nasal Swab  Result Value Ref Range Status   MRSA by PCR Next Gen DETECTED (A) NOT DETECTED Final    Comment: RESULT CALLED TO, READ BACK BY AND VERIFIED WITH: SHAFFNER,P RN 11/26/2023 AT 0218 SKEEN,P (NOTE) The GeneXpert MRSA Assay (FDA approved for NASAL specimens only), is one component of a comprehensive MRSA colonization surveillance program. It is not intended to diagnose MRSA infection nor to guide or monitor treatment for MRSA infections. Test performance is not FDA approved in patients less than 50 years old. Performed at [facility], [address], [place], [place] [address]   Culture, Respiratory w Gram Stain     Status: None (Preliminary result)   Collection Time: 11/27/23  9:27 AM   Specimen: Bronchoalveolar Lavage; Respiratory  Result Value Ref Range Status   Specimen Description BRONCHIAL ALVEOLAR LAVAGE  Final   Special Requests NONE  Final   Gram Stain NO WBC SEEN NO ORGANISMS SEEN   Final   Culture   Final    CULTURE REINCUBATED FOR BETTER GROWTH Performed at [facility], [address], [place], [place] [address]    Report Status PENDING  Incomplete  Acid Fast Smear (AFB)     Status: None   Collection Time: 11/27/23   9:27 AM   Specimen: Bronchoalveolar Lavage; Respiratory  Result Value Ref Range Status   AFB Specimen Processing Concentration  Final   Acid Fast Smear Negative  Final    Comment: (NOTE) Performed At: [facility] [address] [place], [place] [id number] Jennette Shorter MD Ey:[id number]    Source (AFB) BRONCHIAL ALVEOLAR  LAVAGE  Final    Comment: Performed at [facility], [address] [place], [place] [address]  Virus culture     Status: None   Collection Time: 11/27/23  9:27 AM   Specimen: Bronchoalveolar Lavage  Result Value Ref Range Status   Viral Culture Comment  Final    Comment: (NOTE) Preliminary Report: No virus isolated at 24 hours. Next report to follow after 4 days. Performed At: [facility] [address] [place], [place] [id number] Jennette Shorter MD Ey:[id number]    Source of Sample BRONCHIAL ALVEOLAR LAVAGE  Final    Comment: Performed at [facility], [address], [place], [place] [address]   Pertinent Lab.    Latest Ref Rng & Units 11/30/2023    2:53 AM 11/29/2023    3:36 AM 11/28/2023    7:50 AM  CBC  WBC 4.0 - 10.5 K/uL 10.5  8.5    Hemoglobin 13.0 - 17.0 g/dL 7.8  7.4  7.5   Hematocrit 39.0 - 52.0 % 24.8  21.8  22.0   Platelets 150 - 400 K/uL 197  207        Latest Ref Rng & Units 11/30/2023    2:53 AM 11/29/2023    5:22 PM 11/29/2023    1:44 PM  CMP  Glucose 70 - 99 mg/dL 839     BUN 6 - 20 mg/dL 41     Creatinine 9.38 - 1.24 mg/dL 9.19     Sodium 864 - 854 mmol/L 141     Potassium 3.5 - 5.1 mmol/L 5.2  4.2  4.2   Chloride 98 - 111 mmol/L 87     CO2 22 - 32 mmol/L 43     Calcium  8.9 - 10.3 mg/dL 8.3      Pertinent Imaging today Plain films and CT images have been personally visualized and interpreted; radiology reports have been reviewed. Decision making incorporated into the Impression /   DG CHEST PORT 1 VIEW Result Date: 11/30/2023 CLINICAL DATA:  Chest tube in place. EXAM: PORTABLE CHEST 1 VIEW  COMPARISON:  Same day. FINDINGS: Two left-sided pigtail catheters are noted in the left hemithorax. Large left pneumothorax is noted. Diffuse right lung opacity is noted. IMPRESSION: Large left pneumothorax is noted which is increased compared to prior exam. 2 pigtail catheters are noted in left hemithorax. Subcutaneous emphysema is seen overlying left lateral chest wall. Electronically Signed   By: Lynwood Landy Raddle M.D.   On: 11/30/2023 11:33   DG CHEST PORT 1 VIEW Result Date: 11/30/2023 CLINICAL DATA:  Chest tube placement. EXAM: PORTABLE CHEST 1 VIEW COMPARISON:  Same day. FINDINGS: Interval placement of large bore left-sided chest tube with nearly complete resolution left pneumothorax. Subcutaneous emphysema is noted over left lateral chest wall. IMPRESSION: Interval placement of left-sided large-bore chest tube with nearly complete resolution of left pneumothorax. Electronically Signed   By: Lynwood Landy Raddle M.D.   On: 11/30/2023 11:26   DG CHEST PORT 1 VIEW Result Date: 11/30/2023 CLINICAL DATA:  Left chest tube placement/ reposition EXAM: PORTABLE CHEST 1 VIEW COMPARISON:  Chest x-ray 11/29/2023 FINDINGS: Endotracheal tube with tip terminating 4-5 cm above the carina. Enteric tube courses below the hemidiaphragm with tip overlying the expected region of the gastric antrum/pylorus. Gastric lumen partially visualized with PO contrast. Two left chest tubes noted with pigtails overlying the left hemidiaphragms. The heart and mediastinal contours are within normal limits. Improved aeration of the bilateral lung with persistent diffuse  patchy airspace in the cysto opacities. No pulmonary edema. No pleural effusion. Interval decrease in size of a moderate volume left pneumothorax. No right pneumothorax. No acute osseous abnormality. IMPRESSION: 1. Interval decrease in size of a moderate volume left pneumothorax. 2. Improved aeration of the bilateral lung with persistent diffuse patchy airspace in the cysto  opacities. 3. Lines and tubes as above. Electronically Signed   By: Morgane  Naveau M.D.   On: 11/30/2023 01:20   DG Chest Port 1 View Result Date: 11/29/2023 CLINICAL DATA:  Respiratory distress EXAM: PORTABLE CHEST 1 VIEW COMPARISON:  11/29/2023 FINDINGS: Left chest tube remains in place. Worsening left pneumothorax, now large in size. Endotracheal tube is unchanged. Diffuse airspace disease throughout the right lung. Heart and mediastinal contours are within normal limits. IMPRESSION: Left chest tube remains in stable position. Enlarging left pneumothorax, now large in size. Diffuse airspace disease throughout the right lung. These results will be called to the ordering clinician or representative by the Radiologist Assistant, and communication documented in the PACS or [employer]. Electronically Signed   By: Franky Crease M.D.   On: 11/29/2023 22:54   DG Chest Port 1 View Result Date: 11/29/2023 CLINICAL DATA:  [id number] Pneumothorax [id number] EXAM: PORTABLE CHEST 1 VIEW COMPARISON:  From earlier the same day, 9:56 a.m. FINDINGS: Redemonstration of small left apicolateral pneumothorax. Redemonstration of diffuse heterogeneous alveolar and interstitial opacities throughout bilateral lungs with relative sparing of bilateral lung apices. Findings are nonspecific and differential diagnosis includes ARDS, multilobar pneumonia, pulmonary edema, etc. No pleural effusion on either side. No right pneumothorax. Stable cardio-mediastinal silhouette. No acute osseous abnormalities. Redemonstration of surgical emphysema along the left lateral chest wall. The soft tissues are otherwise within normal limits. Enteric tube, endotracheal tube and left-sided pleural drainage catheter are essentially unchanged in position. IMPRESSION: *Essentially stable exam. Electronically Signed   By: Ree Molt M.D.   On: 11/29/2023 15:12    I spent 50 minutes involved in face-to-face and non-face-to-face activities for this patient  on the day of the visit. Professional time spent includes the following activities: Preparing to see the patient (review of tests), Obtaining and reviewing separately obtained history (admission/discharge record), Performing a medically appropriate examination and evaluation , Ordering medications/labs, referring and communicating with other health care professionals, Documenting clinical information in the EMR, Independently interpreting results (not separately reported), Communicating results to the patient/family/caregiver, Counseling and educating the patient/family/caregiver and Care coordination (not separately reported).   Plan d/w requesting provider as well as ID pharm D  Of note, portions of this note may have been created with voice recognition software. While this note has been edited for accuracy, occasional wrong-word or 'sound-a-like' substitutions may have occurred due to the inherent limitations of voice recognition software.   Electronically signed by:   Annalee Orem, MD Infectious Disease Physician [facility] Pager: [phone]

## 2023-11-30 NOTE — Plan of Care (Signed)
  Problem: Nutrition: Goal: Adequate nutrition will be maintained Outcome: Progressing   Problem: Elimination: Goal: Will not experience complications related to bowel motility Outcome: Progressing Goal: Will not experience complications related to urinary retention Outcome: Progressing   Problem: Pain Managment: Goal: General experience of comfort will improve and/or be controlled Outcome: Progressing   Problem: Skin Integrity: Goal: Risk for impaired skin integrity will decrease Outcome: Progressing   Problem: Skin Integrity: Goal: Risk for impaired skin integrity will decrease Outcome: Progressing   Problem: Clinical Measurements: Goal: Respiratory complications will improve Outcome: Not Progressing

## 2023-11-30 NOTE — Procedures (Signed)
 Insertion of Chest Tube Procedure Note  Cree Kunert  [id number]  1990/12/26  Date:11/30/23  Time:12:53 AM    Provider Performing: Norleen JONETTA Cedar   Procedure: Chest Tube Insertion [id number])  Indication(s) Pneumothorax  Consent Risks of the procedure as well as the alternatives and risks of each were explained to the patient and/or caregiver.  Consent for the procedure was obtained and is signed in the bedside chart  Anesthesia Topical only with 1% lidocaine     Time Out Verified patient identification, verified procedure, site/side was marked, verified correct patient position, special equipment/implants available, medications/allergies/relevant history reviewed, required imaging and test results available.   Sterile Technique Maximal sterile technique including full sterile barrier drape, hand hygiene, sterile gown, sterile gloves, mask, hair covering, sterile ultrasound probe cover (if used).   Procedure Description Ultrasound used to identify appropriate pleural anatomy for placement and overlying skin marked. Area of placement cleaned and draped in sterile fashion.  A 14 French pigtail pleural catheter was placed into the left pleural space using Seldinger technique. Appropriate return of air was obtained.  The tube was connected to atrium and placed on -20 cm H2O wall suction.   Complications/Tolerance None; patient tolerated the procedure well. Chest X-ray is ordered to verify placement.   EBL Minimal  Specimen(s) none  JD Cedar RIGGERS [facility] 11/30/2023, 12:57 AM  Please see [email or web address] for pager details.  From 7A-7P if no response, please call [phone]. After hours, please call ELink [phone].

## 2023-11-30 NOTE — Procedures (Signed)
 Insertion of Chest Tube Procedure Note  Burnice Oestreicher  [id number]  22-Mar-1991  Date:11/30/23  Time:10:15 AM    Provider Performing: Leita SAUNDERS Xia Stohr   Procedure: Chest Tube Insertion ([id number])  Indication(s) Pneumothorax  Consent Risks of the procedure as well as the alternatives and risks of each were explained to the patient and/or caregiver.  Consent for the procedure was obtained and is signed in the bedside chart  Anesthesia Topical only with 1% lidocaine     Time Out Verified patient identification, verified procedure, site/side was marked, verified correct patient position, special equipment/implants available, medications/allergies/relevant history reviewed, required imaging and test results available.   Sterile Technique Maximal sterile technique including full sterile barrier drape, hand hygiene, sterile gown, sterile gloves, mask, hair covering, sterile ultrasound probe cover (if used).   Procedure Description Ultrasound used to identify appropriate pleural anatomy for placement and overlying skin marked. Area of placement cleaned and draped in sterile fashion.  A 32 French chest tube was placed into the left pleural space using Kelly dissection. Appropriate return of air was obtained.  The tube was connected to atrium and placed on -20 cm H2O wall suction.   Complications/Tolerance None; patient tolerated the procedure well. Chest X-ray is ordered to verify placement.   EBL Minimal  Specimen(s) none   Leita SAUNDERS Dejanay Wamboldt, PA-C

## 2023-11-30 NOTE — Progress Notes (Signed)
 11/30/2023   I have seen and evaluated the patient for ARDS and agree with APP's exam and plan below with following additions:  PTX still incompletely evacuated, will put in bigger tube Really hard finding vent settings for him that don't cause de-recruitment, tried SIMV yesterday with variable success; may try APRV after chest tube adjustments Overall think he will need a trach to liberate from vent but mother does not think he would want that; she is amenable to talking to palliative care regarding options    Critical care time as documented below.  I performed the majority of medical decision making for this critically ill patient.   Rolan Sharps MD [facility] Prefer epic messenger for cross cover needs

## 2023-11-30 NOTE — Consult Note (Cosign Needed Addendum)
 Palliative Care Consult Note                                  Date: 11/30/2023   Patient Name: Cory Russell  DOB: [birth year]  MRN: [id number]  Age / Sex: 33 y.o., male  PCP: Luiz Channel, MD Referring Physician: Claudene Toribio BROCKS, MD  Reason for Consultation: Establishing goals of care  HPI/Patient Profile: 33 y.o. male  with past medical history of HIV/AIDS, CD4<35 on admission, not on ARVs who presented to the ED with seizure activity on 11/08/2023 and is admitted with acute respiratory failure with ARDS and severe pneumocystis pneumonia.   Palliative Medicine has been consulted for goals of care discussions. Patient and family are faced with anticipatory care needs and complex medical decision making.   Clinical Assessment and Goals of Care:   Extensive chart review has been completed prior including labs, vital signs, imaging, progress/consult notes, orders, medications and available advance directive documents.    Updates received from RN. Patient assessed at bedside. He is intubated and continues to require high amounts of sedation.   I met with patient's mother/Jenny in the 2H conference room to discuss diagnosis, prognosis, and GOC. I introduced Palliative Medicine as specialized medical care for people living with serious illness.   Created space and opportunity for mother to express thoughts and feelings regarding current medical situation. Values and goals of care were attempted to be elicited.  Life Review: Mom describes Gurdeep as outgoing, a good person, and never met a stranger. He is a Buyer, retail of [employer], and had a career as a Optician, dispensing. Prior to admission, Ekansh was living with mom.   Discussion: We discussed patient's current illness and what it means in the larger context of his ongoing co-morbidities. Current clinical status was reviewed.   Mother verbalizes understanding that patient is critically-ill with  severe respiratory failure from pneumonia. Discussed concern that with current high ventilator settings, patient will likely need a trach if the goal is for continued full scope treatment.   I discussed with mom path for tracheostomy versus one way extubation. We discussed benefits of tracheostomy but also no guarantee of recovery to an acceptable quality of life. We did discuss that trach would allow more time for patient to recover as much as possible. We discussed alternative of one way extubation with the goal of comfort, understanding that he would likely die in a short period of time. I further explained that even with tracheostomy if he is not improving as desired, care can always be shifted to focus more on comfort at any time.  Mom does not feel that Daymion would want to be on long-term ventilator support, if there was not a reasonable chance of recovery. However, she is not accepting of a comfort path at this time and wants to allow more time for outcomes.  Discussed code status. I encouraged consideration of DNR/DNI status and provided education on evidence-based poor outcomes in similar hospitalized patients, as the cause of cardiac arrest would likely be associated with advanced illness rather than a reversible condition. Mom reports that earlier in the hospitalization, patient stated be wanted to remain full code and she intends to honor that for now.   Discussed the importance of continued conversation with the medical team regarding overall plan of care and treatment options, ensuring decisions are within the context of the patients values and GOCs.  Questions and concerns  addressed. Hard choices book provided.    Review of Systems  Unable to perform ROS   Objective:   Primary Diagnoses: Present on Admission:  Severe sepsis due to PJP pneumonia  History of syphilis  Depression   Physical Exam Vitals reviewed.  Constitutional:      General: He is not in acute distress.     Appearance: He is ill-appearing.     Interventions: He is sedated.   Cardiovascular:     Rate and Rhythm: Tachycardia present.  Pulmonary:     Comments: intubated    Vital Signs:  BP 108/72   Pulse (!) 107   Temp 98.9 F (37.2 C) (Axillary)   Resp (!) 21   Ht 5' 7 (1.702 m)   Wt 51.9 kg   SpO2 94%   BMI 17.92 kg/m   Palliative Assessment/Data: PPS 30%     Assessment & Plan:   SUMMARY OF RECOMMENDATIONS   Continue full scope care Discussed tracheostomy versus one-way extubation/comfort, mother does not think patient would not want long-term ventilator support  PMT will continue to follow  Primary Decision Maker: NEXT OF KIN - mother  Existing Vynca/ACP Documentation: None  Code Status/Advance Care Planning: Full code  Symptom Management:  Per attending  Prognosis:  Unable to determine  Discharge Planning:  To Be Determined     Thank you for allowing us  to participate in the care of Altan Kraai   Time Total: 78 minutes  Detailed review of medical records (labs, imaging, vital signs), medically appropriate exam, discussed with treatment team, counseling and education to patient, family, & staff, documenting clinical information, coordination of care.   Signed by: Recardo Loll, NP Palliative Medicine Team  Team Phone # [phone]  For individual providers, please see AMION

## 2023-11-30 NOTE — Progress Notes (Signed)
 NAME:  Cory Russell, MRN:  [id number], DOB:  Sep 20, 1990, LOS: 22 ADMISSION DATE:  11/08/2023, CONSULTATION DATE: 11/19/2023 REFERRING MD:  Jonel - TRH, CHIEF COMPLAINT:  Respiratory failure   History of Present Illness:  Cory Russell is a 33 y.o. man with past medical history of HIV/AIDS, CD4<35 on admission, not on ARVs. Presents with seizure like activity 11 days ago and found to have sepsis from pneumonia.  He notes symptoms started about 2-3 weeks ago with shortness of breath and dry cough. He had two separate rounds of antibiotics visiting other urgent care and ED visits. He has tried albuterol  nebulizer treatments from friends and initially found them helpful. CT Chest shows bilateral ground glass opacities concerning for pneumoncystis pneumonia. Since being here he has been Started on empiric antibiotics with bactrim  and steroids. Also resumed ARVs during this admission. Has been persistently hypoxemic on 10LNC. Pulmonary consulted to assist with evaluation and management and for bronchoscopy.   He feels persistently short of breath, worse with exertion. He feels like chest PT is helping him. Feeling more anxious. Also having difficulty taking pills.   Pertinent Medical History:  HIV, Seizure disorder  Significant Hospital Events: Including procedures, antibiotic start and stop dates in addition to other pertinent events   6/04 Admit 6/15 Pulmonary consult 6/18 Transient desats with worsening chest x-ray.  Prednisone  changed to IV Solu-Medrol  6/22 Worsening respiratory status requiring ICU transfer and intubation 6/23 Bronch for BAL per ID request. NO being weaned from 20 to 15. Overnight L PTX s/p chest tube. 6/25 Required transition to SIMV vent mode due to dyssynchrony. Sedation increased. Paralytic push given. CXR improved s/p diuresis. Required secondary L pigtail for persistent PTX.   Interim History / Subjective:  Overnight, required secondary L pigtail Unfortunately,  despite second pigtail, persistent L PTX Inferior pigtail catheter upsized to large bore chest tube with improvement in lung expansion Ongoing vent dyssynchrony requiring increased sedation and additional rocuronium  push ordered Lasix  60mg  BID today  Objective:   Blood pressure 111/68, pulse (!) 104, temperature 99.9 F (37.7 C), temperature source Axillary, resp. rate 19, height 5' 7 (1.702 m), weight 51.9 kg, SpO2 95%.    Vent Mode: SIMV;PRVC;PSV FiO2 (%):  [70 %-100 %] 90 % Set Rate:  [18 bmp-32 bmp] 18 bmp Vt Set:  [410 mL] 410 mL PEEP:  [8 cmH20] 8 cmH20 Pressure Support:  [10 cmH20] 10 cmH20 Plateau Pressure:  [15 cmH20-39 cmH20] 15 cmH20   Intake/Output Summary (Last 24 hours) at 11/30/2023 0737 Last data filed at 11/30/2023 0615 Gross per 24 hour  Intake 3022.45 ml  Output 3460 ml  Net -437.55 ml   Filed Weights   11/28/23 0415 11/29/23 0418 11/30/23 0500  Weight: 50.7 kg 50 kg 51.9 kg   Physical Examination: General: Acutely ill-appearing young man in NAD. Intubated, sedated. HEENT: [place]/AT, anicteric sclera, PERRL 2mm sluggish, moist mucous membranes. Copious clear oral secretions. Neuro: Intubated, sedated. Does not respond to verbal, tactile or noxious stimuli. Not following commands. No spontaneous movement of extremities on exam. +Corneal, +Cough, and +Gag  CV: RRR, no m/g/r. PULM: Breathing mildly tachypneic, mildly labored/intermittently dyssynchronous on vent (SIMV, PEEP 8, FiO2 90%. Lung fields coarse in upper fields, large bore chest tube on L, mild surrounding subQ emphysema from prior pigtail catheter. GI: Soft, nontender, nondistended. Normoactive bowel sounds. Extremities: Trace bilateral symmetric LE edema noted. Skin: Warm/dry, no rashes.  Assessment and Plan:   Acute hypoxemic & hypercapnic respiratory failure with ARDS Concern for superimposed  HAP vs aspiration to explain his worsening several weeks into his hospitalization - CTA 6/10 neg, LE duplex  6/23 neg. Severe Pneumocystis pneumonia Suspected IRIS New L PTX overnight 6/23 - s/p pigtail chest tube x 2, then large bore chest tube 6/26 Bronch/BAL 6/23: Cx NG, AFB smear negative; viral Cx negative, CMV quant 39,400 (11,800). Echo Limited 6/25 with hyperdynamic LV/RV EF 70-75%, no shunt noted. NO discontinued 6/23, Nimbex discontinued 6/24. Requiring intermittent paralytic pushes.  - Continue full vent support (4-8cc/kg IBW); goal Pplat < 30, driving pressures < 15 - Wean FiO2 for O2 sat > 90% - Daily WUA/SBT, not a candidate 6/26 due to persistent PTX and vent dyssynchrony - VAP bundle - Bronchodilators, steroids as ordered - Pulmonary hygiene - PAD protocol for sedation: Precedex, Propofol , and Fentanyl  for goal RASS -1 to -2 - Paralytic pushes PRN - F/u AFB Cx/fungal Cx, pneumocystis smear - Broad-spectrum antibiotics (cefepime/linezolid), PJP treatment (clinda + Primaquine ) - Continue ganciclovir for CMV treatment - ID following, appreciate assistance - Follow CXR - CT management per protocol  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 - ID following, appreciate assistance - Continue Biktarvy , Prezcobix  - Ganciclovir as above - F/u HIV-1 RNA  Hyperkalemia due to Bactrim  - s/p temporizing measures and Bactrim  d/c 6/23 Hyponatremia  AKI - Trend BMP - Replete electrolytes as indicated - Diuresis as tolerated, Lasix  60mg  BID today - Hold Lokelma  for now in the setting of diuresis - Monitor I&Os - F/u urine studies - Avoid nephrotoxic agents as able - Ensure adequate renal perfusion  Chronic anemia, likely due to HIV, acute illness - Trend H&H - Monitor for signs of active bleeding - Transfuse for Hgb < 7.0 or hemodynamically significant bleeding  Seizure Disorder - Continue Depakote  - Versed  PRN  Hyperglycemia; pre-DM. A1c 6.4 - SSI - CBGs Q4H - Goal CBG 140-180  Severe protein energy malnutrition Cachexia - Appreciate Nutrition/RD  assistance  Best Practice (right click and Reselect all SmartList Selections daily)   Diet/type: TF DVT prophylaxis prophylactic heparin  Pressure ulcer(s): N/A GI prophylaxis: H2B Lines: arterial line Foley:  Yes, and it is still needed Code Status:  full code Last date of multidisciplinary goals of care discussion [Patient affirmed he wants full code and all life support options 6/22]  Critical care time:   The patient is critically ill with multiple organ system failure and requires high complexity decision making for assessment and support, frequent evaluation and titration of therapies, advanced monitoring, review of radiographic studies and interpretation of complex data.   Critical Care Time devoted to patient care services, exclusive of separately billable procedures, described in this note is 36 minutes.  Corean CHRISTELLA Deissy Guilbert, PA-C [facility] 11/30/23 7:37 AM  Please see [email or web address] for pager details.  From 7A-7P if no response, please call [phone] After hours, please call ELink [phone]

## 2023-12-01 ENCOUNTER — Inpatient Hospital Stay (HOSPITAL_COMMUNITY)

## 2023-12-01 DIAGNOSIS — J80 Acute respiratory distress syndrome: Secondary | ICD-10-CM | POA: Diagnosis not present

## 2023-12-01 DIAGNOSIS — B59 Pneumocystosis: Secondary | ICD-10-CM | POA: Diagnosis not present

## 2023-12-01 DIAGNOSIS — B20 Human immunodeficiency virus [HIV] disease: Secondary | ICD-10-CM | POA: Diagnosis not present

## 2023-12-01 DIAGNOSIS — E871 Hypo-osmolality and hyponatremia: Secondary | ICD-10-CM | POA: Diagnosis not present

## 2023-12-01 DIAGNOSIS — J9601 Acute respiratory failure with hypoxia: Secondary | ICD-10-CM | POA: Diagnosis not present

## 2023-12-01 DIAGNOSIS — R509 Fever, unspecified: Secondary | ICD-10-CM

## 2023-12-01 DIAGNOSIS — B259 Cytomegaloviral disease, unspecified: Secondary | ICD-10-CM | POA: Diagnosis not present

## 2023-12-01 DIAGNOSIS — R739 Hyperglycemia, unspecified: Secondary | ICD-10-CM

## 2023-12-01 DIAGNOSIS — E875 Hyperkalemia: Secondary | ICD-10-CM | POA: Diagnosis not present

## 2023-12-01 LAB — BASIC METABOLIC PANEL WITH GFR
BUN: 36 mg/dL — ABNORMAL HIGH (ref 6–20)
CO2: >45 mmol/L — ABNORMAL HIGH (ref 22–32)
Calcium: 8.6 mg/dL — ABNORMAL LOW (ref 8.9–10.3)
Chloride: 85 mmol/L — ABNORMAL LOW (ref 98–111)
Creatinine, Ser: 0.72 mg/dL (ref 0.61–1.24)
GFR, Estimated: >60 mL/min (ref >60)
Glucose, Bld: 144 mg/dL — ABNORMAL HIGH (ref 70–99)
Potassium: 5.5 mmol/L — ABNORMAL HIGH (ref 3.5–5.1)
Sodium: 143 mmol/L (ref 135–145)

## 2023-12-01 LAB — CBC
HCT: 25.1 % — ABNORMAL LOW (ref 39.0–52.0)
Hemoglobin: 7.5 g/dL — ABNORMAL LOW (ref 13.0–17.0)
MCH: 28.6 pg (ref 26.0–34.0)
MCHC: 29.9 g/dL — ABNORMAL LOW (ref 30.0–36.0)
MCV: 95.8 fL (ref 80.0–100.0)
Platelets: 172 10*3/uL (ref 150–400)
RBC: 2.62 MIL/uL — ABNORMAL LOW (ref 4.22–5.81)
RDW: 16.6 % — ABNORMAL HIGH (ref 11.5–15.5)
WBC: 6 10*3/uL (ref 4.0–10.5)
nRBC: 0 % (ref 0.0–0.2)

## 2023-12-01 LAB — PHOSPHORUS: Phosphorus: 2.4 mg/dL — ABNORMAL LOW (ref 2.5–4.6)

## 2023-12-01 LAB — MAGNESIUM: Magnesium: 2.7 mg/dL — ABNORMAL HIGH (ref 1.7–2.4)

## 2023-12-01 LAB — GLUCOSE, CAPILLARY
Glucose-Capillary: 145 mg/dL — ABNORMAL HIGH (ref 70–99)
Glucose-Capillary: 151 mg/dL — ABNORMAL HIGH (ref 70–99)
Glucose-Capillary: 154 mg/dL — ABNORMAL HIGH (ref 70–99)
Glucose-Capillary: 155 mg/dL — ABNORMAL HIGH (ref 70–99)
Glucose-Capillary: 189 mg/dL — ABNORMAL HIGH (ref 70–99)
Glucose-Capillary: 227 mg/dL — ABNORMAL HIGH (ref 70–99)

## 2023-12-01 LAB — SEDIMENTATION RATE: Sed Rate: 102 mm/h — ABNORMAL HIGH (ref 0–16)

## 2023-12-01 MED ORDER — FREE WATER
150.0000 mL | Status: DC
  Administered 2023-12-01 – 2023-12-03 (×9): 150 mL

## 2023-12-01 MED ORDER — SODIUM ZIRCONIUM CYCLOSILICATE 10 G PO PACK
10.0000 g | PACK | Freq: Once | ORAL | Status: AC
  Administered 2023-12-01: 10 g
  Filled 2023-12-01: qty 1

## 2023-12-01 MED ORDER — ACETAZOLAMIDE SODIUM 500 MG IJ SOLR
500.0000 mg | Freq: Two times a day (BID) | INTRAMUSCULAR | Status: AC
  Administered 2023-12-01 – 2023-12-03 (×4): 500 mg via INTRAVENOUS
  Filled 2023-12-01 (×5): qty 500

## 2023-12-01 MED ORDER — NEPRO/CARBSTEADY PO LIQD
1000.0000 mL | ORAL | Status: DC
  Filled 2023-12-01: qty 1000

## 2023-12-01 NOTE — Progress Notes (Addendum)
 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 Palliative care consulted. BAL cx with c albicans ( likely colonizer, even though on steroids)  Assessment 33 year old male with prior history of HIV/AIDS, noncompliance with medications, epilepsy admitted with subacute cough and dyspnea, also seizure.  Currently being treated for possible severe PJP PNA with concerns for IRIS, in the setting of rapid decline of viral load with initiation of ART.   Patient had severe worsening of respite status on 6/22 requiring ICU transfer and intubation.   # Acute Hypoxemic Respiratory Failure due to Possible PJP PNA with possible IRIS complicated with possible HAP  - completed 21 days course of PJP pna treatment on 6/24 and started on  dapsone  ppx  # CMV viremia - unlikely to have an end organ disease, expected finding in poorly controlled HIV and expect to resolve with ART. No visual, neurologic or GI symptoms prior to intubation. Uncommon to have CMV pneumonitis and will need biopsy for definitive diagnosis but considered high risk, his CMV VL from BAL on 6/23 39K,  on preemptive ganciclovir    # AIDs - h/o poor compliance, VL quickly went down from 306K to 640 this admission in approx 10 days after initiation of ART with concerns for IRIS as above. CD4 still less than 35( 8%), HIV VL 50, On Biktarvy  and Prezcobix   # Low grade fevers   Recommendations - 2 sets of blood cx ordered for fevers - PCCM managing IV steroids for concerns for IRIS  - complete 7 days course of linezolid  and cefepime   - Monitor fevers, CBC and BMP - Will plan to repeat CMV next week - Standard isolation precautions Will follow peripherally this weekend, New ID team to start since Monday,   Thank you for the consult. Please page with pertinent questions or concerns.  ______________________________________________________________________ Subjective patient seen and examined at the bedside. Intubated and sedated, Mother at bedside.   Vitals BP (!) 140/106   Pulse (!) 115   Temp 99.9 F (37.7 C)   Resp (!) 26   Ht 5' 7 (1.702 m)   Wt 45.8 kg   SpO2 94%   BMI 15.81 kg/m     Physical Exam Constitutional:  adult male orotracheally intubated, opens eyes and follows    Comments:  Cardiovascular:     Rate and Rhythm: Normal rate and regular rhythm.     Heart sounds:   Pulmonary:     Effort: Pulmonary effort  normal; on vent, left CT    Comments: coarse mechanical breath sounds b/l, Fio2 90%   Abdominal:     Palpations: Abdomen is non distended    Tenderness:   Musculoskeletal:        General: No swelling or tenderness in peripheral joints   Skin:    Comments: no rashes   Neurological:     General: sedated, pupils bilaterally  symmetrical  Pertinent Microbiology Results for orders placed or performed during the hospital encounter of 11/08/23  Culture, blood (Routine X 2) w Reflex to ID Panel     Status: None   Collection Time: 11/08/23  4:18 AM   Specimen: BLOOD  Result Value Ref Range Status   Specimen Description BLOOD BLOOD RIGHT ARM  Final   Special Requests   Final    BOTTLES DRAWN AEROBIC AND ANAEROBIC Blood Culture adequate volume   Culture   Final    NO GROWTH 5 DAYS Performed at [facility], [address], [place], [place] [address]    Report Status 11/13/2023 FINAL  Final  Culture, blood (Routine X 2) w Reflex to ID Panel     Status: None   Collection Time: 11/08/23  4:22 AM   Specimen: BLOOD  Result Value Ref Range Status   Specimen Description BLOOD BLOOD RIGHT ARM  Final   Special Requests   Final    BOTTLES DRAWN AEROBIC AND ANAEROBIC Blood Culture adequate volume   Culture   Final    NO GROWTH 5 DAYS Performed at [facility], [address] [place], [place] [address]    Report Status 11/13/2023 FINAL  Final  Respiratory (~20 pathogens) panel by PCR     Status: None   Collection Time: 11/08/23  8:42 AM   Specimen: Nasopharyngeal Swab; Respiratory  Result Value Ref Range Status   Adenovirus NOT DETECTED NOT DETECTED Final   Coronavirus 229E NOT DETECTED NOT DETECTED Final    Comment: (NOTE) The Coronavirus on the Respiratory Panel, DOES NOT test for the novel  Coronavirus (2019 nCoV)    Coronavirus HKU1 NOT DETECTED NOT DETECTED Final   Coronavirus NL63 NOT DETECTED NOT DETECTED Final   Coronavirus OC43 NOT DETECTED NOT DETECTED Final   Metapneumovirus NOT DETECTED NOT DETECTED Final   Rhinovirus / Enterovirus NOT DETECTED NOT DETECTED Final   Influenza A NOT DETECTED NOT DETECTED Final   Influenza B NOT DETECTED NOT DETECTED Final   Parainfluenza Virus 1 NOT DETECTED NOT DETECTED Final   Parainfluenza Virus 2 NOT DETECTED NOT DETECTED Final   Parainfluenza Virus  3 NOT DETECTED NOT DETECTED Final   Parainfluenza Virus 4 NOT DETECTED NOT DETECTED Final   Respiratory Syncytial Virus NOT DETECTED NOT DETECTED Final   Bordetella pertussis NOT DETECTED NOT DETECTED Final   Bordetella Parapertussis NOT DETECTED NOT DETECTED Final   Chlamydophila pneumoniae NOT DETECTED NOT DETECTED Final   Mycoplasma pneumoniae NOT DETECTED NOT DETECTED Final    Comment: Performed at [facility], [address], [place], [place] [address]  Expectorated Sputum Assessment w Gram Stain, Rflx to Resp Cult     Status: None   Collection Time: 11/17/23  2:20 PM   Specimen: Expectorated Sputum  Result Value Ref Range Status   Specimen Description EXPECTORATED SPUTUM  Final   Special Requests Immunocompromised  Final   Sputum evaluation   Final    THIS SPECIMEN IS ACCEPTABLE FOR SPUTUM CULTURE Performed at [facility], [address] [place], [place] [address]    Report Status 11/17/2023 FINAL  Final  Culture, Respiratory w Gram Stain     Status: None   Collection Time: 11/17/23  2:20 PM  Result Value Ref Range Status   Specimen Description EXPECTORATED SPUTUM  Final   Special Requests Immunocompromised Reflexed from [id number]  Final   Gram Stain NO WBC SEEN FEW GRAM POSITIVE COCCI IN PAIRS   Final   Culture   Final    MODERATE Normal respiratory flora-no Staph aureus or Pseudomonas seen Performed at [facility], [address], [place], [place] [address]    Report Status 11/19/2023 FINAL  Final  Blastomyces Antigen     Status: None   Collection Time: 11/25/23  6:05 AM   Specimen: Blood  Result Value Ref Range Status   Blastomyces Antigen None Detected None Detected ng/mL Final    Comment: (NOTE) Reference Interval: None Detected Reportable Range: 0.31 ng/mL - 20.00 ng/mL Results above 20.00 ng/mL are reported as 'Positive, Above the Limit of Quantification' This test was developed and its performance characteristics determined by [employer]. It has not been cleared or approved by the FDA; however, FDA clearance or approval is not currently required for clinical use. The results are not intended to be used as the sole means for clinical diagnosis or patient decisions.    Interpretation Negative  Final   Specimen Type SERUM  Final    Comment: (NOTE) Performed At: [facility] [address] [place], [place] [id number] [place] Pac MD Ey:[id number]   MRSA Next Gen by PCR, Nasal     Status: Abnormal   Collection Time: 11/25/23 11:35 PM   Specimen: Nasal Mucosa; Nasal Swab  Result Value Ref Range Status   MRSA by PCR Next Gen DETECTED (A) NOT DETECTED Final    Comment: RESULT CALLED TO, READ BACK BY AND VERIFIED WITH: SHAFFNER,P RN 11/26/2023 AT 0218 SKEEN,P (NOTE) The GeneXpert MRSA Assay (FDA approved for NASAL specimens only), is one component of a comprehensive MRSA colonization surveillance program. It is not intended to diagnose MRSA infection nor to guide or monitor treatment for MRSA infections. Test performance is not FDA approved in patients less than 86 years old. Performed at [facility], [address], [place], [place] [address]   Culture, Respiratory w Gram Stain     Status: None   Collection Time: 11/27/23  9:27 AM   Specimen: Bronchoalveolar Lavage; Respiratory  Result Value Ref Range Status   Specimen Description BRONCHIAL ALVEOLAR LAVAGE  Final   Special Requests NONE  Final   Gram Stain   Final    NO WBC SEEN NO ORGANISMS SEEN Performed at [facility], [address] [place], [place] [address]    Culture RARE CANDIDA ALBICANS  Final   Report Status 11/30/2023 FINAL  Final  Acid Fast Smear (AFB)     Status: None   Collection Time: 11/27/23  9:27 AM   Specimen: Bronchoalveolar Lavage; Respiratory  Result Value Ref Range Status   AFB Specimen Processing Concentration  Final   Acid Fast Smear Negative  Final    Comment: (NOTE) Performed At: [facility] [address] [place], [place] [id number] Jennette Shorter MD Ey:[id number]    Source (AFB) BRONCHIAL ALVEOLAR LAVAGE  Final    Comment: Performed at [facility], [address], [place], [place] [address]  Fungus Culture With Stain     Status: None (Preliminary result)  Collection Time: 11/27/23  9:27 AM   Specimen: Bronchoalveolar Lavage; Respiratory  Result Value Ref Range Status   Fungus Stain Final report  Final    Comment: (NOTE) Performed At: [facility] [address] [place], [place] [id number] Jennette Shorter MD Ey:[id number]    Fungus (Mycology) Culture PENDING  Incomplete   Fungal Source BRONCHIAL ALVEOLAR LAVAGE  Final    Comment: Performed at [facility], [address] [place], [place] [address]  Virus culture     Status: None   Collection Time: 11/27/23  9:27 AM   Specimen: Bronchoalveolar Lavage  Result Value Ref Range Status   Viral Culture Comment  Final    Comment: (NOTE) Preliminary Report: No virus isolated at 24 hours. Next report to follow after 4 days. Performed At: [facility] [address] [place], [place] [id number] Jennette Shorter MD Ey:[id number]    Source of Sample BRONCHIAL ALVEOLAR LAVAGE  Final    Comment: Performed at [facility], [address] [place], [place] [address]  Fungus Culture Result     Status: None   Collection Time: 11/27/23  9:27 AM  Result Value Ref Range Status   Result 1 Comment  Final    Comment: (NOTE) KOH/Calcofluor preparation:  no fungus observed. Performed At: [facility] [address] [place], [place] [id number] Jennette Shorter MD Ey:[id number]    Pertinent Lab.    Latest Ref Rng & Units 12/01/2023    3:00 AM 11/30/2023    2:53 AM 11/29/2023    3:36 AM  CBC  WBC 4.0 - 10.5 K/uL 6.0  10.5  8.5   Hemoglobin 13.0 - 17.0 g/dL 7.5  7.8  7.4   Hematocrit 39.0 - 52.0 % 25.1  24.8  21.8   Platelets 150 - 400 K/uL 172  197  207       Latest Ref Rng &  Units 12/01/2023    3:00 AM 11/30/2023    2:53 AM 11/29/2023    5:22 PM  CMP  Glucose 70 - 99 mg/dL 855  839    BUN 6 - 20 mg/dL 36  41    Creatinine 9.38 - 1.24 mg/dL 9.27  9.19    Sodium 864 - 145 mmol/L 143  141    Potassium 3.5 - 5.1 mmol/L 5.5  5.2  4.2   Chloride 98 - 111 mmol/L 85  87    CO2 22 - 32 mmol/L >45  43    Calcium  8.9 - 10.3 mg/dL 8.6  8.3     Pertinent Imaging today Plain films and CT images have been personally visualized and interpreted; radiology reports have been reviewed. Decision making incorporated into the Impression /   DG CHEST PORT 1 VIEW Result Date: 11/30/2023 CLINICAL DATA:  Chest tube placement. EXAM: PORTABLE CHEST 1 VIEW COMPARISON:  Same day. FINDINGS: Interval placement of large bore left-sided chest tube with nearly complete resolution left pneumothorax. Subcutaneous emphysema is noted over left lateral chest wall. IMPRESSION: Interval placement of left-sided large-bore chest tube with nearly complete resolution of left pneumothorax. Electronically Signed   By: Lynwood Landy Raddle M.D.   On: 11/30/2023 11:26   I spent 51 minutes involved in face-to-face and non-face-to-face activities for this patient on the day of the visit. Professional time spent includes the following activities: Preparing to see the patient (review of tests),  Performing a medically appropriate examination and evaluation , Ordering medications, referring and communicating with other health care professionals ICU,  Documenting clinical information in the EMR, Independently interpreting results (not separately reported), Communicating results to mother, Counseling and educating the mother and Care coordination (not separately reported).   Plan d/w requesting provider as well as ID pharm D  Of note, portions of this note may have been created with voice recognition software. While this note has been edited for accuracy, occasional wrong-word or 'sound-a-like' substitutions may have occurred  due to the inherent limitations of voice recognition software.   Electronically signed by:   Annalee Orem, MD Infectious Disease Physician [facility] Pager: [phone]

## 2023-12-01 NOTE — Progress Notes (Signed)
 NAME:  Cory Russell, MRN:  [id number], DOB:  1991/05/21, LOS: 23 ADMISSION DATE:  11/08/2023, CONSULTATION DATE: 11/19/2023 REFERRING MD:  Jonel - TRH, CHIEF COMPLAINT:  Respiratory failure   History of Present Illness:  Cory Russell is a 33 y.o. man with past medical history of HIV/AIDS, CD4<35 on admission, not on ARVs. Presents with seizure like activity 11 days ago and found to have sepsis from pneumonia.  He notes symptoms started about 2-3 weeks ago with shortness of breath and dry cough. He had two separate rounds of antibiotics visiting other urgent care and ED visits. He has tried albuterol  nebulizer treatments from friends and initially found them helpful. CT Chest shows bilateral ground glass opacities concerning for pneumoncystis pneumonia. Since being here he has been Started on empiric antibiotics with bactrim  and steroids. Also resumed ARVs during this admission. Has been persistently hypoxemic on 10LNC. Pulmonary consulted to assist with evaluation and management and for bronchoscopy.   He feels persistently short of breath, worse with exertion. He feels like chest PT is helping him. Feeling more anxious. Also having difficulty taking pills.   Pertinent Medical History:  HIV, Seizure disorder  Significant Hospital Events: Including procedures, antibiotic start and stop dates in addition to other pertinent events   6/04 Admit 6/15 Pulmonary consult 6/18 Transient desats with worsening chest x-ray.  Prednisone  changed to IV Solu-Medrol  6/22 Worsening respiratory status requiring ICU transfer and intubation 6/23 Bronch for BAL per ID request. NO being weaned from 20 to 15. Overnight L PTX s/p chest tube. 6/25 Required transition to SIMV vent mode due to dyssynchrony. Sedation increased. Paralytic push given. CXR improved s/p diuresis. Required secondary L pigtail for persistent PTX.  6/26 Large bore chest tube placed, pigtail removed.  Interim History / Subjective:  No  significant events overnight Persistent high vent requirements, SIMV PEEP 8, FiO2 90%, good tidal volumes Not requiring paralytic, but requiring high levels of sedation Large-bore CT placed with significant improvement in L PTX, persistent air leak Multiple loose stools, Flexi ordered  Objective:   Blood pressure 120/81, pulse 95, temperature 100.2 F (37.9 C), resp. rate (!) 23, height 5' 7 (1.702 m), weight 45.8 kg, SpO2 96%.    Vent Mode: SIMV;PRVC;PSV FiO2 (%):  [90 %] 90 % Set Rate:  [18 bmp] 18 bmp Vt Set:  [410 mL] 410 mL PEEP:  [8 cmH20] 8 cmH20 Plateau Pressure:  [28 cmH20-33 cmH20] 28 cmH20   Intake/Output Summary (Last 24 hours) at 12/01/2023 0738 Last data filed at 12/01/2023 0700 Gross per 24 hour  Intake 3440.34 ml  Output 4415 ml  Net -974.66 ml   Filed Weights   11/29/23 0418 11/30/23 0500 12/01/23 0415  Weight: 50 kg 51.9 kg 45.8 kg   Physical Examination: General: Acutely ill-appearing young man in NAD. Appears uncomfortable. HEENT: [place]/AT, anicteric sclera, PERRL 2mm sluggish, moist mucous membranes. Copious clear oral secretions. Neuro: Intubated, sedated. Does not respond to verbal, tactile or noxious stimuli. Not following commands. Moves all 4 extremities spontaneously. +Corneal, +Cough, and +Gag  CV: Mildly tachycardic, regular rhythm, no m/g/r. PULM: Breathing intermittently tachypneic to 20s, mildly labored/occasionally paradoxical breathing on vent (SIMV, PEEP 8, FiO2 90%). Lung fields coarse throughout. Large bore chest tube (32Fr) to L pleural space, +persistent air leak. GI: Soft, nontender, nondistended. Normoactive bowel sounds. Extremities: No significant LE edema noted. Skin: Warm/dry, no rashes.  Assessment and Plan:   Acute hypoxemic & hypercapnic respiratory failure with ARDS Concern for superimposed HAP vs aspiration to  explain his worsening several weeks into his hospitalization - CTA 6/10 neg, LE duplex 6/23 neg. Severe Pneumocystis  pneumonia Suspected IRIS New L PTX overnight 6/23 - s/p pigtail chest tube x 2, then large bore chest tube 6/26 Bronch/BAL 6/23: Cx NG, AFB smear negative; viral Cx negative, CMV quant 39,400 (11,800). Echo Limited 6/25 with hyperdynamic LV/RV EF 70-75%, no shunt noted. NO discontinued 6/23, Nimbex  discontinued 6/24. Required intermittent paralytic pushes.  - Continue full vent support (4-8cc/kg IBW); goal Pplat < 30, driving pressures < 15 - Wean FiO2 for O2 sat > 90% - Daily WUA/SBT; not a candidate at present due to persistent PTX, vent dyssynchrony - VAP bundle - Bronchodilators as ordered - Consider increase in steroid dose, given uptrending ESR/?worsening IRIS - Pulmonary hygiene - PAD protocol for sedation: Precedex , Propofol , and Fentanyl  for goal RASS -1 to -2 - Paralytic pushes PRN - F/u AFB Cx, pneumocystis smear; fungal Cx prelim negative - Broad-spectrum antibiotics (cefepime /linezolid ), PJP treatment (clinda + Primaquine ) - Continue ganciclovir  for CMV treatment, repeat CMV quant 6/30 - ID following, appreciate assistance - Follow CXR - CT management per protocol  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 HIV-1 RNA 50. - ID following, appreciate assistance - Continue Biktarvy , Prezcobix  - Ganciclovir  as above  Hyperkalemia, partially due to Bactrim  - s/p temporizing measures and Bactrim  d/c 6/23 Hyponatremia  AKI - Trend BMP - Replete electrolytes as indicated - Diuresis as tolerated, Lasix  BID - Lokelma  to be given today for hyperK - Monitor I&Os - Avoid nephrotoxic agents as able - Ensure adequate renal perfusion  Chronic anemia, likely due to HIV, acute illness - Trend H&H - Monitor for signs of active bleeding - Transfuse for Hgb < 7.0 or hemodynamically significant bleeding  Seizure Disorder - Continue Depakote  - Versed  PRN  Hyperglycemia; pre-DM. A1c 6.4 - SSI - CBGs Q4H - Goal CBG 140-180  Severe protein energy  malnutrition Cachexia - Appreciate Nutrition/RD assistance  Best Practice (right click and Reselect all SmartList Selections daily)   Diet/type: TF DVT prophylaxis prophylactic heparin   Pressure ulcer(s): N/A GI prophylaxis: H2B Lines: arterial line Foley:  Yes, and it is still needed Code Status:  full code Last date of multidisciplinary goals of care discussion [Patient affirmed he wants full code and all life support options 6/22]  Critical care time:   The patient is critically ill with multiple organ system failure and requires high complexity decision making for assessment and support, frequent evaluation and titration of therapies, advanced monitoring, review of radiographic studies and interpretation of complex data.   Critical Care Time devoted to patient care services, exclusive of separately billable procedures, described in this note is 35 minutes.  Corean CHRISTELLA Maliki Gignac, PA-C [facility] 12/01/23 7:38 AM  Please see [email or web address] for pager details.  From 7A-7P if no response, please call [phone] After hours, please call ELink [phone]

## 2023-12-01 NOTE — Progress Notes (Signed)
 Nutrition Follow-up  DOCUMENTATION CODES:   Severe malnutrition in context of chronic illness, Underweight  INTERVENTION:   Tube Feeding via Cortrak: TF hourly rate increased to account for time when TF on hold Nepro at 55 ml/hr x 20 hours (per Pharmacy, TF to be held with Tivicay  admin) Pro-Source TF20 60 mL daily TF provides 2060 kcals, 109 g of protein 803 mL of free water  Add free water flush of 150 mL g 4 hours; provides additional 900 mL of free water.   If diarrhea persists, recommend changing TF formula. Pt receiving 25 g of fiber in Nepro formula  Additional lipid calories being provided via Propofol   NUTRITION DIAGNOSIS:   Severe Malnutrition related to chronic illness (HIV) as evidenced by severe muscle depletion, severe fat depletion.  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  6/04 Admitted 6/22 Intubated, TF initiated at 10, goal 40 6/23 Gastric Cortrak placed, Chest tube overnight for tension pneumothorax 6/25 Secondary Pigtail CT placed 6/26 Large bore chest tube placed, Pigtail removed  Pt remains on vent support, FiO2 90%, PEEP 8. Requiring lots of sedation, remains off paralytic  Tolerating Nepro TF at rate of 45 ml/hr via Cortrak Pharmacy reached out requesting rate adjustment for TF so that we can account for the 4 hours around Tivicay  administration when TF should be held. Of note, orders for TF have been and place per admin instructions but not being held. Process issue. Discussed concerns with Pharmcist  Hyperkalemia remains  an issue; noted receiving lasix  for diuresis. Lokelma  held yesterday but resumed today  BUN elevated slightly with normal Creatinine. Noted sodium trending up. Plan to add additional free water  Multiple loose stools, noted FMS ordered.   Current wt 45.8 kg; lowest wt since admission however noted wt yesterday 51.9 kg. ?current dry wt. Wt 6/11 49 kg  Labs: Potassium 5.5 H() Sodium 143 (wdl) BUN 36 (H) Phosphorus 2.4 (L)  Meds:  SS novolog  Hydrocortisone  Pepcid  Colace Solumedrol MVI with Minerals Miralax  Tivicay     Diet Order:   Diet Order             Diet NPO time specified  Diet effective now                   EDUCATION NEEDS:   No education needs have been identified at this time  Skin:  Skin Assessment: Reviewed RN Assessment  Last BM:  6/27 diarrhea, FMS inserted  Height:   Ht Readings from Last 1 Encounters:  11/30/23 5' 7 (1.702 m)    Weight:   Wt Readings from Last 1 Encounters:  12/01/23 45.8 kg    BMI:  Body mass index is 15.81 kg/m.  Estimated Nutritional Needs:   Kcal:  2000-2200 kcals  Protein:  110-130 g  Fluid:  >1.8 L/day   Betsey Finger MS, RDN, LDN, CNSC Registered Dietitian 3 Clinical Nutrition RD Inpatient Contact Info in Amion

## 2023-12-01 NOTE — Plan of Care (Signed)
  Problem: Pain Managment: Goal: General experience of comfort will improve and/or be controlled Outcome: Progressing   Problem: Respiratory: Goal: Ability to maintain a clear airway and adequate ventilation will improve Outcome: Progressing   Problem: Fluid Volume: Goal: Ability to maintain a balanced intake and output will improve Outcome: Progressing   Problem: Nutritional: Goal: Maintenance of adequate nutrition will improve Outcome: Progressing   Problem: Skin Integrity: Goal: Risk for impaired skin integrity will decrease Outcome: Progressing

## 2023-12-01 NOTE — Progress Notes (Signed)
 Pharmacy: Antimicrobial Stewardship Note  20 YOM with HIV - noncompliant with ART regimen - CD4 <35 on 6/4 s/p 21 days of treatment presumed PJP PNA given low CD4 count and fungitell >500.   The patient completed PJP treatment on 6/24 and was started on dapsone  starting 6/25 for PJP prophylaxis while CD4 count <200. Avoiding bactrim  due to concerns with hyperkalemia earlier this admit. G6PD wnl.  In regards to ART - okay to continue Biktarvy  + Prezcobix  PT. Biktarvy  should be dissolved in water for PT administration. MV notably timed away from Biktarvy  administration. Rec to hold TFs 2 hours before/after Biktarvy  administration.   Noted tube feeds do not appear to be held - will add extra communication to RN staff about this. Discussed with RD and adjusted TF goal rate to account for 4 hour off time  Ganciclovir  started 6/22 by CCM for elevated CMV levels - per Dr. Manandhar, to continue for now give rising CMV levels. Plan to recheck levels again next week.   Continues on Linezolid  and Cefepime  for HAP coverage pending cultures. 6/23 BAL showing candida albicans - likely a colonizer. 7d stop date added for 6/28. Noted repeat blood cultures ordered 6/27 due to fevers.   Plan - Continue Linezolid  600 mg PT bid and Cefepime  to 2g IV every 8 hours - stop date 6/28 for 7d LOT - Continue Ganciclovir  250 mg (~5 mg/kg) IV every 12 hours - Continue Biktarvy  + Prezcobix  daily PT - Hold tube feeds 2 hours before and after biktarvy  administration - Continue dapsone  100 mg PT once daily on 6/25 for PJP prophylaxis - Will monitor progression, renal function, culture/testing updates  Thank you for allowing pharmacy to be a part of this patient's care.  Almarie Lunger, PharmD, BCPS, BCIDP Infectious Diseases Clinical Pharmacist 12/01/2023 1:12 PM   **Pharmacist phone directory can now be found on [email or web address] ([email or web address]).  Listed under [facility].

## 2023-12-02 DIAGNOSIS — E875 Hyperkalemia: Secondary | ICD-10-CM | POA: Diagnosis not present

## 2023-12-02 DIAGNOSIS — J9601 Acute respiratory failure with hypoxia: Secondary | ICD-10-CM | POA: Diagnosis not present

## 2023-12-02 DIAGNOSIS — Z7189 Other specified counseling: Secondary | ICD-10-CM | POA: Diagnosis not present

## 2023-12-02 DIAGNOSIS — J80 Acute respiratory distress syndrome: Secondary | ICD-10-CM | POA: Diagnosis not present

## 2023-12-02 DIAGNOSIS — E871 Hypo-osmolality and hyponatremia: Secondary | ICD-10-CM | POA: Diagnosis not present

## 2023-12-02 DIAGNOSIS — Z515 Encounter for palliative care: Secondary | ICD-10-CM | POA: Diagnosis not present

## 2023-12-02 DIAGNOSIS — B20 Human immunodeficiency virus [HIV] disease: Secondary | ICD-10-CM | POA: Diagnosis not present

## 2023-12-02 LAB — BASIC METABOLIC PANEL WITH GFR
Anion gap: 8 (ref 5–15)
BUN: 37 mg/dL — ABNORMAL HIGH (ref 6–20)
CO2: 42 mmol/L — ABNORMAL HIGH (ref 22–32)
Calcium: 8.9 mg/dL (ref 8.9–10.3)
Chloride: 92 mmol/L — ABNORMAL LOW (ref 98–111)
Creatinine, Ser: 0.69 mg/dL (ref 0.61–1.24)
GFR, Estimated: >60 mL/min (ref >60)
Glucose, Bld: 141 mg/dL — ABNORMAL HIGH (ref 70–99)
Potassium: 4.1 mmol/L (ref 3.5–5.1)
Sodium: 142 mmol/L (ref 135–145)

## 2023-12-02 LAB — GLUCOSE, CAPILLARY
Glucose-Capillary: 162 mg/dL — ABNORMAL HIGH (ref 70–99)
Glucose-Capillary: 164 mg/dL — ABNORMAL HIGH (ref 70–99)
Glucose-Capillary: 182 mg/dL — ABNORMAL HIGH (ref 70–99)
Glucose-Capillary: 196 mg/dL — ABNORMAL HIGH (ref 70–99)
Glucose-Capillary: 240 mg/dL — ABNORMAL HIGH (ref 70–99)

## 2023-12-02 LAB — POCT I-STAT 7, (LYTES, BLD GAS, ICA,H+H)
Acid-Base Excess: 15 mmol/L — ABNORMAL HIGH (ref 0.0–2.0)
Bicarbonate: 43.5 mmol/L — ABNORMAL HIGH (ref 20.0–28.0)
Calcium, Ion: 1.26 mmol/L (ref 1.15–1.40)
HCT: 31 % — ABNORMAL LOW (ref 39.0–52.0)
Hemoglobin: 10.5 g/dL — ABNORMAL LOW (ref 13.0–17.0)
O2 Saturation: 98 %
Patient temperature: 37.6
Potassium: 3.9 mmol/L (ref 3.5–5.1)
Sodium: 136 mmol/L (ref 135–145)
TCO2: 46 mmol/L — ABNORMAL HIGH (ref 22–32)
pCO2 arterial: 81.5 mmHg (ref 32–48)
pH, Arterial: 7.338 — ABNORMAL LOW (ref 7.35–7.45)
pO2, Arterial: 113 mmHg — ABNORMAL HIGH (ref 83–108)

## 2023-12-02 LAB — CBC
HCT: 26.6 % — ABNORMAL LOW (ref 39.0–52.0)
Hemoglobin: 7.9 g/dL — ABNORMAL LOW (ref 13.0–17.0)
MCH: 29.3 pg (ref 26.0–34.0)
MCHC: 29.7 g/dL — ABNORMAL LOW (ref 30.0–36.0)
MCV: 98.5 fL (ref 80.0–100.0)
Platelets: 208 10*3/uL (ref 150–400)
RBC: 2.7 MIL/uL — ABNORMAL LOW (ref 4.22–5.81)
RDW: 16.4 % — ABNORMAL HIGH (ref 11.5–15.5)
WBC: 8 10*3/uL (ref 4.0–10.5)
nRBC: 0 % (ref 0.0–0.2)

## 2023-12-02 LAB — MAGNESIUM: Magnesium: 2.2 mg/dL (ref 1.7–2.4)

## 2023-12-02 LAB — PHOSPHORUS: Phosphorus: 1.4 mg/dL — ABNORMAL LOW (ref 2.5–4.6)

## 2023-12-02 MED ORDER — HYDROCORTISONE (PERIANAL) 2.5 % EX CREA: TOPICAL_CREAM | CUTANEOUS | Status: DC | PRN

## 2023-12-02 MED ORDER — STERILE WATER FOR INJECTION IJ SOLN
INTRAMUSCULAR | Status: AC
  Filled 2023-12-02: qty 10

## 2023-12-02 MED ORDER — METHYLPREDNISOLONE SODIUM SUCC 125 MG IJ SOLR
60.0000 mg | Freq: Two times a day (BID) | INTRAMUSCULAR | Status: DC
  Administered 2023-12-02 – 2023-12-04 (×4): 60 mg via INTRAVENOUS
  Filled 2023-12-02 (×4): qty 2

## 2023-12-02 MED ORDER — SODIUM PHOSPHATES 45 MMOLE/15ML IV SOLN
30.0000 mmol | Freq: Once | INTRAVENOUS | Status: AC
  Administered 2023-12-02: 30 mmol via INTRAVENOUS
  Filled 2023-12-02: qty 10

## 2023-12-02 MED ORDER — MIDAZOLAM-SODIUM CHLORIDE 100-0.9 MG/100ML-% IV SOLN
0.5000 mg/h | INTRAVENOUS | Status: DC
  Administered 2023-12-02: 4 mg/h via INTRAVENOUS
  Administered 2023-12-02 – 2023-12-06 (×7): 8 mg/h via INTRAVENOUS
  Filled 2023-12-02 (×8): qty 100

## 2023-12-02 NOTE — Progress Notes (Signed)
 NAME:  Cory Russell, MRN:  [id number], DOB:  29-Mar-1991, LOS: 24 ADMISSION DATE:  11/08/2023, CONSULTATION DATE: 11/19/2023 REFERRING MD:  Jonel - TRH, CHIEF COMPLAINT:  Respiratory failure   History of Present Illness:  Cory Russell is a 33 y.o. man with past medical history of HIV/AIDS, CD4<35 on admission, not on ARVs. Presents with seizure like activity 11 days ago and found to have sepsis from pneumonia.  He notes symptoms started about 2-3 weeks ago with shortness of breath and dry cough. He had two separate rounds of antibiotics visiting other urgent care and ED visits. He has tried albuterol  nebulizer treatments from friends and initially found them helpful. CT Chest shows bilateral ground glass opacities concerning for pneumoncystis pneumonia. Since being here he has been Started on empiric antibiotics with bactrim  and steroids. Also resumed ARVs during this admission. Has been persistently hypoxemic on 10LNC. Pulmonary consulted to assist with evaluation and management and for bronchoscopy.   He feels persistently short of breath, worse with exertion. He feels like chest PT is helping him. Feeling more anxious. Also having difficulty taking pills.   Pertinent Medical History:  HIV, Seizure disorder  Significant Hospital Events: Including procedures, antibiotic start and stop dates in addition to other pertinent events   6/04 Admit 6/15 Pulmonary consult 6/18 Transient desats with worsening chest x-ray.  Prednisone  changed to IV Solu-Medrol  6/22 Worsening respiratory status requiring ICU transfer and intubation 6/23 Bronch for BAL per ID request. NO being weaned from 20 to 15. Overnight L PTX s/p chest tube. 6/25 Required transition to SIMV vent mode due to dyssynchrony. Sedation increased. Paralytic push given. CXR improved s/p diuresis. Required secondary L pigtail for persistent PTX.  6/26 Large bore chest tube placed, pigtail removed.  Interim History / Subjective:  No  overnight issues FiO2 remain at 80% and PEEP of 5 Still tachypneic with increased work of breathing and use of accessory muscles Diuresing well  Objective:   Blood pressure 122/83, pulse (!) 105, temperature 100.2 F (37.9 C), resp. rate (!) 24, height 5' 7 (1.702 m), weight 44.6 kg, SpO2 92%.    Vent Mode: SIMV;PRVC;PSV FiO2 (%):  [80 %-90 %] 80 % Set Rate:  [18 bmp] 18 bmp Vt Set:  [410 mL] 410 mL PEEP:  [8 cmH20] 8 cmH20 Pressure Support:  [10 cmH20] 10 cmH20 Plateau Pressure:  [25 cmH20-34 cmH20] 34 cmH20   Intake/Output Summary (Last 24 hours) at 12/02/2023 1003 Last data filed at 12/02/2023 0630 Gross per 24 hour  Intake 1381.5 ml  Output 3859 ml  Net -2477.5 ml   Filed Weights   11/30/23 0500 12/01/23 0415 12/02/23 0701  Weight: 51.9 kg 45.8 kg 44.6 kg   Physical Examination: General: Crtitically ill-appearing male, orally intubated HEENT: [place]/AT, eyes anicteric.  ETT and cortrak in place Neuro: Opens eyes with vocal stimuli, not following commands, tracking examiner Chest: Tachypneic, coarse breath sounds, no wheezes or rhonchi Heart: Tachycardic, regular rhythm, no murmurs or gallops Abdomen: Soft, nondistended, bowel sounds present  Labs and images reviewed  Patient Lines/Drains/Airways Status     Active Line/Drains/Airways     Name Placement date Placement time Site Days   Peripheral IV 11/19/23 20 G Anterior;Right Forearm 11/19/23  1338  Forearm  13   Peripheral IV 11/26/23 20 G Left Forearm 11/26/23  0105  Forearm  6   Chest Tube 1 Lateral;Left Pleural 32 Fr. 11/30/23  1130  Pleural  2   Urethral Catheter Natalie Snow,RN 16 Fr. 11/26/23  1415  --  6   Fecal Management System 35 mL 12/01/23  1359  -- 1   Airway 7.5 mm 11/26/23  0017  -- 6   Small Bore Feeding Tube 10 Fr. 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 Hyperkalemia, hyponatremia resolved Hypophosphatemia Acute kidney injury, resolved Seizure disorder Prediabetes with hyperglycemia Anemia of critical illness Severe protein calorie malnutrition  Continue lung protective ventilation VAP prevention bundle in place PAD protocol, switch propofol  and Precedex  to Versed  infusion Continue fentanyl  infusion with RASS goal -1 Hypercapnia has cleared FiO2 still remain at 80% and PEEP at 5 Closely monitor chest tube, he has large air leak Continue on suction Completed therapy with Bactrim  for PJP pneumonia Continue dapsone  as PJP prophylaxis Repeat ABGs pending, titrate FiO2 with O2 sat goal 92% Continue ganciclovir  Continue Zithromycin for MAC prophylaxis  ID is following Continue ART Continue aggressive electrolyte replacement Serum creatinine is at baseline Continue to monitor Avoid nephrotoxic agent Continue valproic  acid Continue insulin  with CBG goal 140-180 Monitor H&H Continue dietary supplements, currently on tube feeds   Best Practice (right click and Reselect all SmartList Selections daily)   Diet/type: TF DVT prophylaxis prophylactic heparin   Pressure ulcer(s): N/A GI prophylaxis: H2B Lines: arterial line Foley:  Yes, and it is still needed Code Status:  full code Last date of multidisciplinary goals of care discussion: 6/28, patient's mother was updated at bedside, options were given either to proceed with tracheostomy versus comfort care, she will speak with her family, will be able to make decision in next few days   The patient is critically ill due to acute respiratory failure with hypoxia and hypercapnia/HIV AIDS.  Critical care was necessary to treat or prevent imminent or life-threatening deterioration.  Critical care was  time spent personally by me on the following activities: development of treatment plan with patient and/or surrogate as well as nursing, discussions with consultants, evaluation of patient's response to treatment, examination of patient, obtaining history from patient or surrogate, ordering and performing treatments and interventions, ordering and review of laboratory studies, ordering and review of radiographic studies, pulse oximetry, re-evaluation of patient's condition and participation in multidisciplinary rounds.   During this encounter critical care time was devoted to patient care services described in this note for 39 minutes.     Valinda Novas, MD [facility] See Amion for pager If no response to pager, please call [phone] until 7pm After 7pm, Please call E-link [phone]

## 2023-12-02 NOTE — Progress Notes (Signed)
 Palliative Medicine Progress Note   Patient Name: Cory Russell       Date: 12/02/2023 DOB: 05/11/1991  Age: 33 y.o. MRN#: [id number] Attending Physician: Harold Scholz, MD Primary Care Physician: Luiz Channel, MD Admit Date: 11/08/2023  Reason for Consultation/Follow-up: {Reason for Consult:23484}  HPI/Patient Profile: 33 y.o. male  with past medical history of HIV/AIDS, CD4<35 on admission, not on ARVs who presented to the ED with seizure activity on 11/08/2023 and is admitted with acute respiratory failure with ARDS and severe pneumocystis pneumonia.    Palliative Medicine has been consulted for goals of care discussions. Patient and family are faced with anticipatory care needs and complex medical decision making.   Subjective: ***  Objective:  Physical Exam Vitals reviewed.  Constitutional:      General: He is not in acute distress.    Appearance: He is ill-appearing.     Interventions: He is sedated.  Pulmonary:     Comments: Intubated Left chest tube present            Vital Signs: BP 101/66   Pulse (!) 101   Temp 97.7 F (36.5 C)   Resp 18   Ht 5' 7 (1.702 m)   Wt 44.6 kg   SpO2 96%   BMI 15.40 kg/m  SpO2: SpO2: 96 % O2 Device: O2 Device: Ventilator O2 Flow Rate: O2 Flow Rate (L/min): 30 L/min  Intake/output summary:  Intake/Output Summary (Last 24 hours) at 12/02/2023 1230 Last data filed at 12/02/2023 1000 Gross per 24 hour  Intake 2539.26 ml  Output 3834 ml  Net -1294.74 ml    LBM: Last BM Date : 12/02/23     Palliative Assessment/Data: ***     Palliative Medicine Assessment & Plan   Assessment: Principal Problem:   Severe sepsis due to PJP pneumonia Active Problems:   Depression   History of syphilis   Seizure disorder (HCC)    Protein-calorie malnutrition, severe   AIDS (acquired immune deficiency syndrome) (HCC)   Condyloma acuminata   Acute hypoxic respiratory failure (HCC)   Hyponatremia   Hyperkalemia   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)   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   ***   Recardo KATHEE Loll, NP   Please contact Palliative Medicine Team phone at [phone] for questions and concerns.  For individual providers, please see AMION.

## 2023-12-02 NOTE — Progress Notes (Signed)
 Spoke with Primary RN about placement of a midline. The medications that the patient is receiving at this time is not appropriate to run through a midline because they are vesicants. Recommended PICC line placement due to limited venous access and IV medications needed. Patient has two working PIVs at this time. MD aware.

## 2023-12-02 NOTE — Plan of Care (Signed)
  Problem: Clinical Measurements: Goal: Ability to maintain clinical measurements within normal limits will improve Outcome: Progressing Goal: Diagnostic test results will improve Outcome: Progressing Goal: Respiratory complications will improve Outcome: Progressing Goal: Cardiovascular complication will be avoided Outcome: Progressing   Problem: Activity: Goal: Risk for activity intolerance will decrease Outcome: Progressing   Problem: Nutrition: Goal: Adequate nutrition will be maintained Outcome: Progressing   Problem: Elimination: Goal: Will not experience complications related to urinary retention Outcome: Progressing

## 2023-12-02 NOTE — Progress Notes (Signed)
   12/02/23 2202  Spiritual Encounters  Type of Visit Follow up  Care provided to: [facility] partners present during encounter Nurse  Referral source Clinical staff  Reason for visit Routine spiritual support  OnCall Visit Yes  Interventions  Spiritual Care Interventions Made Prayer  Intervention Outcomes  Outcomes Awareness of health;Reduced anxiety;Connection to values and goals of care   Chaplain followed up with the patient; the Pt mother was present in the room. Chaplain embraced the mother and engaged in conversation to see how her son was doing. Chaplain and mother shared a meaningful moment and had a word of prayer together.

## 2023-12-03 DIAGNOSIS — B20 Human immunodeficiency virus [HIV] disease: Secondary | ICD-10-CM | POA: Diagnosis not present

## 2023-12-03 DIAGNOSIS — E871 Hypo-osmolality and hyponatremia: Secondary | ICD-10-CM | POA: Diagnosis not present

## 2023-12-03 DIAGNOSIS — J80 Acute respiratory distress syndrome: Secondary | ICD-10-CM | POA: Diagnosis not present

## 2023-12-03 DIAGNOSIS — E875 Hyperkalemia: Secondary | ICD-10-CM | POA: Diagnosis not present

## 2023-12-03 LAB — BLOOD GAS, ARTERIAL
Acid-Base Excess: 18 mmol/L — ABNORMAL HIGH (ref 0.0–2.0)
Bicarbonate: 50.3 mmol/L — ABNORMAL HIGH (ref 20.0–28.0)
O2 Saturation: 100 %
Patient temperature: 37.7
pCO2 arterial: 110 mmHg (ref 32–48)
pH, Arterial: 7.27 — ABNORMAL LOW (ref 7.35–7.45)
pO2, Arterial: 155 mmHg — ABNORMAL HIGH (ref 83–108)

## 2023-12-03 LAB — BASIC METABOLIC PANEL WITH GFR
Anion gap: 9 (ref 5–15)
BUN: 41 mg/dL — ABNORMAL HIGH (ref 6–20)
CO2: 37 mmol/L — ABNORMAL HIGH (ref 22–32)
Calcium: 8.9 mg/dL (ref 8.9–10.3)
Chloride: 95 mmol/L — ABNORMAL LOW (ref 98–111)
Creatinine, Ser: 0.67 mg/dL (ref 0.61–1.24)
GFR, Estimated: >60 mL/min (ref >60)
Glucose, Bld: 194 mg/dL — ABNORMAL HIGH (ref 70–99)
Potassium: 4.2 mmol/L (ref 3.5–5.1)
Sodium: 141 mmol/L (ref 135–145)

## 2023-12-03 LAB — PHOSPHORUS: Phosphorus: 4.6 mg/dL (ref 2.5–4.6)

## 2023-12-03 LAB — CBC
HCT: 26.7 % — ABNORMAL LOW (ref 39.0–52.0)
Hemoglobin: 8 g/dL — ABNORMAL LOW (ref 13.0–17.0)
MCH: 29.1 pg (ref 26.0–34.0)
MCHC: 30 g/dL (ref 30.0–36.0)
MCV: 97.1 fL (ref 80.0–100.0)
Platelets: 213 10*3/uL (ref 150–400)
RBC: 2.75 MIL/uL — ABNORMAL LOW (ref 4.22–5.81)
RDW: 16.3 % — ABNORMAL HIGH (ref 11.5–15.5)
WBC: 9 10*3/uL (ref 4.0–10.5)
nRBC: 0 % (ref 0.0–0.2)

## 2023-12-03 LAB — TRIGLYCERIDES: Triglycerides: 268 mg/dL — ABNORMAL HIGH (ref <150)

## 2023-12-03 LAB — MAGNESIUM: Magnesium: 2.3 mg/dL (ref 1.7–2.4)

## 2023-12-03 LAB — GLUCOSE, CAPILLARY
Glucose-Capillary: 170 mg/dL — ABNORMAL HIGH (ref 70–99)
Glucose-Capillary: 188 mg/dL — ABNORMAL HIGH (ref 70–99)
Glucose-Capillary: 183 mg/dL — ABNORMAL HIGH (ref 70–99)

## 2023-12-03 MED ORDER — STERILE WATER FOR INJECTION IJ SOLN
INTRAMUSCULAR | Status: AC
  Filled 2023-12-03: qty 10

## 2023-12-03 MED ORDER — METOPROLOL TARTRATE 12.5 MG HALF TABLET
12.5000 mg | ORAL_TABLET | Freq: Three times a day (TID) | ORAL | Status: DC
  Administered 2023-12-03 – 2023-12-05 (×7): 12.5 mg
  Filled 2023-12-03 (×7): qty 1

## 2023-12-03 MED ORDER — ACETAZOLAMIDE 250 MG PO TABS
500.0000 mg | ORAL_TABLET | Freq: Two times a day (BID) | ORAL | Status: AC
  Administered 2023-12-03 (×2): 500 mg via ORAL
  Filled 2023-12-03 (×2): qty 2

## 2023-12-03 MED ORDER — FREE WATER
100.0000 mL | Status: DC
  Administered 2023-12-03 – 2023-12-05 (×12): 100 mL

## 2023-12-03 NOTE — Progress Notes (Signed)
 NAME:  Cory Russell, MRN:  [id number], DOB:  26-Feb-1991, LOS: 25 ADMISSION DATE:  11/08/2023, CONSULTATION DATE: 11/19/2023 REFERRING MD:  Jonel - TRH, CHIEF COMPLAINT:  Respiratory failure   History of Present Illness:  Cory Russell is a 33 y.o. man with past medical history of HIV/AIDS, CD4<35 on admission, not on ARVs. Presents with seizure like activity 11 days ago and found to have sepsis from pneumonia.  He notes symptoms started about 2-3 weeks ago with shortness of breath and dry cough. He had two separate rounds of antibiotics visiting other urgent care and ED visits. He has tried albuterol  nebulizer treatments from friends and initially found them helpful. CT Chest shows bilateral ground glass opacities concerning for pneumoncystis pneumonia. Since being here he has been Started on empiric antibiotics with bactrim  and steroids. Also resumed ARVs during this admission. Has been persistently hypoxemic on 10LNC. Pulmonary consulted to assist with evaluation and management and for bronchoscopy.   He feels persistently short of breath, worse with exertion. He feels like chest PT is helping him. Feeling more anxious. Also having difficulty taking pills.   Pertinent Medical History:  HIV, Seizure disorder  Significant Hospital Events: Including procedures, antibiotic start and stop dates in addition to other pertinent events   6/04 Admit 6/15 Pulmonary consult 6/18 Transient desats with worsening chest x-ray.  Prednisone  changed to IV Solu-Medrol  6/22 Worsening respiratory status requiring ICU transfer and intubation 6/23 Bronch for BAL per ID request. NO being weaned from 20 to 15. Overnight L PTX s/p chest tube. 6/25 Required transition to SIMV vent mode due to dyssynchrony. Sedation increased. Paralytic push given. CXR improved s/p diuresis. Required secondary L pigtail for persistent PTX.  6/26 Large bore chest tube placed, pigtail removed.  Interim History / Subjective:  No  overnight issues FiO2 was titrated down to 70%, PEEP remains at 5 Continued to have large air leak from chest tube Remain afebrile but tachycardic  Objective:   Blood pressure 119/69, pulse (!) 115, temperature 98.8 F (37.1 C), resp. rate 18, height 5' 7 (1.702 m), weight 44.6 kg, SpO2 96%.    Vent Mode: SIMV;PRVC;PSV FiO2 (%):  [70 %-80 %] 70 % Set Rate:  [18 bmp] 18 bmp Vt Set:  [410 mL] 410 mL PEEP:  [8 cmH20] 8 cmH20 Plateau Pressure:  [19 cmH20-27 cmH20] 27 cmH20   Intake/Output Summary (Last 24 hours) at 12/03/2023 9166 Last data filed at 12/03/2023 0701 Gross per 24 hour  Intake 2799.16 ml  Output 3675 ml  Net -875.84 ml   Filed Weights   11/30/23 0500 12/01/23 0415 12/02/23 0701  Weight: 51.9 kg 45.8 kg 44.6 kg   Physical Examination: General: Crtitically ill-appearing young male, orally intubated HEENT: [place]/AT, eyes anicteric.  ETT and cortrak in place Neuro: Close, opens with vocal stimuli, positive cough, gag, moving all 4 extremities Chest: Fine crackles no wheezes or rhonchi Heart: Tachycardia cardiac, regular rhythm, no murmurs or gallops Abdomen: Soft, nondistended, bowel sounds present   Labs and images reviewed  Patient Lines/Drains/Airways Status     Active Line/Drains/Airways     Name Placement date Placement time Site Days   Peripheral IV 11/19/23 20 G Anterior;Right Forearm 11/19/23  1338  Forearm  14   Peripheral IV 11/26/23 20 G Left Forearm 11/26/23  0105  Forearm  7   Chest Tube 1 Lateral;Left Pleural 32 Fr. 11/30/23  1130  Pleural  3   Urethral Catheter Natalie Snow,RN 16 Fr. 11/26/23  1415  --  7   Fecal Management System 35 mL 12/01/23  1359  -- 2   Airway 7.5 mm 11/26/23  0017  -- 7   Small Bore Feeding Tube 10 Fr. Left nare Marking at nare/corner of mouth 73 cm 11/27/23  1210  Left nare  6         Resolved Hospital problems  Hyperkalemia, hyponatremia resolved Hypophosphatemia Acute kidney injury, resolved  Assessment and Plan:   Acute respiratory failure with hypoxia and hypercapnia Probable severe PJP pneumonia, completed treatment Bilateral multifocal pneumonia with severe ARDS CMV viremia Left-sided pneumothorax status post chest tube placement persistent large airleak ?  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 Seizure disorder Prediabetes with hyperglycemia Anemia of critical illness Severe protein calorie malnutrition  Continue on protective ventilation VAP prevention bundle in place PAD protocol with Versed  and fentanyl  Propofol  and Precedex  was stopped yesterday FiO2 was titrated down to 70%, PEEP at 5 Continue to have large airleak, could be due to bronchopleural fistula Completed PJP therapy with Bactrim  Continue dapsone  for PJP prophylaxis Continue ganciclovir  for CMV viremia Continue azithromycin  for MAC prophylaxis Completed therapy with Zyvox  and cefepime  for multifocal pneumonia Continue ART Electrolytes are corrected Serum creatinine continued to improve ID is following Monitor H&H Continue tube feeds Continue valproic  acid   Best Practice (right click and Reselect all SmartList Selections daily)   Diet/type: TF DVT prophylaxis prophylactic heparin   Pressure ulcer(s): N/A GI prophylaxis: H2B Lines: arterial line Foley:  Yes, and it is still needed Code Status:  full code Last date of multidisciplinary goals of care discussion: 6/28, patient's mother was updated at bedside, options were given either to proceed with tracheostomy versus comfort care, she will speak with her family, will be able to make decision by 6/30   The patient is critically ill due to acute respiratory failure with hypoxia and hypercapnia/HIV AIDS.  Critical care was necessary to treat or prevent imminent or life-threatening deterioration.  Critical care was time spent personally by me on the following activities: development of treatment plan with patient and/or surrogate as  well as nursing, discussions with consultants, evaluation of patient's response to treatment, examination of patient, obtaining history from patient or surrogate, ordering and performing treatments and interventions, ordering and review of laboratory studies, ordering and review of radiographic studies, pulse oximetry, re-evaluation of patient's condition and participation in multidisciplinary rounds.   During this encounter critical care time was devoted to patient care services described in this note for 37 minutes.     Valinda Novas, MD [facility] See Amion for pager If no response to pager, please call [phone] until 7pm After 7pm, Please call E-link [phone]

## 2023-12-03 NOTE — Plan of Care (Signed)
  Problem: Clinical Measurements: Goal: Ability to maintain clinical measurements within normal limits will improve Outcome: Progressing Goal: Cardiovascular complication will be avoided Outcome: Progressing   Problem: Nutrition: Goal: Adequate nutrition will be maintained Outcome: Progressing   Problem: Elimination: Goal: Will not experience complications related to urinary retention Outcome: Progressing   Problem: Pain Managment: Goal: General experience of comfort will improve and/or be controlled Outcome: Progressing   Problem: Safety: Goal: Ability to remain free from injury will improve Outcome: Progressing   Problem: Skin Integrity: Goal: Risk for impaired skin integrity will decrease Outcome: Progressing   Problem: Fluid Volume: Goal: Ability to maintain a balanced intake and output will improve Outcome: Progressing   Problem: Nutritional: Goal: Maintenance of adequate nutrition will improve Outcome: Progressing   Problem: Skin Integrity: Goal: Risk for impaired skin integrity will decrease Outcome: Progressing   Problem: Tissue Perfusion: Goal: Adequacy of tissue perfusion will improve Outcome: Progressing

## 2023-12-03 NOTE — Progress Notes (Signed)
 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 [place], [place] [address] Phone: [phone]  Fax: [phone]'

## 2023-12-03 NOTE — Plan of Care (Signed)
  Problem: Clinical Measurements: Goal: Ability to maintain clinical measurements within normal limits will improve Outcome: Progressing Goal: Diagnostic test results will improve Outcome: Progressing Goal: Respiratory complications will improve Outcome: Progressing   Problem: Activity: Goal: Risk for activity intolerance will decrease Outcome: Progressing   Problem: Nutrition: Goal: Adequate nutrition will be maintained Outcome: Progressing

## 2023-12-03 NOTE — Progress Notes (Signed)
 eLink Physician-Brief Progress Note Patient Name: Cory Russell DOB: July 28, 1990 MRN: [id number]   Date of Service  12/03/2023  HPI/Events of Note  ABG was obtained this morning with severe hypercapnia, pH 7.27.  No adjustments made to the ventilator at that time.  RASS -2 but previously required intermittent paralytic push  eICU Interventions  Plan for a.m. ABG, hemodynamics remained stable and no indication for change at this time     Intervention Category Intermediate Interventions: Respiratory distress - evaluation and management  Cory Russell 12/03/2023, 10:14 PM

## 2023-12-04 DIAGNOSIS — J9601 Acute respiratory failure with hypoxia: Secondary | ICD-10-CM | POA: Diagnosis not present

## 2023-12-04 DIAGNOSIS — Z9911 Dependence on respirator [ventilator] status: Secondary | ICD-10-CM

## 2023-12-04 DIAGNOSIS — A419 Sepsis, unspecified organism: Secondary | ICD-10-CM | POA: Diagnosis not present

## 2023-12-04 DIAGNOSIS — J95851 Ventilator associated pneumonia: Secondary | ICD-10-CM | POA: Diagnosis not present

## 2023-12-04 DIAGNOSIS — B59 Pneumocystosis: Secondary | ICD-10-CM | POA: Diagnosis not present

## 2023-12-04 DIAGNOSIS — B250 Cytomegaloviral pneumonitis: Secondary | ICD-10-CM | POA: Diagnosis not present

## 2023-12-04 DIAGNOSIS — E871 Hypo-osmolality and hyponatremia: Secondary | ICD-10-CM | POA: Diagnosis not present

## 2023-12-04 DIAGNOSIS — J80 Acute respiratory distress syndrome: Secondary | ICD-10-CM | POA: Diagnosis not present

## 2023-12-04 DIAGNOSIS — B20 Human immunodeficiency virus [HIV] disease: Secondary | ICD-10-CM | POA: Diagnosis not present

## 2023-12-04 DIAGNOSIS — J939 Pneumothorax, unspecified: Secondary | ICD-10-CM | POA: Diagnosis not present

## 2023-12-04 DIAGNOSIS — E875 Hyperkalemia: Secondary | ICD-10-CM | POA: Diagnosis not present

## 2023-12-04 DIAGNOSIS — B9562 Methicillin resistant Staphylococcus aureus infection as the cause of diseases classified elsewhere: Secondary | ICD-10-CM

## 2023-12-04 LAB — GLUCOSE, CAPILLARY
Glucose-Capillary: 141 mg/dL — ABNORMAL HIGH (ref 70–99)
Glucose-Capillary: 166 mg/dL — ABNORMAL HIGH (ref 70–99)
Glucose-Capillary: 239 mg/dL — ABNORMAL HIGH (ref 70–99)
Glucose-Capillary: 126 mg/dL — ABNORMAL HIGH (ref 70–99)
Glucose-Capillary: 177 mg/dL — ABNORMAL HIGH (ref 70–99)
Glucose-Capillary: 222 mg/dL — ABNORMAL HIGH (ref 70–99)
Glucose-Capillary: 225 mg/dL — ABNORMAL HIGH (ref 70–99)
Glucose-Capillary: 200 mg/dL — ABNORMAL HIGH (ref 70–99)
Glucose-Capillary: 235 mg/dL — ABNORMAL HIGH (ref 70–99)
Glucose-Capillary: 159 mg/dL — ABNORMAL HIGH (ref 70–99)

## 2023-12-04 LAB — BASIC METABOLIC PANEL WITH GFR
Anion gap: 3 — ABNORMAL LOW (ref 5–15)
BUN: 44 mg/dL — ABNORMAL HIGH (ref 6–20)
CO2: 41 mmol/L — ABNORMAL HIGH (ref 22–32)
Calcium: 9.3 mg/dL (ref 8.9–10.3)
Chloride: 100 mmol/L (ref 98–111)
Creatinine, Ser: 0.71 mg/dL (ref 0.61–1.24)
GFR, Estimated: >60 mL/min (ref >60)
Glucose, Bld: 184 mg/dL — ABNORMAL HIGH (ref 70–99)
Potassium: 4.5 mmol/L (ref 3.5–5.1)
Sodium: 144 mmol/L (ref 135–145)

## 2023-12-04 LAB — POCT I-STAT 7, (LYTES, BLD GAS, ICA,H+H)
Acid-Base Excess: 13 mmol/L — ABNORMAL HIGH (ref 0.0–2.0)
Bicarbonate: 42 mmol/L — ABNORMAL HIGH (ref 20.0–28.0)
Calcium, Ion: 1.35 mmol/L (ref 1.15–1.40)
HCT: 26 % — ABNORMAL LOW (ref 39.0–52.0)
Hemoglobin: 8.8 g/dL — ABNORMAL LOW (ref 13.0–17.0)
O2 Saturation: 98 %
Potassium: 4.4 mmol/L (ref 3.5–5.1)
Sodium: 144 mmol/L (ref 135–145)
TCO2: 45 mmol/L — ABNORMAL HIGH (ref 22–32)
pCO2 arterial: 84.9 mmHg (ref 32–48)
pH, Arterial: 7.302 — ABNORMAL LOW (ref 7.35–7.45)
pO2, Arterial: 118 mmHg — ABNORMAL HIGH (ref 83–108)

## 2023-12-04 LAB — CBC
HCT: 25.6 % — ABNORMAL LOW (ref 39.0–52.0)
Hemoglobin: 7.9 g/dL — ABNORMAL LOW (ref 13.0–17.0)
MCH: 29.9 pg (ref 26.0–34.0)
MCHC: 30.9 g/dL (ref 30.0–36.0)
MCV: 97 fL (ref 80.0–100.0)
Platelets: 204 10*3/uL (ref 150–400)
RBC: 2.64 MIL/uL — ABNORMAL LOW (ref 4.22–5.81)
RDW: 16.4 % — ABNORMAL HIGH (ref 11.5–15.5)
WBC: 7.4 10*3/uL (ref 4.0–10.5)
nRBC: 0 % (ref 0.0–0.2)

## 2023-12-04 MED ORDER — ACETAZOLAMIDE 250 MG PO TABS
500.0000 mg | ORAL_TABLET | Freq: Three times a day (TID) | ORAL | Status: DC
  Administered 2023-12-04 (×2): 500 mg via ORAL
  Filled 2023-12-04 (×2): qty 2

## 2023-12-04 MED ORDER — LIDOCAINE-EPINEPHRINE 1 %-1:100000 IJ SOLN
20.0000 mL | Freq: Once | INTRAMUSCULAR | Status: DC
Start: 2023-12-05 — End: 2023-12-06

## 2023-12-04 MED ORDER — OXYCODONE HCL 5 MG PO TABS: 5.0000 mg | ORAL_TABLET | Freq: Four times a day (QID) | ORAL | Status: DC | PRN

## 2023-12-04 MED ORDER — FUROSEMIDE 10 MG/ML IJ SOLN
40.0000 mg | Freq: Two times a day (BID) | INTRAMUSCULAR | Status: DC
  Administered 2023-12-04: 40 mg via INTRAVENOUS
  Filled 2023-12-04: qty 4

## 2023-12-04 MED ORDER — FENTANYL CITRATE PF 50 MCG/ML IJ SOSY: 200.0000 ug | PREFILLED_SYRINGE | Freq: Once | INTRAMUSCULAR | Status: DC

## 2023-12-04 MED ORDER — ROCURONIUM BROMIDE 10 MG/ML (PF) SYRINGE: 100.0000 mg | PREFILLED_SYRINGE | Freq: Once | INTRAVENOUS | Status: DC

## 2023-12-04 MED ORDER — LACTATED RINGERS IV BOLUS
500.0000 mL | Freq: Once | INTRAVENOUS | Status: AC
  Administered 2023-12-04: 500 mL via INTRAVENOUS

## 2023-12-04 MED ORDER — ETOMIDATE 2 MG/ML IV SOLN
20.0000 mg | Freq: Once | INTRAVENOUS | Status: DC
Start: 2023-12-05 — End: 2023-12-06

## 2023-12-04 MED ORDER — METHYLPREDNISOLONE SODIUM SUCC 40 MG IJ SOLR
40.0000 mg | Freq: Two times a day (BID) | INTRAMUSCULAR | Status: DC
  Administered 2023-12-04 – 2023-12-08 (×8): 40 mg via INTRAVENOUS
  Filled 2023-12-04 (×8): qty 1

## 2023-12-04 MED ORDER — MIDAZOLAM HCL 2 MG/2ML IJ SOLN: 5.0000 mg | Freq: Once | INTRAMUSCULAR | Status: DC

## 2023-12-04 MED ORDER — CLONAZEPAM 0.5 MG PO TBDP
1.0000 mg | ORAL_TABLET | Freq: Two times a day (BID) | ORAL | Status: DC
  Administered 2023-12-04 – 2023-12-05 (×4): 1 mg
  Filled 2023-12-04 (×4): qty 2

## 2023-12-04 MED ORDER — VITAL AF 1.2 CAL PO LIQD
1000.0000 mL | ORAL | Status: DC
  Administered 2023-12-04 – 2023-12-07 (×3): 1000 mL

## 2023-12-04 MED ORDER — ETOMIDATE 2 MG/ML IV SOLN: 20.0000 mg | Freq: Once | INTRAVENOUS | Status: DC

## 2023-12-04 MED ORDER — LIDOCAINE-EPINEPHRINE 1 %-1:100000 IJ SOLN: 20.0000 mL | Freq: Once | INTRAMUSCULAR | Status: DC

## 2023-12-04 MED ORDER — VITAL AF 1.2 CAL PO LIQD: 1000.0000 mL | ORAL | Status: DC

## 2023-12-04 NOTE — TOC Progression Note (Signed)
 Transition of Care [facility]) - Progression Note    Patient Details  Name: Cory Russell MRN: [id number] Date of Birth: 1990-11-02  Transition of Care [facility]) CM/SW Contact  Justina Delcia Czar, RN Phone Number: [phone] 12/04/2023, 11:48 AM  Clinical Narrative:     Reviewed chart for dc readiness, will continue to follow for dc needs. MD may place LTAC referral, pt plan does not cover LTAC.    Expected Discharge Plan: Home/Self Care Barriers to Discharge: Continued Medical Work up  Expected Discharge Plan and Services   Discharge Planning Services: CM Consult Post Acute Care Choice: NA Living arrangements for the past 2 months: Single Family Home                   DME Agency: NA       HH Arranged: NA           Social Determinants of Health (SDOH) Interventions SDOH Screenings   Food Insecurity: No Food Insecurity (11/08/2023)  Housing: Low Risk  (11/08/2023)  Transportation Needs: No Transportation Needs (11/08/2023)  Utilities: Not At Risk (11/08/2023)  Depression (PHQ2-9): Low Risk  (04/12/2023)  Financial Resource Strain: Low Risk  (02/27/2023)   Received from [facility]  Tobacco Use: Low Risk  (11/08/2023)    Readmission Risk Interventions     No data to display

## 2023-12-04 NOTE — Progress Notes (Signed)
 Daily Progress Note   Patient Name: Cory Russell       Date: 12/04/2023 DOB: [birth year]  Age: 33 y.o. MRN#: [id number] Attending Physician: Harold Scholz, MD Primary Care Physician: Luiz Channel, MD Admit Date: 11/08/2023  Reason for Consultation/Follow-up: Establishing goals of care  Patient Profile/HPI: 33 y.o. male  with past medical history of HIV/AIDS, CD4<35 on admission, not on ARVs who presented to the ED with seizure activity on 11/08/2023 and is admitted with acute respiratory failure with ARDS and severe pneumocystis pneumonia.    Palliative Medicine has been consulted for goals of care discussions. Patient and family are faced with anticipatory care needs and complex medical decision making.   Subjective: Chart reviewed including labs, progress notes, imaging from this and previous encounters.  Discussed with bedside RN. Plan for trach tomorrow.  On eval- frail, diaphoretic .   Spoke with Cory Russell, patient's mom. She feels well supported by medical team. She is prepared for tracheostomy tomorrow. No other questions concerns or needs.  Review of Systems  Unable to perform ROS: Mental status change     Physical Exam Vitals and nursing note reviewed.  Constitutional:      Appearance: He is ill-appearing and diaphoretic.     Comments: thin   Cardiovascular:     Rate and Rhythm: Normal rate.   Neurological:     Comments: sedated            Vital Signs: BP 125/85   Pulse (!) 108   Temp (!) 97.5 F (36.4 C)   Resp 18   Ht 5' 7 (1.702 m)   Wt 43.6 kg   SpO2 92%   BMI 15.05 kg/m  SpO2: SpO2: 92 % O2 Device: O2 Device: Ventilator O2 Flow Rate: O2 Flow Rate (L/min): 30 L/min  Intake/output summary:  Intake/Output Summary (Last 24 hours) at 12/04/2023 1206 Last data  filed at 12/04/2023 1200 Gross per 24 hour  Intake 3156.55 ml  Output 4745 ml  Net -1588.45 ml   LBM: Last BM Date : 12/04/23 Baseline Weight: Weight: 49 kg Most recent weight: Weight: 43.6 kg       Palliative Assessment/Data: PPS: 10% (with artificial feeding in place)      Patient Active Problem List   Diagnosis Date Noted  . Fever 12/01/2023  . 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 IRIS (immune reconstitution inflammatory syndrome) (HCC) 11/28/2023  . Healthcare associated bacterial pneumonia 11/27/2023  . Pneumothorax 11/27/2023  . Hypoxia 11/23/2023  . 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 Condyloma acuminata 11/16/2023  . Acute hypoxic respiratory failure (HCC) 11/16/2023  . Hyponatremia 11/16/2023  . Hyperkalemia 11/16/2023  . 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 AIDS (acquired immune deficiency syndrome) (HCC) 11/10/2023  . Seizure disorder (HCC) 11/08/2023  . Severe sepsis due to PJP pneumonia 11/08/2023  . Protein-calorie malnutrition, severe 11/08/2023  . Seizure (HCC) 02/28/2023  . Nausea 01/05/2023  . HIV (human immunodeficiency virus infection) (HCC)   . Hypertension   . Rectal mass 09/30/2019  . History of syphilis 09/30/2019  . Hemorrhoids 01/23/2019  . Routine screening for STI (sexually transmitted infection) 11/27/2018  . Healthcare maintenance 06/25/2018  . Depression 01/26/2015  . Encounter for long-term (current) use of medications 08/28/2014  . Poor appetite 08/28/2014  . Human immunodeficiency virus (HIV) disease (HCC) 12/20/2013    Palliative Care Assessment & Plan    Assessment/Recommendations/Plan  Respiratory failure in setting of severe pnuemonia, HIV- plan for full scope, full code To have tracheostomy placed tomorrow PMT will continue to follow   Code Status:   Code Status: Full Code   Prognosis:  Unable to determine  Discharge Planning: To Be  Determined  Care plan was discussed with bedside RN, patient's mother  Thank you for allowing the Palliative Medicine Team to assist in the care of this patient.  Total time:  55 mins Prolonged billing:  Time includes:   Preparing to see the patient (e.g., review of tests) Obtaining and/or reviewing separately obtained history Performing a medically necessary appropriate examination and/or evaluation Counseling and educating the patient/family/caregiver Ordering medications, tests, or procedures Referring and communicating with other health care professionals (when not reported separately) Documenting clinical information in the electronic or other health record Independently interpreting results (not reported separately) and communicating results to the patient/family/caregiver Care coordination (not reported separately) Clinical documentation  Cassondra Stain, AGNP-C Palliative Medicine   Please contact Palliative Medicine Team phone at [phone] for questions and concerns.

## 2023-12-04 NOTE — Progress Notes (Signed)
   12/04/23 1044  Spiritual Encounters  Type of Visit Follow up  Care provided to: Family  Reason for visit Routine spiritual support  OnCall Visit No  Spiritual Framework  Presenting Themes Values and beliefs  Values/beliefs Trusting God  Community/Connection Family  Family Stress Factors Health changes  Interventions  Spiritual Care Interventions Made Prayer;Established relationship of care and support;Compassionate presence  Intervention Outcomes  Outcomes Awareness of support;Awareness of health;Reduced anxiety;Awareness around self/spiritual resourses   Chaplain stopped by to follow up with the Pt's mother regarding the status of her son. Chaplain offered a word of prayer per mother request, providing continued  emotional and spiritual support during this time of uncertainty.

## 2023-12-04 NOTE — Plan of Care (Signed)
   Problem: Clinical Measurements: Goal: Ability to maintain clinical measurements within normal limits will improve Outcome: Progressing Goal: Diagnostic test results will improve Outcome: Progressing Goal: Respiratory complications will improve Outcome: Progressing Goal: Cardiovascular complication will be avoided Outcome: Progressing

## 2023-12-04 NOTE — Progress Notes (Signed)
 Nutrition Follow-up  DOCUMENTATION CODES:   Severe malnutrition in context of chronic illness, Underweight  INTERVENTION:   Of note, TF does not appear to be held daily x 4 hours for Biktarvy  administration as per med admin. Discussed with Pharmacist on Friday. RD plan to re-calculate TF over 24 hours for now  Tube Feeding via Cortrak: change formula from Nepro to Vital AF to see if this improves diarrhea, potassium has improved Change to Vital AF 1.2 at 65 ml/hr D/C Pro-source TF goal provides 117 g of protein, 1872 kcals and 1264 mL of free water  Current free water flush of 100 mL q 4 hours provides additional 600 mL of free water per day  Continue MVI with Minerals   NUTRITION DIAGNOSIS:   Severe Malnutrition related to chronic illness (HIV) as evidenced by severe muscle depletion, severe fat depletion.  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  6/04 Admitted 6/22 Intubated, TF initiated at 10, goal 40 6/23 Gastric Cortrak placed, Chest tube overnight for tension pneumothorax 6/25 Secondary Pigtail CT placed 6/26 Large bore chest tube placed, Pigtail removed  Pt remains on vent support, noted plan for trach  Almost 1L of liquid stool via FMS in 24 hours. Given, persistent hyperkalemia appears to have resolved, plan to change formula to see if this helps  Potassium over the last several days has been wdl  Nepro TF infusing at 55 ml/hr via Cortrak. Of note, rate was adjusted on Friday to account for TF being held for Biktarvy   administration x 4  hours. As noted on Friday, this continues to not be done.   Receiving free water flush of 100 mL q 4 hours  Weight down to 43.6 kg, lowest wt since admission. Underweight with BMI 15. Highest wt this admission 55 kg but weights have been fluctuating up and down  UOP around 3-4 L per day; net negative 7-8 L  No skin breakdown per RN skin assessment  Labs:  Sodium 144 (wdl) Potassium 4.4 (wdl) BUN 44 (H) Creatinine wdl  Meds:  Colace, Miralax  Lasix  BID SS novolog  MVI with Minerals   Diet Order:   Diet Order             Diet NPO time specified  Diet effective 0500           Diet NPO time specified  Diet effective midnight                   EDUCATION NEEDS:   No education needs have been identified at this time  Skin:  Skin Assessment: Reviewed RN Assessment  Last BM:  6/27 diarrhea, FMS inserted  Height:   Ht Readings from Last 1 Encounters:  11/30/23 5' 7 (1.702 m)    Weight:   Wt Readings from Last 1 Encounters:  12/04/23 43.6 kg     BMI:  Body mass index is 15.05 kg/m.  Estimated Nutritional Needs:   Kcal:  1750-1950 kcals  Protein:  95-115 g  Fluid:  >1.8 L/day    Betsey Finger  MS, RDN, LDN, CNSC Registered Dietitian 3 Clinical Nutrition RD Inpatient Contact Info in Amion

## 2023-12-04 NOTE — Progress Notes (Signed)
 NAME:  Cory Russell, MRN:  [id number], DOB:  November 29, 1990, LOS: 26 ADMISSION DATE:  11/08/2023, CONSULTATION DATE: 11/19/2023 REFERRING MD:  Jonel - TRH, CHIEF COMPLAINT:  Respiratory failure   History of Present Illness:  Cory Russell is a 33 y.o. man with past medical history of HIV/AIDS, CD4<35 on admission, not on ARVs. Presents with seizure like activity 11 days ago and found to have sepsis from pneumonia.  He notes symptoms started about 2-3 weeks ago with shortness of breath and dry cough. He had two separate rounds of antibiotics visiting other urgent care and ED visits. He has tried albuterol  nebulizer treatments from friends and initially found them helpful. CT Chest shows bilateral ground glass opacities concerning for pneumoncystis pneumonia. Since being here he has been Started on empiric antibiotics with bactrim  and steroids. Also resumed ARVs during this admission. Has been persistently hypoxemic on 10LNC. Pulmonary consulted to assist with evaluation and management and for bronchoscopy.   He feels persistently short of breath, worse with exertion. He feels like chest PT is helping him. Feeling more anxious. Also having difficulty taking pills.   Pertinent Medical History:  HIV, Seizure disorder  Significant Hospital Events: Including procedures, antibiotic start and stop dates in addition to other pertinent events   6/04 Admit 6/15 Pulmonary consult 6/18 Transient desats with worsening chest x-ray.  Prednisone  changed to IV Solu-Medrol  6/22 Worsening respiratory status requiring ICU transfer and intubation 6/23 Bronch for BAL per ID request. NO being weaned from 20 to 15. Overnight L PTX s/p chest tube. 6/25 Required transition to SIMV vent mode due to dyssynchrony. Sedation increased. Paralytic push given. CXR improved s/p diuresis. Required secondary L pigtail for persistent PTX.  6/26 Large bore chest tube placed, pigtail removed. 6/30 discussed plan for trach. Weaning  sedation   Interim History / Subjective:   NAEO Mom at bedside   Objective:   Blood pressure 138/85, pulse (!) 115, temperature 97.7 F (36.5 C), resp. rate (!) 23, height 5' 7 (1.702 m), weight 43.6 kg, SpO2 91%.    Vent Mode: PRVC FiO2 (%):  [50 %-70 %] 50 % Set Rate:  [18 bmp] 18 bmp Vt Set:  [410 mL] 410 mL PEEP:  [5 cmH20-8 cmH20] 5 cmH20 Pressure Support:  [10 cmH20] 10 cmH20 Plateau Pressure:  [18 cmH20-32 cmH20] 18 cmH20   Intake/Output Summary (Last 24 hours) at 12/04/2023 1056 Last data filed at 12/04/2023 0900 Gross per 24 hour  Intake 2893.52 ml  Output 4130 ml  Net -1236.48 ml   Filed Weights   12/01/23 0415 12/02/23 0701 12/04/23 0500  Weight: 45.8 kg 44.6 kg 43.6 kg   Physical Examination:  General: Critically and chronically ill appearing adult M intubated sedated NAD  HEENT: EET secure anicteric sclera  Neuro: Awake, moving spontaneously, does not follow commands PERRL  Pulm: Mechanically ventilated, coarse bilaterally. L large bore chest tube with intermittent air leak  CV: tachycardic, regular  Abdomen: thin soft  GU: foley FDX:izrm muscle mass    Assessment and Plan:    Acute resp failure w hypoxia and hypercarbia PJP PNA ARDS CMV viremia L ptx  HIV/ AIDS IRIS  Sz disorder Hyperglycemia Anemia - critical illness Severe protein calorie malnutrition  P -plan for trach 7/1  -add oxy and clonazepam per tube 6/30 -post trach, wean IV sedation and cont vent weaning efforts -biktarvy , darunavir -cobibistat, canciclovir, azithro  -lasix   -EN, FWF   -SSI  -steroids    Best Practice (right click and Reselect all  SmartList Selections daily)   Diet/type: TF DVT prophylaxis prophylactic heparin   Pressure ulcer(s): N/A GI prophylaxis: H2B Lines: arterial line Foley:  Yes, and it is still needed Code Status:  full code Last date of multidisciplinary goals of care discussion: 6/30 patient's mother was updated at bedside, options were  given either to proceed with tracheostomy versus comfort care, she will speak with her family, will be able to make decision in next few days   CRITICAL CARE Performed by: Ronnald FORBES Gave   Total critical care time: 36 minutes  Critical care time was exclusive of separately billable procedures and treating other patients. Critical care was necessary to treat or prevent imminent or life-threatening deterioration.  Critical care was time spent personally by me on the following activities: development of treatment plan with patient and/or surrogate as well as nursing, discussions with consultants, evaluation of patient's response to treatment, examination of patient, obtaining history from patient or surrogate, ordering and performing treatments and interventions, ordering and review of laboratory studies, ordering and review of radiographic studies, pulse oximetry and re-evaluation of patient's condition.  Ronnald Gave MSN, AGACNP-BC [facility] Pul[facility]/Critical Care Medicine Amion for pager  12/04/2023, 10:56 AM

## 2023-12-04 NOTE — Progress Notes (Signed)
 Cumulative HIV Genotype Data   Genotype Dates: 2015, 2016, 2017, 2020, 2021, 2022   RT Mutations D67N, K70R, M184V, K219E, V179E, Y181C, V21I, K49R, V90I, D123E, I135T, S162D, Q174H, I178M, I202V, K238KR, P243T, V245E, T286A, I293V, D324N, I329L, G335D  PI Mutations E21I, E35D, R41K, K45R, Q61R, I62V, L63P, I64L, A71V, I72E, V77I, L90I, Q92H, I93L  Integrase Mutations  G163A, T66I    Interpretation of Genotype Data per [employer] HIV Drug Resistance Database:   Nucleoside RTIs  Abacavir  - High-level resistance Zidovudine - Intermediate resistance Emtricitabine  - High-level resistance Lamivudine - High-level resistance Tenofovir  - Low-level resistance    Non-Nucleoside RTIs  Doravirine  - Potential low-level resistance Efavirenz - Intermediate resistance Etravirine - Intermediate resistance Nevirapine - High-level resistance Rilpivirine  - Intermediate resistance    Protease Inhibitors  Atazanavir - Susceptible Darunavir  - Susceptible Lopinavir - Susceptible    Integrase Inhibitors  Bictegravir - Susceptible Cabotegravir - Potential low-level resistance Dolutegravir  - Susceptible Elvitegravir - High-level resistance Raltegravir - Low-level resistance    Damien Quiet, PharmD, BCPS, BCIDP Infectious Diseases Clinical Pharmacist Phone: [phone] 12/04/2023 9:36 AM

## 2023-12-04 NOTE — Progress Notes (Signed)
 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  Plans for tracheostomy tomorrow.  Appears to be tolerating medication with no adverse side effects.  Discussed with mom at bedside regarding plan of care to continue current dose of Biktarvy  and dapsone  for AIDS and ganciclovir  for CMV pneumonitis.  Palliative medicine following with ongoing goals of care discussions.  Remaining medical and supportive care per PCCM.  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 Ongoing goals of care per palliative medicine. Remaining medical and supportive care per PCCM.   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)   Pneumonia due to Pneumocystis jirovecii (HCC)   Hypoxia   Healthcare associated bacterial pneumonia   Pneumothorax   IRIS (immune reconstitution inflammatory syndrome) (HCC)   Cytomegalovirus (CMV) viremia (HCC)   Fever    acetaZOLAMIDE  500 mg Oral TID   arformoterol   15 mcg Nebulization BID   azithromycin   1,200 mg Per Tube Weekly   bictegravir-emtricitabine -tenofovir  AF  1 tablet Oral Daily   Chlorhexidine  Gluconate Cloth  6 each Topical Daily    clonazepam  1 mg Per Tube BID   dapsone   100 mg Per Tube Daily   darunavir -cobicistat   1 tablet Oral Q breakfast   docusate  100 mg Per Tube BID   [START ON 12/05/2023] etomidate   20 mg Intravenous Once   famotidine   20 mg Per Tube BID   [START ON 12/05/2023] fentaNYL  (SUBLIMAZE ) injection  200 mcg Intravenous Once   free water  100 mL Per Tube Q4H   furosemide   40 mg Intravenous BID   heparin  injection (subcutaneous)  5,000 Units Subcutaneous Q8H   insulin  aspart  0-20 Units Subcutaneous Q4H   lidocaine   1 patch Transdermal Q24H   [START ON 12/05/2023] lidocaine -EPINEPHrine   20 mL Intradermal Once   methylPREDNISolone  (SOLU-MEDROL ) injection  40 mg Intravenous Q12H   metoprolol  tartrate  12.5 mg Per Tube Q8H   [START ON 12/05/2023] midazolam   5 mg Intravenous Once   multivitamin with minerals  1 tablet Per Tube Daily   mouth rinse  15 mL Mouth Rinse Q2H   polyethylene glycol  17 g Per Tube Daily   revefenacin   175 mcg Nebulization Daily   [START ON 12/05/2023] rocuronium   100 mg Intravenous Once   sodium chloride  flush  10 mL Intrapleural Q8H   valproic  acid  500 mg Per Tube BID    SUBJECTIVE:  Febrile overnight with a temperature of 101.5 F with no acute events.  Remains sedated and intubated for respiratory failure.  Mother at bedside.  No Known Allergies   Review of Systems: Review of Systems  Unable to perform ROS: Intubated  OBJECTIVE: Vitals:   12/04/23 1115 12/04/23 1130 12/04/23 1145 12/04/23 1200  BP:  124/89  125/85  Pulse: (!) 115 (!) 109 (!) 109 (!) 108  Resp: (!) 27 (!) 22 19 18   Temp: (!) 97.5 F (36.4 C) (!) 97.5 F (36.4 C) 97.9 F (36.6 C) (!) 97.5 F (36.4 C)  TempSrc:      SpO2: 94% 94% 94% 92%  Weight:      Height:       Body mass index is 15.05 kg/m.  Physical Exam Constitutional:      General: He is not in acute distress.    Appearance: He is well-developed. He is ill-appearing.     Interventions: He is sedated, intubated and restrained.    Eyes:     Conjunctiva/sclera: Conjunctivae normal.    Cardiovascular:     Rate and Rhythm: Normal rate and regular rhythm.     Heart sounds: Normal heart sounds. No murmur heard.    No friction rub. No gallop.  Pulmonary:     Effort: Pulmonary effort is normal. No respiratory distress. He is intubated.     Breath sounds: Normal breath sounds. No wheezing or rales.  Chest:     Chest wall: No tenderness.  Abdominal:     General: Bowel sounds are normal.     Palpations: Abdomen is soft.     Tenderness: There is no abdominal tenderness.   Musculoskeletal:     Cervical back: Neck supple.  Lymphadenopathy:     Cervical: No cervical adenopathy.   Skin:    General: Skin is warm and dry.     Findings: No rash.   Neurological:     Mental Status: He is oriented to person, place, and time.   Psychiatric:        Behavior: Behavior normal.        Thought Content: Thought content normal.        Judgment: Judgment normal.     Lab Results Lab Results  Component Value Date   WBC 7.4 12/04/2023   HGB 8.8 (L) 12/04/2023   HCT 26.0 (L) 12/04/2023   MCV 97.0 12/04/2023   PLT 204 12/04/2023    Lab Results  Component Value Date   CREATININE 0.71 12/04/2023   BUN 44 (H) 12/04/2023   NA 144 12/04/2023   K 4.4 12/04/2023   CL 100 12/04/2023   CO2 41 (H) 12/04/2023    Lab Results  Component Value Date   ALT 15 11/18/2023   AST 25 11/18/2023   ALKPHOS 57 11/18/2023   BILITOT 0.4 11/18/2023     Microbiology: Recent Results (from the past 240 hours)  Blastomyces Antigen     Status: None   Collection Time: 11/25/23  6:05 AM   Specimen: Blood  Result Value Ref Range Status   Blastomyces Antigen None Detected None Detected ng/mL Final    Comment: (NOTE) Reference Interval: None Detected Reportable Range: 0.31 ng/mL - 20.00 ng/mL Results above 20.00 ng/mL are reported as 'Positive, Above the Limit of Quantification' This test was developed and its performance  characteristics determined by [employer]. It has not been cleared or approved by the FDA; however, FDA clearance or approval is not currently required for clinical use. The results are not intended to be used as the sole means for clinical diagnosis or patient decisions.    Interpretation Negative  Final   Specimen Type SERUM  Final    Comment: (NOTE) Performed At: [facility] [phone]  [address] [place], [place] [id number] [place] Pac MD Ey:[id number]   MRSA Next Gen by PCR, Nasal     Status: Abnormal   Collection Time: 11/25/23 11:35 PM   Specimen: Nasal Mucosa; Nasal Swab  Result Value Ref Range Status   MRSA by PCR Next Gen DETECTED (A) NOT DETECTED Final    Comment: RESULT CALLED TO, READ BACK BY AND VERIFIED WITH: SHAFFNER,P RN 11/26/2023 AT 0218 SKEEN,P (NOTE) The GeneXpert MRSA Assay (FDA approved for NASAL specimens only), is one component of a comprehensive MRSA colonization surveillance program. It is not intended to diagnose MRSA infection nor to guide or monitor treatment for MRSA infections. Test performance is not FDA approved in patients less than 58 years old. Performed at [facility], [address] [place], [place] [address]   Culture, Respiratory w Gram Stain     Status: None   Collection Time: 11/27/23  9:27 AM   Specimen: Bronchoalveolar Lavage; Respiratory  Result Value Ref Range Status   Specimen Description BRONCHIAL ALVEOLAR LAVAGE  Final   Special Requests NONE  Final   Gram Stain   Final    NO WBC SEEN NO ORGANISMS SEEN Performed at [facility], [address] [place], [place] [address]    Culture RARE CANDIDA ALBICANS  Final   Report Status 11/30/2023 FINAL  Final  Acid Fast Smear (AFB)     Status: None   Collection Time: 11/27/23  9:27 AM   Specimen: Bronchoalveolar Lavage; Respiratory  Result Value Ref Range Status   AFB Specimen Processing Concentration  Final   Acid Fast Smear Negative  Final     Comment: (NOTE) Performed At: [facility] [address] [place], [place] [id number] Jennette Shorter MD Ey:[id number]    Source (AFB) BRONCHIAL ALVEOLAR LAVAGE  Final    Comment: Performed at [facility], [address] [place], [place] [address]  Fungus Culture With Stain     Status: None (Preliminary result)   Collection Time: 11/27/23  9:27 AM   Specimen: Bronchoalveolar Lavage; Respiratory  Result Value Ref Range Status   Fungus Stain Final report  Final    Comment: (NOTE) Performed At: [facility] [address] [place], [place] [id number] Jennette Shorter MD Ey:[id number]    Fungus (Mycology) Culture PENDING  Incomplete   Fungal Source BRONCHIAL ALVEOLAR LAVAGE  Final    Comment: Performed at [facility], [address], [place], [place] [address]  Virus culture     Status: Abnormal   Collection Time: 11/27/23  9:27 AM   Specimen: Bronchoalveolar Lavage  Result Value Ref Range Status   Viral Culture Comment (A)  Final    Comment: (NOTE) Positive Cytomegalovirus detected by immunofluorescent monoclonal antibody staining in shell vial culture. Performed At: [facility] [address] [place], [place] [id number] Jennette Shorter MD Ey:[id number]    Source of Sample BRONCHIAL ALVEOLAR LAVAGE  Final    Comment: Performed at [facility], [address], [place], [place] [address]  Fungus Culture Result     Status: None   Collection Time: 11/27/23  9:27 AM  Result Value Ref Range Status   Result 1 Comment  Final    Comment: (NOTE) KOH/Calcofluor preparation:  no fungus observed. Performed At: [facility] [address] [place], [place] [id number] Jennette Shorter MD Ey:[id number]   Culture, blood (Routine X 2) w Reflex to ID Panel     Status: None (Preliminary result)   Collection  Time: 12/01/23 12:43 PM   Specimen: BLOOD  Result Value Ref Range Status   Specimen Description BLOOD SITE NOT SPECIFIED  Final   Special  Requests   Final    BOTTLES DRAWN AEROBIC AND ANAEROBIC Blood Culture adequate volume   Culture   Final    NO GROWTH 3 DAYS Performed at [facility], [address] [place], [place] [address]    Report Status PENDING  Incomplete  Culture, blood (Routine X 2) w Reflex to ID Panel     Status: None (Preliminary result)   Collection Time: 12/01/23 12:44 PM   Specimen: BLOOD  Result Value Ref Range Status   Specimen Description BLOOD SITE NOT SPECIFIED  Final   Special Requests   Final    BOTTLES DRAWN AEROBIC AND ANAEROBIC Blood Culture adequate volume   Culture   Final    NO GROWTH 3 DAYS Performed at [facility], [address], [place], [place] [address]    Report Status PENDING  Incomplete    I have personally spent 28 minutes involved in face-to-face and non-face-to-face activities for this patient on the day of the visit. Professional time spent includes the following activities: preparing to see the patient (review of tests), performing a medically appropriate examination, ordering medications, communicating with other health care professionals, documenting clinical information in the EMR, communicating results and counseling family regarding medication and plan of care, and care coordination.    Greg Jaidon Ellery, NP [facility]  12/04/2023  2:47 PM

## 2023-12-04 NOTE — Progress Notes (Signed)
 ABG results given to Dr. Paliwal. No new orders received at this time. RT will continue to monitor and be available as needed.

## 2023-12-04 NOTE — Progress Notes (Signed)
 eLink Physician-Brief Progress Note Patient Name: Cory Russell DOB: 1990-09-28 MRN: [id number]   Date of Service  12/04/2023  HPI/Events of Note  Has been having progressively worsening tachycardia throughout the day.  Initially febrile up to 38.1, now improving.  Has scheduled metoprolol  the negative fluid balance for the day  eICU Interventions  Will administer 500 cc of LR Overbreathing the vent, could get gas to reassess if fluids are ineffective     Intervention Category Intermediate Interventions: Arrhythmia - evaluation and management  Chrystian Ressler 12/04/2023, 8:17 PM

## 2023-12-05 ENCOUNTER — Inpatient Hospital Stay (HOSPITAL_COMMUNITY)

## 2023-12-05 DIAGNOSIS — J80 Acute respiratory distress syndrome: Secondary | ICD-10-CM | POA: Diagnosis not present

## 2023-12-05 DIAGNOSIS — A419 Sepsis, unspecified organism: Secondary | ICD-10-CM | POA: Diagnosis not present

## 2023-12-05 DIAGNOSIS — J9601 Acute respiratory failure with hypoxia: Secondary | ICD-10-CM | POA: Diagnosis not present

## 2023-12-05 DIAGNOSIS — E875 Hyperkalemia: Secondary | ICD-10-CM | POA: Diagnosis not present

## 2023-12-05 DIAGNOSIS — Z7189 Other specified counseling: Secondary | ICD-10-CM | POA: Diagnosis not present

## 2023-12-05 DIAGNOSIS — E871 Hypo-osmolality and hyponatremia: Secondary | ICD-10-CM | POA: Diagnosis not present

## 2023-12-05 DIAGNOSIS — B20 Human immunodeficiency virus [HIV] disease: Secondary | ICD-10-CM | POA: Diagnosis not present

## 2023-12-05 LAB — BASIC METABOLIC PANEL WITH GFR
Anion gap: 7 (ref 5–15)
Anion gap: 7 (ref 5–15)
BUN: 53 mg/dL — ABNORMAL HIGH (ref 6–20)
BUN: 63 mg/dL — ABNORMAL HIGH (ref 6–20)
CO2: 37 mmol/L — ABNORMAL HIGH (ref 22–32)
CO2: 37 mmol/L — ABNORMAL HIGH (ref 22–32)
Calcium: 9.1 mg/dL (ref 8.9–10.3)
Calcium: 9 mg/dL (ref 8.9–10.3)
Chloride: 104 mmol/L (ref 98–111)
Chloride: 102 mmol/L (ref 98–111)
Creatinine, Ser: 0.68 mg/dL (ref 0.61–1.24)
Creatinine, Ser: 0.95 mg/dL (ref 0.61–1.24)
GFR, Estimated: >60 mL/min (ref >60)
GFR, Estimated: >60 mL/min (ref >60)
Glucose, Bld: 111 mg/dL — ABNORMAL HIGH (ref 70–99)
Glucose, Bld: 223 mg/dL — ABNORMAL HIGH (ref 70–99)
Potassium: 5.8 mmol/L — ABNORMAL HIGH (ref 3.5–5.1)
Potassium: 5.8 mmol/L — ABNORMAL HIGH (ref 3.5–5.1)
Sodium: 148 mmol/L — ABNORMAL HIGH (ref 135–145)
Sodium: 146 mmol/L — ABNORMAL HIGH (ref 135–145)

## 2023-12-05 LAB — CBC
HCT: 26.9 % — ABNORMAL LOW (ref 39.0–52.0)
Hemoglobin: 8.1 g/dL — ABNORMAL LOW (ref 13.0–17.0)
MCH: 29.3 pg (ref 26.0–34.0)
MCHC: 30.1 g/dL (ref 30.0–36.0)
MCV: 97.5 fL (ref 80.0–100.0)
Platelets: 237 10*3/uL (ref 150–400)
RBC: 2.76 MIL/uL — ABNORMAL LOW (ref 4.22–5.81)
RDW: 16.6 % — ABNORMAL HIGH (ref 11.5–15.5)
WBC: 9.9 10*3/uL (ref 4.0–10.5)
nRBC: 0 % (ref 0.0–0.2)

## 2023-12-05 LAB — VIRUS CULTURE

## 2023-12-05 LAB — GLUCOSE, CAPILLARY
Glucose-Capillary: 132 mg/dL — ABNORMAL HIGH (ref 70–99)
Glucose-Capillary: 104 mg/dL — ABNORMAL HIGH (ref 70–99)
Glucose-Capillary: 159 mg/dL — ABNORMAL HIGH (ref 70–99)
Glucose-Capillary: 223 mg/dL — ABNORMAL HIGH (ref 70–99)
Glucose-Capillary: 153 mg/dL — ABNORMAL HIGH (ref 70–99)

## 2023-12-05 LAB — MAGNESIUM: Magnesium: 2.6 mg/dL — ABNORMAL HIGH (ref 1.7–2.4)

## 2023-12-05 MED ORDER — INSULIN ASPART 100 UNIT/ML IV SOLN
10.0000 [IU] | Freq: Once | INTRAVENOUS | Status: AC
  Administered 2023-12-05: 10 [IU] via INTRAVENOUS

## 2023-12-05 MED ORDER — FREE WATER
200.0000 mL | Status: DC
  Administered 2023-12-05 – 2023-12-10 (×30): 200 mL

## 2023-12-05 MED ORDER — OXYCODONE HCL 5 MG PO TABS
10.0000 mg | ORAL_TABLET | Freq: Four times a day (QID) | ORAL | Status: DC
  Administered 2023-12-05 – 2023-12-08 (×13): 10 mg via ORAL
  Filled 2023-12-05 (×13): qty 2

## 2023-12-05 MED ORDER — DEXTROSE 50 % IV SOLN
50.0000 mL | Freq: Once | INTRAVENOUS | Status: AC
  Administered 2023-12-05: 50 mL via INTRAVENOUS
  Filled 2023-12-05: qty 50

## 2023-12-05 MED ORDER — METOPROLOL TARTRATE 25 MG PO TABS
25.0000 mg | ORAL_TABLET | Freq: Three times a day (TID) | ORAL | Status: DC
  Administered 2023-12-05 – 2023-12-14 (×20): 25 mg
  Filled 2023-12-05 (×23): qty 1

## 2023-12-05 MED ORDER — SODIUM ZIRCONIUM CYCLOSILICATE 10 G PO PACK
10.0000 g | PACK | Freq: Once | ORAL | Status: AC
  Administered 2023-12-05: 10 g
  Filled 2023-12-05: qty 1

## 2023-12-05 MED ORDER — ROCURONIUM BROMIDE 10 MG/ML (PF) SYRINGE
PREFILLED_SYRINGE | INTRAVENOUS | Status: AC
  Administered 2023-12-05: 100 mg via INTRAVENOUS
  Filled 2023-12-05: qty 10

## 2023-12-05 MED ORDER — FUROSEMIDE 10 MG/ML IJ SOLN
40.0000 mg | Freq: Two times a day (BID) | INTRAMUSCULAR | Status: DC
  Administered 2023-12-05 – 2023-12-10 (×11): 40 mg via INTRAVENOUS
  Filled 2023-12-05 (×11): qty 4

## 2023-12-05 MED ORDER — OXYCODONE HCL 5 MG PO TABS
5.0000 mg | ORAL_TABLET | Freq: Four times a day (QID) | ORAL | Status: DC | PRN
  Administered 2023-12-14: 10 mg
  Filled 2023-12-05 (×2): qty 2

## 2023-12-05 NOTE — Progress Notes (Signed)
 Daily Progress Note   Patient Name: Cory Russell       Date: 12/05/2023 DOB: 1990/07/10  Age: 33 y.o. MRN#: [id number] Attending Physician: Harold Scholz, MD Primary Care Physician: Luiz Channel, MD Admit Date: 11/08/2023  Reason for Consultation/Follow-up: Establishing goals of care  Patient Profile/HPI: 33 y.o. male  with past medical history of HIV/AIDS, CD4<35 on admission, not on ARVs who presented to the ED with seizure activity on 11/08/2023 and is admitted with acute respiratory failure with ARDS and severe pneumocystis pneumonia.    Palliative Medicine has been consulted for goals of care discussions. Patient and family are faced with anticipatory care needs and complex medical decision making.   Subjective: Chart reviewed including labs, progress notes, imaging from this and previous encounters.  PCCM planning trach today.  Mother at bedside. Emotional support provided. No needs. Spiritual support offered- she declined- she is a Education officer, environmental and has much support.  She is struggling with how patient will accept trach when he is able to wake up. We discussed the need for ongoing discussions with patient when he is hopefully able to wake and participate in discussion regarding his idea of quality of life and interventions he would/would not want.  She notes that he has previously stated he wanted all interventions possible.   Review of Systems  Unable to perform ROS: Mental status change     Physical Exam Vitals and nursing note reviewed.  Constitutional:      Appearance: He is ill-appearing and diaphoretic.     Comments: thin   Cardiovascular:     Rate and Rhythm: Normal rate.   Neurological:     Comments: sedated            Vital Signs: BP 124/63   Pulse (!) 113   Temp 99.3  F (37.4 C)   Resp 20   Ht 5' 7 (1.702 m)   Wt 42.1 kg   SpO2 93%   BMI 14.54 kg/m  SpO2: SpO2: 93 % O2 Device: O2 Device: Ventilator O2 Flow Rate: O2 Flow Rate (L/min): 30 L/min  Intake/output summary:  Intake/Output Summary (Last 24 hours) at 12/05/2023 1057 Last data filed at 12/05/2023 0800 Gross per 24 hour  Intake 3292.65 ml  Output 3405 ml  Net -112.35 ml   LBM: Last BM Date : 12/04/23  Baseline Weight: Weight: 49 kg Most recent weight: Weight: 42.1 kg       Palliative Assessment/Data: PPS: 10% (with artificial feeding in place)      Patient Active Problem List   Diagnosis Date Noted   Fever 12/01/2023   Cytomegalovirus (CMV) viremia (HCC) 11/29/2023   IRIS (immune reconstitution inflammatory syndrome) (HCC) 11/28/2023   Healthcare associated bacterial pneumonia 11/27/2023   Pneumothorax 11/27/2023   Hypoxia 11/23/2023   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   Condyloma acuminata 11/16/2023   Acute hypoxic respiratory failure (HCC) 11/16/2023   Hyponatremia 11/16/2023   Hyperkalemia 11/16/2023   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) 11/16/2023   AIDS (acquired immune deficiency syndrome) (HCC) 11/10/2023   Seizure disorder (HCC) 11/08/2023   Severe sepsis due to PJP pneumonia 11/08/2023   Protein-calorie malnutrition, severe 11/08/2023   Seizure (HCC) 02/28/2023   Nausea 01/05/2023   HIV (human immunodeficiency virus infection) (HCC)    Hypertension    Rectal mass 09/30/2019   History of syphilis 09/30/2019   Hemorrhoids 01/23/2019   Routine screening for STI (sexually transmitted infection) 11/27/2018   Healthcare maintenance 06/25/2018   Depression 01/26/2015   Encounter for long-term (current) use of medications 08/28/2014   Poor appetite 08/28/2014   Human immunodeficiency virus (HIV) disease (HCC) 12/20/2013    Palliative Care Assessment & Plan    Assessment/Recommendations/Plan  Respiratory failure  in setting of severe opportunistic pneumonia, HIV- plan for full scope, full code To have tracheostomy placed tomorrow PMT will continue to follow   Code Status:   Code Status: Full Code   Prognosis:  Unable to determine  Discharge Planning: To Be Determined  Care plan was discussed with bedside RN, patient's mother  Thank you for allowing the Palliative Medicine Team to assist in the care of this patient.   Prolonged billing:  Time includes:   Preparing to see the patient (e.g., review of tests) Obtaining and/or reviewing separately obtained history Performing a medically necessary appropriate examination and/or evaluation Counseling and educating the patient/family/caregiver Ordering medications, tests, or procedures Referring and communicating with other health care professionals (when not reported separately) Documenting clinical information in the electronic or other health record Independently interpreting results (not reported separately) and communicating results to the patient/family/caregiver Care coordination (not reported separately) Clinical documentation  Cassondra Stain, AGNP-C Palliative Medicine   Please contact Palliative Medicine Team phone at [phone] for questions and concerns.

## 2023-12-05 NOTE — Procedures (Signed)
 Diagnostic Bronchoscopy  Cory Russell  [id number]  10/21/1990  Date:12/05/23  Time:11:37 AM   Provider Performing:Correy Weidner C Claudene   Procedure: Diagnostic Bronchoscopy (68377), bronch w BAL  Indication(s) Assist with direct visualization of tracheostomy placement; fevers  Consent Risks of the procedure as well as the alternatives and risks of each were explained to the patient and/or caregiver.  Consent for the procedure was obtained.   Anesthesia See separate tracheostomy note   Time Out Verified patient identification, verified procedure, site/side was marked, verified correct patient position, special equipment/implants available, medications/allergies/relevant history reviewed, required imaging and test results available.   Sterile Technique Usual hand hygiene, masks, gowns, and gloves were used   Procedure Description Bronchoscope advanced through endotracheal tube and into airway.  After suctioning out tracheal secretions, bronchoscope used to provide direct visualization of tracheostomy placement.  Tracheostomy within tracheal lumen as is entirely of balloon.  Post trach, performed BAL RML due to immunocompromised fevers.  Complications/Tolerance None; patient tolerated the procedure well.   EBL None  Specimen(s) RML BAL

## 2023-12-05 NOTE — Progress Notes (Signed)
 SLP Cancellation Note  Patient Details Name: Cory Russell MRN: [id number] DOB: 1991-01-27   Cancelled treatment:       Reason Eval/Treat Not Completed: Patient with new tracheostomy. Orders for SLP eval and treat for PMSV and swallowing received. Will follow pt closely for readiness for SLP interventions as appropriate.     Damien Blumenthal, M.A., CCC-SLP Speech Language Pathology, Acute Rehabilitation Services  Secure Chat preferred [phone]  12/05/2023, 11:48 AM

## 2023-12-05 NOTE — Progress Notes (Addendum)
 NAME:  Cory Russell, MRN:  [id number], DOB:  11-10-1990, LOS: 27 ADMISSION DATE:  11/08/2023, CONSULTATION DATE: 11/19/2023 REFERRING MD:  Cory Russell - TRH, CHIEF COMPLAINT:  Respiratory failure   History of Present Illness:  Cory Russell is a 33 y.o. man with past medical history of HIV/AIDS, CD4<35 on admission, not on ARVs. Presents with seizure like activity 11 days ago and found to have sepsis from pneumonia.  He notes symptoms started about 2-3 weeks ago with shortness of breath and dry cough. He had two separate rounds of antibiotics visiting other urgent care and ED visits. He has tried albuterol  nebulizer treatments from friends and initially found them helpful. CT Chest shows bilateral ground glass opacities concerning for pneumoncystis pneumonia. Since being here he has been Started on empiric antibiotics with bactrim  and steroids. Also resumed ARVs during this admission. Has been persistently hypoxemic on 10LNC. Pulmonary consulted to assist with evaluation and management and for bronchoscopy.   He feels persistently short of breath, worse with exertion. He feels like chest PT is helping him. Feeling more anxious. Also having difficulty taking pills.   Pertinent Medical History:  HIV, Seizure disorder  Significant Hospital Events: Including procedures, antibiotic start and stop dates in addition to other pertinent events   6/04 Admit 6/15 Pulmonary consult 6/18 Transient desats with worsening chest x-ray.  Prednisone  changed to IV Solu-Medrol  6/22 Worsening respiratory status requiring ICU transfer and intubation 6/23 Bronch for BAL per ID request. NO being weaned from 20 to 15. Overnight L PTX s/p chest tube. 6/25 Required transition to SIMV vent mode due to dyssynchrony. Sedation increased. Paralytic push given. CXR improved s/p diuresis. Required secondary L pigtail for persistent PTX.  6/26 Large bore chest tube placed, pigtail removed. 6/30 discussed plan for trach. Weaning  sedation   Interim History / Subjective:   Had some tachycardia overnight which improved w/ fluids. Net negative 5.7 L; last 24 hours UOP 3.2 L  On PRVC 50%, 8 peep with sats 95%. Peak pressure 21 On Versed  and fentanyl  drip   Objective:   Blood pressure 114/67, pulse (!) 104, temperature 98.4 F (36.9 C), temperature source Esophageal, resp. rate 18, height 5' 7 (1.702 m), weight 42.1 kg, SpO2 93%.    Vent Mode: SIMV;PSV;PRVC FiO2 (%):  [50 %] 50 % Set Rate:  [15 bmp-18 bmp] 18 bmp Vt Set:  [410 mL] 410 mL PEEP:  [5 cmH20-8 cmH20] 8 cmH20 Pressure Support:  [10 cmH20] 10 cmH20 Plateau Pressure:  [18 cmH20-25 cmH20] 25 cmH20   Intake/Output Summary (Last 24 hours) at 12/05/2023 0715 Last data filed at 12/05/2023 0500 Gross per 24 hour  Intake 4109.3 ml  Output 3590 ml  Net 519.3 ml   Filed Weights   12/02/23 0701 12/04/23 0500 12/05/23 0500  Weight: 44.6 kg 43.6 kg 42.1 kg   Physical Examination:  General critically ill appearing on mech vent HEENT: MM pink/moist; ETT in place Neuro: sedate; cough/gag present; perrl CV: s1s2, tachy 110s, no m/r/g PULM:  dim clear BS bilaterally; on mech vent PRVC; left CT in place GI: soft, bsx4 active  Extremities: warm/dry, no edema    Assessment and Plan:    Acute resp failure w hypoxia and hypercarbia PJP PNA ARDS CMV viremia L ptx  HIV/ AIDS IRIS  Sz disorder Hyperglycemia Anemia - critical illness Severe protein calorie malnutrition  P: -Plan for trach today -hold TF this morning for procedure -check morning labs -decrease dose of diuretics; monitor UOP -LTVV strategy with  tidal volumes of 6-8 cc/kg ideal body weight -Goal plateau pressures less than 30 and driving pressures less than 15 -Wean PEEP/FiO2 for SpO2 >90% -VAP bundle in place -on brovana /yupelri ; prn duoneb -cont CT to -20 cm H20 suction -trend CXR -ID following: cont biktarvy , darunavir -cobibistat, ganciclovir , azithro, dapsone   -steroids -ssi  and cbg monitoring -cont oxy and clonazepam per tube -cont metoprolol     Best Practice (right click and Reselect all SmartList Selections daily)   Diet/type: NPO DVT prophylaxis prophylactic heparin   Pressure ulcer(s): N/A GI prophylaxis: H2B Lines: arterial line Foley:  Yes, and it is still needed Code Status:  full code Last date of multidisciplinary goals of care discussion: 7/1 updated mother at bedside. Questions answered. Plan for trach today   CRITICAL CARE Performed by: Cory Russell   Total critical care time: 35 minutes  Cory Russell [facility] 12/05/2023, 8:15 AM  Please see [email or web address] for pager details.  From 7A-7P if no response, please call [phone]. After hours, please call ELink [phone].

## 2023-12-05 NOTE — Procedures (Signed)
 Percutaneous Tracheostomy Procedure Note   Cory Russell  [id number]  1990/07/26  Date:12/05/23  Time:2:48 PM   Provider Performing:Carma Dwiggins  Procedure: Percutaneous Tracheostomy with Bronchoscopic Guidance (68399)  Indication(s) Acute respiratory failure with hypoxia  Consent Risks of the procedure as well as the alternatives and risks of each were explained to the patient and/or caregiver.  Consent for the procedure was obtained.  Anesthesia Etomidate , Versed , Fentanyl , Vecuronium    Time Out Verified patient identification, verified procedure, site/side was marked, verified correct patient position, special equipment/implants available, medications/allergies/relevant history reviewed, required imaging and test results available.   Sterile Technique Maximal sterile technique including sterile barrier drape, hand hygiene, sterile gown, sterile gloves, mask, hair covering.    Procedure Description Appropriate anatomy identified by palpation.  Patient's neck prepped and draped in sterile fashion.  1% lidocaine  with epinephrine  was used to anesthetize skin overlying neck.  1.5cm incision made and blunt dissection performed until tracheal rings could be easily palpated.   Then a size 6 Shiley tracheostomy was placed under bronchoscopic visualization using usual Seldinger technique and serial dilation.   Bronchoscope confirmed placement above the carina.  Tracheostomy was sutured in place with adhesive pad to protect skin under pressure.    Patient connected to ventilator.   Complications/Tolerance None; patient tolerated the procedure well. Chest X-ray is ordered to confirm no post-procedural complication.   EBL Minimal   Specimen(s) None

## 2023-12-05 NOTE — Plan of Care (Signed)
  Problem: Elimination: Goal: Will not experience complications related to bowel motility Outcome: Progressing Goal: Will not experience complications related to urinary retention Outcome: Progressing   Problem: Fluid Volume: Goal: Ability to maintain a balanced intake and output will improve Outcome: Progressing   Problem: Metabolic: Goal: Ability to maintain appropriate glucose levels will improve Outcome: Progressing

## 2023-12-05 DEATH — deceased

## 2023-12-06 ENCOUNTER — Inpatient Hospital Stay (HOSPITAL_COMMUNITY)

## 2023-12-06 DIAGNOSIS — E871 Hypo-osmolality and hyponatremia: Secondary | ICD-10-CM | POA: Diagnosis not present

## 2023-12-06 DIAGNOSIS — J80 Acute respiratory distress syndrome: Secondary | ICD-10-CM | POA: Diagnosis not present

## 2023-12-06 DIAGNOSIS — B20 Human immunodeficiency virus [HIV] disease: Secondary | ICD-10-CM | POA: Diagnosis not present

## 2023-12-06 DIAGNOSIS — J95851 Ventilator associated pneumonia: Secondary | ICD-10-CM | POA: Diagnosis not present

## 2023-12-06 DIAGNOSIS — E875 Hyperkalemia: Secondary | ICD-10-CM | POA: Diagnosis not present

## 2023-12-06 DIAGNOSIS — J939 Pneumothorax, unspecified: Secondary | ICD-10-CM | POA: Diagnosis not present

## 2023-12-06 DIAGNOSIS — B250 Cytomegaloviral pneumonitis: Principal | ICD-10-CM | POA: Diagnosis not present

## 2023-12-06 LAB — CULTURE, BLOOD (ROUTINE X 2)
Culture: NO GROWTH
Culture: NO GROWTH
Special Requests: ADEQUATE
Special Requests: ADEQUATE

## 2023-12-06 LAB — CBC
HCT: 25.2 % — ABNORMAL LOW (ref 39.0–52.0)
Hemoglobin: 7.8 g/dL — ABNORMAL LOW (ref 13.0–17.0)
MCH: 30.5 pg (ref 26.0–34.0)
MCHC: 31 g/dL (ref 30.0–36.0)
MCV: 98.4 fL (ref 80.0–100.0)
Platelets: 216 10*3/uL (ref 150–400)
RBC: 2.56 MIL/uL — ABNORMAL LOW (ref 4.22–5.81)
RDW: 16.8 % — ABNORMAL HIGH (ref 11.5–15.5)
WBC: 6.6 10*3/uL (ref 4.0–10.5)
nRBC: 0.6 % — ABNORMAL HIGH (ref 0.0–0.2)

## 2023-12-06 LAB — GLUCOSE, CAPILLARY
Glucose-Capillary: 135 mg/dL — ABNORMAL HIGH (ref 70–99)
Glucose-Capillary: 191 mg/dL — ABNORMAL HIGH (ref 70–99)
Glucose-Capillary: 196 mg/dL — ABNORMAL HIGH (ref 70–99)
Glucose-Capillary: 219 mg/dL — ABNORMAL HIGH (ref 70–99)
Glucose-Capillary: 246 mg/dL — ABNORMAL HIGH (ref 70–99)
Glucose-Capillary: 248 mg/dL — ABNORMAL HIGH (ref 70–99)
Glucose-Capillary: 256 mg/dL — ABNORMAL HIGH (ref 70–99)
Glucose-Capillary: 68 mg/dL — ABNORMAL LOW (ref 70–99)

## 2023-12-06 LAB — BASIC METABOLIC PANEL WITH GFR
Anion gap: 9 (ref 5–15)
BUN: 63 mg/dL — ABNORMAL HIGH (ref 6–20)
CO2: 39 mmol/L — ABNORMAL HIGH (ref 22–32)
Calcium: 8.5 mg/dL — ABNORMAL LOW (ref 8.9–10.3)
Chloride: 101 mmol/L (ref 98–111)
Creatinine, Ser: 0.85 mg/dL (ref 0.61–1.24)
GFR, Estimated: >60 mL/min (ref >60)
Glucose, Bld: 239 mg/dL — ABNORMAL HIGH (ref 70–99)
Potassium: 4.7 mmol/L (ref 3.5–5.1)
Sodium: 149 mmol/L — ABNORMAL HIGH (ref 135–145)

## 2023-12-06 MED ORDER — SODIUM CHLORIDE 0.9% IV SOLUTION: Freq: Once | INTRAVENOUS | Status: DC

## 2023-12-06 MED ORDER — INSULIN ASPART 100 UNIT/ML IJ SOLN
3.0000 [IU] | INTRAMUSCULAR | Status: DC
  Administered 2023-12-06 (×3): 3 [IU] via SUBCUTANEOUS

## 2023-12-06 MED ORDER — DEXTROSE 50 % IV SOLN
12.5000 g | INTRAVENOUS | Status: AC
  Administered 2023-12-06: 12.5 g via INTRAVENOUS

## 2023-12-06 MED ORDER — DEXMEDETOMIDINE HCL IN NACL 400 MCG/100ML IV SOLN
0.2000 ug/kg/h | INTRAVENOUS | Status: DC
  Administered 2023-12-06: 0.9 ug/kg/h via INTRAVENOUS
  Administered 2023-12-06: 0.4 ug/kg/h via INTRAVENOUS
  Administered 2023-12-07: 0.5 ug/kg/h via INTRAVENOUS
  Filled 2023-12-06 (×3): qty 100

## 2023-12-06 MED ORDER — CLONAZEPAM 0.5 MG PO TBDP
1.0000 mg | ORAL_TABLET | Freq: Three times a day (TID) | ORAL | Status: DC
  Administered 2023-12-06 – 2023-12-14 (×23): 1 mg
  Filled 2023-12-06 (×4): qty 2
  Filled 2023-12-06: qty 4
  Filled 2023-12-06 (×8): qty 2
  Filled 2023-12-06: qty 4
  Filled 2023-12-06 (×9): qty 2

## 2023-12-06 MED ORDER — DEXTROSE 50 % IV SOLN
INTRAVENOUS | Status: AC
  Filled 2023-12-06: qty 50

## 2023-12-06 NOTE — Progress Notes (Signed)
 PT Cancellation Note  Patient Details Name: Cory Russell MRN: [id number] DOB: 1990/08/18   Cancelled Treatment:    Reason Eval/Treat Not Completed: Medical issues which prohibited therapy (Pt too unstable to work with PT today per nurse. Will check back at later date.)   [name] [name] 12/06/2023, 11:36 AM [name],PT Acute Rehab Services [phone]

## 2023-12-06 NOTE — Progress Notes (Signed)
 [facility]  Date of Admission:  11/08/2023     Reason for Follow Up: Sepsis [facility])  Total days of antibiotics 29         ASSESSMENT:  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  Status post tracheostomy yesterday.  Opens eyes to voice but does not follow commands.  Discussed plan of care to continue with current dose of Biktarvy  for ART and dapsone  for OI prophylaxis and secondary prevention of PJP.  Continue current dose of ganciclovir  for CMV pneumonitis and will recheck CMV levels.  Plan of care discussed with mom at bedside.  Remaining medical and supportive care per PCCM.  PLAN:  Continue current dose of Biktarvy  for ART and dapsone  for OI prophylaxis and secondary PCP prevention Continue ganciclovir  for CMV pneumonitis and recheck CMV levels. Tracheostomy and remaining medical and supportive care per PCCM.  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)   Pneumonia due to Pneumocystis jirovecii (HCC)   Hypoxia   Healthcare associated bacterial pneumonia   Pneumothorax   IRIS (immune reconstitution inflammatory syndrome) (HCC)   Cytomegalovirus (CMV) viremia (HCC)   Fever    arformoterol   15 mcg Nebulization BID   azithromycin   1,200 mg Per Tube Weekly   bictegravir-emtricitabine -tenofovir  AF  1 tablet Oral Daily   Chlorhexidine  Gluconate Cloth  6 each Topical Daily    clonazepam  1 mg Per Tube TID   dapsone   100 mg Per Tube Daily   darunavir -cobicistat   1 tablet Oral Q breakfast   docusate  100 mg Per Tube BID   etomidate   20 mg Intravenous Once   famotidine   20 mg Per Tube BID   fentaNYL  (SUBLIMAZE ) injection  200 mcg Intravenous Once   free water  200 mL Per Tube Q4H   furosemide   40 mg Intravenous Q12H   heparin  injection (subcutaneous)  5,000 Units Subcutaneous Q8H   insulin  aspart  0-20 Units Subcutaneous Q4H   insulin  aspart  3 Units Subcutaneous Q4H   lidocaine   1 patch Transdermal Q24H   lidocaine -EPINEPHrine   20 mL Intradermal Once   methylPREDNISolone  (SOLU-MEDROL ) injection  40 mg Intravenous Q12H   metoprolol  tartrate  25 mg Per Tube Q8H   midazolam   5 mg Intravenous Once   multivitamin with minerals  1 tablet Per Tube Daily   mouth rinse  15 mL Mouth Rinse Q2H   oxyCODONE   10 mg Oral Q6H   polyethylene glycol  17 g Per Tube Daily   revefenacin   175 mcg Nebulization Daily   valproic  acid  500 mg Per Tube BID    SUBJECTIVE:  Afebrile overnight with no acute events. S/p tracheostomy. Mother at bedside.   No Known Allergies   Review of Systems: Review of Systems  Unable to perform ROS: Medical condition      OBJECTIVE: Vitals:   12/06/23 0800 12/06/23 0815 12/06/23 0912 12/06/23 1101  BP: 123/77 135/78 130/81   Pulse: (!) 112 (!)  104 (!) 126   Resp: (!) 22 (!) 22 (!) 31   Temp:    100.2 F (37.9 C)  TempSrc:    Axillary  SpO2: 95% 91% 97%   Weight:      Height:       Body mass index is 13.22 kg/m.  Physical Exam Constitutional:      General: He is not in acute distress.    Appearance: He is well-developed.     Comments: Lying in bed with head of bed elevated.  Eyes open to voice and does not follow commands  Eyes:     Conjunctiva/sclera: Conjunctivae normal.  Cardiovascular:     Rate and Rhythm: Regular rhythm. Tachycardia present.     Heart sounds: Normal heart sounds. No murmur heard.    No friction rub.  No gallop.  Pulmonary:     Effort: Pulmonary effort is normal. Tachypnea present. No respiratory distress.     Breath sounds: Normal breath sounds. No wheezing or rales.     Comments: Tracheostomy in place Chest:     Chest wall: No tenderness.  Abdominal:     General: Bowel sounds are normal.     Palpations: Abdomen is soft.     Tenderness: There is no abdominal tenderness.  Musculoskeletal:     Cervical back: Neck supple.  Lymphadenopathy:     Cervical: No cervical adenopathy.  Skin:    General: Skin is warm and dry.     Findings: No rash.  Neurological:     Mental Status: He is oriented to person, place, and time.     Lab Results Lab Results  Component Value Date   WBC 6.6 12/06/2023   HGB 7.8 (L) 12/06/2023   HCT 25.2 (L) 12/06/2023   MCV 98.4 12/06/2023   PLT 216 12/06/2023    Lab Results  Component Value Date   CREATININE 0.85 12/06/2023   BUN 63 (H) 12/06/2023   NA 149 (H) 12/06/2023   K 4.7 12/06/2023   CL 101 12/06/2023   CO2 39 (H) 12/06/2023    Lab Results  Component Value Date   ALT 15 11/18/2023   AST 25 11/18/2023   ALKPHOS 57 11/18/2023   BILITOT 0.4 11/18/2023     Microbiology: Recent Results (from the past 240 hours)  Culture, Respiratory w Gram Stain     Status: None   Collection Time: 11/27/23  9:27 AM   Specimen: Bronchoalveolar Lavage; Respiratory  Result Value Ref Range Status   Specimen Description BRONCHIAL ALVEOLAR LAVAGE  Final   Special Requests NONE  Final   Gram Stain   Final    NO WBC SEEN NO ORGANISMS SEEN Performed at [facility], [address], [place], [place] [address]    Culture RARE CANDIDA ALBICANS  Final   Report Status 11/30/2023 FINAL  Final  Acid Fast Smear (AFB)     Status: None   Collection Time: 11/27/23  9:27 AM   Specimen: Bronchoalveolar Lavage; Respiratory  Result Value Ref Range Status   AFB Specimen Processing Concentration  Final   Acid Fast Smear Negative  Final    Comment:  (NOTE) Performed At: [facility] [address] [place], [place] [id number] Jennette Shorter MD Ey:[id number]    Source (AFB) BRONCHIAL ALVEOLAR LAVAGE  Final    Comment: Performed at [facility], [address], [place], [place] [address]  Fungus Culture With Stain     Status: None (Preliminary result)   Collection Time:  11/27/23  9:27 AM   Specimen: Bronchoalveolar Lavage; Respiratory  Result Value Ref Range Status   Fungus Stain Final report  Final    Comment: (NOTE) Performed At: [facility] [address] [place], [place] [id number] Jennette Shorter MD Ey:[id number]    Fungus (Mycology) Culture PENDING  Incomplete   Fungal Source BRONCHIAL ALVEOLAR LAVAGE  Final    Comment: Performed at [facility], [address], [place], [place] [address]  Virus culture     Status: Abnormal   Collection Time: 11/27/23  9:27 AM   Specimen: Bronchoalveolar Lavage  Result Value Ref Range Status   Viral Culture Comment (A)  Final    Comment: (NOTE) Positive Cytomegalovirus detected by immunofluorescent monoclonal antibody staining in shell vial culture. Performed At: [facility] [address] [place], [place] [id number] Jennette Shorter MD Ey:[id number]    Source of Sample BRONCHIAL ALVEOLAR LAVAGE  Final    Comment: Performed at [facility], [address], [place], [place] [address]  Fungus Culture Result     Status: None   Collection Time: 11/27/23  9:27 AM  Result Value Ref Range Status   Result 1 Comment  Final    Comment: (NOTE) KOH/Calcofluor preparation:  no fungus observed. Performed At: [facility] [address] [place], [place] [id number] Jennette Shorter MD Ey:[id number]   Culture, blood (Routine X 2) w Reflex to ID Panel     Status: None   Collection Time: 12/01/23 12:43 PM   Specimen: BLOOD  Result Value Ref Range Status   Specimen Description BLOOD SITE NOT SPECIFIED  Final   Special Requests   Final    BOTTLES  DRAWN AEROBIC AND ANAEROBIC Blood Culture adequate volume   Culture   Final    NO GROWTH 5 DAYS Performed at [facility], [address] [place], [place] [address]    Report Status 12/06/2023 FINAL  Final  Culture, blood (Routine X 2) w Reflex to ID Panel     Status: None   Collection Time: 12/01/23 12:44 PM   Specimen: BLOOD  Result Value Ref Range Status   Specimen Description BLOOD SITE NOT SPECIFIED  Final   Special Requests   Final    BOTTLES DRAWN AEROBIC AND ANAEROBIC Blood Culture adequate volume   Culture   Final    NO GROWTH 5 DAYS Performed at [facility], [address] [place], [place] [address]    Report Status 12/06/2023 FINAL  Final  Culture, Respiratory w Gram Stain     Status: None (Preliminary result)   Collection Time: 12/05/23 11:40 AM   Specimen: Bronchoalveolar Lavage; Respiratory  Result Value Ref Range Status   Specimen Description BRONCHIAL ALVEOLAR LAVAGE  Final   Special Requests NONE  Final   Gram Stain   Final    RARE WBC PRESENT, PREDOMINANTLY MONONUCLEAR NO ORGANISMS SEEN    Culture   Final    CULTURE REINCUBATED FOR BETTER GROWTH Performed at [facility], [address] [place], [place] [address]    Report Status PENDING  Incomplete    I have personally spent 28 minutes involved in face-to-face and non-face-to-face activities for this patient on the day of the visit. Professional time spent includes the following activities: preparing to see the patient (review of tests), performing a medically appropriate examination, ordering medications, communicating with other health care professionals, documenting clinical information in the EMR, communicating results and counseling patient and family regarding medication  and plan of care, and care coordination.    Greg Keron Neenan, NP [facility]  12/06/2023  12:19 PM

## 2023-12-06 NOTE — Progress Notes (Signed)
 NAME:  Cory Russell, MRN:  [id number], DOB:  March 18, 1991, LOS: 28 ADMISSION DATE:  11/08/2023, CONSULTATION DATE: 11/19/2023 REFERRING MD:  Jonel - TRH, CHIEF COMPLAINT:  Respiratory failure   History of Present Illness:  Cory Russell is a 33 y.o. man with past medical history of HIV/AIDS, CD4<35 on admission, not on ARVs. Presents with seizure like activity 11 days ago and found to have sepsis from pneumonia.  He notes symptoms started about 2-3 weeks ago with shortness of breath and dry cough. He had two separate rounds of antibiotics visiting other urgent care and ED visits. He has tried albuterol  nebulizer treatments from friends and initially found them helpful. CT Chest shows bilateral ground glass opacities concerning for pneumoncystis pneumonia. Since being here he has been Started on empiric antibiotics with bactrim  and steroids. Also resumed ARVs during this admission. Has been persistently hypoxemic on 10LNC. Pulmonary consulted to assist with evaluation and management and for bronchoscopy.   He feels persistently short of breath, worse with exertion. He feels like chest PT is helping him. Feeling more anxious. Also having difficulty taking pills.   Pertinent Medical History:  HIV, Seizure disorder  Significant Hospital Events: Including procedures, antibiotic start and stop dates in addition to other pertinent events   6/04 Admit 6/15 Pulmonary consult 6/18 Transient desats with worsening chest x-ray.  Prednisone  changed to IV Solu-Medrol  6/22 Worsening respiratory status requiring ICU transfer and intubation 6/23 Bronch for BAL per ID request. NO being weaned from 20 to 15. Overnight L PTX s/p chest tube. 6/25 Required transition to SIMV vent mode due to dyssynchrony. Sedation increased. Paralytic push given. CXR improved s/p diuresis. Required secondary L pigtail for persistent PTX.  6/26 Large bore chest tube placed, pigtail removed. 6/30 discussed plan for trach. Weaning  sedation  7/1 trach 7/2 weaning sedation   Interim History / Subjective:  NAEO   Objective:   Blood pressure 130/81, pulse (!) 126, temperature 100.2 F (37.9 C), temperature source Axillary, resp. rate (!) 31, height 5' 7 (1.702 m), weight 38.3 kg, SpO2 97%.    Vent Mode: PSV;CPAP FiO2 (%):  [40 %-60 %] 50 % Set Rate:  [18 bmp] 18 bmp Vt Set:  [410 mL] 410 mL PEEP:  [8 cmH20] 8 cmH20 Pressure Support:  [8 cmH20-10 cmH20] 8 cmH20 Plateau Pressure:  [26 cmH20-30 cmH20] 28 cmH20   Intake/Output Summary (Last 24 hours) at 12/06/2023 1121 Last data filed at 12/06/2023 0900 Gross per 24 hour  Intake 2949.52 ml  Output 4160 ml  Net -1210.48 ml   Filed Weights   12/04/23 0500 12/05/23 0500 12/06/23 0435  Weight: 43.6 kg 42.1 kg 38.3 kg   Physical Examination:  General critically and chronically ill adult M  HEENT: Trach secure anicteric sclera  Neuro: Does not follow commands. Spontaneous eye opening   CV: tachycardic  PULM:  L chest tube. Mechanically ventilated  GI: soft ndnt  Extremities: symmetrically decr muscle mass BUE BLE   Assessment and Plan:    Acute encephalopathy Sz disorder  -Wean fent versed . SCH oxy + PRN, incr SCH clonazepam  -delirium precautions  -depakote    Acute resp failure w hypoxia / hypercarbia ARDS, improved Presumed PJP PNA L ptx S/p trach (7/1) Pneumomediastinum  P wean vent as tolerated -- did not tolerate FiO2 to 40% and is on 50% but doing ok with PSV. Ultimate goal ATC  -VAP, pulm hygiene -routine trach care  -cont chest tube to sxn -PRN CXR. Possible CT if not  improving -follow BAL   HIV/AIDS Probable IRIS  -ID following: cont biktarvy , darunavir -cobibistat, ganciclovir , azithro, dapsone   -steroids  Hyperglycemia -SSI + EN coverage  Anemia  -PRN CBC   Hypernatremia -cont FWF, may have to incr      Best Practice (right click and Reselect all SmartList Selections daily)   Diet/type: EN DVT prophylaxis  prophylactic heparin   Pressure ulcer(s): N/A GI prophylaxis: H2B Lines: na Foley:  Yes, and it is still needed Code Status:  full code Last date of multidisciplinary goals of care discussion: 7/2 updated mom at bedside    CRITICAL CARE Performed by: Ronnald FORBES Gave   Total critical care time: 39 minutes  Critical care time was exclusive of separately billable procedures and treating other patients. Critical care was necessary to treat or prevent imminent or life-threatening deterioration.  Critical care was time spent personally by me on the following activities: development of treatment plan with patient and/or surrogate as well as nursing, discussions with consultants, evaluation of patient's response to treatment, examination of patient, obtaining history from patient or surrogate, ordering and performing treatments and interventions, ordering and review of laboratory studies, ordering and review of radiographic studies, pulse oximetry and re-evaluation of patient's condition.  Ronnald Gave MSN, AGACNP-BC [place] [facility] Amion for pager  12/06/2023, 11:21 AM

## 2023-12-06 NOTE — Progress Notes (Signed)
 Nutrition Follow-up  DOCUMENTATION CODES:   Severe malnutrition in context of chronic illness, Underweight  INTERVENTION:   Tube Feeding via Cortrak:  Increase Vital AF 1.2 to 70 ml/hr TF provides 2016 kcals, 126 g of protein and 1360 mL of free water  Free Water Flush: per MD, current free water flush of 200 mL q 4 hours provides additional 1200 mL of free water  Add nutrisource fiber BID as stool bulking agent  Recommend changing scheduled bowel regiment to prn to ensure pt not receiving while having type 7 stool via FMS   NUTRITION DIAGNOSIS:   Severe Malnutrition related to chronic illness (HIV) as evidenced by severe muscle depletion, severe fat depletion.  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  6/04 Admitted 6/22 Intubated, TF initiated at 10, goal 40 6/23 Gastric Cortrak placed, Chest tube overnight for tension pneumothorax 6/24 Changed to Nepro secondary to persistent hyperkalemia despite interventions 6/25 Secondary Pigtail CT placed 6/26 Large bore chest tube placed, Pigtail removed 6/30 Hyperkalemia improved, significant stool, changed to Vital AF 1.2 7/01 Trach placed  Pt remains on vent support via trach  Tolerating Vital AF 1.2 at 65 ml/hr via Cortrak  Noted free water increased to 200 mL q 4 hours on 7/01 (up from 100 mL q 4 hours). Given sodium trend, may need to increase further.  Pt also receiving lasix  40 mg IV BID  Hx HIV, probable IRIS this admission. Pt receiving biktarvy , darunavir -cobibistat, ganciclovir , azithro,  dapsone . Infectious Disease following  UOP 3.3 L in 24 hours. Net negative 5L this admission per I/O flow sheet  Weight down to 39.5 kg (38 kg yesterday-lowest wt since admission)  FMS with 445 mL of stool in 24 hours Chest tube with 0 mL  Labs: Sodium 148->146->149->147 (H) BUN 54 (H) Creatinine wdl CBGs 156-294, one episode of hypoglycemia overnight  Meds:  SS novolog  Novolog  3 units q 4 hours  Solumedrol MVI with Minerals Lasix  BID   Diet Order:   Diet Order             Diet NPO time specified  Diet effective 0500                   EDUCATION NEEDS:   No education needs have been identified at this time  Skin:  Skin Assessment: Reviewed RN Assessment  Last BM:  7/3 via FMS, noted stool in tubing but not in bag of FMS, noted stool in bed as well.  Height:   Ht Readings from Last 1 Encounters:  11/30/23 5' 7 (1.702 m)    Weight:   Wt Readings from Last 1 Encounters:  12/07/23 39.5 kg     BMI:  Body mass index is 13.64 kg/m.  Estimated Nutritional Needs:   Kcal:  1750-1950 kcals  Protein:  95-115 g  Fluid:  >1.8 L/day   Betsey Finger MS, RDN, LDN, CNSC Registered Dietitian 3 Clinical Nutrition RD Inpatient Contact Info in Amion

## 2023-12-07 DIAGNOSIS — E119 Type 2 diabetes mellitus without complications: Secondary | ICD-10-CM

## 2023-12-07 DIAGNOSIS — E870 Hyperosmolality and hypernatremia: Secondary | ICD-10-CM

## 2023-12-07 DIAGNOSIS — B20 Human immunodeficiency virus [HIV] disease: Secondary | ICD-10-CM | POA: Diagnosis not present

## 2023-12-07 DIAGNOSIS — J9602 Acute respiratory failure with hypercapnia: Secondary | ICD-10-CM | POA: Diagnosis not present

## 2023-12-07 DIAGNOSIS — G9340 Encephalopathy, unspecified: Secondary | ICD-10-CM | POA: Diagnosis not present

## 2023-12-07 DIAGNOSIS — J9601 Acute respiratory failure with hypoxia: Secondary | ICD-10-CM | POA: Diagnosis not present

## 2023-12-07 LAB — GLUCOSE, CAPILLARY
Glucose-Capillary: 142 mg/dL — ABNORMAL HIGH (ref 70–99)
Glucose-Capillary: 156 mg/dL — ABNORMAL HIGH (ref 70–99)
Glucose-Capillary: 161 mg/dL — ABNORMAL HIGH (ref 70–99)
Glucose-Capillary: 166 mg/dL — ABNORMAL HIGH (ref 70–99)
Glucose-Capillary: 192 mg/dL — ABNORMAL HIGH (ref 70–99)
Glucose-Capillary: 215 mg/dL — ABNORMAL HIGH (ref 70–99)
Glucose-Capillary: 294 mg/dL — ABNORMAL HIGH (ref 70–99)

## 2023-12-07 LAB — BASIC METABOLIC PANEL WITH GFR
Anion gap: 7 (ref 5–15)
BUN: 54 mg/dL — ABNORMAL HIGH (ref 6–20)
CO2: 38 mmol/L — ABNORMAL HIGH (ref 22–32)
Calcium: 8.4 mg/dL — ABNORMAL LOW (ref 8.9–10.3)
Chloride: 102 mmol/L (ref 98–111)
Creatinine, Ser: 0.82 mg/dL (ref 0.61–1.24)
GFR, Estimated: >60 mL/min (ref >60)
Glucose, Bld: 267 mg/dL — ABNORMAL HIGH (ref 70–99)
Potassium: 4.2 mmol/L (ref 3.5–5.1)
Sodium: 147 mmol/L — ABNORMAL HIGH (ref 135–145)

## 2023-12-07 LAB — CBC
HCT: 24.3 % — ABNORMAL LOW (ref 39.0–52.0)
Hemoglobin: 7.6 g/dL — ABNORMAL LOW (ref 13.0–17.0)
MCH: 30.5 pg (ref 26.0–34.0)
MCHC: 31.3 g/dL (ref 30.0–36.0)
MCV: 97.6 fL (ref 80.0–100.0)
Platelets: 215 10*3/uL (ref 150–400)
RBC: 2.49 MIL/uL — ABNORMAL LOW (ref 4.22–5.81)
RDW: 16.9 % — ABNORMAL HIGH (ref 11.5–15.5)
WBC: 6.8 10*3/uL (ref 4.0–10.5)
nRBC: 0.7 % — ABNORMAL HIGH (ref 0.0–0.2)

## 2023-12-07 LAB — ACID FAST SMEAR (AFB, MYCOBACTERIA): Acid Fast Smear: NEGATIVE

## 2023-12-07 LAB — CULTURE, RESPIRATORY W GRAM STAIN

## 2023-12-07 MED ORDER — QUETIAPINE FUMARATE 25 MG PO TABS: 25.0000 mg | ORAL_TABLET | Freq: Every day | ORAL | Status: DC

## 2023-12-07 MED ORDER — HYDROXYZINE HCL 25 MG PO TABS
25.0000 mg | ORAL_TABLET | Freq: Three times a day (TID) | ORAL | Status: DC | PRN
  Administered 2023-12-08 – 2023-12-11 (×5): 25 mg
  Filled 2023-12-07 (×5): qty 1

## 2023-12-07 MED ORDER — DOCUSATE SODIUM 50 MG/5ML PO LIQD: 100.0000 mg | Freq: Two times a day (BID) | ORAL | Status: DC | PRN

## 2023-12-07 MED ORDER — THIAMINE MONONITRATE 100 MG PO TABS
100.0000 mg | ORAL_TABLET | Freq: Every day | ORAL | Status: DC
  Administered 2023-12-07 – 2023-12-16 (×10): 100 mg
  Filled 2023-12-07 (×10): qty 1

## 2023-12-07 MED ORDER — INSULIN ASPART 100 UNIT/ML IJ SOLN
0.0000 [IU] | INTRAMUSCULAR | Status: DC
  Administered 2023-12-07: 1 [IU] via SUBCUTANEOUS
  Administered 2023-12-07 – 2023-12-08 (×4): 2 [IU] via SUBCUTANEOUS
  Administered 2023-12-08 (×2): 3 [IU] via SUBCUTANEOUS
  Administered 2023-12-08 – 2023-12-09 (×2): 2 [IU] via SUBCUTANEOUS
  Administered 2023-12-09: 3 [IU] via SUBCUTANEOUS
  Administered 2023-12-09: 2 [IU] via SUBCUTANEOUS
  Administered 2023-12-10: 1 [IU] via SUBCUTANEOUS
  Administered 2023-12-10 (×2): 3 [IU] via SUBCUTANEOUS
  Administered 2023-12-10 (×2): 2 [IU] via SUBCUTANEOUS
  Administered 2023-12-11: 3 [IU] via SUBCUTANEOUS
  Administered 2023-12-11: 8 [IU] via SUBCUTANEOUS
  Administered 2023-12-12 (×3): 1 [IU] via SUBCUTANEOUS
  Administered 2023-12-12: 2 [IU] via SUBCUTANEOUS
  Administered 2023-12-13: 1 [IU] via SUBCUTANEOUS
  Administered 2023-12-13 (×2): 2 [IU] via SUBCUTANEOUS
  Administered 2023-12-13: 1 [IU] via SUBCUTANEOUS
  Administered 2023-12-14: 2 [IU] via SUBCUTANEOUS
  Administered 2023-12-14: 1 [IU] via SUBCUTANEOUS
  Administered 2023-12-14: 2 [IU] via SUBCUTANEOUS
  Administered 2023-12-14: 1 [IU] via SUBCUTANEOUS
  Administered 2023-12-15 (×3): 2 [IU] via SUBCUTANEOUS
  Administered 2023-12-15 (×2): 1 [IU] via SUBCUTANEOUS
  Administered 2023-12-15 – 2023-12-16 (×2): 2 [IU] via SUBCUTANEOUS

## 2023-12-07 MED ORDER — NUTRISOURCE FIBER PO PACK
1.0000 | PACK | Freq: Two times a day (BID) | ORAL | Status: DC
  Administered 2023-12-07 – 2023-12-16 (×19): 1
  Filled 2023-12-07 (×20): qty 1

## 2023-12-07 MED ORDER — QUETIAPINE FUMARATE 25 MG PO TABS
25.0000 mg | ORAL_TABLET | Freq: Two times a day (BID) | ORAL | Status: DC
  Administered 2023-12-07: 25 mg
  Filled 2023-12-07: qty 1

## 2023-12-07 MED ORDER — INSULIN ASPART 100 UNIT/ML IJ SOLN: 3.0000 [IU] | Freq: Three times a day (TID) | INTRAMUSCULAR | Status: DC

## 2023-12-07 MED ORDER — POLYETHYLENE GLYCOL 3350 17 G PO PACK: 17.0000 g | PACK | Freq: Every day | ORAL | Status: DC | PRN

## 2023-12-07 MED ORDER — OLANZAPINE 5 MG PO TABS
5.0000 mg | ORAL_TABLET | Freq: Every day | ORAL | Status: DC
  Administered 2023-12-07: 5 mg via ORAL
  Filled 2023-12-07: qty 1

## 2023-12-07 MED ORDER — VITAL AF 1.2 CAL PO LIQD
1000.0000 mL | ORAL | Status: DC
  Administered 2023-12-07 – 2023-12-15 (×6): 1000 mL

## 2023-12-07 MED ORDER — INSULIN ASPART 100 UNIT/ML IJ SOLN
3.0000 [IU] | INTRAMUSCULAR | Status: DC
  Administered 2023-12-07 – 2023-12-10 (×15): 3 [IU] via SUBCUTANEOUS

## 2023-12-07 NOTE — Progress Notes (Signed)
 eLink Physician-Brief Progress Note Patient Name: Adael Culbreath DOB: 08-02-1990 MRN: [id number]   Date of Service  12/07/2023  HPI/Events of Note  Currently on sliding scale insulin  +3 units scheduled insulin  Had hypoglycemic event required treatment  eICU Interventions  Discontinue standing 3 units, maintain SSI     Intervention Category Intermediate Interventions: Hyperglycemia - evaluation and treatment  Evo Aderman 12/07/2023, 12:28 AM

## 2023-12-07 NOTE — Evaluation (Signed)
 Physical Therapy Re-Evaluation Patient Details Name: Cory Russell MRN: [id number] DOB: [birth year] Today's Date: 12/07/2023  History of Present Illness  Pt is a 33 yr old male who presented 11/08/23 with seizure-like activity. acute respiratory failure with hypoxia and hypercapnia in setting of severe bilateral multifocal pneumonia now status post trach 7/1. AIDs. PMH: HIV, seizure disorder, HTN, rectal bleeding, depression.  Clinical Impression  Re-evaluated. Required total assist with all mobility due to profound weakness. Able to participate with LE exercises, light muscle activation with verbal cues. Max assist to remain seated at EOB for several minutes. Patient will benefit from continued inpatient follow up therapy, <3 hours/day. Will update recs as pt progresses. Pt currently with functional limitations due to the deficits listed below (see PT Problem List). Pt will benefit from acute skilled PT to increase their independence and safety with mobility to allow discharge.           If plan is discharge home, recommend the following: Two people to help with walking and/or transfers;Two people to help with bathing/dressing/bathroom;Assistance with cooking/housework;Direct supervision/assist for medications management;Direct supervision/assist for financial management;Assist for transportation;Assistance with feeding;Help with stairs or ramp for entrance;Supervision due to cognitive status (PRN assist)   Can travel by private vehicle   No    Equipment Recommendations Hoyer lift;Hospital bed;Wheelchair cushion (measurements PT);Wheelchair (measurements PT)  Recommendations for Other Services       Functional Status Assessment Patient has had a recent decline in their functional status and demonstrates the ability to make significant improvements in function in a reasonable and predictable amount of time.     Precautions / Restrictions Precautions Precautions: Fall Recall of  Precautions/Restrictions: Impaired Precaution/Restrictions Comments: Mon O2 and HR. Chest tube Lt Restrictions Weight Bearing Restrictions Per Provider Order: No      Mobility  Bed Mobility Overal bed mobility: Needs Assistance Bed Mobility: Rolling, Sidelying to Sit, Sit to Sidelying Rolling: Total assist Sidelying to sit: Total assist     Sit to sidelying: Total assist General bed mobility comments: Total assist due to profound weakness, pt shows effort to facilitate with cues, a bit delayed trace muscle movement.    Transfers                        Ambulation/Gait                  Stairs            Wheelchair Mobility     Tilt Bed    Modified Rankin (Stroke Patients Only)       Balance Overall balance assessment: Needs assistance Sitting-balance support: Feet supported, Bilateral upper extremity supported Sitting balance-Leahy Scale: Zero Sitting balance - Comments: max assist to sit EOB,                                     Pertinent Vitals/Pain Pain Assessment Pain Assessment: CPOT Body Movements: Protection CPOT Total: 2 Pain Location: general Pain Descriptors / Indicators: Discomfort Pain Intervention(s): Monitored during session    Home Living Family/patient expects to be discharged to:: Private residence Living Arrangements: Parent Available Help at Discharge: Family Type of Home: House Home Access: Stairs to enter Entrance Stairs-Rails: Right;Left;Can reach both Secretary/administrator of Steps: 4   Home Layout: One level Home Equipment: None      Prior Function Prior Level of Function : Independent/Modified Independent  Extremity/Trunk Assessment   Upper Extremity Assessment Upper Extremity Assessment: Defer to OT evaluation    Lower Extremity Assessment Lower Extremity Assessment: Generalized weakness (profound)    Cervical / Trunk Assessment Cervical / Trunk  Assessment: Normal  Communication   Communication Communication: No apparent difficulties Factors Affecting Communication: Trach/intubated    Cognition Arousal: Alert Behavior During Therapy: WFL for tasks assessed/performed, Lability   PT - Cognitive impairments: No family/caregiver present to determine baseline, Difficult to assess Difficult to assess due to: Tracheostomy                     PT - Cognition Comments: Nods yes, no, appropriately but keeps trying to speak despite trach/vent Following commands: Impaired Following commands impaired: Follows one step commands with increased time, Follows one step commands inconsistently     Cueing Cueing Techniques: Verbal cues, Gestural cues, Tactile cues     General Comments General comments (skin integrity, edema, etc.): PEEP 8, 51% fio2 on vent through trach. HR 108-120s    Exercises General Exercises - Lower Extremity Ankle Circles/Pumps: AROM, AAROM, Both, 10 reps, Supine Quad Sets: Strengthening, Both, 10 reps, Supine Gluteal Sets: Strengthening, Both, 10 reps, Supine Heel Slides: PROM, Both, 10 reps, Supine   Assessment/Plan    PT Assessment Patient needs continued PT services  PT Problem List Decreased activity tolerance;Decreased mobility;Cardiopulmonary status limiting activity;Decreased strength;Decreased balance;Decreased coordination;Decreased cognition;Decreased knowledge of use of DME;Decreased safety awareness;Decreased knowledge of precautions;Impaired tone       PT Treatment Interventions Gait training;Stair training;Functional mobility training;Therapeutic activities;Balance training;Patient/family education;DME instruction;Therapeutic exercise;Neuromuscular re-education;Cognitive remediation;Wheelchair mobility training;Modalities;Manual techniques    PT Goals (Current goals can be found in the Care Plan section)  Acute Rehab PT Goals Patient Stated Goal: none stated PT Goal Formulation: Patient  unable to participate in goal setting Time For Goal Achievement: 12/21/23 Potential to Achieve Goals: Fair    Frequency Min 2X/week     Co-evaluation               AM-PAC PT 6 Clicks Mobility  Outcome Measure Help needed turning from your back to your side while in a flat bed without using bedrails?: Total Help needed moving from lying on your back to sitting on the side of a flat bed without using bedrails?: Total Help needed moving to and from a bed to a chair (including a wheelchair)?: Total Help needed standing up from a chair using your arms (e.g., wheelchair or bedside chair)?: Total Help needed to walk in hospital room?: Total Help needed climbing 3-5 steps with a railing? : Total 6 Click Score: 6    End of Session Equipment Utilized During Treatment: Oxygen Activity Tolerance: Patient limited by fatigue (Profound weakness) Patient left: in bed;with call bell/phone within reach;with bed alarm set;with nursing/sitter in room;with family/visitor present;with SCD's reapplied Nurse Communication: Mobility status PT Visit Diagnosis: Difficulty in walking, not elsewhere classified (R26.2);Other abnormalities of gait and mobility (R26.89);Muscle weakness (generalized) (M62.81);Other symptoms and signs involving the nervous system (R29.898)    Time: 8467-8443 PT Time Calculation (min) (ACUTE ONLY): 24 min   Charges:   PT Evaluation $PT Re-evaluation: 1 Re-eval PT Treatments $Therapeutic Activity: 8-22 mins PT General Charges $$ ACUTE PT VISIT: 1 Visit         Leontine Roads, PT, DPT [facility]  Rehabilitation Services Physical Therapist Office: [phone] Website: [email or web address]   Leontine GORMAN Roads 12/07/2023, 4:48 PM

## 2023-12-07 NOTE — Inpatient Diabetes Management (Signed)
 Inpatient Diabetes Program Recommendations  AACE/ADA: New Consensus Statement on Inpatient Glycemic Control (2015)  Target Ranges:  Prepandial:   less than 140 mg/dL      Peak postprandial:   less than 180 mg/dL (1-2 hours)      Critically ill patients:  140 - 180 mg/dL    Latest Reference Range & Units 12/06/23 00:08 12/06/23 04:21 12/06/23 07:40 12/06/23 11:02 12/06/23 16:13 12/06/23 18:00 12/06/23 20:25  Glucose-Capillary 70 - 99 mg/dL 743 (H)  11 units Novolog   246 (H)  7 units Novolog   219 (H)  7 units Novolog   248 (H)  10 units Novolog   191 (H)   196 (H)  7 units Novolog  135 (H)  6 units Novolog    (H): Data is abnormally high  Latest Reference Range & Units 12/06/23 23:21 12/07/23 00:01 12/07/23 03:29 12/07/23 08:03  Glucose-Capillary 70 - 99 mg/dL 68 (L) 843 (H) 784 (H)  7 units Novolog   294 (H)  11 units Novolog    (L): Data is abnormally low (H): Data is abnormally high     Current Orders: Novolog  Resistant Correction Scale/ SSI (0-20 units) Q4 hours    MD- Note pt getting Solumedrol 40 mg BID + Tube Feeds 65cc/hr  Novolog  3 units Q4 hours added yest at 12pm for coverage of the carbohydrates in the tube feeds  Note mild Hypoglycemic event at MN last PM--likely due to back to back doses of Novolog  being given too close together (6pm and then again at 8pm)  CBGs now again on the rise likely to to Tube Feeds and Steroids  Recommend the following:  1. Reduce the Novolog  SSI to the 0-9 unit Sensitive scale Q4 hours  2. Add back Novolog  3 units Q4 hours for TF coverage HOLD if tube feed HELD for any reason)     --Will follow patient during hospitalization--  Adina Rudolpho Arrow RN, MSN, CDCES Diabetes Coordinator Inpatient Glycemic Control Team Team Pager: [phone] (8a-5p)

## 2023-12-07 NOTE — Progress Notes (Signed)
 NAME:  Cory Russell, MRN:  [id number], DOB:  24-Jan-1991, LOS: 29 ADMISSION DATE:  11/08/2023, CONSULTATION DATE: 11/19/2023 REFERRING MD:  Jonel - TRH, CHIEF COMPLAINT:  Respiratory failure   History of Present Illness:  Cory Russell is a 33 y.o. man with past medical history of HIV/AIDS, CD4<35 on admission, not on ARVs. Presents with seizure like activity 11 days ago and found to have sepsis from pneumonia.  He notes symptoms started about 2-3 weeks ago with shortness of breath and dry cough. He had two separate rounds of antibiotics visiting other urgent care and ED visits. He has tried albuterol  nebulizer treatments from friends and initially found them helpful. CT Chest shows bilateral ground glass opacities concerning for pneumoncystis pneumonia. Since being here he has been Started on empiric antibiotics with bactrim  and steroids. Also resumed ARVs during this admission. Has been persistently hypoxemic on 10LNC. Pulmonary consulted to assist with evaluation and management and for bronchoscopy.   He feels persistently short of breath, worse with exertion. He feels like chest PT is helping him. Feeling more anxious. Also having difficulty taking pills.   Pertinent Medical History:  HIV, Seizure disorder  Significant Hospital Events: Including procedures, antibiotic start and stop dates in addition to other pertinent events   6/04 Admit 6/15 Pulmonary consult 6/18 Transient desats with worsening chest x-ray.  Prednisone  changed to IV Solu-Medrol  6/22 Worsening respiratory status requiring ICU transfer and intubation 6/23 Bronch for BAL per ID request. NO being weaned from 20 to 15. Overnight L PTX s/p chest tube. 6/25 Required transition to SIMV vent mode due to dyssynchrony. Sedation increased. Paralytic push given. CXR improved s/p diuresis. Required secondary L pigtail for persistent PTX.  6/26 Large bore chest tube placed, pigtail removed. 6/30 discussed plan for trach. Weaning  sedation  7/1 trach 7/2 weaning sedation. Chest tube inadvertently malfunctioned.   7/3 weaning sedation, vent   Interim History / Subjective:  Was sedated overnight.   Asking for water this morning   Objective:   Blood pressure 108/66, pulse (!) 103, temperature (!) 100.6 F (38.1 C), temperature source Oral, resp. rate (!) 22, height 5' 7 (1.702 m), weight 39.5 kg, SpO2 98%.    Vent Mode: PRVC;SIMV;PSV FiO2 (%):  [50 %] 50 % Set Rate:  [18 bmp] 18 bmp Vt Set:  [410 mL] 410 mL PEEP:  [8 cmH20] 8 cmH20 Pressure Support:  [8 cmH20] 8 cmH20 Plateau Pressure:  [15 cmH20-29 cmH20] 24 cmH20   Intake/Output Summary (Last 24 hours) at 12/07/2023 1056 Last data filed at 12/07/2023 1000 Gross per 24 hour  Intake 2119.74 ml  Output 3195 ml  Net -1075.26 ml   Filed Weights   12/05/23 0500 12/06/23 0435 12/07/23 0221  Weight: 42.1 kg 38.3 kg 39.5 kg   Physical Examination:  General: chronically and critically ill appearing M Neuro: Awake alert, following commands but is weak  HEENT: L trach suture dislodged. Trach midline. Anicteric sclera. Tacky mm  CV: s1s2 cap refill < 3 sec, tachycardic  PULM:  L chest tube with air leak  GI: soft thin ndnt  Extremities: symmetrically decr muscle mass BUE BLE  Assessment and Plan:    Acute encephalopathy, improving  Sz disorder  P -delirium precautions  -dc fent -adding seroquel and PRN atarax to facilitate coming off precedex  - hopefully dc precedex  7/3  -depakote    Acute resp failure w hypoxia / hypercarbia ARDS, improved Presumed PJP PNA Possible CMV pneumonitis  L ptx, pneumomediastinum - possible BPF S/p  trach (7/1) P -wean as tolerated, goal ATC -VAP, pulm hygiene -routine trach care + add'l suture placed 7/3 -cont chest tube to sxn -PRN CXR. Possible CT if not improving but not sure it would change much at this point so defer  -follow BAL  -had tx for PJP now on ppx + gancyclovir for possible CMV pneumonitis  -TOC  consult for possible LTAC   HIV/AIDS Probable IRIS  P -ID following: cont biktarvy , darunavir -cobibistat, gancyclovir, azithro, dapsone   -steroids -- last weaned 6/30. Likely wean further this weekend   Hyperglycemia -SSI + EN coverage  Anemia  -PRN CBC   Hypernatremia -cont FWF    Best Practice (right click and Reselect all SmartList Selections daily)   Diet/type: EN DVT prophylaxis prophylactic heparin   Pressure ulcer(s): N/A GI prophylaxis: H2B Lines: na Foley:  na Code Status:  full code Last date of multidisciplinary goals of care discussion: 7/2 updated mom at bedside    CRITICAL CARE Performed by: Ronnald FORBES Gave   Total critical care time: 38 minutes  Critical care time was exclusive of separately billable procedures and treating other patients. Critical care was necessary to treat or prevent imminent or life-threatening deterioration.  Critical care was time spent personally by me on the following activities: development of treatment plan with patient and/or surrogate as well as nursing, discussions with consultants, evaluation of patient's response to treatment, examination of patient, obtaining history from patient or surrogate, ordering and performing treatments and interventions, ordering and review of laboratory studies, ordering and review of radiographic studies, pulse oximetry and re-evaluation of patient's condition.  Ronnald Gave MSN, AGACNP-BC [place] [facility] Amion for pager 12/07/2023, 10:56 AM

## 2023-12-07 NOTE — Progress Notes (Signed)
 PT Cancellation Note  Patient Details Name: Cory Russell MRN: [id number] DOB: [birth year]   Cancelled Treatment:    Reason Eval/Treat Not Completed: Patient's level of consciousness  Opening eyes lightly to noxious stimulus only. Not following commands. RN notified. Will hold formal PT consult this morning. Check back this afternoon schedule permitting.  Leontine Roads, PT, DPT [facility]  Rehabilitation Services Physical Therapist Office: [phone] Website: [email or web address]    Leontine GORMAN Roads 12/07/2023, 10:40 AM

## 2023-12-08 ENCOUNTER — Inpatient Hospital Stay (HOSPITAL_COMMUNITY)

## 2023-12-08 DIAGNOSIS — J95851 Ventilator associated pneumonia: Secondary | ICD-10-CM | POA: Diagnosis not present

## 2023-12-08 DIAGNOSIS — Z515 Encounter for palliative care: Secondary | ICD-10-CM | POA: Diagnosis not present

## 2023-12-08 DIAGNOSIS — J9601 Acute respiratory failure with hypoxia: Secondary | ICD-10-CM | POA: Diagnosis not present

## 2023-12-08 DIAGNOSIS — B20 Human immunodeficiency virus [HIV] disease: Secondary | ICD-10-CM | POA: Diagnosis not present

## 2023-12-08 DIAGNOSIS — J939 Pneumothorax, unspecified: Secondary | ICD-10-CM | POA: Diagnosis not present

## 2023-12-08 DIAGNOSIS — G9340 Encephalopathy, unspecified: Secondary | ICD-10-CM | POA: Diagnosis not present

## 2023-12-08 DIAGNOSIS — D649 Anemia, unspecified: Secondary | ICD-10-CM

## 2023-12-08 DIAGNOSIS — B250 Cytomegaloviral pneumonitis: Secondary | ICD-10-CM | POA: Diagnosis not present

## 2023-12-08 DIAGNOSIS — J9602 Acute respiratory failure with hypercapnia: Secondary | ICD-10-CM | POA: Diagnosis not present

## 2023-12-08 DIAGNOSIS — Z7189 Other specified counseling: Secondary | ICD-10-CM | POA: Diagnosis not present

## 2023-12-08 LAB — BASIC METABOLIC PANEL WITH GFR
Anion gap: 7 (ref 5–15)
BUN: 43 mg/dL — ABNORMAL HIGH (ref 6–20)
CO2: 36 mmol/L — ABNORMAL HIGH (ref 22–32)
Calcium: 8.9 mg/dL (ref 8.9–10.3)
Chloride: 103 mmol/L (ref 98–111)
Creatinine, Ser: 0.73 mg/dL (ref 0.61–1.24)
GFR, Estimated: >60 mL/min (ref >60)
Glucose, Bld: 213 mg/dL — ABNORMAL HIGH (ref 70–99)
Potassium: 4.5 mmol/L (ref 3.5–5.1)
Sodium: 146 mmol/L — ABNORMAL HIGH (ref 135–145)

## 2023-12-08 LAB — GLUCOSE, CAPILLARY
Glucose-Capillary: 109 mg/dL — ABNORMAL HIGH (ref 70–99)
Glucose-Capillary: 115 mg/dL — ABNORMAL HIGH (ref 70–99)
Glucose-Capillary: 161 mg/dL — ABNORMAL HIGH (ref 70–99)
Glucose-Capillary: 180 mg/dL — ABNORMAL HIGH (ref 70–99)
Glucose-Capillary: 211 mg/dL — ABNORMAL HIGH (ref 70–99)
Glucose-Capillary: 221 mg/dL — ABNORMAL HIGH (ref 70–99)

## 2023-12-08 LAB — CBC
HCT: 23.5 % — ABNORMAL LOW (ref 39.0–52.0)
Hemoglobin: 7.5 g/dL — ABNORMAL LOW (ref 13.0–17.0)
MCH: 30.6 pg (ref 26.0–34.0)
MCHC: 31.9 g/dL (ref 30.0–36.0)
MCV: 95.9 fL (ref 80.0–100.0)
Platelets: 233 K/uL (ref 150–400)
RBC: 2.45 MIL/uL — ABNORMAL LOW (ref 4.22–5.81)
RDW: 17.8 % — ABNORMAL HIGH (ref 11.5–15.5)
WBC: 8.3 K/uL (ref 4.0–10.5)
nRBC: 0.7 % — ABNORMAL HIGH (ref 0.0–0.2)

## 2023-12-08 LAB — CYTOMEGALOVIRUS DNA, QUANTITATIVE REAL-TIME PCR, PLASMA
CMV DNA Quant: 80200 [IU]/mL
Log10 CMV Qn DNA Pl: 4.904 {Log_IU}/mL

## 2023-12-08 MED ORDER — OXYCODONE HCL 5 MG PO TABS
10.0000 mg | ORAL_TABLET | Freq: Four times a day (QID) | ORAL | Status: DC
  Administered 2023-12-08 – 2023-12-14 (×21): 10 mg
  Filled 2023-12-08 (×20): qty 2

## 2023-12-08 MED ORDER — METHYLPREDNISOLONE SODIUM SUCC 40 MG IJ SOLR
40.0000 mg | Freq: Every day | INTRAMUSCULAR | Status: DC
  Administered 2023-12-09: 40 mg via INTRAVENOUS
  Filled 2023-12-08 (×2): qty 1

## 2023-12-08 MED ORDER — OLANZAPINE 5 MG PO TABS
5.0000 mg | ORAL_TABLET | Freq: Every day | ORAL | Status: DC
  Administered 2023-12-08 – 2023-12-14 (×7): 5 mg
  Filled 2023-12-08 (×7): qty 1

## 2023-12-08 MED ORDER — ALUM & MAG HYDROXIDE-SIMETH 200-200-20 MG/5ML PO SUSP: 15.0000 mL | Freq: Three times a day (TID) | ORAL | Status: DC | PRN

## 2023-12-08 MED ORDER — MELATONIN 5 MG PO TABS
5.0000 mg | ORAL_TABLET | Freq: Every evening | ORAL | Status: DC | PRN
  Administered 2023-12-10 – 2023-12-14 (×3): 5 mg
  Filled 2023-12-08 (×4): qty 1

## 2023-12-08 NOTE — Progress Notes (Signed)
 NAME:  Cory Russell, MRN:  [id number], DOB:  [birth year], LOS: 30 ADMISSION DATE:  11/08/2023, CONSULTATION DATE: 11/19/2023 REFERRING MD:  Jonel - TRH, CHIEF COMPLAINT:  Respiratory failure   History of Present Illness:  Cory Russell is a 33 y.o. man with past medical history of HIV/AIDS, CD4<35 on admission, not on ARVs. Presents with seizure like activity 11 days ago and found to have sepsis from pneumonia.  He notes symptoms started about 2-3 weeks ago with shortness of breath and dry cough. He had two separate rounds of antibiotics visiting other urgent care and ED visits. He has tried albuterol  nebulizer treatments from friends and initially found them helpful. CT Chest shows bilateral ground glass opacities concerning for pneumoncystis pneumonia. Since being here he has been Started on empiric antibiotics with bactrim  and steroids. Also resumed ARVs during this admission. Has been persistently hypoxemic on 10LNC. Pulmonary consulted to assist with evaluation and management and for bronchoscopy.   He feels persistently short of breath, worse with exertion. He feels like chest PT is helping him. Feeling more anxious. Also having difficulty taking pills.   Pertinent Medical History:  HIV, Seizure disorder  Significant Hospital Events: Including procedures, antibiotic start and stop dates in addition to other pertinent events   6/04 Admit 6/15 Pulmonary consult 6/18 Transient desats with worsening chest x-ray.  Prednisone  changed to IV Solu-Medrol  6/22 Worsening respiratory status requiring ICU transfer and intubation 6/23 Bronch for BAL per ID request. NO being weaned from 20 to 15. Overnight L PTX s/p chest tube. 6/25 Required transition to SIMV vent mode due to dyssynchrony. Sedation increased. Paralytic push given. CXR improved s/p diuresis. Required secondary L pigtail for persistent PTX.  6/26 Large bore chest tube placed, pigtail removed. 6/30 discussed plan for trach. Weaning  sedation  7/1 trach 7/2 weaning sedation. Chest tube inadvertently malfunctioned.   7/3 weaning sedation, vent   Interim History / Subjective:  Started spiking fever Tmax 101.5 White count remain normal No overnight issues  stool output is decreasing  Objective:   Blood pressure 132/74, pulse (!) 142, temperature (!) 101.5 F (38.6 C), temperature source Axillary, resp. rate (!) 35, height 5' 7 (1.702 m), weight 36.2 kg, SpO2 96%.    Vent Mode: SIMV;PSV;PRVC FiO2 (%):  [50 %] 50 % Set Rate:  [18 bmp] 18 bmp Vt Set:  [410 mL] 410 mL PEEP:  [8 cmH20] 8 cmH20 Pressure Support:  [8 cmH20] 8 cmH20 Plateau Pressure:  [19 cmH20-27 cmH20] 19 cmH20   Intake/Output Summary (Last 24 hours) at 12/08/2023 1106 Last data filed at 12/08/2023 0800 Gross per 24 hour  Intake 1518.73 ml  Output 2240 ml  Net -721.27 ml   Filed Weights   12/06/23 0435 12/07/23 0221 12/08/23 0300  Weight: 38.3 kg 39.5 kg 36.2 kg   Physical Examination: General: Acute on chronically ill-appearing young cachectic male, lying on the bed.  Status post trach HEENT: [place]/AT, eyes anicteric.  moist mucus membranes, subcu emphysema noted in bilateral neck.  Cortrak in place Neuro: Alert, awake following commands, generalized weak Chest: Tachypneic, coarse breath sounds, no wheezes or rhonchi. L sided chest tube in place, still with air leak Heart: Tachycardic, regular rhythm, no murmurs or gallops Abdomen: Soft, nontender, nondistended, bowel sounds present  Labs and images reviewed  Patient Lines/Drains/Airways Status     Active Line/Drains/Airways     Name Placement date Placement time Site Days   Peripheral IV 11/19/23 20 G Anterior;Right Forearm 11/19/23  1338  Forearm  19   Peripheral IV 11/26/23 20 G Left Forearm 11/26/23  0105  Forearm  12   Chest Tube 1 Lateral;Left Pleural 32 Fr. 11/30/23  1130  Pleural  8   External Urinary Catheter 12/06/23  1430  --  2   Fecal Management System 45 mL 12/07/23  1835  --  1   Small Bore Feeding Tube 10 Fr. Left nare Marking at nare/corner of mouth 73 cm 11/27/23  1210  Left nare  11   Tracheostomy Shiley Flexible 6 mm Cuffed 12/05/23  1149  6 mm  3         Resolved Hospital problems  Probable severe PJP pneumonia Sepsis due to bilateral superimposed bacterial pneumonia with ARDS Acute septic encephalopathy  Assessment and Plan:  Acute respiratory failure with hypoxia and hypercapnia status post trach Left-sided pneumothorax and pneumomediastinum with subcutaneous air status post chest tube HIV AIDS Probable IRIS CMV viremia/pneumonitis Cachexia/severe protein calorie malnutrition Seizure disorder Anemia of critical illness  Continue lung protective ventilation VAP prevention bundle in place Gets tachypneic with spontaneous breathing trial He is still on Precedex , dose decreased to 0.2 mics per KG Will try to stop Precedex  Continue Klonopin  per tube Keep chest tube on suction, 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  Critical care was necessary to treat or prevent imminent or life-threatening deterioration.  Critical care was time spent personally by me on the following activities: development of treatment plan with patient and/or surrogate as well as nursing, discussions with consultants, evaluation of patient's response to treatment, examination of patient, obtaining history from patient or  surrogate, ordering and performing treatments and interventions, ordering and review of laboratory studies, ordering and review of radiographic studies, pulse oximetry, re-evaluation of patient's condition and participation in multidisciplinary rounds.   During this encounter critical care time was devoted to patient care services described in this note for 33 minutes.   Valinda Novas, MD [place] [facility] See Amion for pager If no response to pager, please call [phone] until 7pm After 7pm, Please call E-link [phone]

## 2023-12-08 NOTE — Progress Notes (Signed)
 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  Status post tracheostomy.  Cory Russell is slightly more alert today and continues to receive antibiotics with no adverse side effects.  Febrile overnight and has had low-grade fevers with no clear source of infection and question maybe IRIS although viral load suppressed as of 11/29/23 at 50.  CD4 count remains <35.  Check AFB cultures and remains on azithromycin  for PCP prophylaxis weekly.  Will continue current ART with Biktarvy  and OI prophylaxis with sulfamethoxazole -trimethoprim .  Continue current dose of ganciclovir  for CMV.  Respiratory status appears to be improving on pressure support and more alert. Remaining medical and supportive care per PCCM.  PLAN:  Continue current dose of Biktarvy  for ART and sulfamethoxazole -trimethoprim  for OI prophylaxis/secondary prevention of PJP. Continue ganciclovir  for CMV infection. Check AFB blood cultures and continue azithromycin  for MAC prophylaxis Remaining medical and supportive care per PCCM.  Dr. Eben will be available over the weekend as needed for any ID related questions.  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)   Pneumonia  due to Pneumocystis jirovecii (HCC)   Hypoxia   Healthcare associated bacterial pneumonia   Pneumothorax   IRIS (immune reconstitution inflammatory syndrome) (HCC)   Cytomegalovirus (CMV) viremia (HCC)   Fever    arformoterol   15 mcg Nebulization BID   azithromycin   1,200 mg Per Tube Weekly   bictegravir-emtricitabine -tenofovir  AF  1 tablet Oral Daily   Chlorhexidine  Gluconate Cloth  6 each Topical Daily   clonazepam   1 mg Per Tube TID   dapsone   100 mg Per Tube Daily   darunavir -cobicistat   1 tablet Oral Q breakfast   famotidine   20 mg Per Tube BID   fiber  1 packet Per Tube BID   free water   200 mL Per Tube Q4H   furosemide   40 mg Intravenous Q12H   heparin  injection (subcutaneous)  5,000 Units Subcutaneous Q8H   insulin  aspart  0-9 Units Subcutaneous Q4H   insulin  aspart  3 Units Subcutaneous Q4H   [START ON 12/09/2023] methylPREDNISolone  (SOLU-MEDROL ) injection  40 mg Intravenous Daily   metoprolol  tartrate  25 mg Per Tube Q8H   multivitamin with minerals  1 tablet Per Tube Daily   OLANZapine   5 mg Per Tube QHS   mouth rinse  15 mL Mouth Rinse Q2H   oxyCODONE   10 mg Per Tube Q6H   revefenacin   175 mcg Nebulization Daily   thiamine   100 mg Per Tube Daily   valproic  acid  500 mg Per Tube BID    SUBJECTIVE:  Febrile overnight with temperature of 101.5 F with white blood cell count 8300.  Mother at bedside.  Opens eyes to voice.  Currently on pressure support  No Known Allergies   Review of Systems: Review of Systems  Unable to perform ROS: Critical illness      OBJECTIVE: Vitals:   12/08/23 0930 12/08/23 1000 12/08/23 1030 12/08/23 1100  BP: 122/77 132/74 106/62 (!) 100/57  Pulse: (!) 130 (!) 142 (!) 127 (!) 121  Resp: (!) 31 (!) 35 (!) 30 (!) 30  Temp:      TempSrc:      SpO2: 96% 96% 93% 93%  Weight:      Height:       Body mass index is 12.5 kg/m.  Physical Exam Constitutional:      General: Cory Russell is not in acute distress.    Appearance: Cory Russell is  well-developed. Cory Russell is ill-appearing and diaphoretic.  Cardiovascular:     Rate and Rhythm: Normal rate and regular rhythm.     Heart sounds: Normal heart sounds.  Pulmonary:     Effort: Pulmonary effort is normal.     Breath sounds: Normal breath sounds.  Skin:    General: Skin is warm.  Neurological:     Mental Status: Cory Russell is oriented to person, place, and time.     Lab Results Lab Results  Component Value Date   WBC 8.3 12/08/2023   HGB 7.5 (L) 12/08/2023   HCT 23.5 (L) 12/08/2023   MCV 95.9 12/08/2023   PLT 233 12/08/2023    Lab Results  Component Value Date   CREATININE 0.73 12/08/2023   BUN 43 (H) 12/08/2023   NA 146 (H) 12/08/2023   K 4.5 12/08/2023   CL 103 12/08/2023   CO2 36 (H) 12/08/2023    Lab Results  Component Value Date   ALT 15 11/18/2023   AST 25 11/18/2023   ALKPHOS 57 11/18/2023   BILITOT 0.4 11/18/2023     Microbiology: Recent Results (from the past 240 hours)  Culture, blood (Routine X 2) w Reflex to ID Panel     Status: None   Collection Time: 12/01/23 12:43 PM   Specimen: BLOOD  Result Value Ref Range Status   Specimen Description BLOOD SITE NOT SPECIFIED  Final   Special Requests   Final    BOTTLES DRAWN AEROBIC AND ANAEROBIC Blood Culture adequate volume   Culture   Final    NO GROWTH 5 DAYS Performed at [facility], [address] [place], [place] [address]    Report Status 12/06/2023 FINAL  Final  Culture, blood (Routine X 2) w Reflex to ID Panel     Status: None   Collection Time: 12/01/23 12:44 PM   Specimen: BLOOD  Result Value Ref Range Status   Specimen Description BLOOD SITE NOT SPECIFIED  Final   Special Requests   Final    BOTTLES DRAWN AEROBIC AND ANAEROBIC Blood Culture adequate volume   Culture   Final    NO GROWTH 5 DAYS Performed at [facility], [address] [place], [place] [address]    Report Status 12/06/2023 FINAL  Final  Culture, Respiratory w Gram Stain     Status: None   Collection Time:  12/05/23 11:40 AM   Specimen: Bronchoalveolar Lavage; Respiratory  Result Value Ref Range Status   Specimen Description BRONCHIAL ALVEOLAR LAVAGE  Final   Special Requests NONE  Final   Gram Stain   Final    RARE WBC PRESENT, PREDOMINANTLY MONONUCLEAR NO ORGANISMS SEEN Performed at [facility], [address], [place], [place] [address]    Culture RARE CANDIDA  ALBICANS  Final   Report Status 12/07/2023 FINAL  Final  Acid Fast Smear (AFB)     Status: None   Collection Time: 12/05/23 11:40 AM   Specimen: Bronchoalveolar Lavage; Respiratory  Result Value Ref Range Status   AFB Specimen Processing Concentration  Final   Acid Fast Smear Negative  Final    Comment: (NOTE) Performed At: [facility] [address] [place], [place] [id number] Jennette Shorter MD Ey:[id number]    Source (AFB) BRONCHIAL ALVEOLAR LAVAGE  Final    Comment: Performed at [facility], [address], [place], [place] [address]    I have personally spent 30 minutes involved in face-to-face and non-face-to-face activities for this patient on the day of the visit. Professional time spent includes the following activities: preparing to see the patient (review of tests), performing a medically appropriate examination, ordering medications, communicating with other health care professionals, documenting clinical information in the EMR, communicating results and counseling patient and family regarding medication and plan of care, and care coordination.    Greg Dyonna Jaspers, NP [facility]  12/08/2023  11:46 AM

## 2023-12-08 NOTE — Progress Notes (Incomplete)
 Palliative Medicine Progress Note   Patient Name: Cory Russell       Date: 12/08/2023 DOB: [birth year]  Age: 33 y.o. MRN#: [id number] Attending Physician: Harold Scholz, MD Primary Care Physician: Luiz Channel, MD Admit Date: 11/08/2023  Reason for Consultation/Follow-up: {Reason for Consult:23484}  HPI/Patient Profile: 33 y.o. male  with past medical history of HIV/AIDS, CD4<35 on admission, not on ARVs who presented to the ED with seizure activity on 11/08/2023 and is admitted with acute respiratory failure with ARDS and severe pneumocystis pneumonia.    Palliative Medicine has been consulted for goals of care discussions. Patient and family are faced with anticipatory care needs and complex medical decision making.   Subjective: Chart reviewed including labs, progress notes, and imaging. Status post trach on 7/1.  Sedation has been weaned down.   Patient assessed. He is awake, follows commands, and can lip speak (currently asking for water ).  Mother at bedside. Reviewed patient's current clinical status and plan of care.   Objective:  Physical Exam Vitals reviewed.  Constitutional:      General: He is awake. He is not in acute distress.    Appearance: He is ill-appearing.  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)   Pneumonia due to Pneumocystis jirovecii (HCC)   Hypoxia   Healthcare associated bacterial pneumonia   Pneumothorax   IRIS (immune reconstitution inflammatory syndrome) (HCC)   Cytomegalovirus (CMV) viremia (HCC)   Fever    Recommendations/Plan: ***  Goals of Care and Additional Recommendations: Limitations on Scope of Treatment: {Recommended Scope and Preferences:21019}  Code Status:   Prognosis:  {Palliative Care Prognosis:23504}  Discharge Planning: {Palliative dispostion:23505}  Care plan  was discussed with ***  Thank you for allowing the Palliative Medicine Team to assist in the care of this patient.   ***   Cory KATHEE Loll, NP   Please contact Palliative Medicine Team phone at [phone] for questions and concerns.  For individual providers, please see AMION.

## 2023-12-09 DIAGNOSIS — J9602 Acute respiratory failure with hypercapnia: Secondary | ICD-10-CM | POA: Diagnosis not present

## 2023-12-09 DIAGNOSIS — G9340 Encephalopathy, unspecified: Secondary | ICD-10-CM | POA: Diagnosis not present

## 2023-12-09 DIAGNOSIS — B20 Human immunodeficiency virus [HIV] disease: Secondary | ICD-10-CM | POA: Diagnosis not present

## 2023-12-09 DIAGNOSIS — J9601 Acute respiratory failure with hypoxia: Secondary | ICD-10-CM | POA: Diagnosis not present

## 2023-12-09 DIAGNOSIS — B250 Cytomegaloviral pneumonitis: Secondary | ICD-10-CM | POA: Diagnosis not present

## 2023-12-09 LAB — GLUCOSE, CAPILLARY
Glucose-Capillary: 173 mg/dL — ABNORMAL HIGH (ref 70–99)
Glucose-Capillary: 161 mg/dL — ABNORMAL HIGH (ref 70–99)
Glucose-Capillary: 120 mg/dL — ABNORMAL HIGH (ref 70–99)
Glucose-Capillary: 214 mg/dL — ABNORMAL HIGH (ref 70–99)
Glucose-Capillary: 78 mg/dL (ref 70–99)
Glucose-Capillary: 128 mg/dL — ABNORMAL HIGH (ref 70–99)

## 2023-12-09 NOTE — Progress Notes (Signed)
 eLink Physician-Brief Progress Note Patient Name: Cory Russell DOB: 1990-11-20 MRN: [id number]   Date of Service  12/09/2023  HPI/Events of Note  Diarrhea.  RN requesting Flexi-Seal.  eICU Interventions  Order placed.     Intervention Category Minor Interventions: Routine modifications to care plan (e.g. PRN medications for pain, fever)  Jerilynn Berg 12/09/2023, 10:23 PM

## 2023-12-09 NOTE — Progress Notes (Signed)
 NAME:  Cory Russell, MRN:  [id number], DOB:  1990-08-26, LOS: 31 ADMISSION DATE:  11/08/2023, CONSULTATION DATE: 11/19/2023 REFERRING MD:  Cory Russell - TRH, CHIEF COMPLAINT:  Respiratory failure   History of Present Illness:  Cory Russell is a 33 y.o. man with past medical history of HIV/AIDS, CD4<35 on admission, not on ARVs. Presents with seizure like activity 11 days ago and found to have sepsis from pneumonia.  He notes symptoms started about 2-3 weeks ago with shortness of breath and dry cough. He had two separate rounds of antibiotics visiting other urgent care and ED visits. He has tried albuterol  nebulizer treatments from friends and initially found them helpful. CT Chest shows bilateral ground glass opacities concerning for pneumoncystis pneumonia. Since being here he has been Started on empiric antibiotics with bactrim  and steroids. Also resumed ARVs during this admission. Has been persistently hypoxemic on 10LNC. Pulmonary consulted to assist with evaluation and management and for bronchoscopy.   He feels persistently short of breath, worse with exertion. He feels like chest PT is helping him. Feeling more anxious. Also having difficulty taking pills.   Pertinent Medical History:  HIV, Seizure disorder  Significant Hospital Events: Including procedures, antibiotic start and stop dates in addition to other pertinent events   6/04 Admit 6/15 Pulmonary consult 6/18 Transient desats with worsening chest x-ray.  Prednisone  changed to IV Solu-Medrol  6/22 Worsening respiratory status requiring ICU transfer and intubation 6/23 Bronch for BAL per ID request. NO being weaned from 20 to 15. Overnight L PTX s/p chest tube. 6/25 Required transition to SIMV vent mode due to dyssynchrony. Sedation increased. Paralytic push given. CXR improved s/p diuresis. Required secondary L pigtail for persistent PTX.  6/26 Large bore chest tube placed, pigtail removed. 6/30 discussed plan for trach. Weaning  sedation  7/1 trach 7/2 weaning sedation. Chest tube inadvertently malfunctioned.   7/3 weaning sedation, vent  7/4 CMV viral load came back higher than before, question of resistance.  Ganciclovir  stopped by ID.  Gets tachypneic with dysfunctional breathing trial  Interim History / Subjective:  Patient spiked fever with Tmax 102.3, remained tachycardic in 120s White count remain normal Tolerating spontaneous breathing trial this  Objective:   Blood pressure (!) 95/58, pulse (!) 138, temperature (!) 102.3 F (39.1 C), temperature source Oral, resp. rate (!) 32, height 5' 7 (1.702 m), weight 37.3 kg, SpO2 92%.    Vent Mode: SIMV;PSV;PRVC FiO2 (%):  [50 %] 50 % Set Rate:  [18 bmp] 18 bmp Vt Set:  [410 mL] 410 mL PEEP:  [8 cmH20] 8 cmH20 Pressure Support:  [8 cmH20] 8 cmH20 Plateau Pressure:  [30 cmH20-32 cmH20] 32 cmH20   Intake/Output Summary (Last 24 hours) at 12/09/2023 1141 Last data filed at 12/09/2023 9173 Gross per 24 hour  Intake 977.22 ml  Output 1530 ml  Net -552.78 ml   Filed Weights   12/07/23 0221 12/08/23 0300 12/09/23 0500  Weight: 39.5 kg 36.2 kg 37.3 kg   Physical Examination: General: Acute on chronically ill-appearing cachectic male, lying on the bed, s/p trach HEENT: [place]/AT, eyes anicteric.  moist mucus membranes.  Cortrak in place.  Subcutaneous emphysema noted in both sides of neck Neuro: Opens eyes with vocal stimuli, intermittently following simple commands, generalized weak Chest: Tachypneic, coarse breath sounds, no wheezes or rhonchi.  Left-sided chest tube in place with airleak Heart: Tachycardic, regular rhythm, no murmurs or gallops Abdomen: Soft, nontender, nondistended, bowel sounds present   Labs and images reviewed  Patient Lines/Drains/Airways Status  Active Line/Drains/Airways     Name Placement date Placement time Site Days   Peripheral IV 11/19/23 20 G Anterior;Right Forearm 11/19/23  1338  Forearm  19   Peripheral IV 11/26/23 20  G Left Forearm 11/26/23  0105  Forearm  12   Chest Tube 1 Lateral;Left Pleural 32 Fr. 11/30/23  1130  Pleural  8   External Urinary Catheter 12/06/23  1430  --  2   Fecal Management System 45 mL 12/07/23  1835  -- 1   Small Bore Feeding Tube 10 Fr. Left nare Marking at nare/corner of mouth 73 cm 11/27/23  1210  Left nare  11   Tracheostomy Shiley Flexible 6 mm Cuffed 12/05/23  1149  6 mm  3         Resolved Hospital problems  Probable severe PJP pneumonia Sepsis due to bilateral superimposed bacterial pneumonia with ARDS Acute septic encephalopathy  Assessment and Plan:  Acute respiratory failure with hypoxia and hypercapnia status post trach Left-sided pneumothorax and pneumomediastinum with subcutaneous air status post chest tube HIV AIDS Probable IRIS CMV viremia/pneumonitis Cachexia/severe protein calorie malnutrition Seizure disorder Anemia of critical illness  Continue lung protective ventilation VAP prevention bundle in place Tolerating spontaneous breathing trial but gets tachypneic He will need long-term acute care to come off of ventilator TOC consulted Left-sided remain in place, patient has persistent air leak, likely due to BP fistula Off Precedex  Started spiking fever now with Tmax of 102.3 Remain off antibiotics ID is following Continue Klonopin  Ganciclovir  was stopped by ID yesterday as CMV viral count has increased from prior The possibility of resistance to ganciclovir , PCR was sent to rule out resistance Continue ART Continue dapsone  and azithromycin  Continue Solu-Medrol  for 3 more days before stopping Continue Depakote  Monitor H&H Continue tube feeds   Best Practice (right click and Reselect all SmartList Selections daily)   Diet/type: TF DVT prophylaxis prophylactic heparin   Pressure ulcer(s): N/A GI prophylaxis: H2B Lines: na Foley:  na Code Status:  full code Last date of multidisciplinary goals of care discussion: 7/4 updated mom at  bedside    The patient is critically ill due to Acute respiratory failure.  Critical care was necessary to treat or prevent imminent or life-threatening deterioration.  Critical care was time spent personally by me on the following activities: development of treatment plan with patient and/or surrogate as well as nursing, discussions with consultants, evaluation of patient's response to treatment, examination of patient, obtaining history from patient or surrogate, ordering and performing treatments and interventions, ordering and review of laboratory studies, ordering and review of radiographic studies, pulse oximetry, re-evaluation of patient's condition and participation in multidisciplinary rounds.   During this encounter critical care time was devoted to patient care services described in this note for 32 minutes.   Valinda Novas, MD [facility] See Amion for pager If no response to pager, please call [phone] until 7pm After 7pm, Please call E-link [phone]

## 2023-12-09 NOTE — Progress Notes (Addendum)
 [facility]    Date of Admission:  11/08/2023   Total days of antibiotics: biktarvy  day 31 (azithro/bactrim  for px)   ID: Dalton Molesworth is a 33 y.o. 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  Mom feels he is better, sleeping this am.   Medications:   arformoterol   15 mcg Nebulization BID   azithromycin   1,200 mg Per Tube Weekly   bictegravir-emtricitabine -tenofovir  AF  1 tablet Oral Daily   Chlorhexidine  Gluconate Cloth  6 each Topical Daily   clonazepam   1 mg Per Tube TID   dapsone   100 mg Per Tube Daily   darunavir -cobicistat   1 tablet Oral Q breakfast   famotidine   20 mg Per Tube BID   fiber  1 packet Per Tube BID   free water   200 mL Per Tube Q4H   furosemide   40 mg Intravenous Q12H   heparin  injection (subcutaneous)  5,000 Units Subcutaneous Q8H   insulin  aspart  0-9 Units Subcutaneous Q4H   insulin  aspart  3 Units Subcutaneous Q4H   methylPREDNISolone  (SOLU-MEDROL ) injection  40 mg Intravenous Daily   metoprolol  tartrate  25 mg Per Tube Q8H   multivitamin with minerals  1 tablet Per Tube Daily   OLANZapine   5 mg Per Tube QHS   mouth rinse  15 mL Mouth Rinse Q2H   oxyCODONE   10 mg Per Tube Q6H   revefenacin   175 mcg Nebulization Daily   thiamine   100 mg Per Tube Daily   valproic  acid  500 mg Per Tube BID     Objective: Vital signs in last 24 hours: Temp:  [98.7 F (37.1 C)-102.2 F (39 C)] 100.8 F (38.2 C) (07/05 0753) Pulse Rate:  [114-163] 149 (07/05 0906) Resp:  [21-38] 34 (07/05 0906) BP: (99-141)/(57-101) 116/77 (07/05 0600) SpO2:  [91 %-96 %] 93 % (07/05 0906) FiO2 (%):  [50 %] 50 % (07/05 0906) Weight:  [37.3 kg] 37.3 kg (07/05 0500)   General appearance: fatigued and no distress Resp: clear to auscultation bilaterally Cardio: regularly irregular rhythm GI: normal findings: bowel sounds normal and soft, non-tender Extremities: edema none  Lab Results Recent Labs    12/07/23 0418 12/08/23 0506  WBC 6.8 8.3  HGB 7.6* 7.5*  HCT 24.3* 23.5*  NA 147* 146*  K 4.2 4.5  CL 102 103  CO2 38* 36*  BUN 54* 43*  CREATININE 0.82 0.73   HIV 1 RNA Quant (copies/mL)  Date Value  11/29/2023 50  11/18/2023 640  11/08/2023 306,000   CD4 T Cell Abs (/uL)  Date Value  11/29/2023 <35 (L)  11/08/2023 <35 (L)  04/12/2023 124 (L)     Liver Panel No results for input(s): PROT, ALBUMIN, AST, ALT, ALKPHOS, BILITOT, BILIDIR, IBILI in the last 72 hours. Sedimentation Rate No results for input(s): ESRSEDRATE in the last  72 hours. C-Reactive Protein No results for input(s): CRP in the last 72 hours.  Microbiology:  Studies/Results: DG CHEST PORT 1 VIEW Result Date: 12/08/2023 CLINICAL DATA:  Chest tube EXAM: PORTABLE CHEST 1 VIEW COMPARISON:  12/06/2023 FINDINGS: Tracheostomy tube again noted. Left chest tube remains in place with subcutaneous gas in the supraclavicular region bilaterally and associated pneumomediastinum. Possible trace left apical pneumothorax versus mediastinal gas. Patchy hazy airspace disease overlies both upper lungs, similar to prior. No substantial pleural effusion. A feeding tube passes into the stomach although the distal tip position is not included on the film. Telemetry leads overlie the chest. IMPRESSION: 1. Left chest tube remains  in place with associated pneumomediastinum and subcutaneous gas in the supraclavicular region bilaterally. Possible trace left apical pneumothorax versus mediastinal gas. 2. Patchy hazy airspace disease overlies both upper lungs, similar to prior. Electronically Signed   By: Camellia Candle M.D.   On: 12/08/2023 07:47     Assessment/Plan: AIDS CMV viremia Pneumonia Anemia  Continue to hold his gancyclovir Watch his clinical status (which is tenuous) he has temp 100.8 in last 12 h and is tachycardic, low BP.  Discussed with mom Palliative care note noted.  AFB BCx pending.  Appreciate CCM.  Trend h/h Cr stable.  HIV RNA has improved however his CD4 remains low. Continue biktarvy   [facility] Pager: [phone]  12/09/2023, 10:27 AM

## 2023-12-10 DIAGNOSIS — B20 Human immunodeficiency virus [HIV] disease: Secondary | ICD-10-CM | POA: Diagnosis not present

## 2023-12-10 DIAGNOSIS — J9602 Acute respiratory failure with hypercapnia: Secondary | ICD-10-CM | POA: Diagnosis not present

## 2023-12-10 DIAGNOSIS — J9601 Acute respiratory failure with hypoxia: Secondary | ICD-10-CM | POA: Diagnosis not present

## 2023-12-10 DIAGNOSIS — A4101 Sepsis due to Methicillin susceptible Staphylococcus aureus: Secondary | ICD-10-CM

## 2023-12-10 DIAGNOSIS — G9340 Encephalopathy, unspecified: Secondary | ICD-10-CM | POA: Diagnosis not present

## 2023-12-10 LAB — BASIC METABOLIC PANEL WITH GFR
Anion gap: 13 (ref 5–15)
BUN: 64 mg/dL — ABNORMAL HIGH (ref 6–20)
CO2: 33 mmol/L — ABNORMAL HIGH (ref 22–32)
Calcium: 9 mg/dL (ref 8.9–10.3)
Chloride: 96 mmol/L — ABNORMAL LOW (ref 98–111)
Creatinine, Ser: 1.05 mg/dL (ref 0.61–1.24)
GFR, Estimated: >60 mL/min (ref >60)
Glucose, Bld: 190 mg/dL — ABNORMAL HIGH (ref 70–99)
Potassium: 6 mmol/L — ABNORMAL HIGH (ref 3.5–5.1)
Sodium: 142 mmol/L (ref 135–145)

## 2023-12-10 LAB — GLUCOSE, CAPILLARY
Glucose-Capillary: 226 mg/dL — ABNORMAL HIGH (ref 70–99)
Glucose-Capillary: 327 mg/dL — ABNORMAL HIGH (ref 70–99)
Glucose-Capillary: 211 mg/dL — ABNORMAL HIGH (ref 70–99)
Glucose-Capillary: 189 mg/dL — ABNORMAL HIGH (ref 70–99)
Glucose-Capillary: 120 mg/dL — ABNORMAL HIGH (ref 70–99)
Glucose-Capillary: 69 mg/dL — ABNORMAL LOW (ref 70–99)
Glucose-Capillary: 76 mg/dL (ref 70–99)
Glucose-Capillary: 151 mg/dL — ABNORMAL HIGH (ref 70–99)

## 2023-12-10 LAB — CBC
HCT: 22.8 % — ABNORMAL LOW (ref 39.0–52.0)
Hemoglobin: 7.5 g/dL — ABNORMAL LOW (ref 13.0–17.0)
MCH: 30.9 pg (ref 26.0–34.0)
MCHC: 32.9 g/dL (ref 30.0–36.0)
MCV: 93.8 fL (ref 80.0–100.0)
Platelets: 230 K/uL (ref 150–400)
RBC: 2.43 MIL/uL — ABNORMAL LOW (ref 4.22–5.81)
RDW: 19.1 % — ABNORMAL HIGH (ref 11.5–15.5)
WBC: 8.1 K/uL (ref 4.0–10.5)
nRBC: 0 % (ref 0.0–0.2)

## 2023-12-10 MED ORDER — VANCOMYCIN HCL 750 MG/150ML IV SOLN
750.0000 mg | Freq: Once | INTRAVENOUS | Status: AC
  Administered 2023-12-10: 750 mg via INTRAVENOUS
  Filled 2023-12-10: qty 150

## 2023-12-10 MED ORDER — VANCOMYCIN HCL 500 MG/100ML IV SOLN
500.0000 mg | INTRAVENOUS | Status: DC
  Filled 2023-12-10: qty 100

## 2023-12-10 MED ORDER — INSULIN ASPART 100 UNIT/ML IV SOLN
5.0000 [IU] | Freq: Once | INTRAVENOUS | Status: AC
  Administered 2023-12-10: 5 [IU] via INTRAVENOUS

## 2023-12-10 MED ORDER — FREE WATER
100.0000 mL | Status: DC
  Administered 2023-12-10 – 2023-12-11 (×5): 100 mL

## 2023-12-10 MED ORDER — INSULIN ASPART 100 UNIT/ML IJ SOLN
5.0000 [IU] | INTRAMUSCULAR | Status: DC
  Administered 2023-12-10 – 2023-12-11 (×3): 5 [IU] via SUBCUTANEOUS

## 2023-12-10 MED ORDER — ALBUTEROL SULFATE (2.5 MG/3ML) 0.083% IN NEBU
10.0000 mg | INHALATION_SOLUTION | Freq: Once | RESPIRATORY_TRACT | Status: AC
  Administered 2023-12-10: 10 mg via RESPIRATORY_TRACT
  Filled 2023-12-10: qty 12

## 2023-12-10 MED ORDER — SODIUM ZIRCONIUM CYCLOSILICATE 10 G PO PACK
10.0000 g | PACK | Freq: Once | ORAL | Status: AC
  Administered 2023-12-10: 10 g
  Filled 2023-12-10: qty 1

## 2023-12-10 MED ORDER — SODIUM ZIRCONIUM CYCLOSILICATE 10 G PO PACK
10.0000 g | PACK | Freq: Once | ORAL | Status: AC
  Administered 2023-12-10: 10 g via ORAL
  Filled 2023-12-10: qty 1

## 2023-12-10 MED ORDER — DEXTROSE 50 % IV SOLN
1.0000 | Freq: Once | INTRAVENOUS | Status: AC
  Administered 2023-12-10: 50 mL via INTRAVENOUS
  Filled 2023-12-10: qty 50

## 2023-12-10 NOTE — Progress Notes (Signed)
 Placed back on full support at this time due to increased WOB and aggitation

## 2023-12-10 NOTE — Progress Notes (Addendum)
 Pharmacy Antibiotic Note  Cory Russell is a 33 y.o. male admitted on 11/08/2023 with pneumonia.  Pharmacy has been consulted for vancomycin  and cefepime  dosing.  WBC remains WNL at 8.1, Tmax/24 hr 102.3 > most recent 100. Trach aspirate growing few GPC and rare GNR but abundant staph aureus > still awaiting susceptibilities. Scr up slightly at 1.05 (CrCl 52 mL/min).   Plan: Order vancomycin  750 mg x1 then 500 mg IV every 24 hours (estAUC 556) Monitor Scr trend, cx results, clinical pic, vanc levels as appropriate  Height: 5' 7 (170.2 cm) Weight: 37.3 kg (82 lb 3.7 oz) IBW/kg (Calculated) : 66.1  Temp (24hrs), Avg:99.1 F (37.3 C), Min:97.7 F (36.5 C), Max:100.9 F (38.3 C)  Recent Labs  Lab 12/05/23 0821 12/05/23 1559 12/06/23 0300 12/07/23 0418 12/08/23 0506 12/10/23 0217  WBC 9.9  --  6.6 6.8 8.3 8.1  CREATININE 0.68 0.95 0.85 0.82 0.73 1.05    Estimated Creatinine Clearance: 52.8 mL/min (by C-G formula based on SCr of 1.05 mg/dL).    No Known Allergies  Antimicrobials this admission: Azithromycin  6/14 q wk >>  Ganciclovir  6/22>> 7/4 Cefepime  6/22 >> 6/28 Vanc 6/22 >> 6/23, 7/6 >> Bactrim  6/4 >> 6/23 Primaquin/clinda 6/23>> 6/24 Zyvox  6/23>> 6/28  Dose adjustments this admission: N/A  Microbiology results: 7/5 Trash aspirate cx: abundant staph aureus  7/1 BAL: rare candida albicans 6/27 Bcx: ngtd  6/23 BAL: rare candida albicans 6/21 MRSA PCR: positive  6/13 Sputum cx: norm flora 6/4 Bcx: ngtd  6/4 Resp PCR: neg   Thank you for allowing pharmacy to participate in this patient's care,  Suzen Sour, PharmD, BCCCP Clinical Pharmacist  Phone: [phone] 12/10/2023 12:23 PM  Please check AMION for all [facility] Pharmacy phone numbers After 10:00 PM, call Main Pharmacy [phone]

## 2023-12-10 NOTE — Progress Notes (Signed)
 eLink Physician-Brief Progress Note Patient Name: Cory Russell DOB: [birth year] MRN: [id number]   Date of Service  12/10/2023  HPI/Events of Note  Hyperkalemia.  A.m. labs with potassium of 6.0.  eICU Interventions  Medical management ordered.     Intervention Category Intermediate Interventions: Electrolyte abnormality - evaluation and management  Jerilynn Berg 12/10/2023, 4:22 AM

## 2023-12-10 NOTE — Progress Notes (Addendum)
 NAME:  Cory Russell, MRN:  [id number], DOB:  1990/07/31, LOS: 32 ADMISSION DATE:  11/08/2023, CONSULTATION DATE: 11/19/2023 REFERRING MD:  Jonel - TRH, CHIEF COMPLAINT:  Respiratory failure   History of Present Illness:  Cory Russell is a 33 y.o. man with past medical history of HIV/AIDS, CD4<35 on admission, not on ARVs. Presents with seizure like activity 11 days ago and found to have sepsis from pneumonia.  He notes symptoms started about 2-3 weeks ago with shortness of breath and dry cough. He had two separate rounds of antibiotics visiting other urgent care and ED visits. He has tried albuterol  nebulizer treatments from friends and initially found them helpful. CT Chest shows bilateral ground glass opacities concerning for pneumoncystis pneumonia. Since being here he has been Started on empiric antibiotics with bactrim  and steroids. Also resumed ARVs during this admission. Has been persistently hypoxemic on 10LNC. Pulmonary consulted to assist with evaluation and management and for bronchoscopy.   He feels persistently short of breath, worse with exertion. He feels like chest PT is helping him. Feeling more anxious. Also having difficulty taking pills.   Pertinent Medical History:  HIV, Seizure disorder  Significant Hospital Events: Including procedures, antibiotic start and stop dates in addition to other pertinent events   6/04 Admit 6/15 Pulmonary consult 6/18 Transient desats with worsening chest x-ray.  Prednisone  changed to IV Solu-Medrol  6/22 Worsening respiratory status requiring ICU transfer and intubation 6/23 Bronch for BAL per ID request. NO being weaned from 20 to 15. Overnight L PTX s/p chest tube. 6/25 Required transition to SIMV vent mode due to dyssynchrony. Sedation increased. Paralytic push given. CXR improved s/p diuresis. Required secondary L pigtail for persistent PTX.  6/26 Large bore chest tube placed, pigtail removed. 6/30 discussed plan for trach. Weaning  sedation  7/1 trach 7/2 weaning sedation. Chest tube inadvertently malfunctioned.   7/3 weaning sedation, vent  7/4 CMV viral load came back higher than before, question of resistance.  Ganciclovir  stopped by ID.  Gets tachypneic with dysfunctional breathing trial 7/5 spiked fever with Tmax 102.3, remained tachycardic in 120s  Interim History / Subjective:  Patient is afebrile now Respiratory culture is growing abundant Staph aureus He is tachypneic and tachycardic  Objective:   Blood pressure 112/72, pulse (!) 135, temperature 100 F (37.8 C), temperature source Oral, resp. rate (!) 22, height 5' 7 (1.702 m), weight 37.3 kg, SpO2 93%.    Vent Mode: CPAP;PSV FiO2 (%):  [50 %] 50 % Set Rate:  [18 bmp] 18 bmp Vt Set:  [410 mL] 410 mL PEEP:  [8 cmH20] 8 cmH20 Pressure Support:  [8 cmH20-10 cmH20] 10 cmH20   Intake/Output Summary (Last 24 hours) at 12/10/2023 1211 Last data filed at 12/10/2023 0800 Gross per 24 hour  Intake 215 ml  Output 1700 ml  Net -1485 ml   Filed Weights   12/07/23 0221 12/08/23 0300 12/09/23 0500  Weight: 39.5 kg 36.2 kg 37.3 kg   Physical Examination: General: Acute on chronically ill-appearing young cachetic male, lying on the bed HEENT: [place]/AT, eyes anicteric.  moist mucus membranes.  Status post trach, subcu emphysema noted in both sides of neck and upper part of chest Neuro: Alert, awake following commands, generalized weak Chest: Tachypneic, coarse breath sounds, no wheezes or rhonchi.  Left-sided chest tube in place, still with airleak Heart: Tachycardic, regular rhythm, no murmurs or gallops Abdomen: Soft, nontender, nondistended, bowel sounds present  Labs and images reviewed  Patient Lines/Drains/Airways Status  Active Line/Drains/Airways     Name Placement date Placement time Site Days   Peripheral IV 11/19/23 20 G Anterior;Right Forearm 11/19/23  1338  Forearm  19   Peripheral IV 11/26/23 20 G Left Forearm 11/26/23  0105  Forearm  12    Chest Tube 1 Lateral;Left Pleural 32 Fr. 11/30/23  1130  Pleural  8   External Urinary Catheter 12/06/23  1430  --  2   Fecal Management System 45 mL 12/07/23  1835  -- 1   Small Bore Feeding Tube 10 Fr. Left nare Marking at nare/corner of mouth 73 cm 11/27/23  1210  Left nare  11   Tracheostomy Shiley Flexible 6 mm Cuffed 12/05/23  1149  6 mm  3         Resolved Hospital problems  Probable severe PJP pneumonia Sepsis due to bilateral superimposed bacterial pneumonia with ARDS Acute septic encephalopathy  Assessment and Plan:  Acute respiratory failure with hypoxia and hypercapnia status post trach Sepsis due to recurrent bilateral multifocal pneumonia, now with Staph aureus Left-sided pneumothorax and pneumomediastinum with subcutaneous emphysema status post chest tube HIV AIDS Probable IRIS CMV viremia/pneumonitis Cachexia/severe protein calorie malnutrition Seizure disorder Anemia of critical illness Hyponatremia, corrected AKI due to septic ATN Hyperkalemia   Continue lung protective ventilation VAP prevention bundle in place He is tachypneic but tolerating spontaneous breathing trial with pressure support of 10 Repeat respiratory culture is growing abundant Staph aureus Restarted back on antibiotic vancomycin , follow-up sensitivity result and adjust antibiotic accordingly TOC consult requested for LTAC He has large subcu emphysema extending to both sides of neck and upper part of chest Chest tube in place, still with a air leak ID is following Continue Klonopin  Ganciclovir  was stopped by ID as CMV viral count has increased from prior The possibility of resistance to ganciclovir , PCR was sent to rule out resistance Continue ART Continue dapsone  and azithromycin  for PJP and MAC prophylaxis respectively Discontinue steroid Continue Depakote  Monitor H&H Continue tube feeds Decrease free water  100 cc every 4 hour Lasix  is stopped Monitor intake and output Avoid  nephrotoxic agent Received medical treatment for hyperkalemia with calcium  gluconate and Lokelma    Best Practice (right click and Reselect all SmartList Selections daily)   Diet/type: TF DVT prophylaxis prophylactic heparin   Pressure ulcer(s): N/A GI prophylaxis: H2B Lines: na Foley:  na Code Status:  full code Last date of multidisciplinary goals of care discussion: 7/6 updated mom at bedside    The patient is critically ill due to Acute respiratory failure.  Critical care was necessary to treat or prevent imminent or life-threatening deterioration.  Critical care was time spent personally by me on the following activities: development of treatment plan with patient and/or surrogate as well as nursing, discussions with consultants, evaluation of patient's response to treatment, examination of patient, obtaining history from patient or surrogate, ordering and performing treatments and interventions, ordering and review of laboratory studies, ordering and review of radiographic studies, pulse oximetry, re-evaluation of patient's condition and participation in multidisciplinary rounds.   During this encounter critical care time was devoted to patient care services described in this note for 34 minutes.   Valinda Novas, MD [facility] See Amion for pager If no response to pager, please call [phone] until 7pm After 7pm, Please call E-link [phone]

## 2023-12-10 NOTE — Progress Notes (Addendum)
 [facility]    Date of Admission:  11/08/2023   Total days of antibiotics 32 (biktarvy , DRVc)         Prophylactics: azithro, bactrim            ID: Cory Russell is a 33 y.o. 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   Medications:   arformoterol   15 mcg Nebulization BID   azithromycin   1,200 mg Per Tube Weekly   bictegravir-emtricitabine -tenofovir  AF  1 tablet Oral Daily   Chlorhexidine  Gluconate Cloth  6 each Topical Daily   clonazepam   1 mg Per Tube TID   dapsone   100 mg Per Tube Daily   darunavir -cobicistat   1 tablet Oral Q breakfast   famotidine   20 mg Per Tube BID   fiber  1 packet Per Tube BID   free water   200 mL Per Tube Q4H   furosemide   40 mg Intravenous Q12H   heparin  injection (subcutaneous)  5,000 Units Subcutaneous Q8H   insulin  aspart  0-9 Units Subcutaneous Q4H   insulin  aspart  3 Units Subcutaneous Q4H   methylPREDNISolone  (SOLU-MEDROL ) injection  40 mg Intravenous Daily   metoprolol  tartrate  25 mg Per Tube Q8H   multivitamin with minerals  1 tablet Per Tube Daily   OLANZapine   5 mg Per Tube QHS   mouth rinse  15 mL Mouth Rinse Q2H   oxyCODONE   10 mg Per Tube Q6H   revefenacin   175 mcg Nebulization Daily   thiamine   100 mg Per Tube Daily   valproic  acid  500 mg Per Tube BID     Objective: Vital signs in last 24 hours: Temp:  [97.7 F (36.5 C)-102.3 F (39.1 C)] 99.1 F (37.3 C) (07/06 0803) Pulse Rate:  [108-146] 135 (07/06 0749) Resp:  [14-39] 22 (07/06 0749) BP: (88-127)/(58-85) 112/72 (07/06 0400) SpO2:  [92 %-96 %] 93 % (07/06 0749) FiO2 (%):  [50 %] 50 % (07/06 0749)   General appearance: alert and no distress Head: facial/cheek swelling. No objective tenderness to sinus percussion.  Eyes: negative findings: conjunctivae and sclerae normal and pupils equal, round, reactive to light and accomodation Resp: rhonchi anterior - bilateral Cardio: regularly irregular rhythm GI: normal findings: bowel sounds normal and soft, non-tender Extremities: edema none  Lab Results Recent Labs    12/08/23 0506 12/10/23 0217  WBC 8.3 8.1  HGB 7.5* 7.5*  HCT 23.5* 22.8*  NA 146* 142  K 4.5 6.0*  CL 103 96*  CO2 36* 33*  BUN 43* 64*  CREATININE 0.73 1.05   Liver Panel No results for input(s): PROT, ALBUMIN, AST, ALT, ALKPHOS, BILITOT, BILIDIR, IBILI in the last 72 hours. Sedimentation Rate No results for input(s): ESRSEDRATE in the last 72 hours. C-Reactive Protein No results for input(s): CRP in the last 72 hours.  Microbiology:  Studies/Results: No results found.   Assessment/Plan: AIDS CMV viremia Pneumonia Anemia Moderate Protein Calorie Malnutrition Hyperkalemia  Continue to hold his GCV Suspect facial swelling from steroids.  His temp has improved as has his BP. He remains tachycardic. His anemia is slightly worse.  Nutrition.  K+ addressed by primary team.   Cory Russell for Infectious Diseases Pager: [phone]  12/10/2023, 10:44 AM

## 2023-12-11 ENCOUNTER — Other Ambulatory Visit: Payer: Self-pay

## 2023-12-11 ENCOUNTER — Other Ambulatory Visit (HOSPITAL_COMMUNITY): Payer: Self-pay

## 2023-12-11 ENCOUNTER — Inpatient Hospital Stay (HOSPITAL_COMMUNITY)

## 2023-12-11 DIAGNOSIS — B20 Human immunodeficiency virus [HIV] disease: Secondary | ICD-10-CM | POA: Diagnosis not present

## 2023-12-11 DIAGNOSIS — L988 Other specified disorders of the skin and subcutaneous tissue: Secondary | ICD-10-CM

## 2023-12-11 DIAGNOSIS — B250 Cytomegaloviral pneumonitis: Secondary | ICD-10-CM | POA: Diagnosis not present

## 2023-12-11 DIAGNOSIS — J95851 Ventilator associated pneumonia: Secondary | ICD-10-CM | POA: Diagnosis not present

## 2023-12-11 DIAGNOSIS — J9602 Acute respiratory failure with hypercapnia: Secondary | ICD-10-CM | POA: Diagnosis not present

## 2023-12-11 DIAGNOSIS — J15211 Pneumonia due to Methicillin susceptible Staphylococcus aureus: Secondary | ICD-10-CM | POA: Diagnosis not present

## 2023-12-11 DIAGNOSIS — J939 Pneumothorax, unspecified: Secondary | ICD-10-CM | POA: Diagnosis not present

## 2023-12-11 DIAGNOSIS — R000 Tachycardia, unspecified: Secondary | ICD-10-CM

## 2023-12-11 DIAGNOSIS — J9601 Acute respiratory failure with hypoxia: Secondary | ICD-10-CM | POA: Diagnosis not present

## 2023-12-11 LAB — CBC
HCT: 19.8 % — ABNORMAL LOW (ref 39.0–52.0)
Hemoglobin: 6.4 g/dL — CL (ref 13.0–17.0)
MCH: 30.9 pg (ref 26.0–34.0)
MCHC: 32.3 g/dL (ref 30.0–36.0)
MCV: 95.7 fL (ref 80.0–100.0)
Platelets: 206 K/uL (ref 150–400)
RBC: 2.07 MIL/uL — ABNORMAL LOW (ref 4.22–5.81)
RDW: 19.5 % — ABNORMAL HIGH (ref 11.5–15.5)
WBC: 5.3 K/uL (ref 4.0–10.5)
nRBC: 0.4 % — ABNORMAL HIGH (ref 0.0–0.2)

## 2023-12-11 LAB — BASIC METABOLIC PANEL WITH GFR
Anion gap: 10 (ref 5–15)
BUN: 70 mg/dL — ABNORMAL HIGH (ref 6–20)
CO2: 34 mmol/L — ABNORMAL HIGH (ref 22–32)
Calcium: 8.2 mg/dL — ABNORMAL LOW (ref 8.9–10.3)
Chloride: 96 mmol/L — ABNORMAL LOW (ref 98–111)
Creatinine, Ser: 1.26 mg/dL — ABNORMAL HIGH (ref 0.61–1.24)
GFR, Estimated: >60 mL/min (ref >60)
Glucose, Bld: 205 mg/dL — ABNORMAL HIGH (ref 70–99)
Potassium: 4.9 mmol/L (ref 3.5–5.1)
Sodium: 140 mmol/L (ref 135–145)

## 2023-12-11 LAB — GLUCOSE, CAPILLARY
Glucose-Capillary: 214 mg/dL — ABNORMAL HIGH (ref 70–99)
Glucose-Capillary: 74 mg/dL (ref 70–99)
Glucose-Capillary: 178 mg/dL — ABNORMAL HIGH (ref 70–99)
Glucose-Capillary: 202 mg/dL — ABNORMAL HIGH (ref 70–99)
Glucose-Capillary: 112 mg/dL — ABNORMAL HIGH (ref 70–99)
Glucose-Capillary: 21 mg/dL — CL (ref 70–99)
Glucose-Capillary: 22 mg/dL — CL (ref 70–99)
Glucose-Capillary: 152 mg/dL — ABNORMAL HIGH (ref 70–99)

## 2023-12-11 LAB — HEMOGLOBIN AND HEMATOCRIT, BLOOD
HCT: 21.3 % — ABNORMAL LOW (ref 39.0–52.0)
Hemoglobin: 7.1 g/dL — ABNORMAL LOW (ref 13.0–17.0)

## 2023-12-11 LAB — CULTURE, RESPIRATORY W GRAM STAIN

## 2023-12-11 LAB — ABO/RH: ABO/RH(D): O POS

## 2023-12-11 LAB — PREPARE RBC (CROSSMATCH)

## 2023-12-11 MED ORDER — LINEZOLID 600 MG PO TABS
600.0000 mg | ORAL_TABLET | Freq: Two times a day (BID) | ORAL | Status: DC
  Administered 2023-12-11 – 2023-12-13 (×4): 600 mg
  Filled 2023-12-11 (×4): qty 1

## 2023-12-11 MED ORDER — LINEZOLID 600 MG/300ML IV SOLN
600.0000 mg | Freq: Two times a day (BID) | INTRAVENOUS | Status: DC
  Filled 2023-12-11 (×2): qty 300

## 2023-12-11 MED ORDER — DEXTROSE 50 % IV SOLN: 25.0000 g | INTRAVENOUS | Status: AC

## 2023-12-11 MED ORDER — SODIUM CHLORIDE 0.9% IV SOLUTION: Freq: Once | INTRAVENOUS | Status: AC

## 2023-12-11 MED ORDER — DARUNAVIR-COBICISTAT 800-150 MG PO TABS
1.0000 | ORAL_TABLET | Freq: Every day | ORAL | Status: DC
  Administered 2023-12-11 – 2023-12-15 (×5): 1 via ORAL
  Filled 2023-12-11 (×6): qty 1

## 2023-12-11 MED ORDER — INSULIN ASPART 100 UNIT/ML IJ SOLN
4.0000 [IU] | INTRAMUSCULAR | Status: DC
  Administered 2023-12-11 – 2023-12-13 (×7): 4 [IU] via SUBCUTANEOUS

## 2023-12-11 MED ORDER — FENTANYL CITRATE PF 50 MCG/ML IJ SOSY
25.0000 ug | PREFILLED_SYRINGE | Freq: Once | INTRAMUSCULAR | Status: AC
  Administered 2023-12-11: 25 ug via INTRAVENOUS
  Filled 2023-12-11: qty 1

## 2023-12-11 MED ORDER — SODIUM CHLORIDE 0.9% IV SOLUTION: Freq: Once | INTRAVENOUS | Status: DC

## 2023-12-11 MED ORDER — DEXTROSE 50 % IV SOLN
INTRAVENOUS | Status: AC
  Administered 2023-12-11: 25 g via INTRAVENOUS
  Filled 2023-12-11: qty 50

## 2023-12-11 NOTE — Inpatient Diabetes Management (Signed)
 Inpatient Diabetes Program Recommendations  AACE/ADA: New Consensus Statement on Inpatient Glycemic Control (2015)  Target Ranges:  Prepandial:   less than 140 mg/dL      Peak postprandial:   less than 180 mg/dL (1-2 hours)      Critically ill patients:  140 - 180 mg/dL    Latest Reference Range & Units 12/09/23 23:14 12/10/23 03:18 12/10/23 05:30 12/10/23 08:00 12/10/23 11:32 12/10/23 16:23 12/10/23 19:23 12/10/23 19:46  Glucose-Capillary 70 - 99 mg/dL 871 (H)  4 units Novolog   226 (H)  6 units Novolog   327 (H)  5 units Novolog  +  50 ml D50% for high K+ 211 (H)  6 units Novolog   189 (H)  5 units Novolog   120 (H)  5 units Novolog   69 (L) 76  0 units Novolog    (H): Data is abnormally high (L): Data is abnormally low  Latest Reference Range & Units 12/10/23 23:01 12/11/23 03:21 12/11/23 07:24  Glucose-Capillary 70 - 99 mg/dL 848 (H)  7 units Novolog   214 (H)  13 units Novolog   74  (H): Data is abnormally high   Current Orders: Novolog  Sensitive Correction Scale/ SSI (0-9 units) Q4 hours     Novolog  5 units Q4 hours     MD- Note mid Hypoglycemia last PM  CBG 74 this AM after getting 13 units Novolog  (SSI + Tube Feed cov) at 4am  Please consider reducing the Novolog  Tube Feed coverage slightly to 4 units Q4 hours    --Will follow patient during hospitalization--  Adina Rudolpho Arrow RN, MSN, CDCES Diabetes Coordinator Inpatient Glycemic Control Team Team Pager: [phone] (8a-5p)

## 2023-12-11 NOTE — Progress Notes (Signed)
 SLP Cancellation Note  Patient Details Name: Eriel Doyon MRN: [id number] DOB: 12-11-1990   Cancelled treatment:       Reason Eval/Treat Not Completed: Other (comment). Following for medical readiness for SLP interventions   Macil Crady, Consuelo Fitch 12/11/2023, 7:46 AM

## 2023-12-11 NOTE — Progress Notes (Addendum)
 eLink Physician-Brief Progress Note Patient Name: Cory Russell DOB: 1991/02/11 MRN: [id number]   Date of Service  12/11/2023  HPI/Events of Note  Hb 6.4. No bleeding from anywhere. Also just informed that Lopressor  was held at 10 pm as BP was 90. I note he has had tachycardia during the day as well and also fevers on and off. Negative balance charted so could very well be dry   eICU Interventions  1 unit RBC to be transfused RN to verify consent     Intervention Category Major Interventions: Other:;Arrhythmia - evaluation and management  Dominiqua Cooner G Ibrahima Holberg 12/11/2023, 4:34 AM  Addendum at 5:30 am - Patient got oxycodone  at 10 pm and 4 am but RN feels he is still in pain. Asking for something iV. Fentanyl  x1 ordered.

## 2023-12-11 NOTE — Plan of Care (Signed)
  Problem: Clinical Measurements: Goal: Diagnostic test results will improve Outcome: Progressing Goal: Respiratory complications will improve Outcome: Progressing Goal: Cardiovascular complication will be avoided Outcome: Progressing   Problem: Nutrition: Goal: Adequate nutrition will be maintained Outcome: Progressing   Problem: Elimination: Goal: Will not experience complications related to urinary retention Outcome: Progressing   Problem: Pain Managment: Goal: General experience of comfort will improve and/or be controlled Outcome: Progressing

## 2023-12-11 NOTE — Progress Notes (Signed)
 eLink Physician-Brief Progress Note Patient Name: Cory Russell DOB: 11-15-1990 MRN: [id number]   Date of Service  12/11/2023  HPI/Events of Note  F/U Hgb from 9pm was 7.1, no notable bleeding but the pt just continues to be so tachy 136 B/P 81/52(62)        pt has been so tachy all weekend, the RBC's given earlier today seemed to help rate,  asking for the possibility of another unit     plus can you please look at that CXR from this am- do we need to reassess the current CT   sats 84%  Camera eval done. In synchrony with vent. Sats 94, MAP 62. HR 132, sinus tachy.   CxR from AM images seen: chest tube, trach in place. Right  > left peri hilar, mid zone air space densities.   Discussed with RN.   eICU Interventions  1 PRBC ordered Get CxR /ABG stat.      Intervention Category Intermediate Interventions: Other:  [name] 12/11/2023, 11:50 PM  00:12 RBC's was just ordered , just letting you know that tempis 101, RN will give tylenol  ID on board: ? CMV pneumonitis. VAP-MRSA . AIDS.  - transfuse once fever gets better < 98 degree or so.  Wbc 5.3. On linezolid . Azee prophylaxis/on ART.   01:53 CxR overall stable to worsening of right peri hilar, mid air space densities. Left chest tube in place and  sub cutaneous emphysema stable ABG: will allow permissive hypercapnia In view of pneumo thorax/pneumomediastinum to prevent barotrauma.

## 2023-12-11 NOTE — Progress Notes (Signed)
 Subjective: No new complaints   Antibiotics:  Anti-infectives (From admission, onward)    Start     Dose/Rate Route Frequency Ordered Stop   12/11/23 1800  darunavir -cobicistat  (PREZCOBIX ) 800-150 MG per tablet 1 tablet        1 tablet Oral Daily with supper 12/11/23 0723     12/11/23 1400  vancomycin  (VANCOREADY) IVPB 500 mg/100 mL        500 mg 100 mL/hr over 60 Minutes Intravenous Every 24 hours 12/10/23 1238     12/10/23 1330  vancomycin  (VANCOREADY) IVPB 750 mg/150 mL        750 mg 150 mL/hr over 60 Minutes Intravenous  Once 12/10/23 1238 12/10/23 1457   12/10/23 1315  vancomycin  (VANCOREADY) IVPB 500 mg/100 mL  Status:  Discontinued        500 mg 100 mL/hr over 60 Minutes Intravenous Every 24 hours 12/10/23 1226 12/10/23 1238   12/02/23 2000  azithromycin  (ZITHROMAX ) tablet 1,200 mg        1,200 mg Per Tube Weekly 11/26/23 1001     11/29/23 1700  ganciclovir  (CYTOVENE ) 250 mg in sodium chloride  0.9 % 100 mL IVPB  Status:  Discontinued        250 mg 100 mL/hr over 60 Minutes Intravenous Every 12 hours 11/29/23 0851 12/08/23 1437   11/29/23 1000  dapsone  tablet 100 mg        100 mg Per Tube Daily 11/28/23 1222     11/28/23 1800  ceFEPIme  (MAXIPIME ) 2 g in sodium chloride  0.9 % 100 mL IVPB        2 g 200 mL/hr over 30 Minutes Intravenous Every 8 hours 11/28/23 1253 12/02/23 1835   11/27/23 1200  clindamycin  (CLEOCIN ) IVPB 600 mg        600 mg 100 mL/hr over 30 Minutes Intravenous Every 6 hours 11/27/23 0909 11/28/23 2033   11/27/23 1030  linezolid  (ZYVOX ) tablet 600 mg        600 mg Per Tube Every 12 hours 11/27/23 0942 12/02/23 2103   11/27/23 1000  primaquine  tablet 30 mg        30 mg Per Tube Daily 11/27/23 0909 11/28/23 1100   11/27/23 0500  ganciclovir  (CYTOVENE ) 130 mg in sodium chloride  0.9 % 100 mL IVPB  Status:  Discontinued        2.5 mg/kg  51.2 kg 100 mL/hr over 60 Minutes Intravenous Every 12 hours 11/26/23 1612 11/26/23 1612   11/27/23 0500   ganciclovir  (CYTOVENE ) 125 mg in sodium chloride  0.9 % 100 mL IVPB  Status:  Discontinued        2.5 mg/kg  49.3 kg 100 mL/hr over 60 Minutes Intravenous Every 12 hours 11/26/23 1612 11/29/23 0851   11/26/23 1000  vancomycin  (VANCOCIN ) IVPB 1000 mg/200 mL premix        1,000 mg 200 mL/hr over 60 Minutes Intravenous  Once 11/26/23 0906 11/26/23 1116   11/26/23 0950  vancomycin  variable dose per unstable renal function (pharmacist dosing)  Status:  Discontinued         Does not apply See admin instructions 11/26/23 0950 11/27/23 0942   11/26/23 0945  ceFEPIme  (MAXIPIME ) 2 g in sodium chloride  0.9 % 100 mL IVPB  Status:  Discontinued        2 g 200 mL/hr over 30 Minutes Intravenous Every 12 hours 11/26/23 0849 11/28/23 1253   11/26/23 0200  ganciclovir  (CYTOVENE ) 255 mg in sodium chloride  0.9 %  100 mL IVPB  Status:  Discontinued        5 mg/kg  51.2 kg 100 mL/hr over 60 Minutes Intravenous Every 12 hours 11/26/23 0008 11/26/23 1612   11/24/23 1400  sulfamethoxazole -trimethoprim  (BACTRIM ) 320 mg of trimethoprim  in dextrose  5 % 500 mL IVPB  Status:  Discontinued        320 mg of trimethoprim  346.7 mL/hr over 90 Minutes Intravenous Every 8 hours 11/24/23 0917 11/27/23 0909   11/22/23 1200  sulfamethoxazole -trimethoprim  (BACTRIM ) 256 mg of trimethoprim  in dextrose  5 % 500 mL IVPB  Status:  Discontinued        15 mg/kg/day of trimethoprim   51.2 kg 344 mL/hr over 90 Minutes Intravenous Every 8 hours 11/22/23 0929 11/24/23 0917   11/20/23 1800  clindamycin  (CLEOCIN ) IVPB 600 mg  Status:  Discontinued        600 mg 100 mL/hr over 30 Minutes Intravenous Every 6 hours 11/20/23 1438 11/22/23 0929   11/20/23 1530  primaquine  tablet 30 mg  Status:  Discontinued        30 mg Oral Daily 11/20/23 1438 11/22/23 0929   11/18/23 2000  azithromycin  (ZITHROMAX ) tablet 1,200 mg  Status:  Discontinued        1,200 mg Oral Weekly 11/18/23 1907 11/26/23 1001   11/17/23 1500  sulfamethoxazole -trimethoprim  (BACTRIM )  272.96 mg of trimethoprim  in dextrose  5 % 500 mL IVPB  Status:  Discontinued        15 mg/kg/day of trimethoprim   54.6 kg 344.7 mL/hr over 90 Minutes Intravenous Every 8 hours 11/17/23 1409 11/20/23 1437   11/16/23 1400  sulfamethoxazole -trimethoprim  (BACTRIM ) 200-40 MG/5ML suspension 30 mL  Status:  Discontinued        30 mL Oral Every 8 hours 11/16/23 0918 11/17/23 1409   11/12/23 2200  doxycycline  (VIBRA -TABS) tablet 100 mg  Status:  Discontinued        100 mg Oral Every 12 hours 11/12/23 1210 11/16/23 0914   11/09/23 1800  cefTRIAXone  (ROCEPHIN ) 2 g in sodium chloride  0.9 % 100 mL IVPB  Status:  Discontinued        2 g 200 mL/hr over 30 Minutes Intravenous Every 24 hours 11/09/23 1505 11/10/23 0946   11/08/23 1000  bictegravir-emtricitabine -tenofovir  AF (BIKTARVY ) 50-200-25 MG per tablet 1 tablet        1 tablet Oral Daily 11/08/23 0847     11/08/23 0930  sulfamethoxazole -trimethoprim  (BACTRIM ) 244.96 mg of trimethoprim  in dextrose  5 % 250 mL IVPB  Status:  Discontinued        15 mg/kg/day of trimethoprim   49 kg 265.3 mL/hr over 60 Minutes Intravenous Every 8 hours 11/08/23 0844 11/16/23 0918   11/08/23 0445  darunavir -cobicistat  (PREZCOBIX ) 800-150 MG per tablet 1 tablet  Status:  Discontinued        1 tablet Oral Daily with breakfast 11/08/23 0350 12/11/23 0723   11/08/23 0445  doxycycline  (VIBRAMYCIN ) 100 mg in sodium chloride  0.9 % 250 mL IVPB  Status:  Discontinued        100 mg 125 mL/hr over 120 Minutes Intravenous 2 times daily 11/08/23 0351 11/12/23 1210   11/08/23 0330  Ampicillin -Sulbactam (UNASYN ) 3 g in sodium chloride  0.9 % 100 mL IVPB  Status:  Discontinued        3 g 200 mL/hr over 30 Minutes Intravenous 2 times daily 11/08/23 0230 11/08/23 0236   11/08/23 0330  Ampicillin -Sulbactam (UNASYN ) 3 g in sodium chloride  0.9 % 100 mL IVPB  Status:  Discontinued  3 g 200 mL/hr over 30 Minutes Intravenous Every 6 hours 11/08/23 0236 11/09/23 1505        Medications: Scheduled Meds:  sodium chloride    Intravenous Once   arformoterol   15 mcg Nebulization BID   azithromycin   1,200 mg Per Tube Weekly   bictegravir-emtricitabine -tenofovir  AF  1 tablet Oral Daily   Chlorhexidine  Gluconate Cloth  6 each Topical Daily   clonazepam   1 mg Per Tube TID   dapsone   100 mg Per Tube Daily   darunavir -cobicistat   1 tablet Oral Q supper   famotidine   20 mg Per Tube BID   fiber  1 packet Per Tube BID   heparin  injection (subcutaneous)  5,000 Units Subcutaneous Q8H   insulin  aspart  0-9 Units Subcutaneous Q4H   insulin  aspart  4 Units Subcutaneous Q4H   metoprolol  tartrate  25 mg Per Tube Q8H   multivitamin with minerals  1 tablet Per Tube Daily   OLANZapine   5 mg Per Tube QHS   mouth rinse  15 mL Mouth Rinse Q2H   oxyCODONE   10 mg Per Tube Q6H   revefenacin   175 mcg Nebulization Daily   thiamine   100 mg Per Tube Daily   valproic  acid  500 mg Per Tube BID   Continuous Infusions:  feeding supplement (VITAL AF 1.2 CAL) 1,000 mL (12/11/23 1110)   vancomycin      PRN Meds:.acetaminophen , alum & mag hydroxide-simeth, docusate, hydrocortisone , hydrOXYzine , ipratropium-albuterol , melatonin, mouth rinse, oxyCODONE , polyethylene glycol, sodium chloride     Objective: Weight change:   Intake/Output Summary (Last 24 hours) at 12/11/2023 1211 Last data filed at 12/11/2023 0900 Gross per 24 hour  Intake 560 ml  Output 1950 ml  Net -1390 ml   Blood pressure (!) 96/59, pulse (!) 150, temperature (!) 103.3 F (39.6 C), temperature source Oral, resp. rate (!) 23, height 5' 7 (1.702 m), weight 37.7 kg, SpO2 94%. Temp:  [98.5 F (36.9 C)-103.3 F (39.6 C)] 103.3 F (39.6 C) (07/07 1114) Pulse Rate:  [128-155] 150 (07/07 0900) Resp:  [17-39] 23 (07/07 0900) BP: (85-120)/(55-84) 96/59 (07/07 1114) SpO2:  [89 %-99 %] 94 % (07/07 0900) FiO2 (%):  [40 %-70 %] 60 % (07/07 1114) Weight:  [37.7 kg] 37.7 kg (07/07 0500)  Physical Exam: Physical  Exam HENT:     Head: Normocephalic and atraumatic.  Neck:     Trachea: Tracheostomy present.  Cardiovascular:     Rate and Rhythm: Regular rhythm. Tachycardia present.     Heart sounds: No murmur heard.    No friction rub. No gallop.  Pulmonary:     Effort: No respiratory distress.     Breath sounds: Rhonchi present. No wheezing.  Abdominal:     General: There is no distension.  Skin:    General: Skin is warm and dry.  Neurological:     General: No focal deficit present.     Mental Status: He is alert and oriented to person, place, and time.  Psychiatric:        Behavior: Behavior normal.      CBC:    BMET Recent Labs    12/10/23 0217 12/11/23 0228  NA 142 140  K 6.0* 4.9  CL 96* 96*  CO2 33* 34*  GLUCOSE 190* 205*  BUN 64* 70*  CREATININE 1.05 1.26*  CALCIUM  9.0 8.2*     Liver Panel  No results for input(s): PROT, ALBUMIN, AST, ALT, ALKPHOS, BILITOT, BILIDIR, IBILI in the last 72 hours.  Sedimentation Rate No results for input(s): ESRSEDRATE in the last 72 hours. C-Reactive Protein No results for input(s): CRP in the last 72 hours.  Micro Results: Recent Results (from the past 720 hours)  Expectorated Sputum Assessment w Gram Stain, Rflx to Resp Cult     Status: None   Collection Time: 11/17/23  2:20 PM   Specimen: Expectorated Sputum  Result Value Ref Range Status   Specimen Description EXPECTORATED SPUTUM  Final   Special Requests Immunocompromised  Final   Sputum evaluation   Final    THIS SPECIMEN IS ACCEPTABLE FOR SPUTUM CULTURE Performed at [facility], [address], [place], [place] [address]    Report Status 11/17/2023 FINAL  Final  Culture, Respiratory w Gram Stain     Status: None   Collection Time: 11/17/23  2:20 PM  Result Value Ref Range Status   Specimen Description EXPECTORATED SPUTUM  Final   Special Requests Immunocompromised Reflexed from [id number]  Final   Gram Stain NO WBC SEEN FEW GRAM  POSITIVE COCCI IN PAIRS   Final   Culture   Final    MODERATE Normal respiratory flora-no Staph aureus or Pseudomonas seen Performed at [facility], [address] [place], [place] [address]    Report Status 11/19/2023 FINAL  Final  Blastomyces Antigen     Status: None   Collection Time: 11/25/23  6:05 AM   Specimen: Blood  Result Value Ref Range Status   Blastomyces Antigen None Detected None Detected ng/mL Final    Comment: (NOTE) Reference Interval: None Detected Reportable Range: 0.31 ng/mL - 20.00 ng/mL Results above 20.00 ng/mL are reported as 'Positive, Above the Limit of Quantification' This test was developed and its performance characteristics determined by [employer]. It has not been cleared or approved by the FDA; however, FDA clearance or approval is not currently required for clinical use. The results are not intended to be used as the sole means for clinical diagnosis or patient decisions.    Interpretation Negative  Final   Specimen Type SERUM  Final    Comment: (NOTE) Performed At: [facility] [address] [place], [place] [id number] [place] Pac MD Ey:[id number]   MRSA Next Gen by PCR, Nasal     Status: Abnormal   Collection Time: 11/25/23 11:35 PM   Specimen: Nasal Mucosa; Nasal Swab  Result Value Ref Range Status   MRSA by PCR Next Gen DETECTED (A) NOT DETECTED Final    Comment: RESULT CALLED TO, READ BACK BY AND VERIFIED WITH: SHAFFNER,P RN 11/26/2023 AT 0218 SKEEN,P (NOTE) The GeneXpert MRSA Assay (FDA approved for NASAL specimens only), is one component of a comprehensive MRSA colonization surveillance program. It is not intended to diagnose MRSA infection nor to guide or monitor treatment for MRSA infections. Test performance is not FDA approved in patients less than 63 years old. Performed at [facility], [address] [place], [place] [address]   Culture, Respiratory w Gram Stain     Status: None    Collection Time: 11/27/23  9:27 AM   Specimen: Bronchoalveolar Lavage; Respiratory  Result Value Ref Range Status   Specimen Description BRONCHIAL ALVEOLAR LAVAGE  Final   Special Requests NONE  Final   Gram Stain   Final    NO WBC SEEN NO ORGANISMS SEEN Performed at [facility], [address], [place], [place] [address]    Culture RARE CANDIDA ALBICANS  Final   Report Status 11/30/2023  FINAL  Final  Acid Fast Smear (AFB)     Status: None   Collection Time: 11/27/23  9:27 AM   Specimen: Bronchoalveolar Lavage; Respiratory  Result Value Ref Range Status   AFB Specimen Processing Concentration  Final   Acid Fast Smear Negative  Final    Comment: (NOTE) Performed At: [facility] [address] [place], [place] [id number] Jennette Shorter MD Ey:[id number]    Source (AFB) BRONCHIAL ALVEOLAR LAVAGE  Final    Comment: Performed at [facility], [address], [place], [place] [address]  Fungus Culture With Stain     Status: Abnormal   Collection Time: 11/27/23  9:27 AM   Specimen: Bronchoalveolar Lavage; Respiratory  Result Value Ref Range Status   Fungus Stain Final report  Final   Fungus (Mycology) Culture Preliminary report (A)  Final    Comment: (NOTE) Performed At: [facility] [address] [place], [place] [id number] Jennette Shorter MD Ey:[id number]    Fungal Source BRONCHIAL ALVEOLAR LAVAGE  Final    Comment: Performed at [facility], [address] [place], [place] [address]  Virus culture     Status: Abnormal   Collection Time: 11/27/23  9:27 AM   Specimen: Bronchoalveolar Lavage  Result Value Ref Range Status   Viral Culture Comment (A)  Final    Comment: (NOTE) Positive Cytomegalovirus detected by immunofluorescent monoclonal antibody staining in shell vial culture. Performed At: [facility] [address] [place], [place] [id number] Jennette Shorter MD Ey:[id number]    Source of Sample BRONCHIAL ALVEOLAR LAVAGE   Final    Comment: Performed at [facility], [address] [place], [place] [address]  Fungus Culture Result     Status: None   Collection Time: 11/27/23  9:27 AM  Result Value Ref Range Status   Result 1 Comment  Final    Comment: (NOTE) KOH/Calcofluor preparation:  no fungus observed. Performed At: [facility] [address] [place], [place] [id number] Jennette Shorter MD Ey:[id number]   Fungal organism reflex     Status: Abnormal   Collection Time: 11/27/23  9:27 AM  Result Value Ref Range Status   Fungal result 1 Candida albicans (A)  Final    Comment: (NOTE) Moderate growth Performed At: [facility] [address] [place], [place] [id number] Jennette Shorter MD Ey:[id number]   Culture, blood (Routine X 2) w Reflex to ID Panel     Status: None   Collection Time: 12/01/23 12:43 PM   Specimen: BLOOD  Result Value Ref Range Status   Specimen Description BLOOD SITE NOT SPECIFIED  Final   Special Requests   Final    BOTTLES DRAWN AEROBIC AND ANAEROBIC Blood Culture adequate volume   Culture   Final    NO GROWTH 5 DAYS Performed at [facility], [address], [place], [place] [address]    Report Status 12/06/2023 FINAL  Final  Culture, blood (Routine X 2) w Reflex to ID Panel     Status: None   Collection Time: 12/01/23 12:44 PM   Specimen: BLOOD  Result Value Ref Range Status   Specimen Description BLOOD SITE NOT SPECIFIED  Final   Special Requests   Final    BOTTLES DRAWN AEROBIC AND ANAEROBIC Blood Culture adequate volume   Culture   Final    NO GROWTH 5 DAYS Performed at [facility], [address] [place], [place] [address]    Report Status 12/06/2023 FINAL  Final  Culture, Respiratory w Gram Stain     Status: None   Collection Time: 12/05/23 11:40 AM   Specimen: Bronchoalveolar Lavage; Respiratory  Result Value Ref Range Status   Specimen Description BRONCHIAL ALVEOLAR LAVAGE  Final   Special Requests NONE  Final   Gram  Stain   Final    RARE WBC PRESENT, PREDOMINANTLY MONONUCLEAR NO ORGANISMS SEEN Performed at [facility], [address] [place], [place] [address]    Culture RARE CANDIDA ALBICANS  Final   Report Status 12/07/2023 FINAL  Final  Acid Fast Smear (AFB)     Status: None   Collection Time: 12/05/23 11:40 AM   Specimen: Bronchoalveolar Lavage; Respiratory  Result Value Ref Range Status   AFB Specimen Processing Concentration  Final   Acid Fast Smear Negative  Final    Comment: (NOTE) Performed At: [facility] [address] [place], [place] [id number] Jennette Shorter MD Ey:[id number]    Source (AFB) BRONCHIAL ALVEOLAR LAVAGE  Final    Comment: Performed at [facility], [address] [place], [place] [address]  Fungus Culture With Stain     Status: Abnormal (Preliminary result)   Collection Time: 12/05/23 11:40 AM   Specimen: Bronchoalveolar Lavage; Respiratory  Result Value Ref Range Status   Fungus Stain Final report (A)  Final    Comment: (NOTE) Performed At: [facility] [address] [place], [place] [id number] Jennette Shorter MD Ey:[id number]    Fungus (Mycology) Culture PENDING  Incomplete   Fungal Source BRONCHIAL ALVEOLAR LAVAGE  Final    Comment: Performed at [facility], [address] [place], [place] [address]  Fungus Culture Result     Status: Abnormal   Collection Time: 12/05/23 11:40 AM  Result Value Ref Range Status   Result 1 Comment (A)  Final    Comment: (NOTE) Fungal elements, such as arthroconidia, hyphal fragments, chlamydoconidia, observed. Performed At: [facility] [address] [place], [place] [id number] Jennette Shorter MD Ey:[id number]   Culture, Respiratory w Gram Stain     Status: None   Collection Time: 12/09/23  4:05 PM   Specimen: Tracheal Aspirate; Respiratory  Result Value Ref Range Status   Specimen Description TRACHEAL ASPIRATE  Final   Special Requests NONE  Final   Gram Stain   Final     RARE WBC PRESENT, PREDOMINANTLY PMN FEW GRAM POSITIVE COCCI RARE GRAM NEGATIVE RODS Performed at [facility], [address] [place], [place] [address]    Culture   Final    ABUNDANT METHICILLIN RESISTANT STAPHYLOCOCCUS AUREUS   Report Status 12/11/2023 FINAL  Final   Organism ID, Bacteria METHICILLIN RESISTANT STAPHYLOCOCCUS AUREUS  Final      Susceptibility   Methicillin resistant staphylococcus aureus - MIC*    CIPROFLOXACIN >=8 RESISTANT Resistant     ERYTHROMYCIN >=8 RESISTANT Resistant     GENTAMICIN <=0.5 SENSITIVE Sensitive     OXACILLIN >=4 RESISTANT Resistant     TETRACYCLINE >=16 RESISTANT Resistant     VANCOMYCIN  1 SENSITIVE Sensitive     TRIMETH /SULFA  >=320 RESISTANT Resistant     CLINDAMYCIN  >=8 RESISTANT Resistant     RIFAMPIN <=0.5 SENSITIVE Sensitive     Inducible Clindamycin  NEGATIVE Sensitive     LINEZOLID  2 SENSITIVE Sensitive     * ABUNDANT METHICILLIN RESISTANT STAPHYLOCOCCUS AUREUS    Studies/Results: DG CHEST PORT 1 VIEW Result Date: 12/11/2023 CLINICAL DATA:  Acute on chronic respiratory failure EXAM: PORTABLE CHEST 1  VIEW COMPARISON:  Prior chest x-ray 12/08/2023 FINDINGS: Tracheostomy tube in place. The tip is midline and at the level of the clavicles. A feeding tube is present. The tip of the tube overlies the pylorus. Left-sided chest tube in unchanged position. Similar appearance of diffuse subcutaneous emphysema which is perhaps slightly progressed. Probable pneumomediastinum. No residual pneumothorax visualized. Decreased inspiratory volumes with increased perihilar streaky airspace opacities likely reflecting atelectasis. IMPRESSION: 1. Increasing subcutaneous emphysema and pneumomediastinum. 2. No evidence of residual pneumothorax. 3. Decreased lung volumes with increasing patchy airspace opacities in the right greater than left perihilar region. Airspace opacities may reflect areas of atelectasis, pneumonia or simply increased conspicuity of ARDS  in the setting of lower lung volumes. Electronically Signed   By: Wilkie Lent M.D.   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  His course has been complicated by pneumothorax fevers and ongoing respiratory failure that is ultimately required tracheostomy.  He has been on ganciclovir  for empiric treatment for possible CMV pneumonitis with CMV having been isolated on BAL and also with from peripheral blood  Note neither of those pieces of data makes diagnosis which typically requires a biopsy to prove in the lung  His CMV viral load went up prompting concern for ganciclovir  resistance.  We stopped GCV on Friday how he would do over the weekend.  He has had worsening fevers and some increased oxygen requirement up to 70% FiO2 though now down to 60%   #1 ? 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  Critical care was necessary to treat or prevent imminent or life-threatening deterioration.  Critical care was time spent personally by me on the following activities: development of treatment plan with  patient and/or surrogate as well as nursing, discussions with consultants, evaluation of patient's response to treatment, examination of patient, obtaining history from patient or surrogate, ordering and performing treatments and interventions, ordering and review of laboratory studies, ordering and review of radiographic studies, pulse oximetry and re-evaluation of patient's condition.  Evaluation of the patient requires complex antimicrobial therapy evaluation, counseling , isolation needs to reduce disease transmission and risk assessment and mitigation.      LOS: 33 days   Cory Russell 12/11/2023, 12:11 PM

## 2023-12-11 NOTE — Progress Notes (Signed)
 NAME:  Cory Russell, MRN:  [id number], DOB:  1991-02-14, LOS: 33 ADMISSION DATE:  11/08/2023, CONSULTATION DATE: 11/19/2023 REFERRING MD:  Jonel - TRH, CHIEF COMPLAINT:  Respiratory failure   History of Present Illness:  Cory Russell is a 33 y.o. man with past medical history of HIV/AIDS, CD4<35 on admission, not on ARVs. Presents with seizure like activity 11 days ago and found to have sepsis from pneumonia.  He notes symptoms started about 2-3 weeks ago with shortness of breath and dry cough. He had two separate rounds of antibiotics visiting other urgent care and ED visits. He has tried albuterol  nebulizer treatments from friends and initially found them helpful. CT Chest shows bilateral ground glass opacities concerning for pneumoncystis pneumonia. Since being here he has been Started on empiric antibiotics with bactrim  and steroids. Also resumed ARVs during this admission. Has been persistently hypoxemic on 10LNC. Pulmonary consulted to assist with evaluation and management and for bronchoscopy.   He feels persistently short of breath, worse with exertion. He feels like chest PT is helping him. Feeling more anxious. Also having difficulty taking pills.   Pertinent Medical History:  HIV, Seizure disorder  Significant Hospital Events: Including procedures, antibiotic start and stop dates in addition to other pertinent events   6/04 Admit 6/15 Pulmonary consult 6/18 Transient desats with worsening chest x-ray.  Prednisone  changed to IV Solu-Medrol  6/22 Worsening respiratory status requiring ICU transfer and intubation 6/23 Bronch for BAL per ID request. NO being weaned from 20 to 15. Overnight L PTX s/p chest tube. 6/25 Required transition to SIMV vent mode due to dyssynchrony. Sedation increased. Paralytic push given. CXR improved s/p diuresis. Required secondary L pigtail for persistent PTX.  6/26 Large bore chest tube placed, pigtail removed. 6/30 discussed plan for trach. Weaning  sedation  7/1 trach 7/2 weaning sedation. Chest tube inadvertently malfunctioned.   7/3 weaning sedation, vent  7/4 CMV viral load came back higher than before, question of resistance.  Ganciclovir  stopped by ID.  Gets tachypneic with dysfunctional breathing trial 7/5 spiked fever with Tmax 102.3, remained tachycardic in 120s  Interim History / Subjective:  Vent alarming and he is talking over it, but cuff remains inflated. Tmax 103.1  Objective:   Blood pressure (!) 90/57, pulse (!) 155, temperature (!) 100.8 F (38.2 C), temperature source Oral, resp. rate (!) 36, height 5' 7 (1.702 m), weight 37.7 kg, SpO2 97%.    Vent Mode: SIMV;PSV;PRVC FiO2 (%):  [40 %-70 %] 60 % Set Rate:  [18 bmp] 18 bmp Vt Set:  [410 mL] 410 mL PEEP:  [8 cmH20] 8 cmH20 Pressure Support:  [10 cmH20] 10 cmH20   Intake/Output Summary (Last 24 hours) at 12/11/2023 0845 Last data filed at 12/11/2023 0406 Gross per 24 hour  Intake 320 ml  Output 1950 ml  Net -1630 ml   Filed Weights   12/08/23 0300 12/09/23 0500 12/11/23 0500  Weight: 36.2 kg 37.3 kg 37.7 kg   Physical Examination: General: critically ill appearing man lying in bed in NAD HEENT: [place]/AT, eyes anicteric Neck: trach in place- sutured, no leak, no bleeding Neuro:awake, answering y/n question with nodding, globally weak but ale to pick up arms off the bed Chest: rhales bilaterally, + air leak from chest tube Heart: S1S2, tachycardic, reg rhythm Abdomen: soft, NT  Trach aspirate: abundant staph; S to linezolid , vanc, gent, rifampin AFB pending; smear negative Fungus culture: fungal elements present PCP smear pending  BUN 70 Cr 1.26 WBC 5.3 H/H 6.4/19.8 Platelets  206 CXR personally reviewed> Left chest tube with fully inflated lung, trach,  emphysema> worse since previous CXR. R midlung opacity.   Resolved Hospital problems  Probable severe PJP pneumonia Sepsis due to bilateral superimposed bacterial pneumonia with ARDS Acute septic  encephalopathy Hyponatremia Hyperkalemia   Assessment and Plan:  Acute respiratory failure with hypoxia and hypercapnia status post trach Left-sided pneumothorax and pneumomediastinum with subcutaneous emphysema status post chest tube Staph aureus pneumonia PJP pneumonia at presentation -con't chest tube to suction -AM CXR -LTVV -VAP prevention protocol -PAD protocol for sedation -vent weaning efforts as appropriate; very weak -some issues with trach leak, but with positioning was able to get adequate expiratory volumes -appropriate for LTAC; TOC consulted last week -trach care per protocol; can d/c sutures on POD 7  Sepsis due to recurrent bilateral multifocal pneumonia, now with Staph aureus HIV AIDS Probable IRIS> likely source of ongoing fevers CMV viremia/pneumonitis -off pressors -con't vanc; can hopefully deescalate to linezolid  when cultures come back -weekly high dose Azithromycin  -dapsone  for PJP prophylaxis -prezcobix   Sinus tachycardia due to infection, fevers -low dose metoprolol > limited by BP -supportive care  Cachexia, severe protein calorie malnutrition -TF  Seizure disorder -depakote   Anemia of critical illness -transfuse 1 unit pRBC today; has Ab so taking a while to cross match  Hyponatremia,   AKI due to septic ATN -strict I/O -renally dose meds, avoid nephrotoxic meds where able   Best Practice (right click and Reselect all SmartList Selections daily)   Diet/type: TF DVT prophylaxis prophylactic heparin   Pressure ulcer(s): N/A GI prophylaxis: H2B Lines: na Foley:  na Code Status:  full code Last date of multidisciplinary goals of care discussion: 7/6 mom updated at bedside   This patient is critically ill with multiple organ system failure which requires frequent high complexity decision making, assessment, support, evaluation, and titration of therapies. This was completed through the application of advanced monitoring technologies  and extensive interpretation of multiple databases. During this encounter critical care time was devoted to patient care services described in this note for 40 minutes.  Leita SHAUNNA Gaskins, DO 12/11/23 12:59 PM [facility]  For contact information, see Amion. If no response to pager, please call PCCM consult pager. After hours, 7PM- 7AM, please call Elink.

## 2023-12-11 NOTE — Progress Notes (Signed)
 Patient has not been able to be reached. This patient was first contacted 06/16/23. The patient has yet to respond or be seen by [facility]. 9 call attempts were made which included clinical and refill outreaches. Voicemail and MyChart message were also left for the patient. Disenrolling from the program due to Non-Compliance will re-enroll when patient establishes care with [facility]. 12/11/23 - CNS

## 2023-12-11 NOTE — Progress Notes (Signed)
 PT Cancellation Note  Patient Details Name: Cory Russell MRN: [id number] DOB: September 13, 1990   Cancelled Treatment:    Reason Eval/Treat Not Completed: Medical issues which prohibited therapy  Spoke with RN who reports pt getting tachy up into the 160s at rest. Unable to get beta blocker due to low BP.  Will hold physical therapy visit at this time and continue to follow/progress as tolerated.  Leontine Roads, PT, DPT [facility]  Rehabilitation Services Physical Therapist Office: [phone] Website: [email or web address]  Leontine GORMAN Roads 12/11/2023, 12:32 PM

## 2023-12-11 NOTE — TOC Progression Note (Signed)
 Transition of Care [facility]) - Progression Note    Patient Details  Name: Anav Lammert MRN: [id number] Date of Birth: Jun 08, 1990  Transition of [facility] CM/SW Contact  Isaiah Public, LCSWA Phone Number: 12/11/2023, 2:54 PM  Clinical Narrative:     Patient continues on vent, TOC will follow up for disposition needs.  Expected Discharge Plan: Home/Self Care Barriers to Discharge: Continued Medical Work up  Expected Discharge Plan and Services   Discharge Planning Services: CM Consult Post Acute Care Choice: NA Living arrangements for the past 2 months: Single Family Home                   DME Agency: NA       HH Arranged: NA           Social Determinants of Health (SDOH) Interventions SDOH Screenings   Food Insecurity: No Food Insecurity (11/08/2023)  Housing: Low Risk  (11/08/2023)  Transportation Needs: No Transportation Needs (11/08/2023)  Utilities: Not At Risk (11/08/2023)  Depression (PHQ2-9): Low Risk  (04/12/2023)  Financial Resource Strain: Low Risk  (02/27/2023)   Received from [facility]  Tobacco Use: Low Risk  (11/08/2023)    Readmission Risk Interventions     No data to display

## 2023-12-11 NOTE — Progress Notes (Signed)
 Patient has an appointment with Dr. Luiz on 01/01/24 scheduled and is currently admitted at the hospital. Hopefully will remain in care after discharge. Thanks Charmaine!

## 2023-12-11 NOTE — Progress Notes (Signed)
 Hypoglycemic Event  CBG: 22 @20 :06  Treatment: D50 50 mL (25 gm)  Symptoms: Sweaty  Follow-up CBG: Time:20:32 CBG Result:152  Possible Reasons for Event: Inadequate meal intake  Comments/MD notified:primary RN [name] to follow up    Cory Russell

## 2023-12-11 NOTE — Progress Notes (Signed)
 OT Cancellation Note  Patient Details Name: Cory Russell MRN: [id number] DOB: [birth year]   Cancelled Treatment:    Reason Eval/Treat Not Completed: Patient not medically ready (tachy 160 without medication available per RN to help with decrease if it continues to elevate)  Ely Molt 12/11/2023, 12:25 PM

## 2023-12-11 NOTE — Plan of Care (Signed)
 Palliative-   Chart reviewed. Noted pt with Hgb 6.4- no bleeding- to receive blood transfusion. Failed SBT this morning.  Goals have been clear for full scope, full code.  PMT will follow peripherally- please call if acute needs arise.   Cassondra Stain, AGNP-C Palliative Medicine  No charge

## 2023-12-12 ENCOUNTER — Inpatient Hospital Stay (HOSPITAL_COMMUNITY)

## 2023-12-12 DIAGNOSIS — B20 Human immunodeficiency virus [HIV] disease: Secondary | ICD-10-CM | POA: Diagnosis not present

## 2023-12-12 DIAGNOSIS — J9602 Acute respiratory failure with hypercapnia: Secondary | ICD-10-CM | POA: Diagnosis not present

## 2023-12-12 DIAGNOSIS — B250 Cytomegaloviral pneumonitis: Secondary | ICD-10-CM | POA: Diagnosis not present

## 2023-12-12 DIAGNOSIS — J15211 Pneumonia due to Methicillin susceptible Staphylococcus aureus: Secondary | ICD-10-CM | POA: Diagnosis not present

## 2023-12-12 DIAGNOSIS — J9601 Acute respiratory failure with hypoxia: Secondary | ICD-10-CM | POA: Diagnosis not present

## 2023-12-12 DIAGNOSIS — J939 Pneumothorax, unspecified: Secondary | ICD-10-CM | POA: Diagnosis not present

## 2023-12-12 DIAGNOSIS — J95851 Ventilator associated pneumonia: Secondary | ICD-10-CM | POA: Diagnosis not present

## 2023-12-12 LAB — POCT I-STAT 7, (LYTES, BLD GAS, ICA,H+H)
Acid-Base Excess: 6 mmol/L — ABNORMAL HIGH (ref 0.0–2.0)
Bicarbonate: 32.5 mmol/L — ABNORMAL HIGH (ref 20.0–28.0)
Calcium, Ion: 0.98 mmol/L — ABNORMAL LOW (ref 1.15–1.40)
HCT: 20 % — ABNORMAL LOW (ref 39.0–52.0)
Hemoglobin: 6.8 g/dL — CL (ref 13.0–17.0)
O2 Saturation: 95 %
Potassium: 4.9 mmol/L (ref 3.5–5.1)
Sodium: 134 mmol/L — ABNORMAL LOW (ref 135–145)
TCO2: 34 mmol/L — ABNORMAL HIGH (ref 22–32)
pCO2 arterial: 59.6 mmHg — ABNORMAL HIGH (ref 32–48)
pH, Arterial: 7.344 — ABNORMAL LOW (ref 7.35–7.45)
pO2, Arterial: 80 mmHg — ABNORMAL LOW (ref 83–108)

## 2023-12-12 LAB — PREPARE RBC (CROSSMATCH)

## 2023-12-12 LAB — CBC
HCT: 22.9 % — ABNORMAL LOW (ref 39.0–52.0)
HCT: 24.3 % — ABNORMAL LOW (ref 39.0–52.0)
Hemoglobin: 7.8 g/dL — ABNORMAL LOW (ref 13.0–17.0)
Hemoglobin: 8.3 g/dL — ABNORMAL LOW (ref 13.0–17.0)
MCH: 31 pg (ref 26.0–34.0)
MCH: 31 pg (ref 26.0–34.0)
MCHC: 34.1 g/dL (ref 30.0–36.0)
MCHC: 34.2 g/dL (ref 30.0–36.0)
MCV: 90.9 fL (ref 80.0–100.0)
MCV: 90.7 fL (ref 80.0–100.0)
Platelets: 149 K/uL — ABNORMAL LOW (ref 150–400)
Platelets: 150 K/uL (ref 150–400)
RBC: 2.52 MIL/uL — ABNORMAL LOW (ref 4.22–5.81)
RBC: 2.68 MIL/uL — ABNORMAL LOW (ref 4.22–5.81)
RDW: 17.1 % — ABNORMAL HIGH (ref 11.5–15.5)
RDW: 17.4 % — ABNORMAL HIGH (ref 11.5–15.5)
WBC: 1.1 K/uL — CL (ref 4.0–10.5)
WBC: 1 K/uL — CL (ref 4.0–10.5)
nRBC: 0 % (ref 0.0–0.2)
nRBC: 0 % (ref 0.0–0.2)

## 2023-12-12 LAB — BASIC METABOLIC PANEL WITH GFR
Anion gap: 15 (ref 5–15)
Anion gap: 15 (ref 5–15)
BUN: 128 mg/dL — ABNORMAL HIGH (ref 6–20)
BUN: 126 mg/dL — ABNORMAL HIGH (ref 6–20)
CO2: 29 mmol/L (ref 22–32)
CO2: 28 mmol/L (ref 22–32)
Calcium: 7.2 mg/dL — ABNORMAL LOW (ref 8.9–10.3)
Calcium: 7.1 mg/dL — ABNORMAL LOW (ref 8.9–10.3)
Chloride: 94 mmol/L — ABNORMAL LOW (ref 98–111)
Chloride: 93 mmol/L — ABNORMAL LOW (ref 98–111)
Creatinine, Ser: 3.62 mg/dL — ABNORMAL HIGH (ref 0.61–1.24)
Creatinine, Ser: 3.48 mg/dL — ABNORMAL HIGH (ref 0.61–1.24)
GFR, Estimated: 22 mL/min — ABNORMAL LOW (ref >60)
GFR, Estimated: 23 mL/min — ABNORMAL LOW (ref >60)
Glucose, Bld: 147 mg/dL — ABNORMAL HIGH (ref 70–99)
Glucose, Bld: 178 mg/dL — ABNORMAL HIGH (ref 70–99)
Potassium: 5.2 mmol/L — ABNORMAL HIGH (ref 3.5–5.1)
Potassium: 4.9 mmol/L (ref 3.5–5.1)
Sodium: 138 mmol/L (ref 135–145)
Sodium: 136 mmol/L (ref 135–145)

## 2023-12-12 LAB — GLUCOSE, CAPILLARY
Glucose-Capillary: 118 mg/dL — ABNORMAL HIGH (ref 70–99)
Glucose-Capillary: 133 mg/dL — ABNORMAL HIGH (ref 70–99)
Glucose-Capillary: 139 mg/dL — ABNORMAL HIGH (ref 70–99)
Glucose-Capillary: 149 mg/dL — ABNORMAL HIGH (ref 70–99)
Glucose-Capillary: 151 mg/dL — ABNORMAL HIGH (ref 70–99)
Glucose-Capillary: 174 mg/dL — ABNORMAL HIGH (ref 70–99)
Glucose-Capillary: 61 mg/dL — ABNORMAL LOW (ref 70–99)
Glucose-Capillary: 76 mg/dL (ref 70–99)
Glucose-Capillary: 80 mg/dL (ref 70–99)

## 2023-12-12 MED ORDER — FAMOTIDINE 20 MG PO TABS
20.0000 mg | ORAL_TABLET | Freq: Every day | ORAL | Status: DC
  Administered 2023-12-12 – 2023-12-13 (×2): 20 mg
  Filled 2023-12-12 (×2): qty 1

## 2023-12-12 MED ORDER — FUROSEMIDE 10 MG/ML IJ SOLN
100.0000 mg | Freq: Once | INTRAVENOUS | Status: AC
  Administered 2023-12-12: 100 mg via INTRAVENOUS
  Filled 2023-12-12: qty 10

## 2023-12-12 MED ORDER — DEXTROSE 50 % IV SOLN
12.5000 g | INTRAVENOUS | Status: AC
  Administered 2023-12-12: 12.5 g via INTRAVENOUS
  Filled 2023-12-12: qty 50

## 2023-12-12 NOTE — Progress Notes (Signed)
 NAME:  Cory Russell, MRN:  [id number], DOB:  [birth year], LOS: 34 ADMISSION DATE:  11/08/2023, CONSULTATION DATE: 11/19/2023 REFERRING MD:  Jonel - TRH, CHIEF COMPLAINT:  Respiratory failure   History of Present Illness:  Cory Russell is a 33 y.o. man with past medical history of HIV/AIDS, CD4<35 on admission, not on ARVs. Presents with seizure like activity 11 days ago and found to have sepsis from pneumonia.  He notes symptoms started about 2-3 weeks ago with shortness of breath and dry cough. He had two separate rounds of antibiotics visiting other urgent care and ED visits. He has tried albuterol  nebulizer treatments from friends and initially found them helpful. CT Chest shows bilateral ground glass opacities concerning for pneumoncystis pneumonia. Since being here he has been Started on empiric antibiotics with bactrim  and steroids. Also resumed ARVs during this admission. Has been persistently hypoxemic on 10LNC. Pulmonary consulted to assist with evaluation and management and for bronchoscopy.   He feels persistently short of breath, worse with exertion. He feels like chest PT is helping him. Feeling more anxious. Also having difficulty taking pills.   Pertinent Medical History:  HIV, Seizure disorder  Significant Hospital Events: Including procedures, antibiotic start and stop dates in addition to other pertinent events   6/04 Admit 6/15 Pulmonary consult 6/18 Transient desats with worsening chest x-ray.  Prednisone  changed to IV Solu-Medrol  6/22 Worsening respiratory status requiring ICU transfer and intubation 6/23 Bronch for BAL per ID request. NO being weaned from 20 to 15. Overnight L PTX s/p chest tube. 6/25 Required transition to SIMV vent mode due to dyssynchrony. Sedation increased. Paralytic push given. CXR improved s/p diuresis. Required secondary L pigtail for persistent PTX.  6/26 Large bore chest tube placed, pigtail removed. 6/30 discussed plan for trach. Weaning  sedation  7/1 trach 7/2 weaning sedation. Chest tube inadvertently malfunctioned.   7/3 weaning sedation, vent  7/4 CMV viral load came back higher than before, question of resistance.  Ganciclovir  stopped by ID.  Gets tachypneic with dysfunctional breathing trial 7/5 spiked fever with Tmax 102.3, remained tachycardic in 120s  Interim History / Subjective:  Overnight worse hypoxia per notes, but on less FiO2, tachycardia better with improvement in fever, although notes appear it was related to blood transfusion.   Objective:   Blood pressure (!) 101/58, pulse (!) 120, temperature 99 F (37.2 C), temperature source Axillary, resp. rate (!) 24, height 5' 7 (1.702 m), weight 37.3 kg, SpO2 96%.    Vent Mode: SIMV;PRVC;PSV FiO2 (%):  [50 %-70 %] 50 % Set Rate:  [18 bmp] 18 bmp Vt Set:  [410 mL] 410 mL PEEP:  [8 cmH20] 8 cmH20 Pressure Support:  [10 cmH20] 10 cmH20   Intake/Output Summary (Last 24 hours) at 12/12/2023 0726 Last data filed at 12/12/2023 0700 Gross per 24 hour  Intake 2139 ml  Output 200 ml  Net 1939 ml   Filed Weights   12/09/23 0500 12/11/23 0500 12/12/23 0600  Weight: 37.3 kg 37.7 kg 37.3 kg   Physical Examination: General: critically ill appearing man lying in bed in NAD HEENT [place]/AT, eyes anicteric Neck: trach in place, [place] emphysema in supraclavicular area Neuro: awake, not following commands, tracks Chest: breathing comfortably on MV, bilateral rhales. Left chest tube with intermittent air leak. Heart: S1S2, tachycardic, reg rhythm Abdomen: soft, NT  Trach aspirate: abundant staph; S to linezolid , vanc, gent, rifampin AFB pending; smear negative Fungus culture: fungal elements present PCP smear pending  BUN 128 Cr 1.26 WBC  1.1 H/H 6.4/19.8 Platelets 206 CXR personally reviewed> similar [place] emphysema, worse opacities bilaterally, mostly central. Left chest tube appropriately positioned.   Resolved Hospital problems  Probable severe PJP pneumonia Sepsis  due to bilateral superimposed bacterial pneumonia with ARDS Acute septic encephalopathy Hyponatremia Hyperkalemia   Assessment and Plan:  Acute respiratory failure with hypoxia and hypercapnia status post trach Left-sided pneumothorax and pneumomediastinum with subcutaneous emphysema status post chest tube Staph aureus pneumonia PJP pneumonia at presentation -repeat CXR due to concern for  emphysema from nurse; confirmed chest tube position and no recurrent pneumothorax.  -AM daily CXR -LTVV; vent pressures remains appropriate with peaks in low 20s.  -VAP prevention protocol -PAD protocol for sedation -daily SAT & SBT as appropriate -trach care per protocol; remove sutures after 7 days. No leak around trach today.  -con't vent weaning as able, but his profound weakness is a significant limitation -referral for LTAC placed; TOC consulted last week  Sepsis due to recurrent bilateral multifocal pneumonia, now with Staph aureus HIV AIDS Probable IRIS> likely source of ongoing fevers CMV viremia/pneumonitis Leukopenia without obvious cause-- would be very early to be a side effect of linezolid , and no significant thrombocytopenia -remains off pressors -bladder scan to ensure no retention causing AKI -con't linezolid  -dapsone  for PJP prophylaxis, high dose azithromycin  for MAC. -ID deciding if CMV requires treatment -prezcobix , biktarvy   Sinus tachycardia due to infection, fevers -low dose metoprolol  as tolerated with BP -additional pRBC given overnight for tachycardia  Cachexia, severe protein calorie malnutrition -con't TF  Seizure disorder -con't depakote   Anemia of critical illness -transfuse for Hb <7 or hemodynamically significant bleeding -monitor for bleeding  Hyponatremia, resolved -avoid hypotonic fluids  AKI due to septic ATN> worse today. Question if this was due to vancomycin  (Cr started to rise significantly the day after first dose of vanc) -strict  I/O -renally dose meds, avoid nephrotoxic meds  Debility due to critical illness associated weakness -needs PT, OT   Patient's mother updated at bedside today. She understands my worry about significant decline in renal function today.   Best Practice (right click and Reselect all SmartList Selections daily)   Diet/type: TF DVT prophylaxis prophylactic heparin   Pressure ulcer(s): N/A GI prophylaxis: H2B Lines: na Foley:  na Code Status:  full code Last date of multidisciplinary goals of care discussion: 7/8 mom updated at bedside   This patient is critically ill with multiple organ system failure which requires frequent high complexity decision making, assessment, support, evaluation, and titration of therapies. This was completed through the application of advanced monitoring technologies and extensive interpretation of multiple databases. During this encounter critical care time was devoted to patient care services described in this note for 45 minutes.  Leita SHAUNNA Gaskins, DO 12/12/23 11:36 AM [facility]  For contact information, see Amion. If no response to pager, please call PCCM consult pager. After hours, 7PM- 7AM, please call Elink.

## 2023-12-12 NOTE — Progress Notes (Signed)
 OT Cancellation Note  Patient Details Name: Cory Russell MRN: [id number] DOB: [birth year]   Cancelled Treatment:    Reason Eval/Treat Not Completed: (P) Patient not medically ready elevated HR 120s without additional medications to help with control at this time per discussion with RN  Ely Molt 12/12/2023, 10:31 AM

## 2023-12-12 NOTE — Progress Notes (Signed)
 Subjective:  Patient had some confusion this morning   Antibiotics:  Anti-infectives (From admission, onward)    Start     Dose/Rate Route Frequency Ordered Stop   12/11/23 1815  linezolid  (ZYVOX ) tablet 600 mg        600 mg Per Tube Every 12 hours 12/11/23 1722     12/11/23 1800  darunavir -cobicistat  (PREZCOBIX ) 800-150 MG per tablet 1 tablet        1 tablet Oral Daily with supper 12/11/23 0723     12/11/23 1400  vancomycin  (VANCOREADY) IVPB 500 mg/100 mL  Status:  Discontinued        500 mg 100 mL/hr over 60 Minutes Intravenous Every 24 hours 12/10/23 1238 12/11/23 1256   12/11/23 1345  linezolid  (ZYVOX ) IVPB 600 mg  Status:  Discontinued        600 mg 300 mL/hr over 60 Minutes Intravenous Every 12 hours 12/11/23 1257 12/11/23 1722   12/10/23 1330  vancomycin  (VANCOREADY) IVPB 750 mg/150 mL        750 mg 150 mL/hr over 60 Minutes Intravenous  Once 12/10/23 1238 12/10/23 1457   12/10/23 1315  vancomycin  (VANCOREADY) IVPB 500 mg/100 mL  Status:  Discontinued        500 mg 100 mL/hr over 60 Minutes Intravenous Every 24 hours 12/10/23 1226 12/10/23 1238   12/02/23 2000  azithromycin  (ZITHROMAX ) tablet 1,200 mg        1,200 mg Per Tube Weekly 11/26/23 1001     11/29/23 1700  ganciclovir  (CYTOVENE ) 250 mg in sodium chloride  0.9 % 100 mL IVPB  Status:  Discontinued        250 mg 100 mL/hr over 60 Minutes Intravenous Every 12 hours 11/29/23 0851 12/08/23 1437   11/29/23 1000  dapsone  tablet 100 mg        100 mg Per Tube Daily 11/28/23 1222     11/28/23 1800  ceFEPIme  (MAXIPIME ) 2 g in sodium chloride  0.9 % 100 mL IVPB        2 g 200 mL/hr over 30 Minutes Intravenous Every 8 hours 11/28/23 1253 12/02/23 1835   11/27/23 1200  clindamycin  (CLEOCIN ) IVPB 600 mg        600 mg 100 mL/hr over 30 Minutes Intravenous Every 6 hours 11/27/23 0909 11/28/23 2033   11/27/23 1030  linezolid  (ZYVOX ) tablet 600 mg        600 mg Per Tube Every 12 hours 11/27/23 0942 12/02/23 2103    11/27/23 1000  primaquine  tablet 30 mg        30 mg Per Tube Daily 11/27/23 0909 11/28/23 1100   11/27/23 0500  ganciclovir  (CYTOVENE ) 130 mg in sodium chloride  0.9 % 100 mL IVPB  Status:  Discontinued        2.5 mg/kg  51.2 kg 100 mL/hr over 60 Minutes Intravenous Every 12 hours 11/26/23 1612 11/26/23 1612   11/27/23 0500  ganciclovir  (CYTOVENE ) 125 mg in sodium chloride  0.9 % 100 mL IVPB  Status:  Discontinued        2.5 mg/kg  49.3 kg 100 mL/hr over 60 Minutes Intravenous Every 12 hours 11/26/23 1612 11/29/23 0851   11/26/23 1000  vancomycin  (VANCOCIN ) IVPB 1000 mg/200 mL premix        1,000 mg 200 mL/hr over 60 Minutes Intravenous  Once 11/26/23 0906 11/26/23 1116   11/26/23 0950  vancomycin  variable dose per unstable renal function (pharmacist dosing)  Status:  Discontinued  Does not apply See admin instructions 11/26/23 0950 11/27/23 0942   11/26/23 0945  ceFEPIme  (MAXIPIME ) 2 g in sodium chloride  0.9 % 100 mL IVPB  Status:  Discontinued        2 g 200 mL/hr over 30 Minutes Intravenous Every 12 hours 11/26/23 0849 11/28/23 1253   11/26/23 0200  ganciclovir  (CYTOVENE ) 255 mg in sodium chloride  0.9 % 100 mL IVPB  Status:  Discontinued        5 mg/kg  51.2 kg 100 mL/hr over 60 Minutes Intravenous Every 12 hours 11/26/23 0008 11/26/23 1612   11/24/23 1400  sulfamethoxazole -trimethoprim  (BACTRIM ) 320 mg of trimethoprim  in dextrose  5 % 500 mL IVPB  Status:  Discontinued        320 mg of trimethoprim  346.7 mL/hr over 90 Minutes Intravenous Every 8 hours 11/24/23 0917 11/27/23 0909   11/22/23 1200  sulfamethoxazole -trimethoprim  (BACTRIM ) 256 mg of trimethoprim  in dextrose  5 % 500 mL IVPB  Status:  Discontinued        15 mg/kg/day of trimethoprim   51.2 kg 344 mL/hr over 90 Minutes Intravenous Every 8 hours 11/22/23 0929 11/24/23 0917   11/20/23 1800  clindamycin  (CLEOCIN ) IVPB 600 mg  Status:  Discontinued        600 mg 100 mL/hr over 30 Minutes Intravenous Every 6 hours 11/20/23  1438 11/22/23 0929   11/20/23 1530  primaquine  tablet 30 mg  Status:  Discontinued        30 mg Oral Daily 11/20/23 1438 11/22/23 0929   11/18/23 2000  azithromycin  (ZITHROMAX ) tablet 1,200 mg  Status:  Discontinued        1,200 mg Oral Weekly 11/18/23 1907 11/26/23 1001   11/17/23 1500  sulfamethoxazole -trimethoprim  (BACTRIM ) 272.96 mg of trimethoprim  in dextrose  5 % 500 mL IVPB  Status:  Discontinued        15 mg/kg/day of trimethoprim   54.6 kg 344.7 mL/hr over 90 Minutes Intravenous Every 8 hours 11/17/23 1409 11/20/23 1437   11/16/23 1400  sulfamethoxazole -trimethoprim  (BACTRIM ) 200-40 MG/5ML suspension 30 mL  Status:  Discontinued        30 mL Oral Every 8 hours 11/16/23 0918 11/17/23 1409   11/12/23 2200  doxycycline  (VIBRA -TABS) tablet 100 mg  Status:  Discontinued        100 mg Oral Every 12 hours 11/12/23 1210 11/16/23 0914   11/09/23 1800  cefTRIAXone  (ROCEPHIN ) 2 g in sodium chloride  0.9 % 100 mL IVPB  Status:  Discontinued        2 g 200 mL/hr over 30 Minutes Intravenous Every 24 hours 11/09/23 1505 11/10/23 0946   11/08/23 1000  bictegravir-emtricitabine -tenofovir  AF (BIKTARVY ) 50-200-25 MG per tablet 1 tablet        1 tablet Oral Daily 11/08/23 0847     11/08/23 0930  sulfamethoxazole -trimethoprim  (BACTRIM ) 244.96 mg of trimethoprim  in dextrose  5 % 250 mL IVPB  Status:  Discontinued        15 mg/kg/day of trimethoprim   49 kg 265.3 mL/hr over 60 Minutes Intravenous Every 8 hours 11/08/23 0844 11/16/23 0918   11/08/23 0445  darunavir -cobicistat  (PREZCOBIX ) 800-150 MG per tablet 1 tablet  Status:  Discontinued        1 tablet Oral Daily with breakfast 11/08/23 0350 12/11/23 0723   11/08/23 0445  doxycycline  (VIBRAMYCIN ) 100 mg in sodium chloride  0.9 % 250 mL IVPB  Status:  Discontinued        100 mg 125 mL/hr over 120 Minutes Intravenous 2 times daily 11/08/23 0351 11/12/23 1210  11/08/23 0330  Ampicillin -Sulbactam (UNASYN ) 3 g in sodium chloride  0.9 % 100 mL IVPB  Status:   Discontinued        3 g 200 mL/hr over 30 Minutes Intravenous 2 times daily 11/08/23 0230 11/08/23 0236   11/08/23 0330  Ampicillin -Sulbactam (UNASYN ) 3 g in sodium chloride  0.9 % 100 mL IVPB  Status:  Discontinued        3 g 200 mL/hr over 30 Minutes Intravenous Every 6 hours 11/08/23 0236 11/09/23 1505       Medications: Scheduled Meds:  sodium chloride    Intravenous Once   arformoterol   15 mcg Nebulization BID   azithromycin   1,200 mg Per Tube Weekly   bictegravir-emtricitabine -tenofovir  AF  1 tablet Oral Daily   Chlorhexidine  Gluconate Cloth  6 each Topical Daily   clonazepam   1 mg Per Tube TID   dapsone   100 mg Per Tube Daily   darunavir -cobicistat   1 tablet Oral Q supper   famotidine   20 mg Per Tube Daily   fiber  1 packet Per Tube BID   heparin  injection (subcutaneous)  5,000 Units Subcutaneous Q8H   insulin  aspart  0-9 Units Subcutaneous Q4H   insulin  aspart  4 Units Subcutaneous Q4H   linezolid   600 mg Per Tube Q12H   metoprolol  tartrate  25 mg Per Tube Q8H   multivitamin with minerals  1 tablet Per Tube Daily   OLANZapine   5 mg Per Tube QHS   mouth rinse  15 mL Mouth Rinse Q2H   oxyCODONE   10 mg Per Tube Q6H   revefenacin   175 mcg Nebulization Daily   thiamine   100 mg Per Tube Daily   valproic  acid  500 mg Per Tube BID   Continuous Infusions:  feeding supplement (VITAL AF 1.2 CAL) 1,000 mL (12/12/23 0943)   PRN Meds:.acetaminophen , alum & mag hydroxide-simeth, docusate, hydrocortisone , hydrOXYzine , ipratropium-albuterol , melatonin, mouth rinse, oxyCODONE , polyethylene glycol, sodium chloride     Objective: Weight change: -0.4 kg  Intake/Output Summary (Last 24 hours) at 12/12/2023 1157 Last data filed at 12/12/2023 1130 Gross per 24 hour  Intake 1969 ml  Output 660 ml  Net 1309 ml   Blood pressure 107/65, pulse (!) 122, temperature 97.9 F (36.6 C), temperature source Axillary, resp. rate 18, height 5' 7 (1.702 m), weight 37.3 kg, SpO2 96%. Temp:  [97.9 F  (36.6 C)-101.4 F (38.6 C)] 97.9 F (36.6 C) (07/08 1100) Pulse Rate:  [119-155] 122 (07/08 1130) Resp:  [16-41] 18 (07/08 1130) BP: (73-119)/(42-69) 107/65 (07/08 1130) SpO2:  [88 %-98 %] 96 % (07/08 1130) FiO2 (%):  [50 %-60 %] 50 % (07/08 1052) Weight:  [37.3 kg] 37.3 kg (07/08 0600)  Physical Exam: Physical Exam Constitutional:      Appearance: He is cachectic.  HENT:     Head: Normocephalic and atraumatic.  Neck:     Trachea: Tracheostomy present.  Cardiovascular:     Rate and Rhythm: Normal rate.     Heart sounds: No murmur heard.    No friction rub. No gallop.  Pulmonary:     Breath sounds: Rhonchi present. No wheezing.  Abdominal:     General: There is no distension.  Musculoskeletal:        General: Normal range of motion.  Skin:    General: Skin is warm and dry.  Neurological:     General: No focal deficit present.     Mental Status: He is alert.      CBC:    BMET Recent  Labs    12/12/23 0328 12/12/23 0741  NA 138 136  K 5.2* 4.9  CL 94* 93*  CO2 29 28  GLUCOSE 147* 178*  BUN 128* 126*  CREATININE 3.62* 3.48*  CALCIUM  7.2* 7.1*     Liver Panel  No results for input(s): PROT, ALBUMIN, AST, ALT, ALKPHOS, BILITOT, BILIDIR, IBILI in the last 72 hours.     Sedimentation Rate No results for input(s): ESRSEDRATE in the last 72 hours. C-Reactive Protein No results for input(s): CRP in the last 72 hours.  Micro Results: Recent Results (from the past 720 hours)  Expectorated Sputum Assessment w Gram Stain, Rflx to Resp Cult     Status: None   Collection Time: 11/17/23  2:20 PM   Specimen: Expectorated Sputum  Result Value Ref Range Status   Specimen Description EXPECTORATED SPUTUM  Final   Special Requests Immunocompromised  Final   Sputum evaluation   Final    THIS SPECIMEN IS ACCEPTABLE FOR SPUTUM CULTURE Performed at [facility], [address], [place], [place] [address]    Report Status 11/17/2023 FINAL   Final  Culture, Respiratory w Gram Stain     Status: None   Collection Time: 11/17/23  2:20 PM  Result Value Ref Range Status   Specimen Description EXPECTORATED SPUTUM  Final   Special Requests Immunocompromised Reflexed from [id number]  Final   Gram Stain NO WBC SEEN FEW GRAM POSITIVE COCCI IN PAIRS   Final   Culture   Final    MODERATE Normal respiratory flora-no Staph aureus or Pseudomonas seen Performed at [facility], [address], [place], [place] [address]    Report Status 11/19/2023 FINAL  Final  Blastomyces Antigen     Status: None   Collection Time: 11/25/23  6:05 AM   Specimen: Blood  Result Value Ref Range Status   Blastomyces Antigen None Detected None Detected ng/mL Final    Comment: (NOTE) Reference Interval: None Detected Reportable Range: 0.31 ng/mL - 20.00 ng/mL Results above 20.00 ng/mL are reported as 'Positive, Above the Limit of Quantification' This test was developed and its performance characteristics determined by [employer] [facility]. It has not been cleared or approved by the FDA; however, FDA clearance or approval is not currently required for clinical use. The results are not intended to be used as the sole means for clinical diagnosis or patient decisions.    Interpretation Negative  Final   Specimen Type SERUM  Final    Comment: (NOTE) Performed At: [facility] [address] [place], [place] [id number] [place] Pac MD Ey:[id number]   MRSA Next Gen by PCR, Nasal     Status: Abnormal   Collection Time: 11/25/23 11:35 PM   Specimen: Nasal Mucosa; Nasal Swab  Result Value Ref Range Status   MRSA by PCR Next Gen DETECTED (A) NOT DETECTED Final    Comment: RESULT CALLED TO, READ BACK BY AND VERIFIED WITH: SHAFFNER,P RN 11/26/2023 AT 0218 SKEEN,P (NOTE) The GeneXpert MRSA Assay (FDA approved for NASAL specimens only), is one component of a comprehensive MRSA colonization surveillance program. It is not intended to  diagnose MRSA infection nor to guide or monitor treatment for MRSA infections. Test performance is not FDA approved in patients less than 54 years old. Performed at [facility], [address], [place], [place] [address]   Culture, Respiratory w Gram Stain     Status: None   Collection Time: 11/27/23  9:27 AM   Specimen:  Bronchoalveolar Lavage; Respiratory  Result Value Ref Range Status   Specimen Description BRONCHIAL ALVEOLAR LAVAGE  Final   Special Requests NONE  Final   Gram Stain   Final    NO WBC SEEN NO ORGANISMS SEEN Performed at [facility], [address], [place], [place] [address]    Culture RARE CANDIDA ALBICANS  Final   Report Status 11/30/2023 FINAL  Final  Acid Fast Smear (AFB)     Status: None   Collection Time: 11/27/23  9:27 AM   Specimen: Bronchoalveolar Lavage; Respiratory  Result Value Ref Range Status   AFB Specimen Processing Concentration  Final   Acid Fast Smear Negative  Final    Comment: (NOTE) Performed At: [facility] [address] [place], [place] [id number] Jennette Shorter MD Ey:[id number]    Source (AFB) BRONCHIAL ALVEOLAR LAVAGE  Final    Comment: Performed at [facility], [address], [place], [place] [address]  Fungus Culture With Stain     Status: Abnormal   Collection Time: 11/27/23  9:27 AM   Specimen: Bronchoalveolar Lavage; Respiratory  Result Value Ref Range Status   Fungus Stain Final report  Final   Fungus (Mycology) Culture Preliminary report (A)  Final    Comment: (NOTE) Performed At: [facility] [address] [place], [place] [id number] Jennette Shorter MD Ey:[id number]    Fungal Source BRONCHIAL ALVEOLAR LAVAGE  Final    Comment: Performed at [facility], [address], [place], [place] [address]  Virus culture     Status: Abnormal   Collection Time: 11/27/23  9:27 AM   Specimen: Bronchoalveolar Lavage  Result Value Ref Range Status   Viral Culture Comment (A)  Final     Comment: (NOTE) Positive Cytomegalovirus detected by immunofluorescent monoclonal antibody staining in shell vial culture. Performed At: [facility] [address] [place], [place] [id number] Jennette Shorter MD Ey:[id number]    Source of Sample BRONCHIAL ALVEOLAR LAVAGE  Final    Comment: Performed at [facility], [address] [place], [place] [address]  Fungus Culture Result     Status: None   Collection Time: 11/27/23  9:27 AM  Result Value Ref Range Status   Result 1 Comment  Final    Comment: (NOTE) KOH/Calcofluor preparation:  no fungus observed. Performed At: [facility] [address] [place], [place] [id number] Jennette Shorter MD Ey:[id number]   Fungal organism reflex     Status: Abnormal   Collection Time: 11/27/23  9:27 AM  Result Value Ref Range Status   Fungal result 1 Candida albicans (A)  Final    Comment: (NOTE) Moderate growth Performed At: [facility] [address] [place], [place] [id number] Jennette Shorter MD Ey:[id number]   Culture, blood (Routine X 2) w Reflex to ID Panel     Status: None   Collection Time: 12/01/23 12:43 PM   Specimen: BLOOD  Result Value Ref Range Status   Specimen Description BLOOD SITE NOT SPECIFIED  Final   Special Requests   Final    BOTTLES DRAWN AEROBIC AND ANAEROBIC Blood Culture adequate volume   Culture   Final    NO GROWTH 5 DAYS Performed at [facility], [address], [place], [place] [address]    Report Status 12/06/2023 FINAL  Final  Culture, blood (Routine X 2) w Reflex to ID Panel     Status: None   Collection Time: 12/01/23 12:44 PM   Specimen: BLOOD  Result Value Ref Range Status   Specimen Description BLOOD SITE NOT SPECIFIED  Final   Special Requests   Final    BOTTLES DRAWN AEROBIC AND ANAEROBIC Blood Culture adequate volume   Culture   Final    NO GROWTH 5 DAYS Performed at [facility], [address] [place], [place] [address]    Report Status  12/06/2023 FINAL  Final  Culture, Respiratory w Gram Stain     Status: None   Collection Time: 12/05/23 11:40 AM   Specimen: Bronchoalveolar Lavage; Respiratory  Result Value Ref Range Status   Specimen Description BRONCHIAL ALVEOLAR LAVAGE  Final   Special Requests NONE  Final   Gram Stain   Final    RARE WBC PRESENT, PREDOMINANTLY MONONUCLEAR NO ORGANISMS SEEN Performed at [facility], [address] [place], [place] [address]    Culture RARE CANDIDA ALBICANS  Final   Report Status 12/07/2023 FINAL  Final  Acid Fast Smear (AFB)     Status: None   Collection Time: 12/05/23 11:40 AM   Specimen: Bronchoalveolar Lavage; Respiratory  Result Value Ref Range Status   AFB Specimen Processing Concentration  Final   Acid Fast Smear Negative  Final    Comment: (NOTE) Performed At: [facility] [address] [place], [place] [id number] Jennette Shorter MD Ey:[id number]    Source (AFB) BRONCHIAL ALVEOLAR LAVAGE  Final    Comment: Performed at [facility], [address], [place], [place] [address]  Fungus Culture With Stain     Status: Abnormal (Preliminary result)   Collection Time: 12/05/23 11:40 AM   Specimen: Bronchoalveolar Lavage; Respiratory  Result Value Ref Range Status   Fungus Stain Final report (A)  Final    Comment: (NOTE) Performed At: [facility] [address] [place], [place] [id number] Jennette Shorter MD Ey:[id number]    Fungus (Mycology) Culture PENDING  Incomplete   Fungal Source BRONCHIAL ALVEOLAR LAVAGE  Final    Comment: Performed at [facility], [address], [place], [place] [address]  Fungus Culture Result     Status: Abnormal   Collection Time: 12/05/23 11:40 AM  Result Value Ref Range Status   Result 1 Comment (A)  Final    Comment: (NOTE) Fungal elements, such as arthroconidia, hyphal fragments, chlamydoconidia, observed. Performed At: [facility] [address] [place], [place] [id number] Jennette Shorter MD Ey:[id number]   Culture, Respiratory w Gram Stain     Status: None   Collection Time: 12/09/23  4:05 PM   Specimen: Tracheal Aspirate; Respiratory  Result Value Ref Range Status   Specimen Description TRACHEAL ASPIRATE  Final   Special Requests NONE  Final   Gram Stain   Final    RARE WBC PRESENT, PREDOMINANTLY PMN FEW GRAM POSITIVE COCCI RARE GRAM NEGATIVE RODS Performed at [facility], [address] [place], [place] [address]    Culture   Final    ABUNDANT METHICILLIN RESISTANT STAPHYLOCOCCUS AUREUS   Report Status 12/11/2023 FINAL  Final   Organism ID, Bacteria METHICILLIN RESISTANT STAPHYLOCOCCUS AUREUS  Final      Susceptibility   Methicillin resistant staphylococcus aureus - MIC*    CIPROFLOXACIN >=8 RESISTANT Resistant     ERYTHROMYCIN >=8 RESISTANT Resistant     GENTAMICIN <=0.5 SENSITIVE Sensitive     OXACILLIN >=4 RESISTANT Resistant     TETRACYCLINE >=16 RESISTANT Resistant     VANCOMYCIN  1 SENSITIVE Sensitive     TRIMETH /SULFA  >=320  RESISTANT Resistant     CLINDAMYCIN  >=8 RESISTANT Resistant     RIFAMPIN <=0.5 SENSITIVE Sensitive     Inducible Clindamycin  NEGATIVE Sensitive     LINEZOLID  2 SENSITIVE Sensitive     * ABUNDANT METHICILLIN RESISTANT STAPHYLOCOCCUS AUREUS    Studies/Results: DG CHEST PORT 1 VIEW Result Date: 12/12/2023 CLINICAL DATA:  HIV, chest crepitus EXAM: PORTABLE CHEST 1 VIEW COMPARISON:  12/12/2023 FINDINGS: Tracheostomy, nasoenteric feeding tube extending into the upper abdomen beyond the margin of the examination, and left chest tube are unchanged. Perihilar consolidation appears mildly progressive, likely infectious or inflammatory. Stable tiny right apical pneumothorax versus subpleural gas. No pneumothorax on the left. No pleural effusion. Cardiac size within normal limits. Extensive chest wall subcutaneous gas again noted, grossly stable IMPRESSION: 1. Stable support tubes. 2. Stable tiny right apical pneumothorax versus  subpleural gas. 3. Progressive perihilar consolidation, likely infectious or inflammatory. 4. Left chest tube in place. No pneumothorax. Stable extensive chest wall subcutaneous gas. Electronically Signed   By: Dorethia Molt M.D.   On: 12/12/2023 09:58   DG CHEST PORT 1 VIEW Result Date: 12/12/2023 CLINICAL DATA:  Hypoxemia. Chest tube, tracheostomy, airspace densities. EXAM: PORTABLE CHEST 1 VIEW COMPARISON:  12/11/2023 FINDINGS: A tracheostomy tube is present with tip measuring 4.3 cm above the carina. An enteric feeding tube is present with tip projecting over the right upper quadrant consistent with location in the distal stomach or proximal duodenum. A left chest tube is in place without change in position since prior study. Slightly shallow inspiration. Cardiac enlargement. Perihilar infiltrates, greater on the right demonstrate mild progression. No pleural effusion or pneumothorax is seen. Prominent subcutaneous emphysema in the neck and upper chest. Mild upper pneumo mediastinum. IMPRESSION: 1. Appliances appear in satisfactory position. 2. Cardiac enlargement. 3. Perihilar infiltrates, greater on the right, with mild progression since prior study. 4. Left chest tube remains in place without visible residual pneumothorax. 5. Extensive subcutaneous emphysema with mild pneumo mediastinum, similar to prior study. Electronically Signed   By: [name] Gravely M.D.   On: 12/12/2023 00:53   DG CHEST PORT 1 VIEW Result Date: 12/11/2023 CLINICAL DATA:  Acute on chronic respiratory failure EXAM: PORTABLE CHEST 1 VIEW COMPARISON:  Prior chest x-ray 12/08/2023 FINDINGS: Tracheostomy tube in place. The tip is midline and at the level of the clavicles. A feeding tube is present. The tip of the tube overlies the pylorus. Left-sided chest tube in unchanged position. Similar appearance of diffuse subcutaneous emphysema which is perhaps slightly progressed. Probable pneumomediastinum. No residual pneumothorax visualized.  Decreased inspiratory volumes with increased perihilar streaky airspace opacities likely reflecting atelectasis. IMPRESSION: 1. Increasing subcutaneous emphysema and pneumomediastinum. 2. No evidence of residual pneumothorax. 3. Decreased lung volumes with increasing patchy airspace opacities in the right greater than left perihilar region. Airspace opacities may reflect areas of atelectasis, pneumonia or simply increased conspicuity of ARDS in the setting of lower lung volumes. Electronically Signed   By: Wilkie Lent M.D.   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  His course has been complicated by pneumothorax fevers and ongoing respiratory failure that is ultimately required tracheostomy.  He has been on ganciclovir  for empiric treatment for possible CMV pneumonitis with CMV having been isolated on BAL and also with from peripheral blood  Note neither of those pieces of data makes diagnosis which typically requires a biopsy to prove in the lung  His CMV viral load  went up prompting concern for ganciclovir  resistance.  We stopped GCV on Friday how he would do over the weekend.  He has had worsening fevers and some increased oxygen requirement up to 70% FiO2 though now down to 60%  SInce then O2 requirements have improved but there was D[place]ing for subcutaneous emphysema and chest x-ray performed which did not show any recurrence of pneumothorax with chest tube in good position.  He is also developed acute renal failure in the context of potential urinary retention and also vancomycin  therapy he also has significant leukopenia that is sudden in onset as well he has been changed over to Zyvox  to treat his MRSA in the lungs   #1 ? CMV pneumonitis:  I am still of the opnion that we can hold off on returning to treat CMV at this point   #2 VAP MRSA: zyvox  is reasonable for now. Would be unusual to cause isolated leukopenia after a few doses. Another option would be teflaro   #3 Leukopenia: could this have been due to vancomycin   #4 ARF: likely multifactorial but agree with move away from vancomycin   #2  #3 HIV/AIDS:  Continue Biktarvy  and Prezcobix  and dapsone  for PCP prophylaxis as well as azithromycin  from [facility] and prophylaxis  CRITICAL CARE Performed by: Jomarie Salinas Dam   Total critical care time: 31 minutes  Critical care time was exclusive of separately billable procedures and treating other patients.  Critical care was necessary to treat or prevent imminent or life-threatening deterioration.  Critical care was time spent personally by me on the following activities: development of treatment plan with patient and/or surrogate as well as nursing, discussions with consultants, evaluation of patient's response to treatment, examination of patient, obtaining history from patient or surrogate, ordering and performing treatments and interventions, ordering and review of laboratory studies, ordering and review of radiographic studies, pulse  oximetry and re-evaluation of patient's condition.  Evaluation of the patient requires complex antimicrobial therapy evaluation, counseling , isolation needs to reduce disease transmission and risk assessment and mitigation.     LOS: 34 days   Jomarie Salinas Rothman 12/12/2023, 11:57 AM

## 2023-12-12 NOTE — Inpatient Diabetes Management (Signed)
 Inpatient Diabetes Program Recommendations  AACE/ADA: New Consensus Statement on Inpatient Glycemic Control (2015)  Target Ranges:  Prepandial:   less than 140 mg/dL      Peak postprandial:   less than 180 mg/dL (1-2 hours)      Critically ill patients:  140 - 180 mg/dL    Latest Reference Range & Units 12/10/23 23:01 12/11/23 03:21 12/11/23 07:24 12/11/23 11:40 12/11/23 13:29 12/11/23 15:28 12/11/23 20:04 12/11/23 20:06 12/11/23 20:32  Glucose-Capillary 70 - 99 mg/dL 848 (H)  7 units Novolog   214 (H)  13 units Novolog   74 178 (H) 202 (H)  7 units Novolog   112 (H) 21 (LL) 22 (LL) 152 (H)  (LL): Data is critically low (H): Data is abnormally high  Latest Reference Range & Units 12/12/23 00:04 12/12/23 03:54  Glucose-Capillary 70 - 99 mg/dL 850 (H)  5 units Novolog   139 (H)  5 units Novolog    (H): Data is abnormally high   Current Orders: Novolog  Sensitive Correction Scale/ SSI (0-9 units) Q4 hours                           Novolog  4 units Q4 hours       Novolog  tube feed coverage reduced to 4 units Q4H yest  Per MAR, Tube feeds were Stopped at 4pm yesterday (held for 2 hours before and 2 hours after antiretroviral medication admin) TF were restarted at 9pm Note worsening renal function today (GFR down to 22)  Pt could be at risk for further Hypoglycemic events due to worsening renal function  May consider:  1. Reduce the Novolog  SSI to the 0-6 unit scale Q4H (very sensitive scale)  2. Reduce the Novolog  TF coverage to 2 units Q4 hours     --Will follow patient during hospitalization--  Adina Rudolpho Arrow RN, MSN, CDCES Diabetes Coordinator Inpatient Glycemic Control Team Team Pager: [phone] (8a-5p)

## 2023-12-12 NOTE — Progress Notes (Signed)
 Called to bedside for concern of worsening SQ edema and swelling at neck/ clavicle and upper chest.  RN reports pt more restless shaking head and seems less responsive  Thin trached pt on MV, currently frequently shaking head, getting appropriate TVs, PIP 22, no desaturations on 50% or change in hemodynamics.  More rales/ rhonchi on right, +bs in left Some SQ edema around trach collar/ clavicle, Left surgical CT to suction with cont airleak   P:  - repeat stat CXR and cont close monitoring      Lyle Pesa, MSN, AG-ACNP-BC [facility] 12/12/2023, 9:09 AM  See Amion for pager If no response to pager , please call [phone] until 7pm After 7:00 pm call Elink  [phone]

## 2023-12-12 NOTE — Progress Notes (Signed)
 PT Cancellation Note  Patient Details Name: Marq Rebello MRN: [id number] DOB: 11/09/1990   Cancelled Treatment:    Reason Eval/Treat Not Completed: Fatigue/lethargy limiting ability to participate  Spoke with nursing. Reports pt very lethargic, minimally responsive, barely following commands right now. Will plan to attempt follow-up tomorrow, hopeful that pt is able to participate more and maximize benefits from our visit.  Leontine Roads, PT, DPT [facility] Physical Therapist Office: [phone] Website: [email or web address]  Leontine GORMAN Roads 12/12/2023, 3:17 PM

## 2023-12-12 NOTE — Progress Notes (Incomplete)
 Cory Russell

## 2023-12-13 ENCOUNTER — Inpatient Hospital Stay (HOSPITAL_COMMUNITY)

## 2023-12-13 DIAGNOSIS — J9601 Acute respiratory failure with hypoxia: Secondary | ICD-10-CM | POA: Diagnosis not present

## 2023-12-13 DIAGNOSIS — J939 Pneumothorax, unspecified: Secondary | ICD-10-CM | POA: Diagnosis not present

## 2023-12-13 DIAGNOSIS — J15211 Pneumonia due to Methicillin susceptible Staphylococcus aureus: Secondary | ICD-10-CM | POA: Diagnosis not present

## 2023-12-13 DIAGNOSIS — J95851 Ventilator associated pneumonia: Secondary | ICD-10-CM | POA: Diagnosis not present

## 2023-12-13 DIAGNOSIS — J9602 Acute respiratory failure with hypercapnia: Secondary | ICD-10-CM | POA: Diagnosis not present

## 2023-12-13 DIAGNOSIS — B20 Human immunodeficiency virus [HIV] disease: Secondary | ICD-10-CM | POA: Diagnosis not present

## 2023-12-13 DIAGNOSIS — B250 Cytomegaloviral pneumonitis: Secondary | ICD-10-CM | POA: Diagnosis not present

## 2023-12-13 LAB — BASIC METABOLIC PANEL WITH GFR
Anion gap: 19 — ABNORMAL HIGH (ref 5–15)
BUN: 149 mg/dL — ABNORMAL HIGH (ref 6–20)
CO2: 27 mmol/L (ref 22–32)
Calcium: 6.5 mg/dL — ABNORMAL LOW (ref 8.9–10.3)
Chloride: 94 mmol/L — ABNORMAL LOW (ref 98–111)
Creatinine, Ser: 4.01 mg/dL — ABNORMAL HIGH (ref 0.61–1.24)
GFR, Estimated: 19 mL/min — ABNORMAL LOW (ref >60)
Glucose, Bld: 105 mg/dL — ABNORMAL HIGH (ref 70–99)
Potassium: 4.6 mmol/L (ref 3.5–5.1)
Sodium: 140 mmol/L (ref 135–145)

## 2023-12-13 LAB — TYPE AND SCREEN
ABO/RH(D): O POS
Antibody Screen: POSITIVE
DAT, IgG: POSITIVE
Unit division: 0
Unit division: 0

## 2023-12-13 LAB — BPAM RBC
Blood Product Expiration Date: 202508072359
ISSUE DATE / TIME: 202507071631
ISSUE DATE / TIME: 202507080047
ISSUE DATE / TIME: 202508072359
Unit Type and Rh: 202508072359
Unit Type and Rh: 202508072359
Unit Type and Rh: 5100
Unit Type and Rh: 5100

## 2023-12-13 LAB — GLUCOSE, CAPILLARY
Glucose-Capillary: 138 mg/dL — ABNORMAL HIGH (ref 70–99)
Glucose-Capillary: 143 mg/dL — ABNORMAL HIGH (ref 70–99)
Glucose-Capillary: 154 mg/dL — ABNORMAL HIGH (ref 70–99)
Glucose-Capillary: 169 mg/dL — ABNORMAL HIGH (ref 70–99)
Glucose-Capillary: 173 mg/dL — ABNORMAL HIGH (ref 70–99)
Glucose-Capillary: 194 mg/dL — ABNORMAL HIGH (ref 70–99)

## 2023-12-13 LAB — CBC
HCT: 22 % — ABNORMAL LOW (ref 39.0–52.0)
Hemoglobin: 7.7 g/dL — ABNORMAL LOW (ref 13.0–17.0)
MCH: 30.6 pg (ref 26.0–34.0)
MCHC: 35 g/dL (ref 30.0–36.0)
MCV: 87.3 fL (ref 80.0–100.0)
Platelets: 132 K/uL — ABNORMAL LOW (ref 150–400)
RBC: 2.52 MIL/uL — ABNORMAL LOW (ref 4.22–5.81)
RDW: 16.5 % — ABNORMAL HIGH (ref 11.5–15.5)
WBC: 1 K/uL — CL (ref 4.0–10.5)
nRBC: 0 % (ref 0.0–0.2)

## 2023-12-13 LAB — ACID FAST SMEAR (AFB, MYCOBACTERIA)

## 2023-12-13 MED ORDER — GERHARDT'S BUTT CREAM
TOPICAL_CREAM | Freq: Two times a day (BID) | CUTANEOUS | Status: DC
  Administered 2023-12-13 – 2023-12-14 (×2): 1 via TOPICAL
  Filled 2023-12-13: qty 60

## 2023-12-13 MED ORDER — SODIUM ZIRCONIUM CYCLOSILICATE 5 G PO PACK
5.0000 g | PACK | Freq: Once | ORAL | Status: AC
  Administered 2023-12-13: 5 g
  Filled 2023-12-13: qty 1

## 2023-12-13 MED ORDER — MIDODRINE HCL 5 MG PO TABS
5.0000 mg | ORAL_TABLET | Freq: Three times a day (TID) | ORAL | Status: DC
  Administered 2023-12-13 – 2023-12-16 (×10): 5 mg
  Filled 2023-12-13 (×10): qty 1

## 2023-12-13 MED ORDER — SODIUM CHLORIDE 0.9 % IV SOLN
200.0000 mg | Freq: Two times a day (BID) | INTRAVENOUS | Status: DC
  Administered 2023-12-13 – 2023-12-16 (×6): 200 mg via INTRAVENOUS
  Filled 2023-12-13 (×8): qty 10

## 2023-12-13 NOTE — Progress Notes (Signed)
 Subjective:  Was not able to have conversation with the patient but did with his mother today   Antibiotics:  Anti-infectives (From admission, onward)    Start     Dose/Rate Route Frequency Ordered Stop   12/13/23 1800  ceftaroline  (TEFLARO ) 200 mg in sodium chloride  0.9 % 100 mL IVPB        200 mg 110 mL/hr over 60 Minutes Intravenous Every 12 hours 12/13/23 0943     12/11/23 1815  linezolid  (ZYVOX ) tablet 600 mg  Status:  Discontinued        600 mg Per Tube Every 12 hours 12/11/23 1722 12/13/23 0943   12/11/23 1800  darunavir -cobicistat  (PREZCOBIX ) 800-150 MG per tablet 1 tablet        1 tablet Oral Daily with supper 12/11/23 0723     12/11/23 1400  vancomycin  (VANCOREADY) IVPB 500 mg/100 mL  Status:  Discontinued        500 mg 100 mL/hr over 60 Minutes Intravenous Every 24 hours 12/10/23 1238 12/11/23 1256   12/11/23 1345  linezolid  (ZYVOX ) IVPB 600 mg  Status:  Discontinued        600 mg 300 mL/hr over 60 Minutes Intravenous Every 12 hours 12/11/23 1257 12/11/23 1722   12/10/23 1330  vancomycin  (VANCOREADY) IVPB 750 mg/150 mL        750 mg 150 mL/hr over 60 Minutes Intravenous  Once 12/10/23 1238 12/10/23 1457   12/10/23 1315  vancomycin  (VANCOREADY) IVPB 500 mg/100 mL  Status:  Discontinued        500 mg 100 mL/hr over 60 Minutes Intravenous Every 24 hours 12/10/23 1226 12/10/23 1238   12/02/23 2000  azithromycin  (ZITHROMAX ) tablet 1,200 mg        1,200 mg Per Tube Weekly 11/26/23 1001     11/29/23 1700  ganciclovir  (CYTOVENE ) 250 mg in sodium chloride  0.9 % 100 mL IVPB  Status:  Discontinued        250 mg 100 mL/hr over 60 Minutes Intravenous Every 12 hours 11/29/23 0851 12/08/23 1437   11/29/23 1000  dapsone  tablet 100 mg        100 mg Per Tube Daily 11/28/23 1222     11/28/23 1800  ceFEPIme  (MAXIPIME ) 2 g in sodium chloride  0.9 % 100 mL IVPB        2 g 200 mL/hr over 30 Minutes Intravenous Every 8 hours 11/28/23 1253 12/02/23 1835   11/27/23 1200   clindamycin  (CLEOCIN ) IVPB 600 mg        600 mg 100 mL/hr over 30 Minutes Intravenous Every 6 hours 11/27/23 0909 11/28/23 2033   11/27/23 1030  linezolid  (ZYVOX ) tablet 600 mg        600 mg Per Tube Every 12 hours 11/27/23 0942 12/02/23 2103   11/27/23 1000  primaquine  tablet 30 mg        30 mg Per Tube Daily 11/27/23 0909 11/28/23 1100   11/27/23 0500  ganciclovir  (CYTOVENE ) 130 mg in sodium chloride  0.9 % 100 mL IVPB  Status:  Discontinued        2.5 mg/kg  51.2 kg 100 mL/hr over 60 Minutes Intravenous Every 12 hours 11/26/23 1612 11/26/23 1612   11/27/23 0500  ganciclovir  (CYTOVENE ) 125 mg in sodium chloride  0.9 % 100 mL IVPB  Status:  Discontinued        2.5 mg/kg  49.3 kg 100 mL/hr over 60 Minutes Intravenous Every 12 hours 11/26/23 1612 11/29/23 0851  11/26/23 1000  vancomycin  (VANCOCIN ) IVPB 1000 mg/200 mL premix        1,000 mg 200 mL/hr over 60 Minutes Intravenous  Once 11/26/23 0906 11/26/23 1116   11/26/23 0950  vancomycin  variable dose per unstable renal function (pharmacist dosing)  Status:  Discontinued         Does not apply See admin instructions 11/26/23 0950 11/27/23 0942   11/26/23 0945  ceFEPIme  (MAXIPIME ) 2 g in sodium chloride  0.9 % 100 mL IVPB  Status:  Discontinued        2 g 200 mL/hr over 30 Minutes Intravenous Every 12 hours 11/26/23 0849 11/28/23 1253   11/26/23 0200  ganciclovir  (CYTOVENE ) 255 mg in sodium chloride  0.9 % 100 mL IVPB  Status:  Discontinued        5 mg/kg  51.2 kg 100 mL/hr over 60 Minutes Intravenous Every 12 hours 11/26/23 0008 11/26/23 1612   11/24/23 1400  sulfamethoxazole -trimethoprim  (BACTRIM ) 320 mg of trimethoprim  in dextrose  5 % 500 mL IVPB  Status:  Discontinued        320 mg of trimethoprim  346.7 mL/hr over 90 Minutes Intravenous Every 8 hours 11/24/23 0917 11/27/23 0909   11/22/23 1200  sulfamethoxazole -trimethoprim  (BACTRIM ) 256 mg of trimethoprim  in dextrose  5 % 500 mL IVPB  Status:  Discontinued        15 mg/kg/day of  trimethoprim   51.2 kg 344 mL/hr over 90 Minutes Intravenous Every 8 hours 11/22/23 0929 11/24/23 0917   11/20/23 1800  clindamycin  (CLEOCIN ) IVPB 600 mg  Status:  Discontinued        600 mg 100 mL/hr over 30 Minutes Intravenous Every 6 hours 11/20/23 1438 11/22/23 0929   11/20/23 1530  primaquine  tablet 30 mg  Status:  Discontinued        30 mg Oral Daily 11/20/23 1438 11/22/23 0929   11/18/23 2000  azithromycin  (ZITHROMAX ) tablet 1,200 mg  Status:  Discontinued        1,200 mg Oral Weekly 11/18/23 1907 11/26/23 1001   11/17/23 1500  sulfamethoxazole -trimethoprim  (BACTRIM ) 272.96 mg of trimethoprim  in dextrose  5 % 500 mL IVPB  Status:  Discontinued        15 mg/kg/day of trimethoprim   54.6 kg 344.7 mL/hr over 90 Minutes Intravenous Every 8 hours 11/17/23 1409 11/20/23 1437   11/16/23 1400  sulfamethoxazole -trimethoprim  (BACTRIM ) 200-40 MG/5ML suspension 30 mL  Status:  Discontinued        30 mL Oral Every 8 hours 11/16/23 0918 11/17/23 1409   11/12/23 2200  doxycycline  (VIBRA -TABS) tablet 100 mg  Status:  Discontinued        100 mg Oral Every 12 hours 11/12/23 1210 11/16/23 0914   11/09/23 1800  cefTRIAXone  (ROCEPHIN ) 2 g in sodium chloride  0.9 % 100 mL IVPB  Status:  Discontinued        2 g 200 mL/hr over 30 Minutes Intravenous Every 24 hours 11/09/23 1505 11/10/23 0946   11/08/23 1000  bictegravir-emtricitabine -tenofovir  AF (BIKTARVY ) 50-200-25 MG per tablet 1 tablet        1 tablet Oral Daily 11/08/23 0847     11/08/23 0930  sulfamethoxazole -trimethoprim  (BACTRIM ) 244.96 mg of trimethoprim  in dextrose  5 % 250 mL IVPB  Status:  Discontinued        15 mg/kg/day of trimethoprim   49 kg 265.3 mL/hr over 60 Minutes Intravenous Every 8 hours 11/08/23 0844 11/16/23 0918   11/08/23 0445  darunavir -cobicistat  (PREZCOBIX ) 800-150 MG per tablet 1 tablet  Status:  Discontinued  1 tablet Oral Daily with breakfast 11/08/23 0350 12/11/23 0723   11/08/23 0445  doxycycline  (VIBRAMYCIN ) 100 mg in  sodium chloride  0.9 % 250 mL IVPB  Status:  Discontinued        100 mg 125 mL/hr over 120 Minutes Intravenous 2 times daily 11/08/23 0351 11/12/23 1210   11/08/23 0330  Ampicillin -Sulbactam (UNASYN ) 3 g in sodium chloride  0.9 % 100 mL IVPB  Status:  Discontinued        3 g 200 mL/hr over 30 Minutes Intravenous 2 times daily 11/08/23 0230 11/08/23 0236   11/08/23 0330  Ampicillin -Sulbactam (UNASYN ) 3 g in sodium chloride  0.9 % 100 mL IVPB  Status:  Discontinued        3 g 200 mL/hr over 30 Minutes Intravenous Every 6 hours 11/08/23 0236 11/09/23 1505       Medications: Scheduled Meds:  sodium chloride    Intravenous Once   arformoterol   15 mcg Nebulization BID   azithromycin   1,200 mg Per Tube Weekly   bictegravir-emtricitabine -tenofovir  AF  1 tablet Oral Daily   Chlorhexidine  Gluconate Cloth  6 each Topical Daily   clonazepam   1 mg Per Tube TID   dapsone   100 mg Per Tube Daily   darunavir -cobicistat   1 tablet Oral Q supper   famotidine   20 mg Per Tube Daily   fiber  1 packet Per Tube BID   Gerhardt's butt cream   Topical BID   heparin  injection (subcutaneous)  5,000 Units Subcutaneous Q8H   insulin  aspart  0-9 Units Subcutaneous Q4H   metoprolol  tartrate  25 mg Per Tube Q8H   midodrine   5 mg Per Tube Q8H   multivitamin with minerals  1 tablet Per Tube Daily   OLANZapine   5 mg Per Tube QHS   mouth rinse  15 mL Mouth Rinse Q2H   oxyCODONE   10 mg Per Tube Q6H   revefenacin   175 mcg Nebulization Daily   thiamine   100 mg Per Tube Daily   valproic  acid  500 mg Per Tube BID   Continuous Infusions:  ceFTAROline  (TEFLARO ) IV     feeding supplement (VITAL AF 1.2 CAL) 70 mL/hr at 12/13/23 1500   PRN Meds:.acetaminophen , alum & mag hydroxide-simeth, docusate, hydrocortisone , hydrOXYzine , ipratropium-albuterol , melatonin, mouth rinse, oxyCODONE , polyethylene glycol, sodium chloride     Objective: Weight change:   Intake/Output Summary (Last 24 hours) at 12/13/2023 1548 Last data  filed at 12/13/2023 1500 Gross per 24 hour  Intake 977 ml  Output 380 ml  Net 597 ml   Blood pressure (!) 96/55, pulse (!) 104, temperature 98.9 F (37.2 C), temperature source Axillary, resp. rate 17, height 5' 7 (1.702 m), weight 37.3 kg, SpO2 96%. Temp:  [98.9 F (37.2 C)-102.7 F (39.3 C)] 98.9 F (37.2 C) (07/09 1100) Pulse Rate:  [98-132] 104 (07/09 1530) Resp:  [17-37] 17 (07/09 1530) BP: (84-135)/(46-79) 96/55 (07/09 1530) SpO2:  [94 %-98 %] 96 % (07/09 1530) FiO2 (%):  [50 %] 50 % (07/09 1200)  Physical Exam: Physical Exam Constitutional:      Appearance: He is cachectic.  HENT:     Head: Normocephalic and atraumatic.  Neck:     Trachea: Tracheostomy present.  Cardiovascular:     Rate and Rhythm: Tachycardia present.  Pulmonary:     Breath sounds: Rhonchi present. No wheezing.  Abdominal:     General: There is no distension.  Musculoskeletal:        General: Normal range of motion.  Skin:  General: Skin is warm and dry.  Neurological:     General: No focal deficit present.     Mental Status: He is alert.      CBC:    BMET Recent Labs    12/12/23 0741 12/13/23 0232  NA 136 140  K 4.9 4.6  CL 93* 94*  CO2 28 27  GLUCOSE 178* 105*  BUN 126* 149*  CREATININE 3.48* 4.01*  CALCIUM  7.1* 6.5*     Liver Panel  No results for input(s): PROT, ALBUMIN, AST, ALT, ALKPHOS, BILITOT, BILIDIR, IBILI in the last 72 hours.     Sedimentation Rate No results for input(s): ESRSEDRATE in the last 72 hours. C-Reactive Protein No results for input(s): CRP in the last 72 hours.  Micro Results: Recent Results (from the past 720 hours)  Expectorated Sputum Assessment w Gram Stain, Rflx to Resp Cult     Status: None   Collection Time: 11/17/23  2:20 PM   Specimen: Expectorated Sputum  Result Value Ref Range Status   Specimen Description EXPECTORATED SPUTUM  Final   Special Requests Immunocompromised  Final   Sputum evaluation   Final     THIS SPECIMEN IS ACCEPTABLE FOR SPUTUM CULTURE Performed at [facility], [address], [place], [place] [address]    Report Status 11/17/2023 FINAL  Final  Culture, Respiratory w Gram Stain     Status: None   Collection Time: 11/17/23  2:20 PM  Result Value Ref Range Status   Specimen Description EXPECTORATED SPUTUM  Final   Special Requests Immunocompromised Reflexed from [id number]  Final   Gram Stain NO WBC SEEN FEW GRAM POSITIVE COCCI IN PAIRS   Final   Culture   Final    MODERATE Normal respiratory flora-no Staph aureus or Pseudomonas seen Performed at [facility], [address], [place], [place] [address]    Report Status 11/19/2023 FINAL  Final  Blastomyces Antigen     Status: None   Collection Time: 11/25/23  6:05 AM   Specimen: Blood  Result Value Ref Range Status   Blastomyces Antigen None Detected None Detected ng/mL Final    Comment: (NOTE) Reference Interval: None Detected Reportable Range: 0.31 ng/mL - 20.00 ng/mL Results above 20.00 ng/mL are reported as 'Positive, Above the Limit of Quantification' This test was developed and its performance characteristics determined by [employer]. It has not been cleared or approved by the FDA; however, FDA clearance or approval is not currently required for clinical use. The results are not intended to be used as the sole means for clinical diagnosis or patient decisions.    Interpretation Negative  Final   Specimen Type SERUM  Final    Comment: (NOTE) Performed At: [facility] [address] [place], [place] [id number] [place] Pac MD Ey:[id number]   MRSA Next Gen by PCR, Nasal     Status: Abnormal   Collection Time: 11/25/23 11:35 PM   Specimen: Nasal Mucosa; Nasal Swab  Result Value Ref Range Status   MRSA by PCR Next Gen DETECTED (A) NOT DETECTED Final    Comment: RESULT CALLED TO, READ BACK BY AND VERIFIED WITH: SHAFFNER,P RN 11/26/2023 AT 0218 SKEEN,P (NOTE) The  GeneXpert MRSA Assay (FDA approved for NASAL specimens only), is one component of a comprehensive MRSA colonization surveillance program. It is not intended to diagnose MRSA infection nor to guide or monitor treatment for MRSA infections. Test performance is not FDA approved in patients less than 68 years old.  Performed at [facility], [address] [place], [place] [address]   Culture, Respiratory w Gram Stain     Status: None   Collection Time: 11/27/23  9:27 AM   Specimen: Bronchoalveolar Lavage; Respiratory  Result Value Ref Range Status   Specimen Description BRONCHIAL ALVEOLAR LAVAGE  Final   Special Requests NONE  Final   Gram Stain   Final    NO WBC SEEN NO ORGANISMS SEEN Performed at [facility], [address], [place], [place] [address]    Culture RARE CANDIDA ALBICANS  Final   Report Status 11/30/2023 FINAL  Final  Acid Fast Smear (AFB)     Status: None   Collection Time: 11/27/23  9:27 AM   Specimen: Bronchoalveolar Lavage; Respiratory  Result Value Ref Range Status   AFB Specimen Processing Concentration  Final   Acid Fast Smear Negative  Final    Comment: (NOTE) Performed At: [facility] [address] [place], [place] [id number] Jennette Shorter MD Ey:[id number]    Source (AFB) BRONCHIAL ALVEOLAR LAVAGE  Final    Comment: Performed at [facility], [address], [place], [place] [address]  Fungus Culture With Stain     Status: Abnormal   Collection Time: 11/27/23  9:27 AM   Specimen: Bronchoalveolar Lavage; Respiratory  Result Value Ref Range Status   Fungus Stain Final report  Final   Fungus (Mycology) Culture Preliminary report (A)  Final    Comment: (NOTE) Performed At: [facility] [address] [place], [place] [id number] Jennette Shorter MD Ey:[id number]    Fungal Source BRONCHIAL ALVEOLAR LAVAGE  Final    Comment: Performed at [facility], [address] [place], [place] [address]  Virus culture      Status: Abnormal   Collection Time: 11/27/23  9:27 AM   Specimen: Bronchoalveolar Lavage  Result Value Ref Range Status   Viral Culture Comment (A)  Final    Comment: (NOTE) Positive Cytomegalovirus detected by immunofluorescent monoclonal antibody staining in shell vial culture. Performed At: [facility] [address] [place], [place] [id number] Jennette Shorter MD Ey:[id number]    Source of Sample BRONCHIAL ALVEOLAR LAVAGE  Final    Comment: Performed at [facility], [address] [place], [place] [address]  Fungus Culture Result     Status: None   Collection Time: 11/27/23  9:27 AM  Result Value Ref Range Status   Result 1 Comment  Final    Comment: (NOTE) KOH/Calcofluor preparation:  no fungus observed. Performed At: [facility] [address] [place], [place] [id number] Jennette Shorter MD Ey:[id number]   Fungal organism reflex     Status: Abnormal   Collection Time: 11/27/23  9:27 AM  Result Value Ref Range Status   Fungal result 1 Candida albicans (A)  Final    Comment: (NOTE) Moderate growth Performed At: [facility] [address] [place], [place] [id number] Jennette Shorter MD Ey:[id number]   Culture, blood (Routine X 2) w Reflex to ID Panel     Status: None   Collection Time: 12/01/23 12:43 PM   Specimen: BLOOD  Result Value Ref Range Status   Specimen Description BLOOD SITE NOT SPECIFIED  Final   Special Requests   Final    BOTTLES DRAWN AEROBIC AND ANAEROBIC Blood Culture adequate volume   Culture   Final    NO GROWTH 5 DAYS Performed at [facility], [address] [place], [place] [address]  Report Status 12/06/2023 FINAL  Final  Culture, blood (Routine X 2) w Reflex to ID Panel     Status: None   Collection Time: 12/01/23 12:44 PM   Specimen: BLOOD  Result Value Ref Range Status   Specimen Description BLOOD SITE NOT SPECIFIED  Final   Special Requests   Final    BOTTLES DRAWN AEROBIC AND ANAEROBIC Blood  Culture adequate volume   Culture   Final    NO GROWTH 5 DAYS Performed at [facility], [address], [place], [place] [address]    Report Status 12/06/2023 FINAL  Final  Culture, Respiratory w Gram Stain     Status: None   Collection Time: 12/05/23 11:40 AM   Specimen: Bronchoalveolar Lavage; Respiratory  Result Value Ref Range Status   Specimen Description BRONCHIAL ALVEOLAR LAVAGE  Final   Special Requests NONE  Final   Gram Stain   Final    RARE WBC PRESENT, PREDOMINANTLY MONONUCLEAR NO ORGANISMS SEEN Performed at [facility], [address], [place], [place] [address]    Culture RARE CANDIDA ALBICANS  Final   Report Status 12/07/2023 FINAL  Final  Acid Fast Smear (AFB)     Status: None   Collection Time: 12/05/23 11:40 AM   Specimen: Bronchoalveolar Lavage; Respiratory  Result Value Ref Range Status   AFB Specimen Processing Concentration  Final   Acid Fast Smear Negative  Final    Comment: (NOTE) Performed At: [facility] [address] [place], [place] [id number] Jennette Shorter MD Ey:[id number]    Source (AFB) BRONCHIAL ALVEOLAR LAVAGE  Final    Comment: Performed at [facility], [address], [place], [place] [address]  Fungus Culture With Stain     Status: Abnormal (Preliminary result)   Collection Time: 12/05/23 11:40 AM   Specimen: Bronchoalveolar Lavage; Respiratory  Result Value Ref Range Status   Fungus Stain Final report (A)  Final    Comment: (NOTE) Performed At: [facility] [address] [place], [place] [id number] Jennette Shorter MD Ey:[id number]    Fungus (Mycology) Culture PENDING  Incomplete   Fungal Source BRONCHIAL ALVEOLAR LAVAGE  Final    Comment: Performed at [facility], [address], [place], [place] [address]  Fungus Culture Result     Status: Abnormal   Collection Time: 12/05/23 11:40 AM  Result Value Ref Range Status   Result 1 Comment (A)  Final    Comment: (NOTE) Fungal elements,  such as arthroconidia, hyphal fragments, chlamydoconidia, observed. Performed At: [facility] [address] [place], [place] [id number] Jennette Shorter MD Ey:[id number]   Acid Fast Smear (AFB)     Status: None   Collection Time: 12/09/23 11:00 AM   Specimen: Vein; Blood  Result Value Ref Range Status   AFB Specimen Processing Concentration  Final    Comment: (NOTE) Performed At: [facility] [address] [place], [place] [id number] Jennette Shorter MD Ey:[id number]    Acid Fast Smear QNSAFB  Final    Comment: (NOTE) Test not performed. AFB Smear not performed due to specimen source (blood) or insufficient specimen.    Source (AFB) AFB SUSCEPTIBILITY  Final    Comment: Performed at [facility], [address], [place], [place] [address]  Culture, Respiratory w Gram Stain     Status: None   Collection Time: 12/09/23  4:05 PM   Specimen: Tracheal Aspirate; Respiratory  Result Value Ref Range Status   Specimen Description TRACHEAL  ASPIRATE  Final   Special Requests NONE  Final   Gram Stain   Final    RARE WBC PRESENT, PREDOMINANTLY PMN FEW GRAM POSITIVE COCCI RARE GRAM NEGATIVE RODS Performed at [facility], [address] [place], [place] [address]    Culture   Final    ABUNDANT METHICILLIN RESISTANT STAPHYLOCOCCUS AUREUS   Report Status 12/11/2023 FINAL  Final   Organism ID, Bacteria METHICILLIN RESISTANT STAPHYLOCOCCUS AUREUS  Final      Susceptibility   Methicillin resistant staphylococcus aureus - MIC*    CIPROFLOXACIN >=8 RESISTANT Resistant     ERYTHROMYCIN >=8 RESISTANT Resistant     GENTAMICIN <=0.5 SENSITIVE Sensitive     OXACILLIN >=4 RESISTANT Resistant     TETRACYCLINE >=16 RESISTANT Resistant     VANCOMYCIN  1 SENSITIVE Sensitive     TRIMETH /SULFA  >=320 RESISTANT Resistant     CLINDAMYCIN  >=8 RESISTANT Resistant     RIFAMPIN <=0.5 SENSITIVE Sensitive     Inducible Clindamycin  NEGATIVE Sensitive     LINEZOLID  2 SENSITIVE  Sensitive     * ABUNDANT METHICILLIN RESISTANT STAPHYLOCOCCUS AUREUS    Studies/Results: DG CHEST PORT 1 VIEW Result Date: 12/13/2023 CLINICAL DATA:  [id number]. Chest tube in place. Follow-up airspace disease. EXAM: PORTABLE CHEST 1 VIEW COMPARISON:  Portable chest yesterday at 9:20 a.m. FINDINGS: 5:15 a.m. Tracheostomy tip remains 4.3 cm from the carina. Feeding tube well inside the stomach with the radiopaque tip out of view. There is a minimally small right apical pneumothorax, unchanged. There is extensive bilateral chest wall emphysema. Heart is slightly enlarged with prominence in the central vessels. There is right greater the left perihilar interstitial and airspace the disease consistent with edema, pneumonia or combination. There are minimal pleural effusions. There is no new or worsening airspace disease or measurable left pneumothorax. Left chest tube positioning is unaltered. The mediastinum is stable. No new osseous findings. Overall aeration is not significantly changed.  No new abnormality. IMPRESSION: 1. No significant change in aeration. 2. Minimally small right apical pneumothorax, unchanged. 3. Extensive bilateral chest wall emphysema. 4. Right greater than left perihilar interstitial and airspace disease consistent with edema, pneumonia or combination. 5. Minimal pleural effusions. 6. Stable support apparatus. Electronically Signed   By: Francis Quam M.D.   On: 12/13/2023 07:04   DG CHEST PORT 1 VIEW Result Date: 12/12/2023 CLINICAL DATA:  HIV, chest crepitus EXAM: PORTABLE CHEST 1 VIEW COMPARISON:  12/12/2023 FINDINGS: Tracheostomy, nasoenteric feeding tube extending into the upper abdomen beyond the margin of the examination, and left chest tube are unchanged. Perihilar consolidation appears mildly progressive, likely infectious or inflammatory. Stable tiny right apical pneumothorax versus subpleural gas. No pneumothorax on the left. No pleural effusion. Cardiac size within normal  limits. Extensive chest wall subcutaneous gas again noted, grossly stable IMPRESSION: 1. Stable support tubes. 2. Stable tiny right apical pneumothorax versus subpleural gas. 3. Progressive perihilar consolidation, likely infectious or inflammatory. 4. Left chest tube in place. No pneumothorax. Stable extensive chest wall subcutaneous gas. Electronically Signed   By: Dorethia Molt M.D.   On: 12/12/2023 09:58   DG CHEST PORT 1 VIEW Result Date: 12/12/2023 CLINICAL DATA:  Hypoxemia. Chest tube, tracheostomy, airspace densities. EXAM: PORTABLE CHEST 1 VIEW COMPARISON:  12/11/2023 FINDINGS: A tracheostomy tube is present with tip measuring 4.3 cm above the carina. An enteric feeding tube is present with tip projecting over the right upper quadrant consistent with location in the distal stomach or proximal duodenum. A left chest tube  is in place without change in position since prior study. Slightly shallow inspiration. Cardiac enlargement. Perihilar infiltrates, greater on the right demonstrate mild progression. No pleural effusion or pneumothorax is seen. Prominent subcutaneous emphysema in the neck and upper chest. Mild upper pneumo mediastinum. IMPRESSION: 1. Appliances appear in satisfactory position. 2. Cardiac enlargement. 3. Perihilar infiltrates, greater on the right, with mild progression since prior study. 4. Left chest tube remains in place without visible residual pneumothorax. 5. Extensive subcutaneous emphysema with mild pneumo mediastinum, similar to prior study. Electronically Signed   By: [name] Gravely M.D.   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  His course has been complicated by pneumothorax fevers and ongoing respiratory failure that is ultimately required tracheostomy.  He has been on ganciclovir  for empiric treatment for possible CMV pneumonitis with CMV having been isolated on BAL and also with from peripheral blood  Note neither of those pieces of data makes diagnosis which typically requires a biopsy to prove in the lung  His CMV viral load went up prompting concern for ganciclovir  resistance.  We stopped GCV on Friday how he would do over the weekend.  He has had worsening fevers and some increased oxygen requirement up to 70% FiO2 though now down to 50%  Interim he developed acute renal failure and leukopenia   #1 ? CMV pneumonitis:  I am still of the opnion that we can hold off on returning to treat CMV at this point   #2 VAP MRSA: Zyvox  would be unlikely to have dropped his white count so quickly but I think we can certainly switch to Teflaro  at present   ? This be a delayed effect of the GCV?  #3 Leukopenia: could this have been due to vancomycin   #4 ARF: has been fairly abrupt. He is still making urine but there is concern he will need HD  #4  HIV/AIDS:  Continue Biktarvy  and Prezcobix  as well as dapsone  for PCP prophylaxis and azithromycin .  Goals of Care: Understand in talking Dr. Gretta this morning that there really are no facilities that can take care  of patient to both need hemodialysis and tracheostomy in [place]  and that ultimately if he were to need long-term dialysis with the tracheostomy being in place he would likely need to be transferred to a facility outside of state.   CRITICAL CARE Performed by: Cory [place]   Total critical care time: 35 minutes  Critical care time was exclusive of separately billable procedures and treating other patients.  Critical care was necessary  to treat or prevent imminent or life-threatening deterioration.  Critical care was time spent personally by me on the following activities: development of treatment plan with patient and/or surrogate as well as nursing, discussions with consultants, evaluation of patient's response to treatment, examination of patient, obtaining history from patient or surrogate, ordering and performing treatments and interventions, ordering and review of laboratory studies, ordering and review of radiographic studies, pulse oximetry and re-evaluation of patient's condition.     LOS: 35 days   Cory Russell 12/13/2023, 3:48 PM

## 2023-12-13 NOTE — Progress Notes (Signed)
 Physical Therapy Treatment Patient Details Name: Cory Russell MRN: [id number] DOB: 04-09-1991 Today's Date: 12/13/2023   History of Present Illness Pt is a 33 yr old male who presented 11/08/23 with seizure-like activity. acute respiratory failure with hypoxia and hypercapnia in setting of severe bilateral multifocal pneumonia now status post trach 7/1. AIDs. PMH: HIV, seizure disorder, HTN, rectal bleeding, depression.    PT Comments  Pt demonstrating improved strength today and initially following commands. Working on sitting balance/posture and pt with decr responsiveness and quit following commands. VSS. Returned to supine. Rt gaze, frothy sputum, and not following commands. Nurse/MD aware.    If plan is discharge home, recommend the following: Two people to help with walking and/or transfers;Two people to help with bathing/dressing/bathroom;Assistance with cooking/housework;Direct supervision/assist for medications management;Direct supervision/assist for financial management;Assist for transportation;Assistance with feeding;Help with stairs or ramp for entrance;Supervision due to cognitive status   Can travel by private vehicle     No  Equipment Recommendations  Hoyer lift;Hospital bed;Wheelchair cushion (measurements PT);Wheelchair (measurements PT)    Recommendations for Other Services       Precautions / Restrictions Precautions Precautions: Fall;Other (comment) Recall of Precautions/Restrictions: Impaired Precaution/Restrictions Comments: vent, trach, watch O2 and HR Restrictions Weight Bearing Restrictions Per Provider Order: No     Mobility  Bed Mobility Overal bed mobility: Needs Assistance Bed Mobility: Supine to Sit, Sit to Supine     Supine to sit: +2 for physical assistance, Mod assist, HOB elevated Sit to supine: +2 for physical assistance, Total assist   General bed mobility comments: Assist to elevate trunk into sitting and bring hips to EOB. Assist for all  aspects returning to supine due to decr responsiveness    Transfers                        Ambulation/Gait                   Stairs             Wheelchair Mobility     Tilt Bed    Modified Rankin (Stroke Patients Only)       Balance Overall balance assessment: Needs assistance Sitting-balance support: Feet supported, Bilateral upper extremity supported, No upper extremity supported Sitting balance-Leahy Scale: Poor Sitting balance - Comments: Sat EOB x 7-8 minutes with min to max assist. Postural control: Posterior lean                                  Communication Communication Communication: Impaired Factors Affecting Communication: Trach/intubated  Cognition Arousal: Alert Behavior During Therapy: Restless, Flat affect   PT - Cognitive impairments: Difficult to assess Difficult to assess due to: Tracheostomy, Impaired communication                       Following commands: Impaired Following commands impaired: Follows one step commands with increased time, Follows one step commands inconsistently (Initially consistently following 1 step commands but then with decr responsiveness and would not follow commands)    Cueing Cueing Techniques: Verbal cues, Gestural cues, Tactile cues  Exercises General Exercises - Lower Extremity Ankle Circles/Pumps: AROM, AAROM, Both, 10 reps, Supine Heel Slides: AAROM, Both, 10 reps, Supine Straight Leg Raises: AAROM, Both, 5 reps, Supine    General Comments General comments (skin integrity, edema, etc.): VSS on vent 50% FiO2      Pertinent Vitals/Pain  Pain Assessment Pain Assessment: Faces Faces Pain Scale: No hurt    Home Living                          Prior Function            PT Goals (current goals can now be found in the care plan section) Progress towards PT goals: Progressing toward goals    Frequency    Min 2X/week      PT Plan       Co-evaluation PT/OT/SLP Co-Evaluation/Treatment: Yes Reason for Co-Treatment: Complexity of the patient's impairments (multi-system involvement);For patient/therapist safety PT goals addressed during session: Mobility/safety with mobility;Balance;Strengthening/ROM        AM-PAC PT 6 Clicks Mobility   Outcome Measure  Help needed turning from your back to your side while in a flat bed without using bedrails?: Total Help needed moving from lying on your back to sitting on the side of a flat bed without using bedrails?: Total Help needed moving to and from a bed to a chair (including a wheelchair)?: Total Help needed standing up from a chair using your arms (e.g., wheelchair or bedside chair)?: Total Help needed to walk in hospital room?: Total Help needed climbing 3-5 steps with a railing? : Total 6 Click Score: 6    End of Session Equipment Utilized During Treatment: Oxygen (vent) Activity Tolerance: Other (comment) (Limited by decr responsiveness mid treatment) Patient left: in bed;with call bell/phone within reach;with family/visitor present;with nursing/sitter in room Nurse Communication: Mobility status;Other (comment) (episode of shaking, decr responsiveness, rt eye gaze) PT Visit Diagnosis: Other abnormalities of gait and mobility (R26.89);Muscle weakness (generalized) (M62.81)     Time: 9099-9076 PT Time Calculation (min) (ACUTE ONLY): 23 min  Charges:    $Therapeutic Activity: 8-22 mins PT General Charges $$ ACUTE PT VISIT: 1 Visit                     [facility] PT Acute Rehabilitation Services Office [phone]    Rodgers ORN [facility] 12/13/2023, 9:44 AM

## 2023-12-13 NOTE — Progress Notes (Signed)
 SLP Cancellation Note  Patient Details Name: Jedd Schulenburg MRN: [id number] DOB: May 21, 1991   Cancelled treatment:       Reason Eval/Treat Not Completed: Patient not medically ready. Pt remains on vent, very weak. Alert this am, but then unresponsive to PT. Will f/u for readiness or needs   Drayce Tawil, Consuelo Fitch 12/13/2023, 10:39 AM

## 2023-12-13 NOTE — IPAL (Signed)
  Interdisciplinary Goals of Care Family Meeting   Date carried out: 12/13/2023  Location of the meeting: Bedside  Member's involved: Physician, Bedside Registered Nurse, and Family Member or next of kin  Durable Power of Attorney or acting medical decision maker: mother    Discussion: We discussed goals of care for Cory Russell .  We discussed that his lungs are about the same, but we now have significant renal failure. We're very near the point where dialysis would be the only option for him if his renal function does not turn around, and he may not tolerate this. We also briefly discussed that with HIV he has a risk of chronic renal failure and facilities that can care for HD and trach patients long-term are all out of state. His mother remains hopeful his renal function will improve and we agreed we will have to make a decision about dialysis in the next 1-2 days.  Code status:   Code Status: Full Code   Disposition: Continue current acute care  Time spent for the meeting: 10 min.    Cory SHAUNNA Gaskins, DO  12/13/2023, 9:30 AM

## 2023-12-13 NOTE — Progress Notes (Signed)
 NAME:  Kahlen Morais, MRN:  [id number], DOB:  [birth year], LOS: 35 ADMISSION DATE:  11/08/2023, CONSULTATION DATE: 11/19/2023 REFERRING MD:  Jonel - TRH, CHIEF COMPLAINT:  Respiratory failure   History of Present Illness:  Rumi Taras is a 33 y.o. man with past medical history of HIV/AIDS, CD4<35 on admission, not on ARVs. Presents with seizure like activity 11 days ago and found to have sepsis from pneumonia.  He notes symptoms started about 2-3 weeks ago with shortness of breath and dry cough. He had two separate rounds of antibiotics visiting other urgent care and ED visits. He has tried albuterol  nebulizer treatments from friends and initially found them helpful. CT Chest shows bilateral ground glass opacities concerning for pneumoncystis pneumonia. Since being here he has been Started on empiric antibiotics with bactrim  and steroids. Also resumed ARVs during this admission. Has been persistently hypoxemic on 10LNC. Pulmonary consulted to assist with evaluation and management and for bronchoscopy.   He feels persistently short of breath, worse with exertion. He feels like chest PT is helping him. Feeling more anxious. Also having difficulty taking pills.   Pertinent Medical History:  HIV, Seizure disorder  Significant Hospital Events: Including procedures, antibiotic start and stop dates in addition to other pertinent events   6/04 Admit 6/15 Pulmonary consult 6/18 Transient desats with worsening chest x-ray.  Prednisone  changed to IV Solu-Medrol  6/22 Worsening respiratory status requiring ICU transfer and intubation 6/23 Bronch for BAL per ID request. NO being weaned from 20 to 15. Overnight L PTX s/p chest tube. 6/25 Required transition to SIMV vent mode due to dyssynchrony. Sedation increased. Paralytic push given. CXR improved s/p diuresis. Required secondary L pigtail for persistent PTX.  6/26 Large bore chest tube placed, pigtail removed. 6/30 discussed plan for trach. Weaning  sedation  7/1 trach 7/2 weaning sedation. Chest tube inadvertently malfunctioned.   7/3 weaning sedation, vent  7/4 CMV viral load came back higher than before, question of resistance.  Ganciclovir  stopped by ID.  Gets tachypneic with dysfunctional breathing trial 7/5 spiked fever with Tmax 102.3, remained tachycardic in 120s  Interim History / Subjective:  No acute events overnight. Short attention span per PT. Tmax 102.7  Objective:   Blood pressure 113/66, pulse (!) 119, temperature (S) (!) 102.7 F (39.3 C), temperature source Oral, resp. rate (!) 24, height 5' 7 (1.702 m), weight 37.3 kg, SpO2 94%.    Vent Mode: SIMV;PSV;PRVC FiO2 (%):  [50 %] 50 % Set Rate:  [18 bmp] 18 bmp Vt Set:  [410 mL] 410 mL PEEP:  [8 cmH20] 8 cmH20 Pressure Support:  [10 cmH20] 10 cmH20   Intake/Output Summary (Last 24 hours) at 12/13/2023 0838 Last data filed at 12/13/2023 0400 Gross per 24 hour  Intake 757 ml  Output 1100 ml  Net -343 ml   Filed Weights   12/09/23 0500 12/11/23 0500 12/12/23 0600  Weight: 37.3 kg 37.7 kg 37.3 kg   Physical Examination: General: ill appearing man lying in bed on MV, awake but not very interactive HEENT [place]/AT, eyes anicteric Neck: tracheostomy in place Neuro: awake, tracks but not moving much. Not attempting to talk Chest: breathing comfortably on MV, rhales bilaterally, air leak from chest tube more brisk today Heart: S1S2, RRR Abdomen: soft, NT  Trach aspirate: abundant staph; S to linezolid , vanc, gent, rifampin AFB pending; smear negative Fungus culture: fungal elements present PCP smear pending  BUN 149 Cr 4.01 WBC 1.0 H/H 7.7/22 Platelets 132 CXR personally reviewed> [place] emphysema, L  chest tube in place, pneumothorax resolved. Improved pulmonary edema.   Resolved Hospital problems  Probable severe PJP pneumonia Sepsis due to bilateral superimposed bacterial pneumonia with ARDS Acute septic encephalopathy Hyponatremia Hyperkalemia    Assessment and Plan:  Acute respiratory failure with hypoxia and hypercapnia status post trach Left-sided pneumothorax and pneumomediastinum with subcutaneous emphysema status post chest tube MRSA pneumonia PJP pneumonia at presentation -chest tube to suction -daily CXR -LTVV -VAP prevention protocol -PAD protocol for sedation -vent weaning efforts have been limited; still has fairly high oxygen requirements -trach care per protocol  Sepsis due to recurrent bilateral multifocal pneumonia, now with MRSA HIV AIDS Probable IRIS> potential source of ongoing fevers CMV viremia, question possible pneumonitis Leukopenia without obvious cause-- would be very early to be a side effect of linezolid , and no significant thrombocytopenia -remains off vasopressors -d/w ID possibly changing antibiotics to ceftaroline  due to risk of leukopenia with linezolid  -dapsone  for PJP prophylaxis, high dose azithromycin  for MAC. -ID deciding if CMV requires treatment; worry quite a lot about worsening renal function with forcarnet -prezcobix , biktarvy   Sinus tachycardia due to infection, fevers; better with blood transfusions and antibiotics this week -low dose metoprolol  as tolerated -transfuse for Hb <7  Cachexia, severe protein calorie malnutrition -TF per RD recs  Seizure disorder -con't depakote   Anemia of critical illness -monitor for bleeding -transfuse for Hb <7 or hemodynamically significant bleeding  Hyponatremia, resolved -avoid hypotonic fluids  AKI due to septic ATN> worse today. Question if this was due to vancomycin  (Cr started to rise significantly the day after first dose of vanc) -strict I/O -renally dose meds, avoid nephrotoxic meds -not sure that HD would be well tolerated for him, but getting close to where he will have no option other than RRT for significantly worsening renal failure> d/w his mother today -replace foley today, although bladder scans have not indicated  retention  Debility due to critical illness associated weakness -PT, OT, SLP   Mr. Dino mother updated at bedside today.  Best Practice (right click and Reselect all SmartList Selections daily)   Diet/type: TF DVT prophylaxis prophylactic heparin   Pressure ulcer(s): N/A GI prophylaxis: H2B Lines: na Foley:  replaced Code Status:  full code Last date of multidisciplinary goals of care discussion: 7/9 mom updated at bedside   This patient is critically ill with multiple organ system failure which requires frequent high complexity decision making, assessment, support, evaluation, and titration of therapies. This was completed through the application of advanced monitoring technologies and extensive interpretation of multiple databases. During this encounter critical care time was devoted to patient care services described in this note for 44 minutes.  Leita SHAUNNA Gaskins, DO 12/13/23 12:34 PM [facility]  For contact information, see Amion. If no response to pager, please call PCCM consult pager. After hours, 7PM- 7AM, please call Elink.

## 2023-12-13 NOTE — Progress Notes (Signed)
 Occupational Therapy Treatment Patient Details Name: Cory Russell MRN: [id number] DOB: 11/22/1990 Today's Date: 12/13/2023   History of present illness Pt is a 33 yr old male who presented 11/08/23 with seizure-like activity. acute respiratory failure with hypoxia and hypercapnia in setting of severe bilateral multifocal pneumonia now status post trach 7/1. AIDs. PMH: HIV, seizure disorder, HTN, rectal bleeding, depression.   OT comments  Pt with dangled EOB sitting after coordination with RN for bed HR medically controlled HR to see patient. Pt with HR 109-118 during session so very controlled on full vent support with peep 8. Pt during sitting starting to R downward gaze not following commands and foam at the mouth. Pt returned to supine total +2 total (A) with RN and MD immediately at bedside. Pt becoming more responsive with return to supine. Recommendations updated to skilled inpatient follow up therapy, <3 hours/day vent support long term care.       If plan is discharge home, recommend the following:  Two people to help with walking and/or transfers;Two people to help with bathing/dressing/bathroom   Equipment Recommendations  Wheelchair (measurements OT);Wheelchair cushion (measurements OT);Hospital bed;Hoyer lift    Recommendations for Other Services      Precautions / Restrictions Precautions Precautions: Fall;Other (comment) Recall of Precautions/Restrictions: Impaired Precaution/Restrictions Comments: vent, trach, watch O2 and HR, chest tube L side to suction Restrictions Weight Bearing Restrictions Per Provider Order: No       Mobility Bed Mobility Overal bed mobility: Needs Assistance Bed Mobility: Supine to Sit, Sit to Supine     Supine to sit: +2 for physical assistance, Mod assist, HOB elevated Sit to supine: +2 for physical assistance, Total assist   General bed mobility comments: Assist to elevate trunk into sitting and bring hips to EOB. Assist for all aspects  returning to supine due to decr responsiveness. pt follows commands to slide BLE toward EOB with self initiation. pt requires complete (A) back to bed with R gaze foaming at mouth and not respondin to commands    Transfers                         Balance Overall balance assessment: Needs assistance Sitting-balance support: Feet supported, Bilateral upper extremity supported, No upper extremity supported Sitting balance-Leahy Scale: Poor Sitting balance - Comments: Sat EOB x 7-8 minutes with min to max assist. Postural control: Posterior lean                                 ADL either performed or assessed with clinical judgement   ADL Overall ADL's : Needs assistance/impaired     Grooming: Wash/dry face;Maximal assistance;Bed level (hand over hand, no attention to detail and terminates quickly)   Upper Body Bathing: Total assistance   Lower Body Bathing: Total assistance   Upper Body Dressing : Total assistance   Lower Body Dressing: Total assistance                 General ADL Comments: pt does turn foot to neutral position on command to have socks don by therapist.    Extremity/Trunk Assessment Upper Extremity Assessment Upper Extremity Assessment: Generalized weakness;Right hand dominant;RUE deficits/detail RUE Deficits / Details: decreased shoulder flexion to 50 degrees decrease grasp to hold a wash cloth but does have closed grasp RUE Coordination: decreased fine motor;decreased gross motor   Lower Extremity Assessment Lower Extremity Assessment: Generalized weakness;Defer to PT  evaluation        Vision       Perception     Praxis     Communication Communication Communication: Impaired Factors Affecting Communication: Trach/intubated   Cognition Arousal: Alert Behavior During Therapy: Restless, Flat affect Cognition: Difficult to assess Difficult to assess due to: Impaired communication, Intubated           OT -  Cognition Comments: following 1 step commands initially and not at the end of session                 Following commands: Impaired Following commands impaired: Follows one step commands with increased time, Follows one step commands inconsistently (Initially consistently following 1 step commands but then with decr responsiveness and would not follow commands)      Cueing   Cueing Techniques: Verbal cues, Gestural cues, Tactile cues  Exercises Other Exercises Other Exercises: reaching task BUE    Shoulder Instructions       General Comments VSS 50% peep 8    Pertinent Vitals/ Pain       Pain Assessment Pain Assessment: Faces Faces Pain Scale: No hurt  Home Living                                          Prior Functioning/Environment              Frequency  Min 1X/week        Progress Toward Goals  OT Goals(current goals can now be found in the care plan section)  Progress towards OT goals: Not progressing toward goals - comment  Acute Rehab OT Goals Patient Stated Goal: none stated OT Goal Formulation: Patient unable to participate in goal setting Time For Goal Achievement: 12/27/23 Potential to Achieve Goals: Fair ADL Goals Pt Will Perform Tub/Shower Transfer: with max assist;Squat pivot transfer;3 in 1 Additional ADL Goal #1: pt will demonstrates eob sitting min (A) for 10 minutes Additional ADL Goal #2: pt will follow 2 step command 50% of attemps  Plan      Co-evaluation    PT/OT/SLP Co-Evaluation/Treatment: Yes Reason for Co-Treatment: Complexity of the patient's impairments (multi-system involvement);For patient/therapist safety PT goals addressed during session: Mobility/safety with mobility;Balance;Strengthening/ROM OT goals addressed during session: ADL's and self-care;Strengthening/ROM      AM-PAC OT 6 Clicks Daily Activity     Outcome Measure   Help from another person eating meals?: Total Help from another  person taking care of personal grooming?: Total Help from another person toileting, which includes using toliet, bedpan, or urinal?: Total Help from another person bathing (including washing, rinsing, drying)?: Total Help from another person to put on and taking off regular upper body clothing?: Total Help from another person to put on and taking off regular lower body clothing?: Total 6 Click Score: 6    End of Session Equipment Utilized During Treatment: Oxygen  OT Visit Diagnosis: Unsteadiness on feet (R26.81);Muscle weakness (generalized) (M62.81)   Activity Tolerance Treatment limited secondary to medical complications (Comment) (decrease responsive and oral foaming)   Patient Left in bed;with call bell/phone within reach;with nursing/sitter in room;with family/visitor present;Other (comment);with SCD's reapplied (MD arriving at the end of session with RN)   Nurse Communication Mobility status;Need for lift equipment;Precautions        Time: 9096-9075 OT Time Calculation (min): 21 min  Charges: OT General Charges $OT Visit: 1 Visit OT  Treatments $Self Care/Home Management : 8-22 mins   Brynn, OTR/L  Acute Rehabilitation Services Office: [phone] .   Ely Molt 12/13/2023, 10:56 AM

## 2023-12-14 DIAGNOSIS — Z7189 Other specified counseling: Secondary | ICD-10-CM | POA: Diagnosis not present

## 2023-12-14 DIAGNOSIS — N179 Acute kidney failure, unspecified: Secondary | ICD-10-CM

## 2023-12-14 DIAGNOSIS — N19 Unspecified kidney failure: Secondary | ICD-10-CM

## 2023-12-14 DIAGNOSIS — J939 Pneumothorax, unspecified: Secondary | ICD-10-CM | POA: Diagnosis not present

## 2023-12-14 DIAGNOSIS — J95851 Ventilator associated pneumonia: Secondary | ICD-10-CM | POA: Diagnosis not present

## 2023-12-14 DIAGNOSIS — J9601 Acute respiratory failure with hypoxia: Secondary | ICD-10-CM | POA: Diagnosis not present

## 2023-12-14 DIAGNOSIS — J15211 Pneumonia due to Methicillin susceptible Staphylococcus aureus: Secondary | ICD-10-CM | POA: Diagnosis not present

## 2023-12-14 DIAGNOSIS — Z9911 Dependence on respirator [ventilator] status: Secondary | ICD-10-CM

## 2023-12-14 DIAGNOSIS — B20 Human immunodeficiency virus [HIV] disease: Secondary | ICD-10-CM | POA: Diagnosis not present

## 2023-12-14 DIAGNOSIS — J9602 Acute respiratory failure with hypercapnia: Secondary | ICD-10-CM | POA: Diagnosis not present

## 2023-12-14 DIAGNOSIS — J9600 Acute respiratory failure, unspecified whether with hypoxia or hypercapnia: Secondary | ICD-10-CM

## 2023-12-14 DIAGNOSIS — Z515 Encounter for palliative care: Secondary | ICD-10-CM | POA: Diagnosis not present

## 2023-12-14 DIAGNOSIS — B250 Cytomegaloviral pneumonitis: Secondary | ICD-10-CM | POA: Diagnosis not present

## 2023-12-14 LAB — BASIC METABOLIC PANEL WITH GFR
Anion gap: 24 — ABNORMAL HIGH (ref 5–15)
BUN: 188 mg/dL — ABNORMAL HIGH (ref 6–20)
CO2: 25 mmol/L (ref 22–32)
Calcium: 6.7 mg/dL — ABNORMAL LOW (ref 8.9–10.3)
Chloride: 89 mmol/L — ABNORMAL LOW (ref 98–111)
Creatinine, Ser: 5.51 mg/dL — ABNORMAL HIGH (ref 0.61–1.24)
GFR, Estimated: 13 mL/min — ABNORMAL LOW (ref >60)
Glucose, Bld: 227 mg/dL — ABNORMAL HIGH (ref 70–99)
Potassium: 4.4 mmol/L (ref 3.5–5.1)
Sodium: 138 mmol/L (ref 135–145)

## 2023-12-14 LAB — CBC
HCT: 20.9 % — ABNORMAL LOW (ref 39.0–52.0)
Hemoglobin: 7.3 g/dL — ABNORMAL LOW (ref 13.0–17.0)
MCH: 30.5 pg (ref 26.0–34.0)
MCHC: 34.9 g/dL (ref 30.0–36.0)
MCV: 87.4 fL (ref 80.0–100.0)
Platelets: 148 K/uL — ABNORMAL LOW (ref 150–400)
RBC: 2.39 MIL/uL — ABNORMAL LOW (ref 4.22–5.81)
RDW: 16.9 % — ABNORMAL HIGH (ref 11.5–15.5)
WBC: 0.7 K/uL — CL (ref 4.0–10.5)
nRBC: 2.7 % — ABNORMAL HIGH (ref 0.0–0.2)

## 2023-12-14 LAB — GLUCOSE, CAPILLARY
Glucose-Capillary: 150 mg/dL — ABNORMAL HIGH (ref 70–99)
Glucose-Capillary: 155 mg/dL — ABNORMAL HIGH (ref 70–99)
Glucose-Capillary: 176 mg/dL — ABNORMAL HIGH (ref 70–99)
Glucose-Capillary: 124 mg/dL — ABNORMAL HIGH (ref 70–99)
Glucose-Capillary: 134 mg/dL — ABNORMAL HIGH (ref 70–99)
Glucose-Capillary: 128 mg/dL — ABNORMAL HIGH (ref 70–99)

## 2023-12-14 MED ORDER — OXYCODONE HCL 5 MG PO TABS
5.0000 mg | ORAL_TABLET | Freq: Three times a day (TID) | ORAL | Status: DC
  Administered 2023-12-14: 5 mg
  Filled 2023-12-14: qty 1

## 2023-12-14 MED ORDER — CLONAZEPAM 0.5 MG PO TBDP
0.5000 mg | ORAL_TABLET | Freq: Two times a day (BID) | ORAL | Status: DC
  Administered 2023-12-14: 0.5 mg
  Filled 2023-12-14: qty 1

## 2023-12-14 MED ORDER — FAMOTIDINE 20 MG PO TABS
10.0000 mg | ORAL_TABLET | Freq: Every day | ORAL | Status: DC
  Administered 2023-12-14 – 2023-12-16 (×3): 10 mg
  Filled 2023-12-14 (×3): qty 1

## 2023-12-14 MED ORDER — SODIUM ZIRCONIUM CYCLOSILICATE 10 G PO PACK
10.0000 g | PACK | Freq: Once | ORAL | Status: AC
  Administered 2023-12-14: 10 g
  Filled 2023-12-14: qty 1

## 2023-12-14 NOTE — Progress Notes (Signed)
 NAME:  Cory Russell, MRN:  [id number], DOB:  05/11/1991, LOS: 36 ADMISSION DATE:  11/08/2023, CONSULTATION DATE: 11/19/2023 REFERRING MD:  Jonel - TRH, CHIEF COMPLAINT:  Respiratory failure   History of Present Illness:  Cory Russell is a 33 y.o. man with past medical history of HIV/AIDS, CD4<35 on admission, not on ARVs. Presents with seizure like activity 11 days ago and found to have sepsis from pneumonia.  He notes symptoms started about 2-3 weeks prior to admit with shortness of breath and dry cough. He had two separate rounds of antibiotics visiting other urgent care and ED visits. He has tried albuterol  nebulizer treatments from friends and initially found them helpful. CT Chest shows bilateral ground glass opacities concerning for pneumoncystis pneumonia. Since being here he has been Started on empiric antibiotics with bactrim  and steroids. Also resumed ARVs during this admission. Has been persistently hypoxemic on 10LNC. Pulmonary consulted to assist with evaluation and management and for bronchoscopy.   He feels persistently short of breath, worse with exertion. He feels like chest PT is helping him. Feeling more anxious. Also having difficulty taking pills.   Pertinent Medical History:  HIV, Seizure disorder  Significant Hospital Events: Including procedures, antibiotic start and stop dates in addition to other pertinent events   6/04 Admit 6/15 Pulmonary consult 6/18 Transient desats with worsening chest x-ray.  Prednisone  changed to IV Solu-Medrol  6/22 Worsening respiratory status requiring ICU transfer and intubation 6/23 Bronch for BAL per ID request. NO being weaned from 20 to 15. Overnight L PTX s/p chest tube. 6/25 Required transition to SIMV vent mode due to dyssynchrony. Sedation increased. Paralytic push given. CXR improved s/p diuresis. Required secondary L pigtail for persistent PTX.  6/26 Large bore chest tube placed, pigtail removed. 6/30 discussed plan for trach.  Weaning sedation  7/1 trach 7/2 weaning sedation. Chest tube inadvertently malfunctioned.   7/3 weaning sedation, vent  7/4 CMV viral load came back higher than before, question of resistance.  Ganciclovir  stopped by ID.  Gets tachypneic with dysfunctional breathing trial 7/5 spiked fever with Tmax 102.3, remained tachycardic in 120s  Interim History / Subjective:  Fever curve improved. No acute change overnight. Worsening renal function   Objective:   Blood pressure 105/60, pulse (!) 101, temperature 97.8 F (36.6 C), temperature source Axillary, resp. rate 17, height 5' 7 (1.702 m), weight 37.3 kg, SpO2 94%.    Vent Mode: PSV;CPAP FiO2 (%):  [50 %] 50 % Set Rate:  [18 bmp] 18 bmp Vt Set:  [410 mL] 410 mL PEEP:  [8 cmH20] 8 cmH20 Pressure Support:  [10 cmH20] 10 cmH20 Plateau Pressure:  [10 cmH20] 10 cmH20   Intake/Output Summary (Last 24 hours) at 12/14/2023 1117 Last data filed at 12/14/2023 1000 Gross per 24 hour  Intake 1453.34 ml  Output 25 ml  Net 1428.34 ml   Filed Weights   12/09/23 0500 12/11/23 0500 12/12/23 0600  Weight: 37.3 kg 37.7 kg 37.3 kg   Physical Examination: General: chronically ill appearing male, NAD in bed.  HEENT [place]/AT, eyes anicteric Neck: tracheostomy in place Neuro: somnolent, does not interact.  Chest: resps even non labored on PS 10/5. L CT with air leak  Heart: S1S2, RRR Abdomen: soft, NT   Resolved Hospital problems  Probable severe PJP pneumonia Sepsis due to bilateral superimposed bacterial pneumonia with ARDS Acute septic encephalopathy Hyponatremia Hyperkalemia   Assessment and Plan:  Acute respiratory failure with hypoxia and hypercapnia status post trach Left-sided pneumothorax and pneumomediastinum with  subcutaneous emphysema status post chest tube MRSA pneumonia PJP pneumonia at presentation -chest tube to suction -daily CXR -LTVV -VAP prevention protocol -PAD protocol for sedation -vent weaning efforts have been  limited; still has fairly high oxygen requirements -trach care per protocol - abx per ID as below   Sepsis due to recurrent bilateral multifocal pneumonia, now with MRSA HIV AIDS Probable IRIS> potential source of ongoing fevers CMV viremia, question possible pneumonitis Leukopenia without obvious cause-- would be very early to be a side effect of linezolid , and no significant thrombocytopenia -remains off vasopressors -ID following - linezolid  changed to ceftaroline  -dapsone  for PJP prophylaxis, high dose azithromycin  for MAC. -ID deciding if CMV requires treatment; holding for now with concern for worsening renal function with forcarnet -prezcobix , biktarvy   AKI due to septic ATN> worsening. Question if this was due to vancomycin  (Cr started to rise significantly the day after first dose of vanc) -strict I/O -renally dose meds, avoid nephrotoxic meds -not sure that HD would be well tolerated for him, but getting close to where he will have no option other than RRT for significantly worsening renal failure> also concern for long term issue [id number] with trach and HD which certainly should play role in ongoing goals of care discussions  -will ask nephrology to see   Sinus tachycardia due to infection, fevers; better with blood transfusions and antibiotics this week -low dose metoprolol  as tolerated -transfuse for Hb <7  Anxiety  AMS  -continue to wean klonazepam and scheduled oxy  -goal PRN   Cachexia, severe protein calorie malnutrition -TF per RD recs  Seizure disorder -con't depakote   Anemia of critical illness -monitor for bleeding -transfuse for Hb <7 or hemodynamically significant bleeding  Hyponatremia, resolved -avoid hypotonic fluids   Debility due to critical illness associated weakness -PT, OT, SLP   Mr. Fuelling uncle updated at bedside 7/10, mom (NOK) has stepped away but will return shortly. Needs ongoing goals of care discussion.  Best Practice  (right click and Reselect all SmartList Selections daily)   Diet/type: TF DVT prophylaxis prophylactic heparin   Pressure ulcer(s): N/A GI prophylaxis: H2B Lines: na Foley:  replaced Code Status:  full code Last date of multidisciplinary goals of care discussion: 7/9 mom updated at bedside   This patient is critically ill with multiple organ system failure which requires frequent high complexity decision making, assessment, support, evaluation, and titration of therapies. This was completed through the application of advanced monitoring technologies and extensive interpretation of multiple databases. During this encounter critical care time was devoted to patient care services described in this note for 32 minutes.  Rockie Myers, NP Pulmonary/Critical Care Medicine  12/14/2023  11:17 AM   See Tracey for personal pager PCCM on call pager [phone] until 7pm. Please call Elink 7p-7a. [phone]

## 2023-12-14 NOTE — IPAL (Signed)
  Interdisciplinary Goals of Care Family Meeting   Date carried out: 12/14/2023  Location of the meeting: Bedside  Member's involved: Physician, Bedside Registered Nurse, Family Member or next of kin, and Palliative care team member  Durable Power of Attorney or acting medical decision maker: mother Cory Russell    Discussion: We discussed goals of care for [name] .  I met with [name] mother Cory Russell and cousin at bedside to discuss his progressive renal failure. We have been watching this throughout the week, but we are at the point where we need to make some tough decisions. His body has continued to have new complications despite aggressive care-- renal failure, MRSA infection, still losing weight and poor absorption despite aggressive feeding strategy, ongoing debility. He has not made progress with vent weaning this week or with the air leak from his BPF. They understand that continuing to accumulate new problems is a worrisome sign that his body is not breaking down. We discussed the options of adding HD, which may be permanent, vs focusing more on his comfort if he would not want to be dependent on machines long-term. His mother wants more time to think about this and does not want to make a decision tonight. Our team will follow up tomorrow.   Code status:   Code Status: Full Code -- not addressed in tonight's meeting  Disposition: Continue current acute care  Time spent for the meeting: 22 min.    Cory SHAUNNA Gaskins, DO  12/14/2023, 5:15 PM

## 2023-12-14 NOTE — TOC Progression Note (Signed)
 Transition of Care [facility]) - Progression Note    Patient Details  Name: Cory Russell MRN: [id number] Date of Birth: 12-May-1991  [facility] CM/SW Contact  Isaiah Public, LCSWA Phone Number: 12/14/2023, 2:07 PM  Clinical Narrative:     Patient continues on vent,has cortrak. TOC will follow up for disposition needs.   Expected Discharge Plan: Home/Self Care Barriers to Discharge: Continued Medical Work up  Expected Discharge Plan and Services   Discharge Planning Services: CM Consult Post Acute Care Choice: NA Living arrangements for the past 2 months: Single Family Home                   DME Agency: NA       HH Arranged: NA           Social Determinants of Health (SDOH) Interventions SDOH Screenings   Food Insecurity: No Food Insecurity (11/08/2023)  Housing: Low Risk  (11/08/2023)  Transportation Needs: No Transportation Needs (11/08/2023)  Utilities: Not At Risk (11/08/2023)  Depression (PHQ2-9): Low Risk  (04/12/2023)  Financial Resource Strain: Low Risk  (02/27/2023)   [facility]  Tobacco Use: Low Risk  (11/08/2023)    Readmission Risk Interventions     No data to display

## 2023-12-14 NOTE — Progress Notes (Signed)
 Palliative Medicine Progress Note   Patient Name: Cory Russell       Date: 12/14/2023 DOB: 05/16/1991  Age: 33 y.o. MRN#: [id number] Attending Physician: Gretta Leita SQUIBB, DO Primary Care Physician: Luiz Channel, MD Admit Date: 11/08/2023   HPI/Patient Profile: 33 y.o. male  with past medical history of HIV/AIDS, CD4<35 on admission, not on ARVs who presented to the ED with seizure activity on 11/08/2023 and is admitted with acute respiratory failure with ARDS and severe bilateral multifocal pneumonia.    He completed treated for presumed PJP pneumonia and is now being treated for possible CMV pneumonitis.   Palliative Medicine has been consulted for goals of care discussions. Patient and family are faced with anticipatory care needs and complex medical decision making.   Subjective: Chart reviewed. Note worsening renal failure, with creatinine 5.51 today.   13:28 - Patient assessed. He is currently resting with his eyes closed. Weaning on the vent. No family at bedside.  Family Meeting: 17:20 - I returned to the bedside to meet with family (mom and uncle), along with Dr. Gretta, to discuss goals of care in the setting of patient's worsening renal failure. It was emphasized that his condition has continued to worsen despite aggressive care - kidney function, infection, and severe malnutrition despite aggressive tube feeds. Also discussed he has not made progress with vent weaning despite trach.   The difference between continued full scope medical intervention and comfort care was considered. Discussed that patient will need dialysis if the goal is for continued aggressive care. The alternative would be not starting dialysis and allowing a natural course to occur, with the goal is comfort and  dignity rather than prolonging life.   Mom is struggling with the decision regarding dialysis. She expresses not wanting to give up. I offered education on the limitations of medical interventions to prolong quality of life when the body is starting to fail. I also emphasized that not starting dialysis is not giving up, but instead allowing a natural death to occur from multi-organ failure.  Mom needs time to process, and is not able to make a decision tonight. We discussed need for a decision by tomorrow. Questions and concerns addressed. Emotional support provided.    Objective:  Physical Exam Vitals reviewed.  Constitutional:      General: He is sleeping.  He is not in acute distress.    Appearance: He is ill-appearing.  Cardiovascular:     Rate and Rhythm: Tachycardia present.  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)   Pneumonia due to Pneumocystis jirovecii (HCC)   Hypoxia   Healthcare associated bacterial pneumonia   Pneumothorax   IRIS (immune reconstitution inflammatory syndrome) (HCC)   Cytomegalovirus (CMV) viremia (HCC)   Fever   Fistula    Recommendations/Plan: Continue current scope of care Mom needs time to process, but will have a decision regarding dialysis by tomorrow Ongoing palliative support  Code Status: Full code  Prognosis:  Poor in the setting of multiorgan failure  Discharge Planning: To Be Determined   Thank you for allowing the Palliative Medicine Team to assist in the care of this patient.   MDM -  High   Recardo KATHEE Loll, NP   Please contact Palliative Medicine Team phone at [phone] for questions and concerns.  For individual providers, please see AMION.

## 2023-12-14 NOTE — Progress Notes (Addendum)
 Subjective:  He was last alert today when I rounded and mom had to go to an appointment in [place]   Antibiotics:  Anti-infectives (From admission, onward)    Start     Dose/Rate Route Frequency Ordered Stop   12/13/23 1800  ceftaroline  (TEFLARO ) 200 mg in sodium chloride  0.9 % 100 mL IVPB        200 mg 110 mL/hr over 60 Minutes Intravenous Every 12 hours 12/13/23 0943     12/11/23 1815  linezolid  (ZYVOX ) tablet 600 mg  Status:  Discontinued        600 mg Per Tube Every 12 hours 12/11/23 1722 12/13/23 0943   12/11/23 1800  darunavir -cobicistat  (PREZCOBIX ) 800-150 MG per tablet 1 tablet        1 tablet Oral Daily with supper 12/11/23 0723     12/11/23 1400  vancomycin  (VANCOREADY) IVPB 500 mg/100 mL  Status:  Discontinued        500 mg 100 mL/hr over 60 Minutes Intravenous Every 24 hours 12/10/23 1238 12/11/23 1256   12/11/23 1345  linezolid  (ZYVOX ) IVPB 600 mg  Status:  Discontinued        600 mg 300 mL/hr over 60 Minutes Intravenous Every 12 hours 12/11/23 1257 12/11/23 1722   12/10/23 1330  vancomycin  (VANCOREADY) IVPB 750 mg/150 mL        750 mg 150 mL/hr over 60 Minutes Intravenous  Once 12/10/23 1238 12/10/23 1457   12/10/23 1315  vancomycin  (VANCOREADY) IVPB 500 mg/100 mL  Status:  Discontinued        500 mg 100 mL/hr over 60 Minutes Intravenous Every 24 hours 12/10/23 1226 12/10/23 1238   12/02/23 2000  azithromycin  (ZITHROMAX ) tablet 1,200 mg        1,200 mg Per Tube Weekly 11/26/23 1001     11/29/23 1700  ganciclovir  (CYTOVENE ) 250 mg in sodium chloride  0.9 % 100 mL IVPB  Status:  Discontinued        250 mg 100 mL/hr over 60 Minutes Intravenous Every 12 hours 11/29/23 0851 12/08/23 1437   11/29/23 1000  dapsone  tablet 100 mg        100 mg Per Tube Daily 11/28/23 1222     11/28/23 1800  ceFEPIme  (MAXIPIME ) 2 g in sodium chloride  0.9 % 100 mL IVPB        2 g 200 mL/hr over 30 Minutes Intravenous Every 8 hours 11/28/23 1253 12/02/23 1835   11/27/23 1200   clindamycin  (CLEOCIN ) IVPB 600 mg        600 mg 100 mL/hr over 30 Minutes Intravenous Every 6 hours 11/27/23 0909 11/28/23 2033   11/27/23 1030  linezolid  (ZYVOX ) tablet 600 mg        600 mg Per Tube Every 12 hours 11/27/23 0942 12/02/23 2103   11/27/23 1000  primaquine  tablet 30 mg        30 mg Per Tube Daily 11/27/23 0909 11/28/23 1100   11/27/23 0500  ganciclovir  (CYTOVENE ) 130 mg in sodium chloride  0.9 % 100 mL IVPB  Status:  Discontinued        2.5 mg/kg  51.2 kg 100 mL/hr over 60 Minutes Intravenous Every 12 hours 11/26/23 1612 11/26/23 1612   11/27/23 0500  ganciclovir  (CYTOVENE ) 125 mg in sodium chloride  0.9 % 100 mL IVPB  Status:  Discontinued        2.5 mg/kg  49.3 kg 100 mL/hr over 60 Minutes Intravenous Every 12 hours 11/26/23 1612 11/29/23  9148   11/26/23 1000  vancomycin  (VANCOCIN ) IVPB 1000 mg/200 mL premix        1,000 mg 200 mL/hr over 60 Minutes Intravenous  Once 11/26/23 0906 11/26/23 1116   11/26/23 0950  vancomycin  variable dose per unstable renal function (pharmacist dosing)  Status:  Discontinued         Does not apply See admin instructions 11/26/23 0950 11/27/23 0942   11/26/23 0945  ceFEPIme  (MAXIPIME ) 2 g in sodium chloride  0.9 % 100 mL IVPB  Status:  Discontinued        2 g 200 mL/hr over 30 Minutes Intravenous Every 12 hours 11/26/23 0849 11/28/23 1253   11/26/23 0200  ganciclovir  (CYTOVENE ) 255 mg in sodium chloride  0.9 % 100 mL IVPB  Status:  Discontinued        5 mg/kg  51.2 kg 100 mL/hr over 60 Minutes Intravenous Every 12 hours 11/26/23 0008 11/26/23 1612   11/24/23 1400  sulfamethoxazole -trimethoprim  (BACTRIM ) 320 mg of trimethoprim  in dextrose  5 % 500 mL IVPB  Status:  Discontinued        320 mg of trimethoprim  346.7 mL/hr over 90 Minutes Intravenous Every 8 hours 11/24/23 0917 11/27/23 0909   11/22/23 1200  sulfamethoxazole -trimethoprim  (BACTRIM ) 256 mg of trimethoprim  in dextrose  5 % 500 mL IVPB  Status:  Discontinued        15 mg/kg/day of  trimethoprim   51.2 kg 344 mL/hr over 90 Minutes Intravenous Every 8 hours 11/22/23 0929 11/24/23 0917   11/20/23 1800  clindamycin  (CLEOCIN ) IVPB 600 mg  Status:  Discontinued        600 mg 100 mL/hr over 30 Minutes Intravenous Every 6 hours 11/20/23 1438 11/22/23 0929   11/20/23 1530  primaquine  tablet 30 mg  Status:  Discontinued        30 mg Oral Daily 11/20/23 1438 11/22/23 0929   11/18/23 2000  azithromycin  (ZITHROMAX ) tablet 1,200 mg  Status:  Discontinued        1,200 mg Oral Weekly 11/18/23 1907 11/26/23 1001   11/17/23 1500  sulfamethoxazole -trimethoprim  (BACTRIM ) 272.96 mg of trimethoprim  in dextrose  5 % 500 mL IVPB  Status:  Discontinued        15 mg/kg/day of trimethoprim   54.6 kg 344.7 mL/hr over 90 Minutes Intravenous Every 8 hours 11/17/23 1409 11/20/23 1437   11/16/23 1400  sulfamethoxazole -trimethoprim  (BACTRIM ) 200-40 MG/5ML suspension 30 mL  Status:  Discontinued        30 mL Oral Every 8 hours 11/16/23 0918 11/17/23 1409   11/12/23 2200  doxycycline  (VIBRA -TABS) tablet 100 mg  Status:  Discontinued        100 mg Oral Every 12 hours 11/12/23 1210 11/16/23 0914   11/09/23 1800  cefTRIAXone  (ROCEPHIN ) 2 g in sodium chloride  0.9 % 100 mL IVPB  Status:  Discontinued        2 g 200 mL/hr over 30 Minutes Intravenous Every 24 hours 11/09/23 1505 11/10/23 0946   11/08/23 1000  bictegravir-emtricitabine -tenofovir  AF (BIKTARVY ) 50-200-25 MG per tablet 1 tablet        1 tablet Oral Daily 11/08/23 0847     11/08/23 0930  sulfamethoxazole -trimethoprim  (BACTRIM ) 244.96 mg of trimethoprim  in dextrose  5 % 250 mL IVPB  Status:  Discontinued        15 mg/kg/day of trimethoprim   49 kg 265.3 mL/hr over 60 Minutes Intravenous Every 8 hours 11/08/23 0844 11/16/23 0918   11/08/23 0445  darunavir -cobicistat  (PREZCOBIX ) 800-150 MG per tablet 1 tablet  Status:  Discontinued  1 tablet Oral Daily with breakfast 11/08/23 0350 12/11/23 0723   11/08/23 0445  doxycycline  (VIBRAMYCIN ) 100 mg in  sodium chloride  0.9 % 250 mL IVPB  Status:  Discontinued        100 mg 125 mL/hr over 120 Minutes Intravenous 2 times daily 11/08/23 0351 11/12/23 1210   11/08/23 0330  Ampicillin -Sulbactam (UNASYN ) 3 g in sodium chloride  0.9 % 100 mL IVPB  Status:  Discontinued        3 g 200 mL/hr over 30 Minutes Intravenous 2 times daily 11/08/23 0230 11/08/23 0236   11/08/23 0330  Ampicillin -Sulbactam (UNASYN ) 3 g in sodium chloride  0.9 % 100 mL IVPB  Status:  Discontinued        3 g 200 mL/hr over 30 Minutes Intravenous Every 6 hours 11/08/23 0236 11/09/23 1505       Medications: Scheduled Meds:  sodium chloride    Intravenous Once   arformoterol   15 mcg Nebulization BID   azithromycin   1,200 mg Per Tube Weekly   bictegravir-emtricitabine -tenofovir  AF  1 tablet Oral Daily   Chlorhexidine  Gluconate Cloth  6 each Topical Daily   clonazepam   0.5 mg Per Tube BID   dapsone   100 mg Per Tube Daily   darunavir -cobicistat   1 tablet Oral Q supper   famotidine   10 mg Per Tube Daily   fiber  1 packet Per Tube BID   Gerhardt's butt cream   Topical BID   heparin  injection (subcutaneous)  5,000 Units Subcutaneous Q8H   insulin  aspart  0-9 Units Subcutaneous Q4H   midodrine   5 mg Per Tube Q8H   multivitamin with minerals  1 tablet Per Tube Daily   OLANZapine   5 mg Per Tube QHS   mouth rinse  15 mL Mouth Rinse Q2H   oxyCODONE   5 mg Per Tube Q8H   revefenacin   175 mcg Nebulization Daily   thiamine   100 mg Per Tube Daily   valproic  acid  500 mg Per Tube BID   Continuous Infusions:  ceFTAROline  (TEFLARO ) IV Stopped (12/14/23 1052)   feeding supplement (VITAL AF 1.2 CAL) 70 mL/hr at 12/14/23 1200   PRN Meds:.acetaminophen , alum & mag hydroxide-simeth, docusate, hydrocortisone , hydrOXYzine , ipratropium-albuterol , melatonin, mouth rinse, oxyCODONE , polyethylene glycol, sodium chloride     Objective: Weight change:   Intake/Output Summary (Last 24 hours) at 12/14/2023 1701 Last data filed at 12/14/2023  1600 Gross per 24 hour  Intake 1269.97 ml  Output 355 ml  Net 914.97 ml   Blood pressure (!) 102/51, pulse (!) 107, temperature (P) 97.9 F (36.6 C), temperature source (P) Axillary, resp. rate (!) 22, height 5' 7 (1.702 m), weight 37.3 kg, SpO2 92%. Temp:  [97.8 F (36.6 C)-99.2 F (37.3 C)] (P) 97.9 F (36.6 C) (07/10 1500) Pulse Rate:  [96-123] 107 (07/10 1600) Resp:  [17-27] 22 (07/10 1600) BP: (98-111)/(49-63) 102/51 (07/10 1600) SpO2:  [92 %-96 %] 92 % (07/10 1600) FiO2 (%):  [50 %] 50 % (07/10 1611)  Physical Exam: Physical Exam Constitutional:      Appearance: He is cachectic.  HENT:     Head: Normocephalic and atraumatic.  Neck:     Trachea: Tracheostomy present.  Cardiovascular:     Rate and Rhythm: Normal rate and regular rhythm.  Pulmonary:     Breath sounds: Rhonchi present. No wheezing.  Abdominal:     General: There is no distension.  Musculoskeletal:        General: Normal range of motion.  Skin:    General: Skin  is warm and dry.  Neurological:     General: No focal deficit present.     Mental Status: He is alert.      CBC:    BMET Recent Labs    12/13/23 0232 12/14/23 0230  NA 140 138  K 4.6 4.4  CL 94* 89*  CO2 27 25  GLUCOSE 105* 227*  BUN 149* 188*  CREATININE 4.01* 5.51*  CALCIUM  6.5* 6.7*     Liver Panel  No results for input(s): PROT, ALBUMIN, AST, ALT, ALKPHOS, BILITOT, BILIDIR, IBILI in the last 72 hours.     Sedimentation Rate No results for input(s): ESRSEDRATE in the last 72 hours. C-Reactive Protein No results for input(s): CRP in the last 72 hours.  Micro Results: Recent Results (from the past 720 hours)  Expectorated Sputum Assessment w Gram Stain, Rflx to Resp Cult     Status: None   Collection Time: 11/17/23  2:20 PM   Specimen: Expectorated Sputum  Result Value Ref Range Status   Specimen Description EXPECTORATED SPUTUM  Final   Special Requests Immunocompromised  Final   Sputum  evaluation   Final    THIS SPECIMEN IS ACCEPTABLE FOR SPUTUM CULTURE Performed at [facility], [address], [place], [place] [address]    Report Status 11/17/2023 FINAL  Final  Culture, Respiratory w Gram Stain     Status: None   Collection Time: 11/17/23  2:20 PM  Result Value Ref Range Status   Specimen Description EXPECTORATED SPUTUM  Final   Special Requests Immunocompromised Reflexed from [id number]  Final   Gram Stain NO WBC SEEN FEW GRAM POSITIVE COCCI IN PAIRS   Final   Culture   Final    MODERATE Normal respiratory flora-no Staph aureus or Pseudomonas seen Performed at [facility], [address], [place], [place] [address]    Report Status 11/19/2023 FINAL  Final  Blastomyces Antigen     Status: None   Collection Time: 11/25/23  6:05 AM   Specimen: Blood  Result Value Ref Range Status   Blastomyces Antigen None Detected None Detected ng/mL Final    Comment: (NOTE) Reference Interval: None Detected Reportable Range: 0.31 ng/mL - 20.00 ng/mL Results above 20.00 ng/mL are reported as 'Positive, Above the Limit of Quantification' This test was developed and its performance characteristics determined by [employer]. It has not been cleared or approved by the FDA; however, FDA clearance or approval is not currently required for clinical use. The results are not intended to be used as the sole means for clinical diagnosis or patient decisions.    Interpretation Negative  Final   Specimen Type SERUM  Final    Comment: (NOTE) Performed At: [facility] [address] [place], [place] [id number] [place] Pac MD Ey:[id number]   MRSA Next Gen by PCR, Nasal     Status: Abnormal   Collection Time: 11/25/23 11:35 PM   Specimen: Nasal Mucosa; Nasal Swab  Result Value Ref Range Status   MRSA by PCR Next Gen DETECTED (A) NOT DETECTED Final    Comment: RESULT CALLED TO, READ BACK BY AND VERIFIED WITH: SHAFFNER,P RN 11/26/2023 AT 0218  SKEEN,P (NOTE) The GeneXpert MRSA Assay (FDA approved for NASAL specimens only), is one component of a comprehensive MRSA colonization surveillance program. It is not intended to diagnose MRSA infection nor to guide or monitor treatment for MRSA infections. Test performance is not FDA approved in patients less than 33 years old. Performed at  [facility], [address], [place], [place] [address]   Culture, Respiratory w Gram Stain     Status: None   Collection Time: 11/27/23  9:27 AM   Specimen: Bronchoalveolar Lavage; Respiratory  Result Value Ref Range Status   Specimen Description BRONCHIAL ALVEOLAR LAVAGE  Final   Special Requests NONE  Final   Gram Stain   Final    NO WBC SEEN NO ORGANISMS SEEN Performed at [facility], [address] [place], [place] [address]    Culture RARE CANDIDA ALBICANS  Final   Report Status 11/30/2023 FINAL  Final  Acid Fast Smear (AFB)     Status: None   Collection Time: 11/27/23  9:27 AM   Specimen: Bronchoalveolar Lavage; Respiratory  Result Value Ref Range Status   AFB Specimen Processing Concentration  Final   Acid Fast Smear Negative  Final    Comment: (NOTE) Performed At: [facility] [address] [place], [place] [id number] Jennette Shorter MD Ey:[id number]    Source (AFB) BRONCHIAL ALVEOLAR LAVAGE  Final    Comment: Performed at [facility], [address] [place], [place] [address]  Fungus Culture With Stain     Status: Abnormal   Collection Time: 11/27/23  9:27 AM   Specimen: Bronchoalveolar Lavage; Respiratory  Result Value Ref Range Status   Fungus Stain Final report  Final   Fungus (Mycology) Culture Preliminary report (A)  Final    Comment: (NOTE) Performed At: [facility] [address] [place], [place] [id number] Jennette Shorter MD Ey:[id number]    Fungal Source BRONCHIAL ALVEOLAR LAVAGE  Final    Comment: Performed at [facility], [address], [place], [place] [address]   Virus culture     Status: Abnormal   Collection Time: 11/27/23  9:27 AM   Specimen: Bronchoalveolar Lavage  Result Value Ref Range Status   Viral Culture Comment (A)  Final    Comment: (NOTE) Positive Cytomegalovirus detected by immunofluorescent monoclonal antibody staining in shell vial culture. Performed At: [facility] [address] [place], [place] [id number] Jennette Shorter MD Ey:[id number]    Source of Sample BRONCHIAL ALVEOLAR LAVAGE  Final    Comment: Performed at [facility], [address], [place], [place] [address]  Fungus Culture Result     Status: None   Collection Time: 11/27/23  9:27 AM  Result Value Ref Range Status   Result 1 Comment  Final    Comment: (NOTE) KOH/Calcofluor preparation:  no fungus observed. Performed At: [facility] [address] [place], [place] [id number] Jennette Shorter MD Ey:[id number]   Fungal organism reflex     Status: Abnormal   Collection Time: 11/27/23  9:27 AM  Result Value Ref Range Status   Fungal result 1 Candida albicans (A)  Final    Comment: (NOTE) Moderate growth Performed At: [facility] [address] [place], [place] [id number] Jennette Shorter MD Ey:[id number]   Culture, blood (Routine X 2) w Reflex to ID Panel     Status: None   Collection Time: 12/01/23 12:43 PM   Specimen: BLOOD  Result Value Ref Range Status   Specimen Description BLOOD SITE NOT SPECIFIED  Final   Special Requests   Final    BOTTLES DRAWN AEROBIC AND ANAEROBIC Blood Culture adequate volume   Culture   Final    NO GROWTH 5 DAYS Performed at [facility], [address], [place], [place] [address]  Report Status 12/06/2023 FINAL  Final  Culture, blood (Routine X 2) w Reflex to ID Panel     Status: None   Collection Time: 12/01/23 12:44 PM   Specimen: BLOOD  Result Value Ref Range Status   Specimen Description BLOOD SITE NOT SPECIFIED  Final   Special Requests   Final    BOTTLES DRAWN AEROBIC AND  ANAEROBIC Blood Culture adequate volume   Culture   Final    NO GROWTH 5 DAYS Performed at [facility], [address] [place], [place] [address]    Report Status 12/06/2023 FINAL  Final  Culture, Respiratory w Gram Stain     Status: None   Collection Time: 12/05/23 11:40 AM   Specimen: Bronchoalveolar Lavage; Respiratory  Result Value Ref Range Status   Specimen Description BRONCHIAL ALVEOLAR LAVAGE  Final   Special Requests NONE  Final   Gram Stain   Final    RARE WBC PRESENT, PREDOMINANTLY MONONUCLEAR NO ORGANISMS SEEN Performed at [facility], [address], [place], [place] [address]    Culture RARE CANDIDA ALBICANS  Final   Report Status 12/07/2023 FINAL  Final  Acid Fast Smear (AFB)     Status: None   Collection Time: 12/05/23 11:40 AM   Specimen: Bronchoalveolar Lavage; Respiratory  Result Value Ref Range Status   AFB Specimen Processing Concentration  Final   Acid Fast Smear Negative  Final    Comment: (NOTE) Performed At: [facility] [address] [place], [place] [id number] Jennette Shorter MD Ey:[id number]    Source (AFB) BRONCHIAL ALVEOLAR LAVAGE  Final    Comment: Performed at [facility], [address], [place], [place] [address]  Fungus Culture With Stain     Status: Abnormal (Preliminary result)   Collection Time: 12/05/23 11:40 AM   Specimen: Bronchoalveolar Lavage; Respiratory  Result Value Ref Range Status   Fungus Stain Final report (A)  Final    Comment: (NOTE) Performed At: [facility] [address] [place], [place] [id number] Jennette Shorter MD Ey:[id number]    Fungus (Mycology) Culture PENDING  Incomplete   Fungal Source BRONCHIAL ALVEOLAR LAVAGE  Final    Comment: Performed at [facility], [address] [place], [place] [address]  Fungus Culture Result     Status: Abnormal   Collection Time: 12/05/23 11:40 AM  Result Value Ref Range Status   Result 1 Comment (A)  Final    Comment:  (NOTE) Fungal elements, such as arthroconidia, hyphal fragments, chlamydoconidia, observed. Performed At: [facility] [address] [place], [place] [id number] Jennette Shorter MD Ey:[id number]   Acid Fast Smear (AFB)     Status: None   Collection Time: 12/09/23 11:00 AM   Specimen: Vein; Blood  Result Value Ref Range Status   AFB Specimen Processing Concentration  Final    Comment: (NOTE) Performed At: [facility] [address] [place], [place] [id number] Jennette Shorter MD Ey:[id number]    Acid Fast Smear QNSAFB  Final    Comment: (NOTE) Test not performed. AFB Smear not performed due to specimen source (blood) or insufficient specimen.    Source (AFB) AFB SUSCEPTIBILITY  Final    Comment: Performed at [facility], [address], [place], [place] [address]  Culture, Respiratory w Gram Stain     Status: None   Collection Time: 12/09/23  4:05 PM   Specimen: Tracheal Aspirate; Respiratory  Result Value Ref Range Status   Specimen Description TRACHEAL  ASPIRATE  Final   Special Requests NONE  Final   Gram Stain   Final    RARE WBC PRESENT, PREDOMINANTLY PMN FEW GRAM POSITIVE COCCI RARE GRAM NEGATIVE RODS Performed at [facility], [address], [place], [place] [address]    Culture   Final    ABUNDANT METHICILLIN RESISTANT STAPHYLOCOCCUS AUREUS   Report Status 12/11/2023 FINAL  Final   Organism ID, Bacteria METHICILLIN RESISTANT STAPHYLOCOCCUS AUREUS  Final      Susceptibility   Methicillin resistant staphylococcus aureus - MIC*    CIPROFLOXACIN >=8 RESISTANT Resistant     ERYTHROMYCIN >=8 RESISTANT Resistant     GENTAMICIN <=0.5 SENSITIVE Sensitive     OXACILLIN >=4 RESISTANT Resistant     TETRACYCLINE >=16 RESISTANT Resistant     VANCOMYCIN  1 SENSITIVE Sensitive     TRIMETH /SULFA  >=320 RESISTANT Resistant     CLINDAMYCIN  >=8 RESISTANT Resistant     RIFAMPIN <=0.5 SENSITIVE Sensitive     Inducible Clindamycin  NEGATIVE Sensitive      LINEZOLID  2 SENSITIVE Sensitive     * ABUNDANT METHICILLIN RESISTANT STAPHYLOCOCCUS AUREUS    Studies/Results: DG CHEST PORT 1 VIEW Result Date: 12/13/2023 CLINICAL DATA:  [id number]. Chest tube in place. Follow-up airspace disease. EXAM: PORTABLE CHEST 1 VIEW COMPARISON:  Portable chest yesterday at 9:20 a.m. FINDINGS: 5:15 a.m. Tracheostomy tip remains 4.3 cm from the carina. Feeding tube well inside the stomach with the radiopaque tip out of view. There is a minimally small right apical pneumothorax, unchanged. There is extensive bilateral chest wall emphysema. Heart is slightly enlarged with prominence in the central vessels. There is right greater the left perihilar interstitial and airspace the disease consistent with edema, pneumonia or combination. There are minimal pleural effusions. There is no new or worsening airspace disease or measurable left pneumothorax. Left chest tube positioning is unaltered. The mediastinum is stable. No new osseous findings. Overall aeration is not significantly changed.  No new abnormality. IMPRESSION: 1. No significant change in aeration. 2. Minimally small right apical pneumothorax, unchanged. 3. Extensive bilateral chest wall emphysema. 4. Right greater than left perihilar interstitial and airspace disease consistent with edema, pneumonia or combination. 5. Minimal pleural effusions. 6. Stable support apparatus. Electronically Signed   By: Francis Quam M.D.   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  Viral load has been suppressed the CD4 count is low he is now leukopenic as well.  Will continue Biktarvy  and Prezcobix    #2 VAP: MRSA isolated and he was  briefly on vancomycin  and then Zyvox  now Teflaro .   #3 ? CMV pneumonitis:  This diagnosis is difficult to prove without tissue biopsy I would not be opposed to treating him again with drugs but he seems to have failed ganciclovir  due to resistance and he seems to be an impending need of hemodialysis and the last thing I want to do is give him an antiviral that pushes him into certain hemodialysis  #4 acute renal failure: Curious that occurred with only 1 dose of vancomycin  and that after ganciclovir  had been stopped.  #5 leukopenia also curious finding giving that the most likely offending agents had been stopped.  #6 fevers: Now that he is leukopenic I wonder how neutropenic he is in fact he may be suffering from neutropenic fevers I will add on a differential to his CBC.   #6  Respiratory failure status post tracheostomy.  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   CRITICAL CARE Performed by: Jomarie Salinas  Dam   Total critical care time: 32 minutes  Critical care time was exclusive of separately billable procedures and treating other patients.  Critical care was necessary to treat or prevent imminent or life-threatening deterioration.  Critical care was time spent personally by me on the following activities: development of treatment plan with patient and/or surrogate as well as nursing, discussions with consultants, evaluation of patient's response to treatment, examination of patient, obtaining history from patient or surrogate, ordering and performing treatments and interventions, ordering and review of laboratory studies, ordering and review of radiographic studies, pulse oximetry and re-evaluation of patient's condition.  Evaluation of the patient requires complex antimicrobial therapy evaluation, counseling , isolation needs to reduce disease transmission and risk assessment and mitigation.     LOS: 36 days   Jomarie Salinas Rothman 12/14/2023, 5:01 PM

## 2023-12-15 DIAGNOSIS — Z515 Encounter for palliative care: Secondary | ICD-10-CM | POA: Diagnosis not present

## 2023-12-15 DIAGNOSIS — Z930 Tracheostomy status: Secondary | ICD-10-CM | POA: Diagnosis not present

## 2023-12-15 DIAGNOSIS — B20 Human immunodeficiency virus [HIV] disease: Secondary | ICD-10-CM | POA: Diagnosis not present

## 2023-12-15 DIAGNOSIS — J9602 Acute respiratory failure with hypercapnia: Secondary | ICD-10-CM | POA: Diagnosis not present

## 2023-12-15 DIAGNOSIS — B250 Cytomegaloviral pneumonitis: Secondary | ICD-10-CM | POA: Diagnosis not present

## 2023-12-15 DIAGNOSIS — J9601 Acute respiratory failure with hypoxia: Secondary | ICD-10-CM | POA: Diagnosis not present

## 2023-12-15 DIAGNOSIS — B9562 Methicillin resistant Staphylococcus aureus infection as the cause of diseases classified elsewhere: Secondary | ICD-10-CM | POA: Diagnosis not present

## 2023-12-15 DIAGNOSIS — J15212 Pneumonia due to Methicillin resistant Staphylococcus aureus: Secondary | ICD-10-CM

## 2023-12-15 DIAGNOSIS — R4589 Other symptoms and signs involving emotional state: Secondary | ICD-10-CM | POA: Diagnosis not present

## 2023-12-15 DIAGNOSIS — J95851 Ventilator associated pneumonia: Secondary | ICD-10-CM | POA: Diagnosis not present

## 2023-12-15 LAB — CBC WITH DIFFERENTIAL/PLATELET
Abs Immature Granulocytes: 0.01 K/uL (ref 0.00–0.07)
Basophils Absolute: 0 K/uL (ref 0.0–0.1)
Basophils Relative: 0 %
Eosinophils Absolute: 0 K/uL (ref 0.0–0.5)
Eosinophils Relative: 0 %
HCT: 21.7 % — ABNORMAL LOW (ref 39.0–52.0)
Hemoglobin: 7.4 g/dL — ABNORMAL LOW (ref 13.0–17.0)
Immature Granulocytes: 1 %
Lymphocytes Relative: 16 %
Lymphs Abs: 0.2 K/uL — ABNORMAL LOW (ref 0.7–4.0)
MCH: 30.7 pg (ref 26.0–34.0)
MCHC: 34.1 g/dL (ref 30.0–36.0)
MCV: 90 fL (ref 80.0–100.0)
Monocytes Absolute: 0.1 K/uL (ref 0.1–1.0)
Monocytes Relative: 10 %
Neutro Abs: 0.8 K/uL — ABNORMAL LOW (ref 1.7–7.7)
Neutrophils Relative %: 73 %
Platelets: 93 K/uL — ABNORMAL LOW (ref 150–400)
RBC: 2.41 MIL/uL — ABNORMAL LOW (ref 4.22–5.81)
RDW: 18.2 % — ABNORMAL HIGH (ref 11.5–15.5)
Smear Review: DECREASED
WBC: 1.1 K/uL — CL (ref 4.0–10.5)
nRBC: 4.8 % — ABNORMAL HIGH (ref 0.0–0.2)

## 2023-12-15 LAB — GLUCOSE, CAPILLARY
Glucose-Capillary: 136 mg/dL — ABNORMAL HIGH (ref 70–99)
Glucose-Capillary: 164 mg/dL — ABNORMAL HIGH (ref 70–99)
Glucose-Capillary: 172 mg/dL — ABNORMAL HIGH (ref 70–99)
Glucose-Capillary: 159 mg/dL — ABNORMAL HIGH (ref 70–99)
Glucose-Capillary: 184 mg/dL — ABNORMAL HIGH (ref 70–99)
Glucose-Capillary: 181 mg/dL — ABNORMAL HIGH (ref 70–99)

## 2023-12-15 LAB — BASIC METABOLIC PANEL WITH GFR
Anion gap: 25 — ABNORMAL HIGH (ref 5–15)
BUN: 227 mg/dL — ABNORMAL HIGH (ref 6–20)
CO2: 25 mmol/L (ref 22–32)
Calcium: 6.9 mg/dL — ABNORMAL LOW (ref 8.9–10.3)
Chloride: 89 mmol/L — ABNORMAL LOW (ref 98–111)
Creatinine, Ser: 6.93 mg/dL — ABNORMAL HIGH (ref 0.61–1.24)
GFR, Estimated: 10 mL/min — ABNORMAL LOW (ref >60)
Glucose, Bld: 136 mg/dL — ABNORMAL HIGH (ref 70–99)
Potassium: 5.9 mmol/L — ABNORMAL HIGH (ref 3.5–5.1)
Sodium: 139 mmol/L (ref 135–145)

## 2023-12-15 LAB — PATHOLOGIST SMEAR REVIEW

## 2023-12-15 LAB — AMMONIA: Ammonia: 85 umol/L — ABNORMAL HIGH (ref 9–35)

## 2023-12-15 MED ORDER — HYDROMORPHONE HCL 1 MG/ML IJ SOLN
1.0000 mg | INTRAMUSCULAR | Status: DC | PRN
  Administered 2023-12-16: 1 mg via INTRAVENOUS
  Filled 2023-12-15: qty 2

## 2023-12-15 MED ORDER — SODIUM ZIRCONIUM CYCLOSILICATE 10 G PO PACK
10.0000 g | PACK | Freq: Two times a day (BID) | ORAL | Status: DC
  Administered 2023-12-15 – 2023-12-16 (×3): 10 g via ORAL
  Filled 2023-12-15 (×3): qty 1

## 2023-12-15 NOTE — IPAL (Addendum)
  Interdisciplinary Goals of Care Family Meeting   Date carried out: 12/15/2023  Location of the meeting: Conference room  Member's involved: Physician, Bedside Registered Nurse, and Family Member or next of kin  Durable Power of Attorney or acting medical decision maker: Mother    Discussion: We discussed goals of care for Cory Russell .   F/u from [facility] discussion Discussed paths forward: Dialysis/CRRT and pushing forward toward vent/SNF/HD facility understanding organs may not be able to handle dialysis and there could be further deterioration Continuing vent support but allowing natural death should it occur, comfort meds PRN Removing vent and focusing on comfort  After discussion regarding Cory Russell's values to date and what he would have wanted, she does not think he would want HD nor does she think current QoL is acceptable.  She does not think he would want to be in a facility for any lengthened period of time.  She agrees with us  that it is in God's hands now and additional interventions only lengthen the current suffering patient is enduring.  Code status: DNR  Disposition:  Continue vent support Opiates PRN any signs of air hunger Allow natural death should it occur Do not start pressors Will not give additional lokelma  or treatment for hyperkalemia understanding underlying disease process is irreversible (okay for existing standing order)  Time spent for the meeting: 15 mins    Cory JAYSON Sharps, MD  12/15/2023, 10:18 AM

## 2023-12-15 NOTE — Progress Notes (Addendum)
 NAME:  Cory Russell, MRN:  [id number], DOB:  1991-04-11, LOS: 37 ADMISSION DATE:  11/08/2023, CONSULTATION DATE: 11/19/2023 REFERRING MD:  Jonel - TRH, CHIEF COMPLAINT:  Respiratory failure   History of Present Illness:  Cory Russell is a 33 y.o. man with past medical history of HIV/AIDS, CD4<35 on admission, not on ARVs. Presents with seizure like activity 11 days ago and found to have sepsis from pneumonia.  He notes symptoms started about 2-3 weeks prior to admit with shortness of breath and dry cough. He had two separate rounds of antibiotics visiting other urgent care and ED visits. He has tried albuterol  nebulizer treatments from friends and initially found them helpful. CT Chest shows bilateral ground glass opacities concerning for pneumoncystis pneumonia. Since being here he has been Started on empiric antibiotics with bactrim  and steroids. Also resumed ARVs during this admission. Has been persistently hypoxemic on 10LNC. Pulmonary consulted to assist with evaluation and management and for bronchoscopy.   He feels persistently short of breath, worse with exertion. He feels like chest PT is helping him. Feeling more anxious. Also having difficulty taking pills.   Pertinent Medical History:  HIV, Seizure disorder  Significant Hospital Events: Including procedures, antibiotic start and stop dates in addition to other pertinent events   6/04 Admit 6/15 Pulmonary consult 6/18 Transient desats with worsening chest x-ray.  Prednisone  changed to IV Solu-Medrol  6/22 Worsening respiratory status requiring ICU transfer and intubation 6/23 Bronch for BAL per ID request. NO being weaned from 20 to 15. Overnight L PTX s/p chest tube. 6/25 Required transition to SIMV vent mode due to dyssynchrony. Sedation increased. Paralytic push given. CXR improved s/p diuresis. Required secondary L pigtail for persistent PTX.  6/26 Large bore chest tube placed, pigtail removed. 6/30 discussed plan for trach.  Weaning sedation  7/1 trach 7/2 weaning sedation. Chest tube inadvertently malfunctioned.   7/3 weaning sedation, vent  7/4 CMV viral load came back higher than before, question of resistance.  Ganciclovir  stopped by ID.  Gets tachypneic with dysfunctional breathing trial 7/5 spiked fever with Tmax 102.3, remained tachycardic in 120s  Interim History / Subjective:  Poorly responsive on vent. Remains oliguric  Objective:   Blood pressure (!) 100/51, pulse (!) 101, temperature 99.2 F (37.3 C), temperature source Axillary, resp. rate (!) 21, height 5' 7 (1.702 m), weight 37.5 kg, SpO2 93%.    Vent Mode: PSV;CPAP FiO2 (%):  [50 %] 50 % Set Rate:  [18 bmp] 18 bmp Vt Set:  [410 mL] 410 mL PEEP:  [8 cmH20] 8 cmH20 Pressure Support:  [8 cmH20-10 cmH20] 8 cmH20 Plateau Pressure:  [12 cmH20] 12 cmH20   Intake/Output Summary (Last 24 hours) at 12/15/2023 0914 Last data filed at 12/15/2023 0800 Gross per 24 hour  Intake 1770.44 ml  Output 540 ml  Net 1230.44 ml   Filed Weights   12/11/23 0500 12/12/23 0600 12/15/23 9376  Weight: 37.7 kg 37.3 kg 37.5 kg   Physical Examination: Chronically ill Not following commands for me Ext warm +anasarca Abd soft, heart sounds regular  Patient Lines/Drains/Airways Status     Active Line/Drains/Airways     Name Placement date Placement time Site Days   Peripheral IV 12/10/23 20 G 1 Left;Posterior Forearm 12/10/23  1133  Forearm  5   Chest Tube 1 Lateral;Left Pleural 32 Fr. 11/30/23  1130  Pleural  15   Urethral Catheter Katie M RN Latex 16 Fr. 12/13/23  1015  Latex  2   Small Bore  Feeding Tube 10 Fr. Left nare Marking at nare/corner of mouth 73 cm 11/27/23  1210  Left nare  18   Tracheostomy Shiley Flexible 6 mm Cuffed 12/05/23  1149  6 mm  10   Wound 12/13/23 0800 Irritant Contact Dermatitis Buttocks Right 12/13/23  0800  Buttocks  2            ceFTAROline  (TEFLARO ) IV Stopped (12/14/23 2310)   feeding supplement (VITAL AF 1.2 CAL)  70 mL/hr at 12/15/23 0800     Resolved Hospital problems  Probable severe PJP pneumonia Sepsis due to bilateral superimposed bacterial pneumonia with ARDS Acute septic encephalopathy Hyponatremia Hyperkalemia   Assessment and Plan:  Acute respiratory failure with hypoxia and hypercapnia status post trach Left-sided pneumothorax and pneumomediastinum with subcutaneous emphysema status post chest tube MRSA pneumonia PJP pneumonia at presentation Sepsis due to recurrent bilateral multifocal pneumonia, now with MRSA HIV AIDS Probable IRIS> potential source of ongoing fevers CMV viremia, question possible pneumonitis Leukopenia without obvious cause-- would be very early to be a side effect of linezolid , and no significant thrombocytopenia AKI due to septic ATN> worsening Sinus tachycardia due to infection Cachexia, severe protein calorie malnutrition Seizure disorder-con't depakote  Anemia of critical illness Debility due to critical illness associated weakness  - Continue vent support - Teflaro  as ordered, appreciate ID help - PT/OT/SLP help appreciated - Received a course of gancyclovir, very unclear if true pathogen in this case - Check ammonia, may need to switch depakene  - Stop all sedating meds - Going to speak to family today: I think starting HD may add time to his lifespan but do not think the quality of life would be congruent with his previously stated wishes, will try to re-address today - Dapsone  and ARVs as ordered  33 min cc time Rolan Sharps MD PCCM

## 2023-12-15 NOTE — Progress Notes (Signed)
 Subjective:  somnolent   Antibiotics:  Anti-infectives (From admission, onward)    Start     Dose/Rate Route Frequency Ordered Stop   12/13/23 1800  ceftaroline  (TEFLARO ) 200 mg in sodium chloride  0.9 % 100 mL IVPB        200 mg 110 mL/hr over 60 Minutes Intravenous Every 12 hours 12/13/23 0943     12/11/23 1815  linezolid  (ZYVOX ) tablet 600 mg  Status:  Discontinued        600 mg Per Tube Every 12 hours 12/11/23 1722 12/13/23 0943   12/11/23 1800  darunavir -cobicistat  (PREZCOBIX ) 800-150 MG per tablet 1 tablet        1 tablet Oral Daily with supper 12/11/23 0723     12/11/23 1400  vancomycin  (VANCOREADY) IVPB 500 mg/100 mL  Status:  Discontinued        500 mg 100 mL/hr over 60 Minutes Intravenous Every 24 hours 12/10/23 1238 12/11/23 1256   12/11/23 1345  linezolid  (ZYVOX ) IVPB 600 mg  Status:  Discontinued        600 mg 300 mL/hr over 60 Minutes Intravenous Every 12 hours 12/11/23 1257 12/11/23 1722   12/10/23 1330  vancomycin  (VANCOREADY) IVPB 750 mg/150 mL        750 mg 150 mL/hr over 60 Minutes Intravenous  Once 12/10/23 1238 12/10/23 1457   12/10/23 1315  vancomycin  (VANCOREADY) IVPB 500 mg/100 mL  Status:  Discontinued        500 mg 100 mL/hr over 60 Minutes Intravenous Every 24 hours 12/10/23 1226 12/10/23 1238   12/02/23 2000  azithromycin  (ZITHROMAX ) tablet 1,200 mg        1,200 mg Per Tube Weekly 11/26/23 1001     11/29/23 1700  ganciclovir  (CYTOVENE ) 250 mg in sodium chloride  0.9 % 100 mL IVPB  Status:  Discontinued        250 mg 100 mL/hr over 60 Minutes Intravenous Every 12 hours 11/29/23 0851 12/08/23 1437   11/29/23 1000  dapsone  tablet 100 mg        100 mg Per Tube Daily 11/28/23 1222     11/28/23 1800  ceFEPIme  (MAXIPIME ) 2 g in sodium chloride  0.9 % 100 mL IVPB        2 g 200 mL/hr over 30 Minutes Intravenous Every 8 hours 11/28/23 1253 12/02/23 1835   11/27/23 1200  clindamycin  (CLEOCIN ) IVPB 600 mg        600 mg 100 mL/hr over 30 Minutes  Intravenous Every 6 hours 11/27/23 0909 11/28/23 2033   11/27/23 1030  linezolid  (ZYVOX ) tablet 600 mg        600 mg Per Tube Every 12 hours 11/27/23 0942 12/02/23 2103   11/27/23 1000  primaquine  tablet 30 mg        30 mg Per Tube Daily 11/27/23 0909 11/28/23 1100   11/27/23 0500  ganciclovir  (CYTOVENE ) 130 mg in sodium chloride  0.9 % 100 mL IVPB  Status:  Discontinued        2.5 mg/kg  51.2 kg 100 mL/hr over 60 Minutes Intravenous Every 12 hours 11/26/23 1612 11/26/23 1612   11/27/23 0500  ganciclovir  (CYTOVENE ) 125 mg in sodium chloride  0.9 % 100 mL IVPB  Status:  Discontinued        2.5 mg/kg  49.3 kg 100 mL/hr over 60 Minutes Intravenous Every 12 hours 11/26/23 1612 11/29/23 0851   11/26/23 1000  vancomycin  (VANCOCIN ) IVPB 1000 mg/200 mL premix  1,000 mg 200 mL/hr over 60 Minutes Intravenous  Once 11/26/23 0906 11/26/23 1116   11/26/23 0950  vancomycin  variable dose per unstable renal function (pharmacist dosing)  Status:  Discontinued         Does not apply See admin instructions 11/26/23 0950 11/27/23 0942   11/26/23 0945  ceFEPIme  (MAXIPIME ) 2 g in sodium chloride  0.9 % 100 mL IVPB  Status:  Discontinued        2 g 200 mL/hr over 30 Minutes Intravenous Every 12 hours 11/26/23 0849 11/28/23 1253   11/26/23 0200  ganciclovir  (CYTOVENE ) 255 mg in sodium chloride  0.9 % 100 mL IVPB  Status:  Discontinued        5 mg/kg  51.2 kg 100 mL/hr over 60 Minutes Intravenous Every 12 hours 11/26/23 0008 11/26/23 1612   11/24/23 1400  sulfamethoxazole -trimethoprim  (BACTRIM ) 320 mg of trimethoprim  in dextrose  5 % 500 mL IVPB  Status:  Discontinued        320 mg of trimethoprim  346.7 mL/hr over 90 Minutes Intravenous Every 8 hours 11/24/23 0917 11/27/23 0909   11/22/23 1200  sulfamethoxazole -trimethoprim  (BACTRIM ) 256 mg of trimethoprim  in dextrose  5 % 500 mL IVPB  Status:  Discontinued        15 mg/kg/day of trimethoprim   51.2 kg 344 mL/hr over 90 Minutes Intravenous Every 8 hours  11/22/23 0929 11/24/23 0917   11/20/23 1800  clindamycin  (CLEOCIN ) IVPB 600 mg  Status:  Discontinued        600 mg 100 mL/hr over 30 Minutes Intravenous Every 6 hours 11/20/23 1438 11/22/23 0929   11/20/23 1530  primaquine  tablet 30 mg  Status:  Discontinued        30 mg Oral Daily 11/20/23 1438 11/22/23 0929   11/18/23 2000  azithromycin  (ZITHROMAX ) tablet 1,200 mg  Status:  Discontinued        1,200 mg Oral Weekly 11/18/23 1907 11/26/23 1001   11/17/23 1500  sulfamethoxazole -trimethoprim  (BACTRIM ) 272.96 mg of trimethoprim  in dextrose  5 % 500 mL IVPB  Status:  Discontinued        15 mg/kg/day of trimethoprim   54.6 kg 344.7 mL/hr over 90 Minutes Intravenous Every 8 hours 11/17/23 1409 11/20/23 1437   11/16/23 1400  sulfamethoxazole -trimethoprim  (BACTRIM ) 200-40 MG/5ML suspension 30 mL  Status:  Discontinued        30 mL Oral Every 8 hours 11/16/23 0918 11/17/23 1409   11/12/23 2200  doxycycline  (VIBRA -TABS) tablet 100 mg  Status:  Discontinued        100 mg Oral Every 12 hours 11/12/23 1210 11/16/23 0914   11/09/23 1800  cefTRIAXone  (ROCEPHIN ) 2 g in sodium chloride  0.9 % 100 mL IVPB  Status:  Discontinued        2 g 200 mL/hr over 30 Minutes Intravenous Every 24 hours 11/09/23 1505 11/10/23 0946   11/08/23 1000  bictegravir-emtricitabine -tenofovir  AF (BIKTARVY ) 50-200-25 MG per tablet 1 tablet        1 tablet Oral Daily 11/08/23 0847     11/08/23 0930  sulfamethoxazole -trimethoprim  (BACTRIM ) 244.96 mg of trimethoprim  in dextrose  5 % 250 mL IVPB  Status:  Discontinued        15 mg/kg/day of trimethoprim   49 kg 265.3 mL/hr over 60 Minutes Intravenous Every 8 hours 11/08/23 0844 11/16/23 0918   11/08/23 0445  darunavir -cobicistat  (PREZCOBIX ) 800-150 MG per tablet 1 tablet  Status:  Discontinued        1 tablet Oral Daily with breakfast 11/08/23 0350 12/11/23 0723   11/08/23 0445  doxycycline  (VIBRAMYCIN ) 100 mg in sodium chloride  0.9 % 250 mL IVPB  Status:  Discontinued        100 mg 125  mL/hr over 120 Minutes Intravenous 2 times daily 11/08/23 0351 11/12/23 1210   11/08/23 0330  Ampicillin -Sulbactam (UNASYN ) 3 g in sodium chloride  0.9 % 100 mL IVPB  Status:  Discontinued        3 g 200 mL/hr over 30 Minutes Intravenous 2 times daily 11/08/23 0230 11/08/23 0236   11/08/23 0330  Ampicillin -Sulbactam (UNASYN ) 3 g in sodium chloride  0.9 % 100 mL IVPB  Status:  Discontinued        3 g 200 mL/hr over 30 Minutes Intravenous Every 6 hours 11/08/23 0236 11/09/23 1505       Medications: Scheduled Meds:  sodium chloride    Intravenous Once   arformoterol   15 mcg Nebulization BID   azithromycin   1,200 mg Per Tube Weekly   bictegravir-emtricitabine -tenofovir  AF  1 tablet Oral Daily   Chlorhexidine  Gluconate Cloth  6 each Topical Daily   dapsone   100 mg Per Tube Daily   darunavir -cobicistat   1 tablet Oral Q supper   famotidine   10 mg Per Tube Daily   fiber  1 packet Per Tube BID   Gerhardt's butt cream   Topical BID   heparin  injection (subcutaneous)  5,000 Units Subcutaneous Q8H   insulin  aspart  0-9 Units Subcutaneous Q4H   midodrine   5 mg Per Tube Q8H   multivitamin with minerals  1 tablet Per Tube Daily   mouth rinse  15 mL Mouth Rinse Q2H   revefenacin   175 mcg Nebulization Daily   sodium zirconium cyclosilicate   10 g Oral BID   thiamine   100 mg Per Tube Daily   valproic  acid  500 mg Per Tube BID   Continuous Infusions:  ceFTAROline  (TEFLARO ) IV 200 mg (12/15/23 0955)   feeding supplement (VITAL AF 1.2 CAL) 70 mL/hr at 12/15/23 0800   PRN Meds:.acetaminophen , alum & mag hydroxide-simeth, docusate, hydrocortisone , HYDROmorphone  (DILAUDID ) injection, ipratropium-albuterol , melatonin, mouth rinse, oxyCODONE , polyethylene glycol, sodium chloride     Objective: Weight change:   Intake/Output Summary (Last 24 hours) at 12/15/2023 1334 Last data filed at 12/15/2023 1200 Gross per 24 hour  Intake 1660.47 ml  Output 275 ml  Net 1385.47 ml   Blood pressure (!) 106/48,  pulse (!) 101, temperature 98.7 F (37.1 C), temperature source Oral, resp. rate (!) 24, height 5' 7 (1.702 m), weight 37.5 kg, SpO2 91%. Temp:  [97.9 F (36.6 C)-99.2 F (37.3 C)] 98.7 F (37.1 C) (07/11 1100) Pulse Rate:  [98-109] 101 (07/11 1230) Resp:  [17-28] 24 (07/11 1230) BP: (94-108)/(47-62) 106/48 (07/11 1230) SpO2:  [91 %-97 %] 91 % (07/11 1230) FiO2 (%):  [50 %] 50 % (07/11 0742) Weight:  [37.5 kg] 37.5 kg (07/11 9376)  Physical Exam: Physical Exam HENT:     Head: Normocephalic and atraumatic.  Eyes:     General:        Right eye: No discharge.        Left eye: No discharge.  Neck:     Trachea: Tracheostomy present.  Cardiovascular:     Rate and Rhythm: Regular rhythm. Tachycardia present.  Musculoskeletal:        General: Normal range of motion.  Neurological:     General: No focal deficit present.      CBC:    BMET Recent Labs    12/14/23 0230 12/15/23 0153  NA 138 139  K 4.4  5.9*  CL 89* 89*  CO2 25 25  GLUCOSE 227* 136*  BUN 188* 227*  CREATININE 5.51* 6.93*  CALCIUM  6.7* 6.9*     Liver Panel  No results for input(s): PROT, ALBUMIN, AST, ALT, ALKPHOS, BILITOT, BILIDIR, IBILI in the last 72 hours.     Sedimentation Rate No results for input(s): ESRSEDRATE in the last 72 hours. C-Reactive Protein No results for input(s): CRP in the last 72 hours.  Micro Results: Recent Results (from the past 720 hours)  Expectorated Sputum Assessment w Gram Stain, Rflx to Resp Cult     Status: None   Collection Time: 11/17/23  2:20 PM   Specimen: Expectorated Sputum  Result Value Ref Range Status   Specimen Description EXPECTORATED SPUTUM  Final   Special Requests Immunocompromised  Final   Sputum evaluation   Final    THIS SPECIMEN IS ACCEPTABLE FOR SPUTUM CULTURE Performed at [facility], [address], [place], [place] [address]    Report Status 11/17/2023 FINAL  Final  Culture, Respiratory w Gram Stain      Status: None   Collection Time: 11/17/23  2:20 PM  Result Value Ref Range Status   Specimen Description EXPECTORATED SPUTUM  Final   Special Requests Immunocompromised Reflexed from [id number]  Final   Gram Stain NO WBC SEEN FEW GRAM POSITIVE COCCI IN PAIRS   Final   Culture   Final    MODERATE Normal respiratory flora-no Staph aureus or Pseudomonas seen Performed at [facility], [address], [place], [place] [address]    Report Status 11/19/2023 FINAL  Final  Blastomyces Antigen     Status: None   Collection Time: 11/25/23  6:05 AM   Specimen: Blood  Result Value Ref Range Status   Blastomyces Antigen None Detected None Detected ng/mL Final    Comment: (NOTE) Reference Interval: None Detected Reportable Range: 0.31 ng/mL - 20.00 ng/mL Results above 20.00 ng/mL are reported as 'Positive, Above the Limit of Quantification' This test was developed and its performance characteristics determined by [employer]. It has not been cleared or approved by the FDA; however, FDA clearance or approval is not currently required for clinical use. The results are not intended to be used as the sole means for clinical diagnosis or patient decisions.    Interpretation Negative  Final   Specimen Type SERUM  Final    Comment: (NOTE) Performed At: [facility] [address] [place], [place] [id number] [place] Pac MD Ey:[id number]   MRSA Next Gen by PCR, Nasal     Status: Abnormal   Collection Time: 11/25/23 11:35 PM   Specimen: Nasal Mucosa; Nasal Swab  Result Value Ref Range Status   MRSA by PCR Next Gen DETECTED (A) NOT DETECTED Final    Comment: RESULT CALLED TO, READ BACK BY AND VERIFIED WITH: SHAFFNER,P RN 11/26/2023 AT 0218 SKEEN,P (NOTE) The GeneXpert MRSA Assay (FDA approved for NASAL specimens only), is one component of a comprehensive MRSA colonization surveillance program. It is not intended to diagnose MRSA infection nor to guide or monitor  treatment for MRSA infections. Test performance is not FDA approved in patients less than 40 years old. Performed at [facility], [address] [place], [place] [address]   Culture, Respiratory w Gram Stain     Status: None   Collection Time: 11/27/23  9:27 AM   Specimen: Bronchoalveolar Lavage; Respiratory  Result Value Ref Range Status   Specimen Description BRONCHIAL ALVEOLAR  LAVAGE  Final   Special Requests NONE  Final   Gram Stain   Final    NO WBC SEEN NO ORGANISMS SEEN Performed at [facility], [address], [place], [place] [address]    Culture RARE CANDIDA ALBICANS  Final   Report Status 11/30/2023 FINAL  Final  Acid Fast Smear (AFB)     Status: None   Collection Time: 11/27/23  9:27 AM   Specimen: Bronchoalveolar Lavage; Respiratory  Result Value Ref Range Status   AFB Specimen Processing Concentration  Final   Acid Fast Smear Negative  Final    Comment: (NOTE) Performed At: [facility] [address] [place], [place] [id number] Jennette Shorter MD Ey:[id number]    Source (AFB) BRONCHIAL ALVEOLAR LAVAGE  Final    Comment: Performed at [facility], [address], [place], [place] [address]  Fungus Culture With Stain     Status: Abnormal   Collection Time: 11/27/23  9:27 AM   Specimen: Bronchoalveolar Lavage; Respiratory  Result Value Ref Range Status   Fungus Stain Final report  Final   Fungus (Mycology) Culture Preliminary report (A)  Final    Comment: (NOTE) Performed At: [facility] [address] [place], [place] [id number] Jennette Shorter MD Ey:[id number]    Fungal Source BRONCHIAL ALVEOLAR LAVAGE  Final    Comment: Performed at [facility], [address] [place], [place] [address]  Virus culture     Status: Abnormal   Collection Time: 11/27/23  9:27 AM   Specimen: Bronchoalveolar Lavage  Result Value Ref Range Status   Viral Culture Comment (A)  Final    Comment: (NOTE) Positive Cytomegalovirus  detected by immunofluorescent monoclonal antibody staining in shell vial culture. Performed At: [facility] [address] [place], [place] [id number] Jennette Shorter MD Ey:[id number]    Source of Sample BRONCHIAL ALVEOLAR LAVAGE  Final    Comment: Performed at [facility], [address] [place], [place] [address]  Fungus Culture Result     Status: None   Collection Time: 11/27/23  9:27 AM  Result Value Ref Range Status   Result 1 Comment  Final    Comment: (NOTE) KOH/Calcofluor preparation:  no fungus observed. Performed At: [facility] [address] [place], [place] [id number] Jennette Shorter MD Ey:[id number]   Fungal organism reflex     Status: Abnormal   Collection Time: 11/27/23  9:27 AM  Result Value Ref Range Status   Fungal result 1 Candida albicans (A)  Final    Comment: (NOTE) Moderate growth Performed At: [facility] [address] [place], [place] [id number] Jennette Shorter MD Ey:[id number]   Culture, blood (Routine X 2) w Reflex to ID Panel     Status: None   Collection Time: 12/01/23 12:43 PM   Specimen: BLOOD  Result Value Ref Range Status   Specimen Description BLOOD SITE NOT SPECIFIED  Final   Special Requests   Final    BOTTLES DRAWN AEROBIC AND ANAEROBIC Blood Culture adequate volume   Culture   Final    NO GROWTH 5 DAYS Performed at [facility], [address] [place] [address] [place], [place] [address]    Report Status 12/06/2023 FINAL  Final  Culture, blood (Routine X 2) w Reflex to ID Panel     Status: None   Collection Time: 12/01/23 12:44 PM   Specimen: BLOOD  Result Value Ref Range Status   Specimen Description BLOOD SITE NOT SPECIFIED  Final  Special Requests   Final    BOTTLES DRAWN AEROBIC AND ANAEROBIC Blood Culture adequate volume   Culture   Final    NO GROWTH 5 DAYS Performed at [facility], [address] [place], [place] [address]    Report Status 12/06/2023 FINAL  Final  Culture, Respiratory w  Gram Stain     Status: None   Collection Time: 12/05/23 11:40 AM   Specimen: Bronchoalveolar Lavage; Respiratory  Result Value Ref Range Status   Specimen Description BRONCHIAL ALVEOLAR LAVAGE  Final   Special Requests NONE  Final   Gram Stain   Final    RARE WBC PRESENT, PREDOMINANTLY MONONUCLEAR NO ORGANISMS SEEN Performed at [facility], [address] [place], [place] [address]    Culture RARE CANDIDA ALBICANS  Final   Report Status 12/07/2023 FINAL  Final  Acid Fast Smear (AFB)     Status: None   Collection Time: 12/05/23 11:40 AM   Specimen: Bronchoalveolar Lavage; Respiratory  Result Value Ref Range Status   AFB Specimen Processing Concentration  Final   Acid Fast Smear Negative  Final    Comment: (NOTE) Performed At: [facility] [address] [place], [place] [id number] Jennette Shorter MD Ey:[id number]    Source (AFB) BRONCHIAL ALVEOLAR LAVAGE  Final    Comment: Performed at [facility], [address], [place], [place] [address]  Fungus Culture With Stain     Status: Abnormal (Preliminary result)   Collection Time: 12/05/23 11:40 AM   Specimen: Bronchoalveolar Lavage; Respiratory  Result Value Ref Range Status   Fungus Stain Final report (A)  Final    Comment: (NOTE) Performed At: [facility] [address] [place], [place] [id number] Jennette Shorter MD Ey:[id number]    Fungus (Mycology) Culture PENDING  Incomplete   Fungal Source BRONCHIAL ALVEOLAR LAVAGE  Final    Comment: Performed at [facility], [address] [place], [place] [address]  Fungus Culture Result     Status: Abnormal   Collection Time: 12/05/23 11:40 AM  Result Value Ref Range Status   Result 1 Comment (A)  Final    Comment: (NOTE) Fungal elements, such as arthroconidia, hyphal fragments, chlamydoconidia, observed. Performed At: [facility] [address] [place], [place] [id number] Jennette Shorter MD Ey:[id number]   Acid Fast Smear (AFB)      Status: None   Collection Time: 12/09/23 11:00 AM   Specimen: Vein; Blood  Result Value Ref Range Status   AFB Specimen Processing Concentration  Final    Comment: (NOTE) Performed At: [facility] [address] [place], [place] [id number] Jennette Shorter MD Ey:[id number]    Acid Fast Smear QNSAFB  Final    Comment: (NOTE) Test not performed. AFB Smear not performed due to specimen source (blood) or insufficient specimen.    Source (AFB) AFB SUSCEPTIBILITY  Final    Comment: Performed at [facility], [address] [place], [place] [address]  Culture, Respiratory w Gram Stain     Status: None   Collection Time: 12/09/23  4:05 PM   Specimen: Tracheal Aspirate; Respiratory  Result Value Ref Range Status   Specimen Description TRACHEAL ASPIRATE  Final   Special Requests NONE  Final   Gram Stain   Final    RARE WBC PRESENT, PREDOMINANTLY PMN FEW GRAM POSITIVE COCCI RARE GRAM NEGATIVE RODS Performed at [facility], [address], [place], [place] [address]    Culture  Final    ABUNDANT METHICILLIN RESISTANT STAPHYLOCOCCUS AUREUS   Report Status 12/11/2023 FINAL  Final   Organism ID, Bacteria METHICILLIN RESISTANT STAPHYLOCOCCUS AUREUS  Final      Susceptibility   Methicillin resistant staphylococcus aureus - MIC*    CIPROFLOXACIN >=8 RESISTANT Resistant     ERYTHROMYCIN >=8 RESISTANT Resistant     GENTAMICIN <=0.5 SENSITIVE Sensitive     OXACILLIN >=4 RESISTANT Resistant     TETRACYCLINE >=16 RESISTANT Resistant     VANCOMYCIN  1 SENSITIVE Sensitive     TRIMETH /SULFA  >=320 RESISTANT Resistant     CLINDAMYCIN  >=8 RESISTANT Resistant     RIFAMPIN <=0.5 SENSITIVE Sensitive     Inducible Clindamycin  NEGATIVE Sensitive     LINEZOLID  2 SENSITIVE Sensitive     * ABUNDANT METHICILLIN RESISTANT STAPHYLOCOCCUS AUREUS    Studies/Results: No results found.     Assessment/Plan:  INTERVAL HISTORY: Mom is decided   to to not pursue hemodialysis which I  think is the correct decision   #1 Goals of care: Mom has decided correctly in my opinion to not pursue HD and focus is now going to be on comfort care.  I met with Mom and chaplain after Dr. Claudene had had discussions with them  It is a pity he was never able to consistently take ARV which would have prevented his death at the age of 33yrs old  CRITICAL CARE Performed by: Jomarie [place]   Total critical care time: 30 minutes  Critical care time was exclusive of separately billable procedures and treating other patients.  Critical care was necessary to treat or prevent imminent or life-threatening deterioration.  Critical care was time spent personally by me on the following activities: development of treatment plan with patient and/or surrogate as well as nursing, discussions with consultants, evaluation of patient's response to treatment, examination of patient, obtaining history from patient or surrogate, ordering and performing treatments and interventions, ordering and review of laboratory studies, ordering and review of radiographic studies, pulse oximetry and re-evaluation of patient's condition.    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

## 2023-12-15 NOTE — Progress Notes (Signed)
 Brief Nutrition Note:   Pt remains on vent support via trach, poorly responsive  Worsening renal function, oliguric, no plans for RRT.  BUN 227 (H), Creatinine 6.93 (H), potassium 5.8 (H), anion gap 25 (H) CBGs <180, no hypoglycemic episodes  Noted GOC meeting held today. DNR. Noted continuing vent support with prn meds for any signs of air hunger, no escalation of current care (not treating hyperkalemia further, no addition of pressors, etc). Allowing natural death, should it occur  Per RN, continuing TF for now. Pt currently tolerating Vital AF 1.2 at 70 ml/hr, previous concern for possible malabsorption/maldigestion  Per RN diarrhea improved, no BM yet this shift, one over night, no FMS  Medications reviewed, nutrisource fiber BID  Continue current nutrition interventions for now; Vital AF 1.2 at goal, fiber for loose stool. RD to continue to follow   Betsey Finger MS, RDN, LDN, CNSC Registered Dietitian 3 Clinical Nutrition RD Inpatient Contact Info in Amion

## 2023-12-15 NOTE — Progress Notes (Signed)
   12/15/23 0742  Adult Ventilator Settings  Vent Mode (S)  PSV;CPAP  FiO2 (%) 50 %  Pressure Support 8 cmH20  PEEP 8 cmH20  Daily Weaning Assessment  Daily Assessment of Readiness to Wean Wean protocol criteria met (SBT performed)  SBT Method CPAP 5 cm H20 and PS 5 cm H20 (PS8)  Weaning Start Time 0742   Pt was placed on PS/CPAP mode and is tolerating well at this time

## 2023-12-15 NOTE — Progress Notes (Signed)
 eLink Physician-Brief Progress Note Patient Name: Delos Klich DOB: 09/28/1990 MRN: [id number]   Date of Service  12/15/2023  HPI/Events of Note  K+ 5.9, Cr 6.9  eICU Interventions  Lokelma  10 gm PO bid ordered.        Misti Towle U Antonino Nienhuis 12/15/2023, 4:31 AM

## 2023-12-15 NOTE — Progress Notes (Signed)
   12/15/23 1246  Spiritual Encounters  Type of Visit Follow up  Care provided to: Family  Reason for visit Routine spiritual support  OnCall Visit No  Spiritual Framework  Presenting Themes Coping tools;Values and beliefs;Rituals and practive;Significant life change;Impactful experiences and emotions  Community/Connection Family;Faith community  Family Stress Factors Health changes;Major life changes;Exhausted  Interventions  Spiritual Care Interventions Made Compassionate presence;Encouragement;Normalization of emotions  Intervention Outcomes  Outcomes Awareness of support;Awareness of health;Reduced anxiety;Awareness around self/spiritual resourses   Chaplain sat with Pt's mother in the waiting room as the physician provided and update on her son's condition. Mother was informed that Pt. Was given only a few days.  Chaplain remained present with her throughout the conservation, offering emotional and spiritual support. When ready, chaplain escorted her back to her son's room. A word of prayer was offered  at bedside.

## 2023-12-15 NOTE — Progress Notes (Signed)
 PT Cancellation Note  Patient Details Name: Tarrell Debes MRN: [id number] DOB: March 05, 1991   Cancelled Treatment:    Reason Eval/Treat Not Completed: Other (comment)  Has transitioned to comfort care. Acute rehab services signing-off.   Leontine Roads, PT, DPT [facility]  Rehabilitation Services Physical Therapist Office: [phone] Website: [email or web address]   Leontine GORMAN Roads 12/15/2023, 4:48 PM

## 2023-12-15 NOTE — TOC Progression Note (Signed)
 Transition of Care [facility]) - Progression Note    Patient Details  Name: Cory Russell MRN: [id number] Date of Birth: [birth year]  Transition of [facility]) CM/SW Contact  Lauraine FORBES Saa, LCSW Phone Number: 12/15/2023, 12:05 PM  Clinical Narrative:     12:05 PM Per chart review, patient has transition to comfort care. TOC will continue to follow and be available to assist.  Expected Discharge Plan: Home/Self Care Barriers to Discharge: Continued Medical Work up  Expected Discharge Plan and Services   Discharge Planning Services: CM Consult Post Acute Care Choice: NA Living arrangements for the past 2 months: Single Family Home                   DME Agency: NA       HH Arranged: NA           Social Determinants of Health (SDOH) Interventions SDOH Screenings   Food Insecurity: No Food Insecurity (11/08/2023)  Housing: Low Risk  (11/08/2023)  Transportation Needs: No Transportation Needs (11/08/2023)  Utilities: Not At Risk (11/08/2023)  Depression (PHQ2-9): Low Risk  (04/12/2023)  Financial Resource Strain: Low Risk  (02/27/2023)   Received from [facility]  Tobacco Use: Low Risk  (11/08/2023)    Readmission Risk Interventions     No data to display

## 2023-12-15 NOTE — Progress Notes (Signed)
 Palliative Medicine Progress Note   Patient Name: Cory Russell       Date: 12/15/2023 DOB: [birth year]  Age: 33 y.o. MRN#: [id number] Attending Physician: Claudene Toribio BROCKS, MD Primary Care Physician: Luiz Channel, MD Admit Date: 11/08/2023  Reason for Consultation/Follow-up: {Reason for Consult:23484}  HPI/Patient Profile: 33 y.o. male  with past medical history of HIV/AIDS, CD4<35 on admission, not on ARVs who presented to the ED with seizure activity on 11/08/2023 and is admitted with acute respiratory failure with ARDS and severe bilateral multifocal pneumonia.    He completed treated for presumed PJP pneumonia and is now being treated for possible CMV pneumonitis.   Palliative Medicine has been consulted for goals of care discussions. Patient and family are faced with anticipatory care needs and complex medical decision making.   Subjective: Chart reviewed. Per IPAL note this morning, plan for continuing vent support and allowing for natural death if it occurs  Objective:  Physical Exam Vitals reviewed.  Constitutional:      General: He is not in acute distress.    Appearance: He is ill-appearing.  Pulmonary:     Comments: Trach/vent            Vital Signs: BP (!) 104/55   Pulse (!) 104   Temp 98.7 F (37.1 C) (Oral)   Resp (!) 26   Ht 5' 7 (1.702 m)   Wt 37.5 kg   SpO2 92%   BMI 12.95 kg/m  SpO2: SpO2: 92 % O2 Device: O2 Device: Ventilator O2 Flow Rate: O2 Flow Rate (L/min): 30 L/min  Intake/output summary:  Intake/Output Summary (Last 24 hours) at 12/15/2023 1459 Last data filed at 12/15/2023 1430 Gross per 24 hour  Intake 1660.47 ml  Output 295 ml  Net 1365.47 ml    LBM: Last BM Date : 12/14/23     Palliative Assessment/Data: ***     Palliative Medicine  Assessment & Plan   Assessment: Principal Problem:   CMV pneumonia (HCC) Active Problems:   Depression   History of syphilis   Seizure disorder (HCC)   Severe sepsis due to PJP pneumonia   Protein-calorie malnutrition, severe   AIDS (HCC)   Condyloma acuminata   Acute hypoxic respiratory failure (HCC)   Hyponatremia   Hyperkalemia   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)  Pneumonia due to Pneumocystis jirovecii (HCC)   Hypoxia   Healthcare associated bacterial pneumonia   Pneumothorax   IRIS (immune reconstitution inflammatory syndrome) (HCC)   Cytomegalovirus (CMV) viremia (HCC)   Fever   Fistula   AKI (acute kidney injury) (HCC)   On mechanically assisted ventilation (HCC)    Recommendations/Plan: ***  Goals of Care and Additional Recommendations: Limitations on Scope of Treatment: {Recommended Scope and Preferences:21019}  Code Status:   Prognosis:  {Palliative Care Prognosis:23504}  Discharge Planning: {Palliative dispostion:23505}  Care plan was discussed with ***  Thank you for allowing the Palliative Medicine Team to assist in the care of this patient.   ***   Recardo KATHEE Loll, NP   Please contact Palliative Medicine Team phone at [phone] for questions and concerns.  For individual providers, please see AMION.

## 2023-12-16 DIAGNOSIS — J9601 Acute respiratory failure with hypoxia: Secondary | ICD-10-CM | POA: Diagnosis not present

## 2023-12-16 DIAGNOSIS — B250 Cytomegaloviral pneumonitis: Secondary | ICD-10-CM | POA: Diagnosis not present

## 2023-12-16 DIAGNOSIS — J9602 Acute respiratory failure with hypercapnia: Secondary | ICD-10-CM | POA: Diagnosis not present

## 2023-12-16 DIAGNOSIS — Z930 Tracheostomy status: Secondary | ICD-10-CM | POA: Diagnosis not present

## 2023-12-16 LAB — CBC
HCT: 19.3 % — ABNORMAL LOW (ref 39.0–52.0)
Hemoglobin: 6.5 g/dL — CL (ref 13.0–17.0)
MCH: 30 pg (ref 26.0–34.0)
MCHC: 33.7 g/dL (ref 30.0–36.0)
MCV: 88.9 fL (ref 80.0–100.0)
Platelets: 67 K/uL — ABNORMAL LOW (ref 150–400)
RBC: 2.17 MIL/uL — ABNORMAL LOW (ref 4.22–5.81)
RDW: 18.8 % — ABNORMAL HIGH (ref 11.5–15.5)
WBC: 2.1 K/uL — ABNORMAL LOW (ref 4.0–10.5)
nRBC: 2 % — ABNORMAL HIGH (ref 0.0–0.2)

## 2023-12-16 LAB — BASIC METABOLIC PANEL WITH GFR
Anion gap: 17 — ABNORMAL HIGH (ref 5–15)
BUN: 255 mg/dL — ABNORMAL HIGH (ref 6–20)
CO2: 31 mmol/L (ref 22–32)
Calcium: 6.8 mg/dL — ABNORMAL LOW (ref 8.9–10.3)
Chloride: 90 mmol/L — ABNORMAL LOW (ref 98–111)
Creatinine, Ser: 8.25 mg/dL — ABNORMAL HIGH (ref 0.61–1.24)
GFR, Estimated: 8 mL/min — ABNORMAL LOW (ref >60)
Glucose, Bld: 176 mg/dL — ABNORMAL HIGH (ref 70–99)
Potassium: 6.4 mmol/L (ref 3.5–5.1)
Sodium: 138 mmol/L (ref 135–145)

## 2023-12-16 LAB — AMMONIA: Ammonia: 75 umol/L — ABNORMAL HIGH (ref 9–35)

## 2023-12-16 LAB — GLUCOSE, CAPILLARY
Glucose-Capillary: 154 mg/dL — ABNORMAL HIGH (ref 70–99)
Glucose-Capillary: 166 mg/dL — ABNORMAL HIGH (ref 70–99)
Glucose-Capillary: 207 mg/dL — ABNORMAL HIGH (ref 70–99)
Glucose-Capillary: 209 mg/dL — ABNORMAL HIGH (ref 70–99)

## 2023-12-16 MED ORDER — HYDROMORPHONE HCL-NACL 50-0.9 MG/50ML-% IV SOLN
0.5000 mg/h | INTRAVENOUS | Status: DC
  Administered 2023-12-16: 1 mg/h via INTRAVENOUS
  Filled 2023-12-16: qty 50

## 2023-12-16 MED ORDER — HYDROMORPHONE HCL 1 MG/ML IJ SOLN: 1.0000 mg | Freq: Once | INTRAMUSCULAR | Status: AC

## 2023-12-16 MED ORDER — CARMEX CLASSIC LIP BALM EX OINT: TOPICAL_OINTMENT | CUTANEOUS | Status: DC | PRN

## 2023-12-16 MED ORDER — HYDROMORPHONE BOLUS VIA INFUSION: 0.2500 mg | INTRAVENOUS | Status: DC | PRN

## 2023-12-18 ENCOUNTER — Other Ambulatory Visit: Payer: Self-pay

## 2023-12-18 NOTE — Progress Notes (Signed)
 Patient was enrolled but had upcoming appointment with the clinic . We will continue with the disenrollement due to the patients passing. CNS 12/18/23

## 2023-12-19 LAB — MISC LABCORP TEST (SEND OUT): Labcorp test code: 9985

## 2023-12-28 LAB — FUNGAL ORGANISM REFLEX

## 2023-12-28 LAB — FUNGUS CULTURE WITH STAIN

## 2023-12-28 LAB — FUNGUS CULTURE RESULT

## 2024-01-01 ENCOUNTER — Ambulatory Visit: Admitting: Internal Medicine

## 2024-01-02 ENCOUNTER — Other Ambulatory Visit (HOSPITAL_COMMUNITY): Payer: Self-pay

## 2024-01-04 LAB — FUNGUS CULTURE RESULT

## 2024-01-04 LAB — FUNGUS CULTURE WITH STAIN

## 2024-01-04 LAB — FUNGAL ORGANISM REFLEX

## 2024-01-05 NOTE — Progress Notes (Signed)
 OT Cancellation Note  Patient Details Name: Glenford Garis MRN: [id number] DOB: 1990/08/19   Cancelled Treatment:    Reason Eval/Treat Not Completed: Other (comment) (OT will s/o as pt transitioning to comfort care.)  Rayne Loiseau K, OTD, OTR/L [employer] Preferred Acute Rehab [phone]   Laneta POUR Koonce 12/11/2023, 7:02 AM

## 2024-01-05 NOTE — Progress Notes (Signed)
 Time of death called with Vertell Pellant, RN at [phone].

## 2024-01-05 NOTE — Death Summary Note (Signed)
 DEATH SUMMARY   Patient Details  Name: Cory Russell MRN: [id number] DOB: Nov 12, 1990  Admission/Discharge Information   Admit Date:  December 07, 2023  Date of Death: Date of Death: 01-14-2024  Time of Death: Time of Death: 1457-01-31  Length of Stay: 01-31-2037  Referring Physician: Luiz Channel, MD   Reason(s) for Hospitalization  Seizure like activity   Diagnoses  Preliminary cause of death: acute renal failure  Secondary Diagnoses (including complications and co-morbidities):  Principal Problem:   CMV pneumonia (HCC) Active Problems:   Depression   History of syphilis   Seizure disorder (HCC)   Severe sepsis due to PJP pneumonia   Protein-calorie malnutrition, severe   AIDS (HCC)   Condyloma acuminata   Acute hypoxic respiratory failure (HCC)   Hyponatremia   Hyperkalemia   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)   Pneumonia due to Pneumocystis jirovecii (HCC)   Hypoxia   Healthcare associated bacterial pneumonia   Pneumothorax   IRIS (immune reconstitution inflammatory syndrome) (HCC)   Cytomegalovirus (CMV) viremia (HCC)   Fever   Fistula   AKI (acute kidney injury) (HCC)   On mechanically assisted ventilation (HCC) Medication non-compliance  ARDS  MRSA pneumonia  Tracheostomy dependent  Anemia Physical debility  History of syphilis Hypokalemia Hyponatremia  Cachexia  DNR status   Brief Hospital Course (including significant findings, care, treatment, and services provided and events leading to death)  Cory Russell is a 33 y.o. year old male who was admitted 2023-12-07 after presenting with seizure like activity.  His course was complicated and outlined below   6/4 admitted to [facility] after seizure like activity. Persistent PNA. high concern for JPJ PNA. ID consulted. Neurology consulted.   6/4-6/14 progressive hypoxia despite abx and steroids 6/15 PCCM consulted, bronch deferred given high risk with high O2 requirements. Concern for IRIS  6/18 prednisone   changed to solumedrol  6/20 rapid response, patient wanted to avoid ICU 6/21 rapid response. Transferred to ICU  6/22 intubated, art line placed. Developed ARDS  6/23 iNO for ARDS. Bronch / BAL. Chest tube for pneumothorax. CMV in viral c    6/24 off iNO 6/25 NMB for vent dyssynchrony.  Worse ptx, second chest tube placed  6/26 PTX did not improve with second pigtail. Upsized to large bore. Discussions of trach started, mother was unsure prompting palliative care consult 7/1 trach.  7/2 weaning sedation  7/3 PSV vent weaning  7/5 spiked fever  7/6 MRSA PNA, vanc  7/7 1 PRBC, possible transfusion reaction  7/8 declining renal function, vanc changed to zyvox   7/9 family meeting regarding worse renal failure possible HD. Mom hopeful for improvement  7/10 worse renal function. Palliative care saw again. Wanted time to process and to decide re HD next day.   7/11 Decision not to pursue HD.  No pressors.No escalation of care. DNR.   14-Jan-2024 Time of death 01-14-2024 14:58  Pertinent Labs and Studies  Significant Diagnostic Studies DG CHEST PORT 1 VIEW Result Date: 12/13/2023 CLINICAL DATA:  [id number]. Chest tube in place. Follow-up airspace disease. EXAM: PORTABLE CHEST 1 VIEW COMPARISON:  Portable chest yesterday at 9:20 a.m. FINDINGS: 5:15 a.m. Tracheostomy tip remains 4.3 cm from the carina. Feeding tube well inside the stomach with the radiopaque tip out of view. There is a minimally small right apical pneumothorax, unchanged. There is extensive bilateral chest wall emphysema. Heart is slightly enlarged with prominence in the central vessels. There is right greater the left perihilar interstitial and airspace the disease consistent  with edema, pneumonia or combination. There are minimal pleural effusions. There is no new or worsening airspace disease or measurable left pneumothorax. Left chest tube positioning is unaltered. The mediastinum is stable. No new osseous findings. Overall aeration is not  significantly changed.  No new abnormality. IMPRESSION: 1. No significant change in aeration. 2. Minimally small right apical pneumothorax, unchanged. 3. Extensive bilateral chest wall emphysema. 4. Right greater than left perihilar interstitial and airspace disease consistent with edema, pneumonia or combination. 5. Minimal pleural effusions. 6. Stable support apparatus. Electronically Signed   By: Francis Quam M.D.   On: 12/13/2023 07:04   DG CHEST PORT 1 VIEW Result Date: 12/12/2023 CLINICAL DATA:  HIV, chest crepitus EXAM: PORTABLE CHEST 1 VIEW COMPARISON:  12/12/2023 FINDINGS: Tracheostomy, nasoenteric feeding tube extending into the upper abdomen beyond the margin of the examination, and left chest tube are unchanged. Perihilar consolidation appears mildly progressive, likely infectious or inflammatory. Stable tiny right apical pneumothorax versus subpleural gas. No pneumothorax on the left. No pleural effusion. Cardiac size within normal limits. Extensive chest wall subcutaneous gas again noted, grossly stable IMPRESSION: 1. Stable support tubes. 2. Stable tiny right apical pneumothorax versus subpleural gas. 3. Progressive perihilar consolidation, likely infectious or inflammatory. 4. Left chest tube in place. No pneumothorax. Stable extensive chest wall subcutaneous gas. Electronically Signed   By: Dorethia Molt M.D.   On: 12/12/2023 09:58   DG CHEST PORT 1 VIEW Result Date: 12/12/2023 CLINICAL DATA:  Hypoxemia. Chest tube, tracheostomy, airspace densities. EXAM: PORTABLE CHEST 1 VIEW COMPARISON:  12/11/2023 FINDINGS: A tracheostomy tube is present with tip measuring 4.3 cm above the carina. An enteric feeding tube is present with tip projecting over the right upper quadrant consistent with location in the distal stomach or proximal duodenum. A left chest tube is in place without change in position since prior study. Slightly shallow inspiration. Cardiac enlargement. Perihilar infiltrates, greater on  the right demonstrate mild progression. No pleural effusion or pneumothorax is seen. Prominent subcutaneous emphysema in the neck and upper chest. Mild upper pneumo mediastinum. IMPRESSION: 1. Appliances appear in satisfactory position. 2. Cardiac enlargement. 3. Perihilar infiltrates, greater on the right, with mild progression since prior study. 4. Left chest tube remains in place without visible residual pneumothorax. 5. Extensive subcutaneous emphysema with mild pneumo mediastinum, similar to prior study. Electronically Signed   By: [name] Gravely M.D.   On: 12/12/2023 00:53   DG CHEST PORT 1 VIEW Result Date: 12/11/2023 CLINICAL DATA:  Acute on chronic respiratory failure EXAM: PORTABLE CHEST 1 VIEW COMPARISON:  Prior chest x-ray 12/08/2023 FINDINGS: Tracheostomy tube in place. The tip is midline and at the level of the clavicles. A feeding tube is present. The tip of the tube overlies the pylorus. Left-sided chest tube in unchanged position. Similar appearance of diffuse subcutaneous emphysema which is perhaps slightly progressed. Probable pneumomediastinum. No residual pneumothorax visualized. Decreased inspiratory volumes with increased perihilar streaky airspace opacities likely reflecting atelectasis. IMPRESSION: 1. Increasing subcutaneous emphysema and pneumomediastinum. 2. No evidence of residual pneumothorax. 3. Decreased lung volumes with increasing patchy airspace opacities in the right greater than left perihilar region. Airspace opacities may reflect areas of atelectasis, pneumonia or simply increased conspicuity of ARDS in the setting of lower lung volumes. Electronically Signed   By: Wilkie Lent M.D.   On: 12/11/2023 07:39   DG CHEST PORT 1 VIEW Result Date: 12/08/2023 CLINICAL DATA:  Chest tube EXAM: PORTABLE CHEST 1 VIEW COMPARISON:  12/06/2023 FINDINGS: Tracheostomy tube again noted.  Left chest tube remains in place with subcutaneous gas in the supraclavicular region bilaterally and  associated pneumomediastinum. Possible trace left apical pneumothorax versus mediastinal gas. Patchy hazy airspace disease overlies both upper lungs, similar to prior. No substantial pleural effusion. A feeding tube passes into the stomach although the distal tip position is not included on the film. Telemetry leads overlie the chest. IMPRESSION: 1. Left chest tube remains in place with associated pneumomediastinum and subcutaneous gas in the supraclavicular region bilaterally. Possible trace left apical pneumothorax versus mediastinal gas. 2. Patchy hazy airspace disease overlies both upper lungs, similar to prior. Electronically Signed   By: Camellia Candle M.D.   On: 12/08/2023 07:47   DG CHEST PORT 1 VIEW Result Date: 12/06/2023 CLINICAL DATA:  Chest tube placement. EXAM: PORTABLE CHEST 1 VIEW COMPARISON:  X-ray 12/05/2023 and older. FINDINGS: Stable tracheostomy tube, enteric tube and left chest tube. There continues to be a left-sided pneumothorax, similar to previous. Bilateral chest wall gas again seen, increasing in the left axillary region. Stable enlarged cardiopericardial silhouette with some prominence of the central vasculature. Some interstitial prominence also noted. No pleural effusion. Overlapping cardiac leads. No right-sided pneumothorax. Pneumomediastinum. IMPRESSION: Stable tubes and lines. This includes left chest tube. Left-sided pneumothorax is again seen and similar to previous when adjusted for technique. Increasing chest wall gas on the left.  Pneumomediastinum. Electronically Signed   By: Ranell Bring M.D.   On: 12/06/2023 16:13   DG Chest Port 1 View Result Date: 12/05/2023 CLINICAL DATA:  [id number] Status post tracheostomy [facility]) [id number] EXAM: PORTABLE CHEST - 1 VIEW COMPARISON:  December 05, 2023 FINDINGS: Interval removal of the endotracheal tube with placement of a tracheostomy tube, which is well-positioned in the midline trachea at the thoracic inlet. Similar hazy airspace opacities  throughout both lungs. No pleural effusion or pneumothorax. Left chest tube is similar position terminating in the left hilar region. Weighted feeding tube courses below the diaphragm with the distal tip not included in the field of view. Unchanged supraclavicular subcutaneous emphysema with extensive pneumomediastinum. IMPRESSION: 1. Similar extensive pneumomediastinum and subcutaneous emphysema in the supraclavicular regions bilaterally. If not previously evaluated, a follow-up chest CT may be of benefit for further characterization. 2. Well-positioned tracheostomy tube, which has been placed in the interim. 3. Little significant change to the lungs.  No pneumothorax. These results will be called to the ordering clinician or representative by the Radiologist Assistant and communication documented in the PACS or [employer]. Electronically Signed   By: Rogelia Myers M.D.   On: 12/05/2023 12:27   DG Chest Port 1 View Result Date: 12/05/2023 CLINICAL DATA:  Ventilator dependence. EXAM: PORTABLE CHEST 1 VIEW COMPARISON:  12/01/2023 FINDINGS: Endotracheal tube tip is approximately 4.3 cm above the base of the carina. Feeding tube tip is positioned in the pyloric region, directed distally. Left chest tube noted without discernible pneumothorax. Cardiopericardial silhouette is at upper limits of normal for size. Interval improvement in the diffuse bilateral hazy lung opacities seen previously. Telemetry leads overlie the chest. New soft tissue gas is seen in the lower neck and supraclavicular regions bilaterally with findings suspicious for pneumomediastinum. IMPRESSION: 1. Interval development of soft tissue gas in the lower neck and supraclavicular regions with evidence of pneumomediastinum. CT imaging of the chest could be used to further evaluate as clinically warranted. 2. Interval improvement in diffuse bilateral hazy lung opacities seen previously. 3. Left chest tube without discernible pneumothorax.  These results will be called to the ordering clinician  or representative by the Radiologist Assistant, and communication documented in the PACS or [employer]. Electronically Signed   By: Camellia Candle M.D.   On: 12/05/2023 08:15   DG CHEST PORT 1 VIEW Result Date: 12/01/2023 CLINICAL DATA:  [id number] Hypoxia 200808, endotracheal tube adjustment EXAM: PORTABLE CHEST 1 VIEW COMPARISON:  Chest radiograph from earlier today. FINDINGS: Endotracheal tube tip is 3.2 cm above the carina. Weighted enteric tube tip in the distal stomach in the region of the pylorus in the medial right abdomen. Esophageal temperature probe terminates in the mid esophagus. Stable left chest tube terminating over the mid left lung. Stable cardiomediastinal silhouette with normal heart size. Stable tiny left apical pneumothorax. No right pneumothorax. No pleural effusion. Similar diffuse hazy lung opacities in both lungs. IMPRESSION: 1. Well positioned support structures. Endotracheal tube tip is 3.2 cm above the carina. 2. Stable tiny left apical pneumothorax with left chest tube in place. 3. Similar diffuse hazy lung opacities in both lungs. Electronically Signed   By: Selinda DELENA Blue M.D.   On: 12/01/2023 14:13   DG Chest Port 1 View Result Date: 12/01/2023 CLINICAL DATA:  Pneumothorax. EXAM: PORTABLE CHEST 1 VIEW COMPARISON:  November 30, 2023. FINDINGS: Stable cardiomediastinal silhouette. Endotracheal tube is unchanged. Feeding tube is seen entering stomach. Stable left-sided chest tube with minimal left apical pneumothorax. Stable bilateral lung opacities concerning for edema or inflammation. Bony thorax is unremarkable. IMPRESSION: Stable support apparatus. Stable left-sided chest tube with minimal left apical pneumothorax. Stable bilateral lung opacities. Electronically Signed   By: Lynwood Landy Raddle M.D.   On: 12/01/2023 08:27   DG CHEST PORT 1 VIEW Result Date: 11/30/2023 CLINICAL DATA:  Chest tube in place. EXAM: PORTABLE CHEST 1  VIEW COMPARISON:  Same day. FINDINGS: Two left-sided pigtail catheters are noted in the left hemithorax. Large left pneumothorax is noted. Diffuse right lung opacity is noted. IMPRESSION: Large left pneumothorax is noted which is increased compared to prior exam. 2 pigtail catheters are noted in left hemithorax. Subcutaneous emphysema is seen overlying left lateral chest wall. Electronically Signed   By: Lynwood Landy Raddle M.D.   On: 11/30/2023 11:33   DG CHEST PORT 1 VIEW Result Date: 11/30/2023 CLINICAL DATA:  Chest tube placement. EXAM: PORTABLE CHEST 1 VIEW COMPARISON:  Same day. FINDINGS: Interval placement of large bore left-sided chest tube with nearly complete resolution left pneumothorax. Subcutaneous emphysema is noted over left lateral chest wall. IMPRESSION: Interval placement of left-sided large-bore chest tube with nearly complete resolution of left pneumothorax. Electronically Signed   By: Lynwood Landy Raddle M.D.   On: 11/30/2023 11:26   DG CHEST PORT 1 VIEW Result Date: 11/30/2023 CLINICAL DATA:  Left chest tube placement/ reposition EXAM: PORTABLE CHEST 1 VIEW COMPARISON:  Chest x-ray 11/29/2023 FINDINGS: Endotracheal tube with tip terminating 4-5 cm above the carina. Enteric tube courses below the hemidiaphragm with tip overlying the expected region of the gastric antrum/pylorus. Gastric lumen partially visualized with PO contrast. Two left chest tubes noted with pigtails overlying the left hemidiaphragms. The heart and mediastinal contours are within normal limits. Improved aeration of the bilateral lung with persistent diffuse patchy airspace in the cysto opacities. No pulmonary edema. No pleural effusion. Interval decrease in size of a moderate volume left pneumothorax. No right pneumothorax. No acute osseous abnormality. IMPRESSION: 1. Interval decrease in size of a moderate volume left pneumothorax. 2. Improved aeration of the bilateral lung with persistent diffuse patchy airspace in the cysto  opacities. 3. Lines and  tubes as above. Electronically Signed   By: Morgane  Naveau M.D.   On: 11/30/2023 01:20   DG Chest Port 1 View Result Date: 11/29/2023 CLINICAL DATA:  Respiratory distress EXAM: PORTABLE CHEST 1 VIEW COMPARISON:  11/29/2023 FINDINGS: Left chest tube remains in place. Worsening left pneumothorax, now large in size. Endotracheal tube is unchanged. Diffuse airspace disease throughout the right lung. Heart and mediastinal contours are within normal limits. IMPRESSION: Left chest tube remains in stable position. Enlarging left pneumothorax, now large in size. Diffuse airspace disease throughout the right lung. These results will be called to the ordering clinician or representative by the Radiologist Assistant, and communication documented in the PACS or [employer]. Electronically Signed   By: Franky Crease M.D.   On: 11/29/2023 22:54    Microbiology No results found for this or any previous visit (from the past 240 hours).  Lab Basic Metabolic Panel: No results for input(s): NA, K, CL, CO2, GLUCOSE, BUN, CREATININE, CALCIUM , MG, PHOS in the last 168 hours. Liver Function Tests: No results for input(s): AST, ALT, ALKPHOS, BILITOT, PROT, ALBUMIN in the last 168 hours. No results for input(s): LIPASE, AMYLASE in the last 168 hours. No results for input(s): AMMONIA in the last 168 hours. CBC: No results for input(s): WBC, NEUTROABS, HGB, HCT, MCV, PLT in the last 168 hours. Cardiac Enzymes: No results for input(s): CKTOTAL, CKMB, CKMBINDEX, TROPONINI in the last 168 hours. Sepsis Labs: No results for input(s): PROCALCITON, WBC, LATICACIDVEN in the last 168 hours.  Procedures/Operations  11/08/23 EEG 6/22 intubation 6/22 arterial line  6/23 cortrak  6/23 bronch 6/23 chest tube  6/26 second chest tube. Subsequent upsize to surgical large bore chest tube and removal of pigtail x 2  7/1 bronch, trach      Ronnald FORBES Gave 12/29/2023, 5:20 PM

## 2024-01-05 NOTE — Progress Notes (Signed)
 NAME:  Cory Russell, MRN:  [id number], DOB:  Apr 28, 1991, LOS: 38 ADMISSION DATE:  11/08/2023, CONSULTATION DATE: 11/19/2023 REFERRING MD:  J[facility] - [facility], CHIEF COMPLAINT:  Respiratory failure   History of Present Illness:  Cory Russell is a 33 y.o. man with past medical history of HIV/AIDS, CD4<35 on admission, not on ARVs. Presents with seizure like activity 11 days ago and found to have sepsis from pneumonia.  He notes symptoms started about 2-3 weeks prior to admit with shortness of breath and dry cough. He had two separate rounds of antibiotics visiting other urgent care and ED visits. He has tried albuterol  nebulizer treatments from friends and initially found them helpful. CT Chest shows bilateral ground glass opacities concerning for pneumoncystis pneumonia. Since being here he has been Started on empiric antibiotics with bactrim  and steroids. Also resumed ARVs during this admission. Has been persistently hypoxemic on 10LNC. Pulmonary consulted to assist with evaluation and management and for bronchoscopy.   He feels persistently short of breath, worse with exertion. He feels like chest PT is helping him. Feeling more anxious. Also having difficulty taking pills.   Pertinent Medical History:  HIV, Seizure disorder  Significant Hospital Events: Including procedures, antibiotic start and stop dates in addition to other pertinent events   6/04 Admit 6/15 Pulmonary consult 6/18 Transient desats with worsening chest x-ray.  Prednisone  changed to IV Solu-Medrol  6/22 Worsening respiratory status requiring ICU transfer and intubation 6/23 Bronch for BAL per ID request. NO being weaned from 20 to 15. Overnight L PTX s/p chest tube. 6/25 Required transition to SIMV vent mode due to dyssynchrony. Sedation increased. Paralytic push given. CXR improved s/p diuresis. Required secondary L pigtail for persistent PTX.  6/26 Large bore chest tube placed, pigtail removed. 6/30 discussed plan for trach.  Weaning sedation  7/1 trach 7/2 weaning sedation. Chest tube inadvertently malfunctioned.   7/3 weaning sedation, vent  7/4 CMV viral load came back higher than before, question of resistance.  Ganciclovir  stopped by ID.  Gets tachypneic with dysfunctional breathing trial 7/5 spiked fever with Tmax 102.3, remained tachycardic in 120s  Interim History / Subjective:  Off sedation Air hungry Family at bedside  Objective:   Blood pressure (!) 98/43, pulse 96, temperature 99.4 F (37.4 C), temperature source Oral, resp. rate (!) 31, height 5' 7 (1.702 m), weight 38.9 kg, SpO2 90%.    Vent Mode: SIMV;PRVC;PSV FiO2 (%):  [50 %] 50 % Set Rate:  [18 bmp] 18 bmp Vt Set:  [0 mL-410 mL] 410 mL PEEP:  [8 cmH20] 8 cmH20 Pressure Support:  [8 cmH20] 8 cmH20 Plateau Pressure:  [21 cmH20-23 cmH20] 21 cmH20   Intake/Output Summary (Last 24 hours) at 12/26/2023 0807 Last data filed at 12/08/2023 0300 Gross per 24 hour  Intake 1085.67 ml  Output 165 ml  Net 920.67 ml   Filed Weights   12/12/23 0600 12/15/23 0623 12/29/2023 0500  Weight: 37.3 kg 37.5 kg 38.9 kg   Physical Examination: Agonal breathing Unresponsive Lungs with rhonci Ext warm Heart tachy + accessory muscle use  Patient Lines/Drains/Airways Status     Active Line/Drains/Airways     Name Placement date Placement time Site Days   Peripheral IV 12/10/23 20 G 1 Left;Posterior Forearm 12/10/23  1133  Forearm  6   Chest Tube 1 Lateral;Left Pleural 32 Fr. 11/30/23  1130  Pleural  16   Urethral Catheter Katie M RN Latex 16 Fr. 12/13/23  1015  Latex  3   Small Bore  Feeding Tube 10 Fr. Left nare Marking at nare/corner of mouth 73 cm 11/27/23  1210  Left nare  19   Tracheostomy Shiley Flexible 6 mm Cuffed 12/05/23  1149  6 mm  11   Wound 12/13/23 0800 Irritant Contact Dermatitis Buttocks Right 12/13/23  0800  Buttocks  3             Resolved Hospital problems  Probable severe PJP pneumonia Sepsis due to bilateral  superimposed bacterial pneumonia with ARDS Acute septic encephalopathy Hyponatremia Hyperkalemia   Assessment and Plan:  Acute respiratory failure with hypoxia and hypercapnia status post trach Left-sided pneumothorax and pneumomediastinum with subcutaneous emphysema status post chest tube MRSA pneumonia PJP pneumonia at presentation Sepsis due to recurrent bilateral multifocal pneumonia, now with MRSA HIV AIDS Probable IRIS> potential source of ongoing fevers CMV viremia, question possible pneumonitis Leukopenia without obvious cause-- would be very early to be a side effect of linezolid , and no significant thrombocytopenia AKI due to septic ATN> worsening Sinus tachycardia due to infection Cachexia, severe protein calorie malnutrition Seizure disorder-con't depakote  Anemia of critical illness Debility due to critical illness associated weakness  - Continue vent support - Teflaro  as ordered, appreciate ID help  to switch depakene  - See IPAL note 7/11, anticipate he will pass from renal failure; he appears to be struggling, discussed with mother, we will start dilaudid  gtt to ease WOB, patient is DNR, anticipate passing within next 24-48 hours, no escalation  31 min cc time Rolan Sharps MD PCCM

## 2024-01-05 DEATH — deceased

## 2024-01-09 LAB — ACID FAST CULTURE WITH REFLEXED SENSITIVITIES (MYCOBACTERIA): Acid Fast Culture: NEGATIVE

## 2024-01-18 LAB — ACID FAST CULTURE WITH REFLEXED SENSITIVITIES (MYCOBACTERIA): Acid Fast Culture: NEGATIVE

## 2024-01-22 LAB — ACID FAST CULTURE WITH REFLEXED SENSITIVITIES (MYCOBACTERIA): Acid Fast Culture: NEGATIVE
